# Patient Record
Sex: Female | Born: 1945 | Race: White | Hispanic: No | Marital: Married | Smoking: Current some day smoker
Health system: Southern US, Community
[De-identification: ages and names within clinical notes are randomized; demographics above are authoritative.]

## PROBLEM LIST (undated history)

## (undated) ENCOUNTER — Emergency Department (HOSPITAL_COMMUNITY): Discharge status: Absent without leave

## (undated) DIAGNOSIS — R29898 Other symptoms and signs involving the musculoskeletal system: Secondary | ICD-10-CM

## (undated) DIAGNOSIS — G8929 Other chronic pain: Secondary | ICD-10-CM

## (undated) DIAGNOSIS — I350 Nonrheumatic aortic (valve) stenosis: Secondary | ICD-10-CM

## (undated) DIAGNOSIS — I872 Venous insufficiency (chronic) (peripheral): Secondary | ICD-10-CM

## (undated) DIAGNOSIS — R0989 Other specified symptoms and signs involving the circulatory and respiratory systems: Secondary | ICD-10-CM

## (undated) DIAGNOSIS — M1990 Unspecified osteoarthritis, unspecified site: Secondary | ICD-10-CM

## (undated) DIAGNOSIS — R162 Hepatomegaly with splenomegaly, not elsewhere classified: Secondary | ICD-10-CM

## (undated) DIAGNOSIS — M48061 Spinal stenosis, lumbar region without neurogenic claudication: Secondary | ICD-10-CM

## (undated) DIAGNOSIS — B379 Candidiasis, unspecified: Secondary | ICD-10-CM

## (undated) DIAGNOSIS — J9690 Respiratory failure, unspecified, unspecified whether with hypoxia or hypercapnia: Secondary | ICD-10-CM

## (undated) DIAGNOSIS — D696 Thrombocytopenia, unspecified: Secondary | ICD-10-CM

## (undated) DIAGNOSIS — I739 Peripheral vascular disease, unspecified: Secondary | ICD-10-CM

## (undated) DIAGNOSIS — J449 Chronic obstructive pulmonary disease, unspecified: Secondary | ICD-10-CM

## (undated) DIAGNOSIS — R7881 Bacteremia: Secondary | ICD-10-CM

## (undated) DIAGNOSIS — I509 Heart failure, unspecified: Secondary | ICD-10-CM

## (undated) DIAGNOSIS — I5032 Chronic diastolic (congestive) heart failure: Secondary | ICD-10-CM

## (undated) DIAGNOSIS — F329 Major depressive disorder, single episode, unspecified: Secondary | ICD-10-CM

## (undated) DIAGNOSIS — E119 Type 2 diabetes mellitus without complications: Secondary | ICD-10-CM

## (undated) DIAGNOSIS — E876 Hypokalemia: Secondary | ICD-10-CM

## (undated) DIAGNOSIS — I10 Essential (primary) hypertension: Secondary | ICD-10-CM

## (undated) DIAGNOSIS — I469 Cardiac arrest, cause unspecified: Secondary | ICD-10-CM

## (undated) DIAGNOSIS — E039 Hypothyroidism, unspecified: Secondary | ICD-10-CM

## (undated) DIAGNOSIS — Z515 Encounter for palliative care: Secondary | ICD-10-CM

## (undated) DIAGNOSIS — K7460 Unspecified cirrhosis of liver: Secondary | ICD-10-CM

## (undated) DIAGNOSIS — Z9889 Other specified postprocedural states: Secondary | ICD-10-CM

## (undated) DIAGNOSIS — R609 Edema, unspecified: Secondary | ICD-10-CM

## (undated) DIAGNOSIS — F32A Depression, unspecified: Secondary | ICD-10-CM

## (undated) HISTORY — DX: Thrombocytopenia, unspecified: D69.6

## (undated) HISTORY — DX: Unspecified osteoarthritis, unspecified site: M19.90

## (undated) HISTORY — DX: Other specified symptoms and signs involving the circulatory and respiratory systems: R09.89

## (undated) HISTORY — PX: ROTATOR CUFF REPAIR: SHX139

## (undated) HISTORY — DX: Respiratory failure, unspecified, unspecified whether with hypoxia or hypercapnia: J96.90

## (undated) HISTORY — PX: THYROIDECTOMY: SHX17

## (undated) HISTORY — DX: Chronic obstructive pulmonary disease, unspecified: J44.9

## (undated) HISTORY — DX: Nonrheumatic aortic (valve) stenosis: I35.0

## (undated) HISTORY — DX: Major depressive disorder, single episode, unspecified: F32.9

## (undated) HISTORY — PX: LAMINECTOMY: SHX219

## (undated) HISTORY — DX: Depression, unspecified: F32.A

## (undated) HISTORY — PX: VAGINAL HYSTERECTOMY: SUR661

## (undated) HISTORY — PX: LITHOTRIPSY: SUR834

## (undated) HISTORY — DX: Hypokalemia: E87.6

## (undated) HISTORY — DX: Edema, unspecified: R60.9

## (undated) HISTORY — DX: Venous insufficiency (chronic) (peripheral): I87.2

## (undated) HISTORY — PX: BREAST CYST EXCISION: SHX579

## (undated) HISTORY — PX: LAPAROSCOPIC CHOLECYSTECTOMY: SUR755

## (undated) HISTORY — DX: Hypothyroidism, unspecified: E03.9

---

## 1997-11-12 ENCOUNTER — Ambulatory Visit (HOSPITAL_COMMUNITY): Admission: RE | Admit: 1997-11-12 | Discharge: 1997-11-12 | Payer: Self-pay | Admitting: Orthopedic Surgery

## 1997-11-12 ENCOUNTER — Encounter: Payer: Self-pay | Admitting: Orthopedic Surgery

## 1997-11-29 ENCOUNTER — Ambulatory Visit (HOSPITAL_COMMUNITY): Admission: RE | Admit: 1997-11-29 | Discharge: 1997-11-29 | Payer: Self-pay | Admitting: Orthopedic Surgery

## 1997-11-29 ENCOUNTER — Encounter: Payer: Self-pay | Admitting: Orthopedic Surgery

## 1997-12-20 ENCOUNTER — Ambulatory Visit (HOSPITAL_BASED_OUTPATIENT_CLINIC_OR_DEPARTMENT_OTHER): Admission: RE | Admit: 1997-12-20 | Discharge: 1997-12-20 | Payer: Self-pay | Admitting: Orthopedic Surgery

## 1998-01-02 ENCOUNTER — Encounter: Admission: RE | Admit: 1998-01-02 | Discharge: 1998-03-15 | Payer: Self-pay | Admitting: Orthopedic Surgery

## 1998-04-12 ENCOUNTER — Encounter: Payer: Self-pay | Admitting: Neurosurgery

## 1998-04-16 ENCOUNTER — Encounter: Payer: Self-pay | Admitting: Neurosurgery

## 1998-04-16 ENCOUNTER — Inpatient Hospital Stay (HOSPITAL_COMMUNITY): Admission: RE | Admit: 1998-04-16 | Discharge: 1998-04-17 | Payer: Self-pay | Admitting: Neurosurgery

## 1998-04-30 ENCOUNTER — Inpatient Hospital Stay (HOSPITAL_COMMUNITY): Admission: AD | Admit: 1998-04-30 | Discharge: 1998-05-02 | Payer: Self-pay | Admitting: Neurosurgery

## 1998-05-01 ENCOUNTER — Encounter: Payer: Self-pay | Admitting: Neurosurgery

## 1998-05-02 ENCOUNTER — Encounter: Payer: Self-pay | Admitting: Neurosurgery

## 1998-06-10 ENCOUNTER — Inpatient Hospital Stay (HOSPITAL_COMMUNITY): Admission: EM | Admit: 1998-06-10 | Discharge: 1998-06-21 | Payer: Self-pay | Admitting: Emergency Medicine

## 1998-06-14 ENCOUNTER — Encounter: Payer: Self-pay | Admitting: *Deleted

## 1998-06-16 ENCOUNTER — Encounter: Payer: Self-pay | Admitting: *Deleted

## 1998-08-02 ENCOUNTER — Ambulatory Visit (HOSPITAL_COMMUNITY): Admission: RE | Admit: 1998-08-02 | Discharge: 1998-08-02 | Payer: Self-pay | Admitting: Family Medicine

## 1998-09-19 ENCOUNTER — Encounter: Admission: RE | Admit: 1998-09-19 | Discharge: 1998-12-12 | Payer: Self-pay | Admitting: Anesthesiology

## 1998-11-11 ENCOUNTER — Ambulatory Visit (HOSPITAL_COMMUNITY): Admission: RE | Admit: 1998-11-11 | Discharge: 1998-11-11 | Payer: Self-pay | Admitting: Anesthesiology

## 1998-11-11 ENCOUNTER — Encounter: Payer: Self-pay | Admitting: Anesthesiology

## 1998-12-12 ENCOUNTER — Encounter: Admission: RE | Admit: 1998-12-12 | Discharge: 1999-03-08 | Payer: Self-pay | Admitting: Anesthesiology

## 1998-12-17 ENCOUNTER — Encounter: Admission: RE | Admit: 1998-12-17 | Discharge: 1998-12-17 | Payer: Self-pay | Admitting: Gastroenterology

## 1998-12-17 ENCOUNTER — Encounter: Payer: Self-pay | Admitting: Gastroenterology

## 1999-02-01 ENCOUNTER — Other Ambulatory Visit: Admission: RE | Admit: 1999-02-01 | Discharge: 1999-02-01 | Payer: Self-pay | Admitting: Gastroenterology

## 1999-03-08 ENCOUNTER — Encounter: Admission: RE | Admit: 1999-03-08 | Discharge: 1999-05-10 | Payer: Self-pay | Admitting: Anesthesiology

## 1999-04-12 ENCOUNTER — Encounter: Admission: RE | Admit: 1999-04-12 | Discharge: 1999-04-12 | Payer: Self-pay | Admitting: *Deleted

## 1999-06-10 ENCOUNTER — Encounter: Payer: Self-pay | Admitting: Surgery

## 1999-06-11 ENCOUNTER — Encounter: Admission: RE | Admit: 1999-06-11 | Discharge: 1999-09-09 | Payer: Self-pay | Admitting: Anesthesiology

## 1999-06-12 ENCOUNTER — Encounter (INDEPENDENT_AMBULATORY_CARE_PROVIDER_SITE_OTHER): Payer: Self-pay | Admitting: Specialist

## 1999-06-12 ENCOUNTER — Encounter: Payer: Self-pay | Admitting: Surgery

## 1999-06-13 ENCOUNTER — Encounter: Payer: Self-pay | Admitting: Surgery

## 1999-06-13 ENCOUNTER — Inpatient Hospital Stay (HOSPITAL_COMMUNITY): Admission: EM | Admit: 1999-06-13 | Discharge: 1999-06-15 | Payer: Self-pay | Admitting: Surgery

## 1999-09-09 ENCOUNTER — Encounter: Admission: RE | Admit: 1999-09-09 | Discharge: 1999-12-08 | Payer: Self-pay | Admitting: Anesthesiology

## 1999-09-17 ENCOUNTER — Encounter: Admission: RE | Admit: 1999-09-17 | Discharge: 1999-09-17 | Payer: Self-pay | Admitting: Family Medicine

## 1999-09-17 ENCOUNTER — Encounter: Payer: Self-pay | Admitting: Family Medicine

## 1999-12-03 ENCOUNTER — Encounter: Admission: RE | Admit: 1999-12-03 | Discharge: 2000-03-02 | Payer: Self-pay | Admitting: Anesthesiology

## 2000-03-26 ENCOUNTER — Encounter: Admission: RE | Admit: 2000-03-26 | Discharge: 2000-06-24 | Payer: Self-pay | Admitting: Anesthesiology

## 2000-08-10 ENCOUNTER — Encounter: Admission: RE | Admit: 2000-08-10 | Discharge: 2000-10-24 | Payer: Self-pay | Admitting: Anesthesiology

## 2000-11-04 ENCOUNTER — Encounter: Payer: Self-pay | Admitting: Family Medicine

## 2000-11-04 ENCOUNTER — Encounter: Admission: RE | Admit: 2000-11-04 | Discharge: 2000-11-04 | Payer: Self-pay | Admitting: Orthopedic Surgery

## 2001-07-07 ENCOUNTER — Encounter: Admission: RE | Admit: 2001-07-07 | Discharge: 2001-07-07 | Payer: Self-pay | Admitting: Family Medicine

## 2001-07-07 ENCOUNTER — Encounter: Payer: Self-pay | Admitting: Family Medicine

## 2001-07-21 ENCOUNTER — Encounter: Payer: Self-pay | Admitting: Family Medicine

## 2001-07-21 ENCOUNTER — Encounter: Admission: RE | Admit: 2001-07-21 | Discharge: 2001-07-21 | Payer: Self-pay | Admitting: Family Medicine

## 2001-08-03 ENCOUNTER — Encounter: Admission: RE | Admit: 2001-08-03 | Discharge: 2001-08-03 | Payer: Self-pay | Admitting: Family Medicine

## 2001-08-03 ENCOUNTER — Encounter: Payer: Self-pay | Admitting: Family Medicine

## 2002-01-11 ENCOUNTER — Encounter: Admission: RE | Admit: 2002-01-11 | Discharge: 2002-01-11 | Payer: Self-pay | Admitting: Family Medicine

## 2002-01-11 ENCOUNTER — Encounter: Payer: Self-pay | Admitting: Family Medicine

## 2002-08-11 ENCOUNTER — Ambulatory Visit (HOSPITAL_COMMUNITY): Admission: RE | Admit: 2002-08-11 | Discharge: 2002-08-11 | Payer: Self-pay | Admitting: Family Medicine

## 2003-07-19 ENCOUNTER — Encounter: Admission: RE | Admit: 2003-07-19 | Discharge: 2003-07-19 | Payer: Self-pay | Admitting: Family Medicine

## 2005-06-06 ENCOUNTER — Ambulatory Visit: Payer: Self-pay | Admitting: Internal Medicine

## 2005-06-09 ENCOUNTER — Ambulatory Visit: Payer: Self-pay | Admitting: Cardiovascular Disease

## 2005-06-12 ENCOUNTER — Ambulatory Visit: Admission: RE | Admit: 2005-06-12 | Discharge: 2005-06-12 | Payer: Self-pay | Admitting: Cardiovascular Disease

## 2005-06-12 LAB — PULMONARY FUNCTION TEST

## 2005-11-12 ENCOUNTER — Encounter: Admission: RE | Admit: 2005-11-12 | Discharge: 2005-11-12 | Payer: Self-pay | Admitting: Family Medicine

## 2006-04-22 ENCOUNTER — Ambulatory Visit: Payer: Self-pay | Admitting: Vascular Surgery

## 2006-04-22 ENCOUNTER — Ambulatory Visit: Admission: RE | Admit: 2006-04-22 | Discharge: 2006-04-22 | Payer: Self-pay | Admitting: Family Medicine

## 2007-09-29 ENCOUNTER — Ambulatory Visit: Admission: RE | Admit: 2007-09-29 | Discharge: 2007-09-29 | Payer: Self-pay | Admitting: Gynecology

## 2007-10-31 ENCOUNTER — Encounter: Admission: RE | Admit: 2007-10-31 | Discharge: 2007-10-31 | Payer: Self-pay | Admitting: Orthopedic Surgery

## 2007-11-05 ENCOUNTER — Encounter (INDEPENDENT_AMBULATORY_CARE_PROVIDER_SITE_OTHER): Payer: Self-pay | Admitting: Orthopedic Surgery

## 2007-11-05 ENCOUNTER — Ambulatory Visit (HOSPITAL_COMMUNITY): Admission: RE | Admit: 2007-11-05 | Discharge: 2007-11-05 | Payer: Self-pay | Admitting: Orthopedic Surgery

## 2007-11-05 ENCOUNTER — Ambulatory Visit: Payer: Self-pay | Admitting: Vascular Surgery

## 2007-11-26 ENCOUNTER — Ambulatory Visit: Payer: Self-pay | Admitting: Cardiovascular Disease

## 2007-12-03 ENCOUNTER — Ambulatory Visit: Payer: Self-pay

## 2007-12-03 ENCOUNTER — Encounter: Payer: Self-pay | Admitting: Cardiovascular Disease

## 2007-12-06 ENCOUNTER — Ambulatory Visit: Payer: Self-pay

## 2009-01-14 ENCOUNTER — Ambulatory Visit: Payer: Self-pay | Admitting: Internal Medicine

## 2009-01-14 ENCOUNTER — Encounter (INDEPENDENT_AMBULATORY_CARE_PROVIDER_SITE_OTHER): Payer: Self-pay | Admitting: Internal Medicine

## 2009-01-14 ENCOUNTER — Inpatient Hospital Stay (HOSPITAL_COMMUNITY): Admission: EM | Admit: 2009-01-14 | Discharge: 2009-01-26 | Payer: Self-pay | Admitting: Emergency Medicine

## 2009-02-05 ENCOUNTER — Encounter: Admission: RE | Admit: 2009-02-05 | Discharge: 2009-02-05 | Payer: Self-pay | Admitting: Family Medicine

## 2009-02-06 DIAGNOSIS — E039 Hypothyroidism, unspecified: Secondary | ICD-10-CM | POA: Insufficient documentation

## 2009-02-06 DIAGNOSIS — M129 Arthropathy, unspecified: Secondary | ICD-10-CM | POA: Insufficient documentation

## 2009-02-06 DIAGNOSIS — J449 Chronic obstructive pulmonary disease, unspecified: Secondary | ICD-10-CM

## 2009-02-06 DIAGNOSIS — D696 Thrombocytopenia, unspecified: Secondary | ICD-10-CM | POA: Insufficient documentation

## 2009-02-06 DIAGNOSIS — F329 Major depressive disorder, single episode, unspecified: Secondary | ICD-10-CM

## 2009-02-06 DIAGNOSIS — I872 Venous insufficiency (chronic) (peripheral): Secondary | ICD-10-CM | POA: Insufficient documentation

## 2009-02-06 DIAGNOSIS — R609 Edema, unspecified: Secondary | ICD-10-CM | POA: Insufficient documentation

## 2009-02-06 DIAGNOSIS — I359 Nonrheumatic aortic valve disorder, unspecified: Secondary | ICD-10-CM | POA: Insufficient documentation

## 2009-02-06 DIAGNOSIS — J4489 Other specified chronic obstructive pulmonary disease: Secondary | ICD-10-CM | POA: Insufficient documentation

## 2009-02-06 DIAGNOSIS — E876 Hypokalemia: Secondary | ICD-10-CM

## 2009-02-06 DIAGNOSIS — R0989 Other specified symptoms and signs involving the circulatory and respiratory systems: Secondary | ICD-10-CM | POA: Insufficient documentation

## 2009-02-07 ENCOUNTER — Encounter: Payer: Self-pay | Admitting: Cardiovascular Disease

## 2009-02-07 ENCOUNTER — Ambulatory Visit: Payer: Self-pay | Admitting: Cardiovascular Disease

## 2009-02-07 DIAGNOSIS — I2789 Other specified pulmonary heart diseases: Secondary | ICD-10-CM

## 2009-02-08 LAB — CONVERTED CEMR LAB
BUN: 11 mg/dL (ref 6–23)
Basophils Absolute: 0 10*3/uL (ref 0.0–0.1)
CO2: 34 meq/L — ABNORMAL HIGH (ref 19–32)
Chloride: 98 meq/L (ref 96–112)
Glucose, Bld: 109 mg/dL — ABNORMAL HIGH (ref 70–99)
HCT: 37.5 % (ref 36.0–46.0)
Lymphs Abs: 1.2 10*3/uL (ref 0.7–4.0)
MCHC: 34 g/dL (ref 30.0–36.0)
MCV: 93.8 fL (ref 78.0–100.0)
Monocytes Absolute: 0.3 10*3/uL (ref 0.1–1.0)
Platelets: 92 10*3/uL — ABNORMAL LOW (ref 150.0–400.0)
Potassium: 2.7 meq/L — CL (ref 3.5–5.1)
Prothrombin Time: 11.6 s (ref 9.1–11.7)
RDW: 14.4 % (ref 11.5–14.6)

## 2009-02-12 ENCOUNTER — Inpatient Hospital Stay (HOSPITAL_BASED_OUTPATIENT_CLINIC_OR_DEPARTMENT_OTHER): Admission: RE | Admit: 2009-02-12 | Discharge: 2009-02-12 | Payer: Self-pay | Admitting: Cardiology

## 2009-02-12 ENCOUNTER — Ambulatory Visit: Payer: Self-pay | Admitting: Cardiovascular Disease

## 2009-02-12 ENCOUNTER — Ambulatory Visit: Payer: Self-pay | Admitting: Cardiology

## 2009-03-21 ENCOUNTER — Encounter (INDEPENDENT_AMBULATORY_CARE_PROVIDER_SITE_OTHER): Payer: Self-pay | Admitting: *Deleted

## 2009-05-19 ENCOUNTER — Encounter: Payer: Self-pay | Admitting: Cardiovascular Disease

## 2009-05-19 ENCOUNTER — Encounter: Admission: RE | Admit: 2009-05-19 | Discharge: 2009-05-19 | Payer: Self-pay | Admitting: Family Medicine

## 2009-06-27 ENCOUNTER — Encounter: Payer: Self-pay | Admitting: Cardiovascular Disease

## 2009-06-28 ENCOUNTER — Ambulatory Visit: Payer: Self-pay | Admitting: Cardiovascular Disease

## 2009-06-28 ENCOUNTER — Ambulatory Visit: Payer: Self-pay

## 2009-06-28 DIAGNOSIS — I509 Heart failure, unspecified: Secondary | ICD-10-CM | POA: Insufficient documentation

## 2009-07-03 LAB — CONVERTED CEMR LAB
BUN: 19 mg/dL (ref 6–23)
CO2: 34 meq/L — ABNORMAL HIGH (ref 19–32)
Calcium: 9.2 mg/dL (ref 8.4–10.5)
Creatinine, Ser: 0.9 mg/dL (ref 0.4–1.2)
Glucose, Bld: 94 mg/dL (ref 70–99)

## 2009-07-18 ENCOUNTER — Telehealth: Payer: Self-pay | Admitting: Cardiovascular Disease

## 2009-07-19 ENCOUNTER — Ambulatory Visit: Payer: Self-pay | Admitting: Pulmonary Disease

## 2009-07-19 DIAGNOSIS — R0609 Other forms of dyspnea: Secondary | ICD-10-CM

## 2009-07-19 DIAGNOSIS — R0989 Other specified symptoms and signs involving the circulatory and respiratory systems: Secondary | ICD-10-CM

## 2009-07-20 ENCOUNTER — Ambulatory Visit: Admission: RE | Admit: 2009-07-20 | Discharge: 2009-07-20 | Payer: Self-pay | Admitting: Pulmonary Disease

## 2009-07-20 ENCOUNTER — Ambulatory Visit: Payer: Self-pay | Admitting: Pulmonary Disease

## 2009-07-20 DIAGNOSIS — J438 Other emphysema: Secondary | ICD-10-CM | POA: Insufficient documentation

## 2009-08-22 ENCOUNTER — Encounter: Payer: Self-pay | Admitting: Pulmonary Disease

## 2009-12-13 ENCOUNTER — Emergency Department (HOSPITAL_COMMUNITY): Admission: EM | Admit: 2009-12-13 | Discharge: 2009-12-13 | Payer: Self-pay | Admitting: Emergency Medicine

## 2009-12-25 ENCOUNTER — Ambulatory Visit: Payer: Self-pay | Admitting: Cardiovascular Disease

## 2009-12-25 DIAGNOSIS — G459 Transient cerebral ischemic attack, unspecified: Secondary | ICD-10-CM | POA: Insufficient documentation

## 2010-03-28 ENCOUNTER — Encounter: Payer: Self-pay | Admitting: Cardiovascular Disease

## 2010-03-28 ENCOUNTER — Ambulatory Visit (INDEPENDENT_AMBULATORY_CARE_PROVIDER_SITE_OTHER): Payer: Self-pay | Admitting: Cardiovascular Disease

## 2010-03-28 ENCOUNTER — Ambulatory Visit (HOSPITAL_COMMUNITY): Payer: Self-pay | Attending: Cardiovascular Disease

## 2010-03-28 ENCOUNTER — Encounter (INDEPENDENT_AMBULATORY_CARE_PROVIDER_SITE_OTHER): Payer: Self-pay

## 2010-03-28 ENCOUNTER — Ambulatory Visit: Admit: 2010-03-28 | Payer: Self-pay

## 2010-03-28 ENCOUNTER — Ambulatory Visit: Admit: 2010-03-28 | Payer: Self-pay | Admitting: Cardiovascular Disease

## 2010-03-28 DIAGNOSIS — I359 Nonrheumatic aortic valve disorder, unspecified: Secondary | ICD-10-CM

## 2010-03-28 DIAGNOSIS — I079 Rheumatic tricuspid valve disease, unspecified: Secondary | ICD-10-CM | POA: Insufficient documentation

## 2010-03-28 DIAGNOSIS — I739 Peripheral vascular disease, unspecified: Secondary | ICD-10-CM

## 2010-03-28 DIAGNOSIS — I509 Heart failure, unspecified: Secondary | ICD-10-CM | POA: Insufficient documentation

## 2010-03-28 DIAGNOSIS — F17200 Nicotine dependence, unspecified, uncomplicated: Secondary | ICD-10-CM | POA: Insufficient documentation

## 2010-03-28 DIAGNOSIS — J4489 Other specified chronic obstructive pulmonary disease: Secondary | ICD-10-CM | POA: Insufficient documentation

## 2010-03-28 DIAGNOSIS — I6529 Occlusion and stenosis of unspecified carotid artery: Secondary | ICD-10-CM

## 2010-03-28 DIAGNOSIS — R0989 Other specified symptoms and signs involving the circulatory and respiratory systems: Secondary | ICD-10-CM | POA: Insufficient documentation

## 2010-03-28 DIAGNOSIS — R0609 Other forms of dyspnea: Secondary | ICD-10-CM | POA: Insufficient documentation

## 2010-03-28 DIAGNOSIS — I379 Nonrheumatic pulmonary valve disorder, unspecified: Secondary | ICD-10-CM | POA: Insufficient documentation

## 2010-03-28 DIAGNOSIS — J449 Chronic obstructive pulmonary disease, unspecified: Secondary | ICD-10-CM | POA: Insufficient documentation

## 2010-03-28 DIAGNOSIS — I080 Rheumatic disorders of both mitral and aortic valves: Secondary | ICD-10-CM | POA: Insufficient documentation

## 2010-03-28 DIAGNOSIS — R609 Edema, unspecified: Secondary | ICD-10-CM

## 2010-03-28 NOTE — Miscellaneous (Signed)
Summary: Orders Update  Clinical Lists Changes  Orders: Added new Test order of Carotid Duplex (Carotid Duplex) - Signed 

## 2010-03-28 NOTE — Assessment & Plan Note (Signed)
Summary: EPH/ F/U CAROTID AT 2 P.M. / MEDICARE/ GD  Medications Added METOPROLOL SUCCINATE 50 MG XR24H-TAB (METOPROLOL SUCCINATE) Take one-half  tablet by mouth daily FUROSEMIDE 80 MG TABS (FUROSEMIDE) Take one tablet by mouth daily in the morning and take 1/2 tablet ( 40mg ) in the evening POTASSIUM CHLORIDE CR 10 MEQ CR-CAPS (POTASSIUM CHLORIDE) Take 2 tablets in the morning and 1 tablet in the evening. LEVOTHROID 150 MCG TABS (LEVOTHYROXINE SODIUM) 1 tab once daily PROZAC 20 MG CAPS (FLUOXETINE HCL) 1 tab once daily ASPIRIN 81 MG TBEC (ASPIRIN) Take one tablet by mouth daily DRISDOL [address] UNIT CAPS (ERGOCALCIFEROL) 1 tab q 2 weeks OXYCONTIN 80 MG XR12H-TAB (OXYCODONE HCL) take 1 tabs q 12 hours      Allergies Added: ! PCN  Visit Type:  Follow-up Primary Provider:  Dr Jeannetta Nap  CC:  edema.  History of Present Illness: [name] is seen today at the request of Dr Yetta Barre.  She has spinal stenosis and difficulty ambulating.  He is considering back surgery.  She was just cathed by Dr Juanda Chance.  Although she has no CAD she does have diastolic dysfunction with moderate AS and elevated filling pressures.  She needs her Lasix increased.  She had carotid duplex today which showed no critical carotid stenosis.  She is clear to have back surgery from a cardiac perspective.  Howeve she needs to be cleared by pulmonary first.  She continues to smoke.  I counseled her on quitting for less than 10 minutes.  She has a life threatenign respitory failure episode last winter and was cared for on a ventilator by our CCM team.  Frankly her lungs may be prohibitive for surgery especially if the likelyhood of helping her neuropathy and ability to ambulate with back surgery is limited.  We will try to expedite pulmonary F/U so they can communicate with Dr Yetta Barre about possible surgery.  She will have BMET and BNP today and F/U labs in 5-6 weeks on higher dose diuretic.  Current Problems (verified): 1)  Hypertension,  Pulmonary  (ICD-416.8) 2)  Carotid Bruit  (ICD-785.9) 3)  Aortic Stenosis  (ICD-424.1) 4)  Venous Insufficiency  (ICD-459.81) 5)  Arthritis  (ICD-716.90) 6)  Depression  (ICD-311) 7)  COPD  (ICD-496) 8)  Hypothyroidism  (ICD-244.9) 9)  Thrombocytopenia  (ICD-287.5) 10)  Hypokalemia  (ICD-276.8) 11)  Edema  (ICD-782.3)  Current Medications (verified): 1)  Metoprolol Succinate 50 Mg Xr24h-Tab (Metoprolol Succinate) .... Take One-Half  Tablet By Mouth Daily 2)  Furosemide 80 Mg Tabs (Furosemide) .... Take One Tablet By Mouth Daily in The Morning and Take 1/2 Tablet ( 40mg ) in The Evening 3)  Potassium Chloride Cr 10 Meq Cr-Caps (Potassium Chloride) .... Take 2 Tablets in The Morning and 1 Tablet in The Evening. 4)  Levothroid 150 Mcg Tabs (Levothyroxine Sodium) .Marland Kitchen.. 1 Tab Once Daily 5)  Prozac 20 Mg Caps (Fluoxetine Hcl) .Marland Kitchen.. 1 Tab Once Daily 6)  Aspirin 81 Mg Tbec (Aspirin) .... Take One Tablet By Mouth Daily 7)  Drisdol 60454 Unit Caps (Ergocalciferol) .Marland Kitchen.. 1 Tab Q 2 Weeks 8)  Oxycontin 80 Mg Xr12h-Tab (Oxycodone Hcl) .... Take 1 Tabs Q 12 Hours  Allergies (verified): 1)  ! Pcn  Past History:  Past Medical History: Last updated: 02/06/2009 Current Problems:  Respitory Arrest:  vent elevated CO2 01/2009 CAROTID BRUIT (ICD-785.9) 40-59% bilateral ICA 11/2007 AORTIC STENOSIS (ICD-424.1) moderate echo 01/2009  low risk myovue 11/2007 brest attenuation EF 63% VENOUS INSUFFICIENCY (ICD-459.81) ARTHRITIS (ICD-716.90) DEPRESSION (ICD-311) COPD (  ICD-496) HYPOTHYROIDISM (ICD-244.9) THROMBOCYTOPENIA (ICD-287.5) HYPOKALEMIA (ICD-276.8) EDEMA (ICD-782.3) Ventilator-dependent respiratory failure secondary to hypercarbia  and cardiac arrest  Past Surgical History: Last updated: 02/06/2009   Thyroidectomy,  laparoscopic cholecystectomy,   lithotripsy for nephrolithiasis  vaginal hysterectomy for menorrhagia,   excision of benign left breast cyst  right rotator cuff surgery,    cesarean section through a midline incision (twice),   lumbar  laminectomy.      Family History: Last updated: 02/06/2009  Her father had a coronary artery bypass grafting.  Social History: Last updated: 02/06/2009   The patient lives at home with her husband.  She denies   continuing to actively use tobacco.  She denies any alcohol or drug use.      Review of Systems       Denies fever, malais, weight loss, blurry vision, decreased visual acuity, cough, sputum, SOB, hemoptysis, pleuritic pain, palpitaitons, heartburn, abdominal pain, melena, lower extremity edema, claudication, or rash.   Vital Signs:  Patient profile:   65 year old female Height:      60 inches Weight:      204 pounds Pulse rate:   54 / minute BP sitting:   115 / 54  (left arm) Cuff size:   large  Vitals Entered By: Burnett Kanaris, CNA (Jun 28, 2009 3:09 PM)  Physical Exam  General:  Affect appropriate Healthy:  appears stated age HEENT: normal Neck supple with no adenopathy JVP normal no bruits no thyromegaly Lungs diffuse wheezing and rhonchi  and good diaphragmatic motion Heart:  S1/S2 AS  murmur, no rub, gallop or click PMI normal Abdomen: benighn, BS positve, no tenderness, no AAA no bruit.  No HSM or HJR Distal pulses intact with no bruits Plus one edema Neuro non-focal Skin warm and dry    Impression & Recommendations:  Problem # 1:  HYPERTENSION, PULMONARY (ICD-416.8) Well contorlled  Problem # 2:  CAROTID BRUIT (ICD-785.9) No disease by duplex primarily transmitted murmur  Problem # 3:  PREOPERATIVE EXAMINATION (ICD-V72.84) Clear from cardiac perspective.  Recent cath with no CAD normal EF and only moderate AS  Problem # 5:  CONGESTIVE HEART FAILURE UNSPECIFIED (ICD-428.0) Diastolic.  Increase lasix folllow BNP and lytes.   The following medications were removed from the medication list:    Furosemide 80 Mg Tabs (Furosemide) .Marland Kitchen... Take one tablet by mouth bid Her updated  medication list for this problem includes:    Metoprolol Succinate 50 Mg Xr24h-tab (Metoprolol succinate) .Marland Kitchen... Take one-half  tablet by mouth daily    Furosemide 80 Mg Tabs (Furosemide) .Marland Kitchen... Take one tablet by mouth daily in the morning and take 1/2 tablet ( 40mg ) in the evening    Aspirin 81 Mg Tbec (Aspirin) .Marland Kitchen... Take one tablet by mouth daily  Problem # 6:  COPD (ICD-496) F/U pulmonary for surgical clearance and Rx.  Discussed smoking cessation at length  Other Orders: Pulmonary Referral (Pulmonary) TLB-BMP (Basic Metabolic Panel-BMET) (80048-METABOL) TLB-BNP (B-Natriuretic Peptide) (83880-BNPR)  Patient Instructions: 1)  You have been referred to pulmonary for pre-op clearance. 2)  Your physician recommends that you schedule a follow-up appointment in: Dr. Eden Emms in 5-6 weeks.Marland Kitchen 3)  Your physician recommends that you have lab work today: BMET, BNP 4)  Your physician has recommended you make the following change in your medication: Increase Lasix to 80 mg in the morning and      40 mg in the evening. 5)  Potassium in the morning and 10 meq in the evening.  Prescriptions: POTASSIUM CHLORIDE CR 10 MEQ CR-CAPS (POTASSIUM CHLORIDE) Take 2 tablets in the morning and 1 tablet in the evening.  #90 x 6   Entered by:   Lisabeth Devoid RN   Authorized by:   Colon Branch, MD, [facility]   Signed by:   Lisabeth Devoid RN on 06/28/2009   Method used:   Electronically to        [facility]* (retail)       [address] [place], [place]  [address]       Ph: [phone] or [phone]       Fax: [phone]   RxID:   [id number] FUROSEMIDE 80 MG TABS (FUROSEMIDE) Take one tablet by mouth daily in the morning and take 1/2 tablet ( 40mg ) in the evening  #45 x 6   Entered by:   Lisabeth Devoid RN   Authorized by:   Colon Branch, MD, [facility]   Signed by:   Lisabeth Devoid RN on 06/28/2009   Method used:   Electronically to         [facility]* (retail)       [address] [place], [place]  [address]       Ph: [phone] or [phone]       Fax: [phone]   RxID:   [id number]

## 2010-03-28 NOTE — Progress Notes (Signed)
Summary: pulmonary appt   Phone Note Call from Patient Call back at Home Phone [phone]   Caller: Patient Reason for Call: Talk to Nurse Summary of Call: has appt with Pulmonary dr tomorrow, wants to know if Dr Eden Emms request any test be done Initial call taken by: Migdalia Dk,  Jul 18, 2009 2:40 PM  Follow-up for Phone Call        spoke with pt, questions answered Deliah Goody, RN  Jul 18, 2009 4:34 PM

## 2010-03-28 NOTE — Assessment & Plan Note (Signed)
Summary: consult for dyspnea   Copy to:  Charlton Haws  Primary Provider/Referring Provider:  Dr Jeannetta Nap  CC:  Pulmonary Consult.  History of Present Illness: The pt is a 64y/o female who I have been asked to see for ?emphysema and doe.  She has had breathing issues for at least 5 years, and feels they are getting worse.  She describes a less than one block doe at moderate pace on flat ground, but is able to do some of her housework without getting sob.  She feels her mobility and exertional tolerance is more limited by her chronic back and leg issues with debility than anything else.  She does have a mild chronic cough with white to brown mucus, but also continues to smoke up to 2ppd.  She does have issues with LE edema, but states that her weight is down 30 pounds over the past one year.  The pt was hospitalized for a cardiopulmonary arrest the end of last year requiring mechanical ventilation, and was presumed secondary to "copd".  However, the pt has never had pfts to document airflow obstruction.  She did have an echo at the time which showed moderate AS, a moderately dilated LA, and a normal RV.  She underwent right and left heart cath in DEC last year, and showed normal coronaries and moderate AS.  She had a PA pressure of 53/28,and a PCWP of 22 initially that rose to 31 by the end of the procedure.  Her CO by thermo was 6.5.  By my calculations, this gives her a PVR of 2.2 wood units (the definition of pulmonary ARTERIAL htn is > 3 wood units for all investigational studies, along with PCWP < 15).  She has a h/o thyroidectomy, but her TSH has been ok per pt.    Preventive Screening-Counseling & Management  Alcohol-Tobacco     Smoking Status: current  Current Medications (verified): 1)  Metoprolol Succinate 50 Mg Xr24h-Tab (Metoprolol Succinate) .... Take One-Half  Tablet By Mouth Daily 2)  Furosemide 80 Mg Tabs (Furosemide) .... Take One Tablet By Mouth Daily in The Morning and Take 1/2  Tablet ( 40mg ) in The Evening 3)  Potassium Chloride Cr 10 Meq Cr-Caps (Potassium Chloride) .... Take 1 Tablet By Mouth Two Times A Day 4)  Levothroid 150 Mcg Tabs (Levothyroxine Sodium) .Marland Kitchen.. 1 Tab Once Daily 5)  Prozac 20 Mg Caps (Fluoxetine Hcl) .Marland Kitchen.. 1 Tab Once Daily 6)  Aspirin 81 Mg Tbec (Aspirin) .... Take One Tablet By Mouth Daily 7)  Drisdol [address] Unit Caps (Ergocalciferol) .Marland Kitchen.. 1 Tab Q 2 Weeks 8)  Morphine Er .... Take 2 Tabs Q 12 Hours  Allergies: 1)  ! Pcn 2)  ! * Latex  Past History:  Past Medical History: Reviewed history from 02/06/2009 and no changes required. Current Problems:  Respitory Arrest:  vent elevated CO2 01/2009 CAROTID BRUIT (ICD-785.9) 40-59% bilateral ICA 11/2007 AORTIC STENOSIS (ICD-424.1) moderate echo 01/2009  low risk myovue 11/2007 brest attenuation EF 63% VENOUS INSUFFICIENCY (ICD-459.81) ARTHRITIS (ICD-716.90) DEPRESSION (ICD-311) COPD (ICD-496) HYPOTHYROIDISM (ICD-244.9) THROMBOCYTOPENIA (ICD-287.5) HYPOKALEMIA (ICD-276.8) EDEMA (ICD-782.3) Ventilator-dependent respiratory failure secondary to hypercarbia  and cardiac arrest  Past Surgical History:   Thyroidectomy,  laparoscopic cholecystectomy 1990s  lithotripsy for nephrolithiasis  vaginal hysterectomy for menorrhagia  at age 72  excision of benign left breast cyst  1990s  right rotator cuff surgery,   cesarean section through a midline incision (twice),   lumbar  laminectomy.      Family History: Reviewed  history from 02/06/2009 and no changes required. emphysema: father allergies: sister asthma: sister heart disease: father rheumatism: mother cancer: mother (blood) , maternal aunt ( lung)   Social History: Reviewed history from 02/06/2009 and no changes required. pt is married. pt has children. pt is a housewife.  Patient is a current smoker.  started at age 46.  2 ppd.  Smoking Status:  current  Review of Systems       The patient complains of shortness of breath  with activity, shortness of breath at rest, productive cough, non-productive cough, acid heartburn, indigestion, loss of appetite, weight change, abdominal pain, difficulty swallowing, headaches, nasal congestion/difficulty breathing through nose, anxiety, depression, hand/feet swelling, and joint stiffness or pain.  The patient denies coughing up blood, chest pain, irregular heartbeats, sore throat, tooth/dental problems, sneezing, itching, ear ache, rash, change in color of mucus, and fever.    Vital Signs:  Patient profile:   65 year old female Height:      60 inches Weight:      203 pounds BMI:     39.79 O2 Sat:      96 % on Room air Temp:     98.1 degrees F oral Pulse rate:   84 / minute BP sitting:   172 / 82  (right arm) Cuff size:   regular  Vitals Entered By: Arman Filter LPN (Jul 19, 2009 3:17 PM)  O2 Flow:  Room air CC: Pulmonary Consult Comments Medications reviewed with patient Arman Filter LPN  Jul 19, 2009 3:25 PM    Physical Exam  General:  obese female in nad Eyes:  PERRLA and EOMI.   Nose:  deviated septum to left with narrowing Mouth:  moderate elongation of soft palate and uvula Neck:  no jvd, tmg, LN Lungs:  clear to auscultation, good airflow. Heart:  rrr, 3/6 sem. Abdomen:  soft and nontender, bs+ Extremities:  2+ edema with varicosities, no cyanosis difficult to palpate pulses distally. Neurologic:  alert and oriented, moves all 4.   Impression & Recommendations:  Problem # 1:  DYSPNEA ON EXERTION (ICD-786.09) the pt most likely has multifactorial doe.  She is obese, deconditioned, severe MSK complaints, has h/o volume overload with documented increased pcwp, moderate AS, and pulmonary venous htn with PVR < 3 wood units.  She also is a lifelong smoker and continues to smoke, and therefore may have significant underlying lung disease.  Will need full pfts to evaluate further.  I have counseled her on smoking cessation, weight reduction, and the need  for some type of physical activity.  Will see her back after pfts to evaluate whether she would benefit from maintenance bronchodilators.    Medications Added to Medication List This Visit: 1)  Potassium Chloride Cr 10 Meq Cr-caps (Potassium chloride) .... Take 1 tablet by mouth two times a day 2)  Morphine Er  .... Take 2 tabs q 12 hours  Other Orders: Consultation Level IV ([address]) Pulmonary Referral (Pulmonary) T-2 View CXR (71020TC)  Patient Instructions: 1)  will set up for breathing studies, and see you back either the same day or soon after. 2)  stop smoking.

## 2010-03-28 NOTE — Letter (Signed)
Summary: Appointment - Reminder 2  [facility], Main Office  [address]   [place], [place] [address]   Phone: [phone]  Fax: [phone]     March 21, 2009 MRN: [id number]   [name] [address] [place], [place]  [address]   Dear [name],  Our records indicate that it is time to schedule a follow-up appointment with Dr. Eden Emms. It is very important that we reach you to schedule this appointment. We look forward to participating in your health care needs. Please contact us at the number listed above at your earliest convenience to schedule your appointment.  If you are unable to make an appointment at this time, give [place] a call so we can update our records.   Sincerely,   Migdalia Dk [facility] Scheduling Team

## 2010-03-28 NOTE — Assessment & Plan Note (Signed)
Summary: PER CHECK OUT/SF  Medications Added * MORPHINE as directed POTASSIUM CHLORIDE CRYS CR 20 MEQ CR-TABS (POTASSIUM CHLORIDE CRYS CR) 2 tabs by mouth once daily OXYCONTIN 80 MG XR12H-TAB (OXYCODONE HCL) as directed      Allergies Added:   Referring Provider:  Charlton Haws  Primary Provider:  Dr Jeannetta Nap   History of Present Illness: [name] is seen today for F/U of AS, diastolic dysfunciton and dyspnea   She underwent right and left heart cath in 12/11, and showed normal coronaries and moderate AS.  She had a PA pressure of 53/28,and a PCWP of 22 initially that rose to 31 by the end of the procedure.  Her CO by thermo was 6.5.   PVR was elevated at 3.6 and gradients across the AV were peak of 27 and mean of 21.  She has had some visual problems with her right eye.  This is not likely form her heart.  She had carotids in 5/11 which I reviewed and only had 0-39% bilateral disease.  She could not tolerate an MRI and CT showed no stroke.  She has a F/U with eye doctor and no other focal signs.  She has spinal stenosis and back surgery has been deferred to to her comorbidities.  She denies SSCP, sycope, PND orthopnea and has been compliant with meds.    Current Problems (verified): 1)  Emphysema, Mild  (ICD-492.8) 2)  Dyspnea On Exertion  (ICD-786.09) 3)  Congestive Heart Failure Unspecified  (ICD-428.0) 4)  Preoperative Examination  (ICD-V72.84) 5)  Hypertension, Pulmonary  (ICD-416.8) 6)  Carotid Bruit  (ICD-785.9) 7)  Aortic Stenosis  (ICD-424.1) 8)  Venous Insufficiency  (ICD-459.81) 9)  Arthritis  (ICD-716.90) 10)  Depression  (ICD-311) 11)  COPD  (ICD-496) 12)  Hypothyroidism  (ICD-244.9) 13)  Thrombocytopenia  (ICD-287.5) 14)  Hypokalemia  (ICD-276.8) 15)  Edema  (ICD-782.3)  Current Medications (verified): 1)  Metoprolol Succinate 50 Mg Xr24h-Tab (Metoprolol Succinate) .... Take One-Half  Tablet By Mouth Daily 2)  Furosemide 80 Mg Tabs (Furosemide) .... Take One Tablet By  Mouth Daily in The Morning and Take 1/2 Tablet ( 40mg ) in The Evening 3)  Potassium Chloride Cr 10 Meq Cr-Caps (Potassium Chloride) .... Take 1 Tablet By Mouth Two Times A Day 4)  Levothroid 150 Mcg Tabs (Levothyroxine Sodium) .Marland Kitchen.. 1 Tab Once Daily 5)  Prozac 20 Mg Caps (Fluoxetine Hcl) .Marland Kitchen.. 1 Tab Once Daily 6)  Aspirin 81 Mg Tbec (Aspirin) .... Take One Tablet By Mouth Daily 7)  Drisdol [address] Unit Caps (Ergocalciferol) .Marland Kitchen.. 1 Tab Q 2 Weeks 8)  Morphine .... As Directed 9)  Potassium Chloride Crys Cr 20 Meq Cr-Tabs (Potassium Chloride Crys Cr) .... 2 Tabs By Mouth Once Daily 10)  Oxycontin 80 Mg Xr12h-Tab (Oxycodone Hcl) .... As Directed  Allergies (verified): 1)  ! Pcn 2)  ! * Latex  Past History:  Past Medical History: Last updated: 02/06/2009 Current Problems:  Respitory Arrest:  vent elevated CO2 01/2009 CAROTID BRUIT (ICD-785.9) 40-59% bilateral ICA 11/2007 AORTIC STENOSIS (ICD-424.1) moderate echo 01/2009  low risk myovue 11/2007 brest attenuation EF 63% VENOUS INSUFFICIENCY (ICD-459.81) ARTHRITIS (ICD-716.90) DEPRESSION (ICD-311) COPD (ICD-496) HYPOTHYROIDISM (ICD-244.9) THROMBOCYTOPENIA (ICD-287.5) HYPOKALEMIA (ICD-276.8) EDEMA (ICD-782.3) Ventilator-dependent respiratory failure secondary to hypercarbia  and cardiac arrest  Past Surgical History: Last updated: 07/19/2009   Thyroidectomy,  laparoscopic cholecystectomy 1990s  lithotripsy for nephrolithiasis  vaginal hysterectomy for menorrhagia  at age 73  excision of benign left breast cyst  1990s  right rotator cuff  surgery,   cesarean section through a midline incision (twice),   lumbar  laminectomy.      Family History: Last updated: 07/19/2009 emphysema: father allergies: sister asthma: sister heart disease: father rheumatism: mother cancer: mother (blood) , maternal aunt ( lung)   Social History: Last updated: 07/19/2009 pt is married. pt has children. pt is a housewife.  Patient is a current  smoker.  started at age 4.  2 ppd.   Review of Systems       Denies fever, malais, weight loss, blurry vision, decreased visual acuity, cough, sputum, , hemoptysis, pleuritic pain, palpitaitons, heartburn, abdominal pain, melena, lower extremity edema, claudication, or rash.   Vital Signs:  Patient profile:   65 year old female Height:      60 inches Weight:      203 pounds BMI:     39.79 Pulse rate:   80 / minute Resp:     14 per minute BP sitting:   120 / 68  (left arm)  Vitals Entered By: Kem Parkinson (December 25, 2009 3:19 PM)  Physical Exam  General:  Affect appropriate Healthy:  appears stated age HEENT: normal Neck supple with no adenopathy JVP normal referred AS murmur vs  bruits no thyromegaly Lungs clear with no wheezing and  Cardiac: Moderate AS murmur no rub, gallop or click PMI normal Abdomen: benighn, BS positve, no tenderness, no AAA no bruit.  No HSM or HJR Distal pulses intact with no bruits No edema Neuro non-focal Skin warm and dry    Impression & Recommendations:  Problem # 1:  TIA (ICD-435.9) Not clear what visual symtoms mean.  Not classic for amaurosis.  Carotids not that bad and AV not calcified much on angio.  F/U with neuro opthamologsit.  Consdier sedation with open MRI.  Can also consider empiric Agrenox.  F/U carotid duplex in 2/12  Problem # 2:  EMPHYSEMA, MILD (ICD-492.8) Reviewed Dr Shelle Iron note.  Continue curretn Rx.  Discussed smoking cessaton with her at length as the smoking will also accelerate the aortic stenosis.  She has been smoking since age 59.  Still over 2ppd.  Does not want Chantix.  Explained that Welbutrin, nucotrol inhaler, gum or patches are all alternatives.  Offered her referral to [facility].  Refused  Problem # 3:  CONGESTIVE HEART FAILURE UNSPECIFIED (ICD-428.0) Diastolic with preserved EF.  Continue current Rx Her updated medication list for this problem includes:    Metoprolol Succinate 50 Mg Xr24h-tab  (Metoprolol succinate) .Marland Kitchen... Take one-half  tablet by mouth daily    Furosemide 80 Mg Tabs (Furosemide) .Marland Kitchen... Take one tablet by mouth daily in the morning and take 1/2 tablet ( 40mg ) in the evening    Aspirin 81 Mg Tbec (Aspirin) .Marland Kitchen... Take one tablet by mouth daily  Orders: Echocardiogram (Echo)  Problem # 4:  HYPERTENSION, PULMONARY (ICD-416.8) Mild in setting of smoking and diastolic dysfunction.  No indication for specific vasodilator Rx.  F/U echo 2/12 to estimate PA pressure by TR velocity  Problem # 5:  CAROTID BRUIT (ICD-785.9) Mild diseae F/U duplex in February Orders: Carotid Duplex (Carotid Duplex)  Problem # 6:  AORTIC STENOSIS (ICD-424.1) No change in murmur Hemodynamics last assessed by cath 12/11.  Echo 2/12  Will not likely be an operative candidate in future due to comor bidities.   Her updated medication list for this problem includes:    Metoprolol Succinate 50 Mg Xr24h-tab (Metoprolol succinate) .Marland Kitchen... Take one-half  tablet by mouth daily  Furosemide 80 Mg Tabs (Furosemide) .Marland Kitchen... Take one tablet by mouth daily in the morning and take 1/2 tablet ( 40mg ) in the evening  Problem # 7:  EDEMA (ICD-782.3) Resolved with current dose of diuretic.  Componant of venous insuf.  Continue elevation of legs at end of day and low sodium diet  Patient Instructions: 1)  Your physician recommends that you schedule a follow-up appointment in: FEB 2012 2)  Your physician has requested that you have a carotid duplex. This test is an ultrasound of the carotid arteries in your neck. It looks at blood flow through these arteries that supply the brain with blood. Allow one hour for this exam. There are no restrictions or special instructions.FEB 2012 3)  Your physician has requested that you have an echocardiogram.  Echocardiography is a painless test that uses sound waves to create images of your heart. It provides your doctor with information about the size and shape of your heart and how  well your heart's chambers and valves are working.  This procedure takes approximately one hour. There are no restrictions for this procedure.FEB 2012   EKG Report  Procedure date:  12/25/2009  Findings:      NSR 80 LVH Nonspecfici ST/T wave changes Abnormal ECG

## 2010-03-28 NOTE — Medication Information (Signed)
Summary: [facility]   Imported By: [place] 09/03/2009 09:05:06  _____________________________________________________________________  External Attachment:    Type:   Image     Comment:   External Document

## 2010-03-28 NOTE — Assessment & Plan Note (Signed)
Summary: rov to review pft results.   Primary Provider/Referring Provider:  Dr Jeannetta Nap  CC:  Pt is here for a f/u appt to discuss PFT results.  .  History of Present Illness: the pt comes in today for multifactorial dyspnea.  She has known obesity, deconditioning, MSK issues, volume overload issues, and pulmonary venous htn.  She is a lifelong smoker, and has had recent pfts.  Surprisingly, these show a NORMAL FEV1 and FEV1%,  but she does have moderate airtrapping by lung volumes.  She has no restriction, and her DLCO is only mildly reduced but corrects to normal with Av.  These are most consistent with mild obstructive disease at best.  I have gone  over the study in detail with her and her husband, and answered all of her questions.  Medications Prior to Update: 1)  Metoprolol Succinate 50 Mg Xr24h-Tab (Metoprolol Succinate) .... Take One-Half  Tablet By Mouth Daily 2)  Furosemide 80 Mg Tabs (Furosemide) .... Take One Tablet By Mouth Daily in The Morning and Take 1/2 Tablet ( 40mg ) in The Evening 3)  Potassium Chloride Cr 10 Meq Cr-Caps (Potassium Chloride) .... Take 1 Tablet By Mouth Two Times A Day 4)  Levothroid 150 Mcg Tabs (Levothyroxine Sodium) .Marland Kitchen.. 1 Tab Once Daily 5)  Prozac 20 Mg Caps (Fluoxetine Hcl) .Marland Kitchen.. 1 Tab Once Daily 6)  Aspirin 81 Mg Tbec (Aspirin) .... Take One Tablet By Mouth Daily 7)  Drisdol 11914 Unit Caps (Ergocalciferol) .Marland Kitchen.. 1 Tab Q 2 Weeks 8)  Morphine Er .... Take 2 Tabs Q 12 Hours  Allergies (verified): 1)  ! Pcn 2)  ! * Latex  Review of Systems       The patient complains of shortness of breath with activity.  The patient denies shortness of breath at rest, productive cough, non-productive cough, coughing up blood, chest pain, irregular heartbeats, acid heartburn, indigestion, loss of appetite, weight change, abdominal pain, difficulty swallowing, sore throat, tooth/dental problems, headaches, nasal congestion/difficulty breathing through nose, sneezing,  itching, ear ache, anxiety, depression, hand/feet swelling, joint stiffness or pain, rash, change in color of mucus, and fever.    Vital Signs:  Patient profile:   65 year old female Height:      60 inches Weight:      203 pounds BMI:     39.79 O2 Sat:      91 % on Room air Temp:     98.2 degrees F oral Pulse rate:   78 / minute BP sitting:   134 / 68  (left arm) Cuff size:   large  Vitals Entered By: Arman Filter LPN (Jul 20, 2009 11:22 AM)  O2 Flow:  Room air CC: Pt is here for a f/u appt to discuss PFT results.   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  I have told her she is extremely lucky, and she should take this opportunity to quit smoking.  I have told her mild disease does not give her the "green light " to continue smoking.  I have discussed various approaches with her, and she will consider.  In the meantime, will start the pt on spiriva as a trial to see if it will help with her doe.  Problem # 2:  DYSPNEA ON EXERTION (ICD-786.09) The pt's doe is greatly out of proportion to her level of obstructive disease, and therefore her other issues as discussed in HPI will need to be addressed before things improve significantly.    Medications Added to Medication List This Visit: 1)  Spiriva Handihaler 18 Mcg Caps (Tiotropium bromide monohydrate) .... One puff in handihaler daily 2)  Proair Hfa 108 (90 Base) Mcg/act Aers (Albuterol sulfate) .... 2 puffs every 4-6 hours as needed  Other Orders: Est. Patient Level III ([address]) Prescription Created Electronically [phone]) Tobacco use cessation intermediate 3-10 minutes ([address]11)  Patient Instructions: 1)  stop smoking! 2)  will start on spiriva one each am 3)  can use  albuterol inhaler 2 puffs up to every 6hrs if needed for emergencies. 4)  work on weight loss and conditioning. 5)  followup with me in 3mos.  Prescriptions: PROAIR HFA 108 (90 BASE) MCG/ACT  AERS (ALBUTEROL SULFATE) 2 puffs every 4-6 hours as needed  #1 x 6   Entered and Authorized by:   Barbaraann Share MD   Signed by:   Barbaraann Share MD on 07/20/2009   Method used:   Electronically to        [facility]* (retail)       [address] [place], [place]  [address]       Ph: [phone] or [phone]       Fax: [phone]   RxID:   [id number] SPIRIVA HANDIHALER 18 MCG  CAPS (TIOTROPIUM BROMIDE MONOHYDRATE) one puff in handihaler daily  #30 x 6   Entered and Authorized by:   Barbaraann Share MD   Signed by:   Barbaraann Share MD on 07/20/2009   Method used:   Electronically to        [facility]* (retail)       [address] [place], [place]  [address]       Ph: [phone] or [phone]       Fax: [phone]   RxID:   [id number]    Immunization History:  Influenza Immunization History:    Influenza:  historical (02/25/2008)  Pneumovax Immunization History:    Pneumovax:  historical (02/25/2008)

## 2010-04-03 NOTE — Assessment & Plan Note (Signed)
Summary: F/U ECHO/CAROTID DONE TODAY AT 1PM/SL RS PER BUMPLIST/PT-MJ   Vital Signs:  Patient profile:   65 year old female Height:      60 inches Weight:      203 pounds BMI:     39.79 Pulse rate:   80 / minute Resp:     14 per minute BP sitting:   132 / 70  (left arm)  Vitals Entered By: Kem Parkinson (March 28, 2010 2:05 PM)  Referring Provider:  Charlton Haws  Primary Provider:  Dr Jeannetta Nap   History of Present Illness: [name] is seen today for F/U of AS, diastolic dysfunciton and dyspnea   She underwent right and left heart cath in 12/10 , and showed normal coronaries and moderate AS.  She had a PA pressure of 53/28,and a PCWP of 22 initially that rose to 31 by the end of the procedure.  Her CO by thermo was 6.5.   PVR was elevated at 3.6 and gradients across the AV were peak of 27 and mean of 21.  She has had some visual problems with her right eye.  This is not likely form her heart.  She had carotids in 5/11 which I reviewed and only had 0-39% bilateral disease.  She could not tolerate an MRI and CT showed no stroke.  She has a F/U with eye doctor and no other focal signs.  She has spinal stenosis and back surgery has been deferred to to her comorbidities.  She denies SSCP, sycope, PND orthopnea and has been compliant with meds.    Reviewed echo from today:  Mean gradient 41 Pk 75 AVA by VTI 1.26   Long discussion with her about smoking her comorbidities and the fact that she is barely a candidate for AVR now.  If she doesn't stop smoking she will end up as a palliative care patient or possibly need TAVR.  Also discussed smoking cessation with her husband as he smokes as well   Has continued LE edema right greater than left.  Some erythema as well.  Compliant with diuretic.  No fever skin breakdown or D/C.  Current Problems (verified): 1)  Tia  (ICD-435.9) 2)  Emphysema, Mild  (ICD-492.8) 3)  Dyspnea On Exertion  (ICD-786.09) 4)  Congestive Heart Failure Unspecified   (ICD-428.0) 5)  Preoperative Examination  (ICD-V72.84) 6)  Hypertension, Pulmonary  (ICD-416.8) 7)  Carotid Bruit  (ICD-785.9) 8)  Aortic Stenosis  (ICD-424.1) 9)  Venous Insufficiency  (ICD-459.81) 10)  Arthritis  (ICD-716.90) 11)  Depression  (ICD-311) 12)  COPD  (ICD-496) 13)  Hypothyroidism  (ICD-244.9) 14)  Thrombocytopenia  (ICD-287.5) 15)  Hypokalemia  (ICD-276.8) 16)  Edema  (ICD-782.3)  Current Medications (verified): 1)  Metoprolol Succinate 50 Mg Xr24h-Tab (Metoprolol Succinate) .... Take One-Half  Tablet By Mouth Daily 2)  Furosemide 80 Mg Tabs (Furosemide) .... Take One Tablet By Mouth Daily in The Morning and Take 1/2 Tablet ( 40mg ) in The Evening 3)  Potassium Chloride Cr 10 Meq Cr-Caps (Potassium Chloride) .... Two Tablets By Mouth Two Times A Day 4)  Levothroid 150 Mcg Tabs (Levothyroxine Sodium) .Marland Kitchen.. 1 Tab Once Daily 5)  Prozac 20 Mg Caps (Fluoxetine Hcl) .Marland Kitchen.. 1 Tab Once Daily 6)  Aspirin 81 Mg Tbec (Aspirin) .... Take One Tablet By Mouth Daily 7)  Drisdol 08657 Unit Caps (Ergocalciferol) .Marland Kitchen.. 1 Tab Q 2 Weeks 8)  Morphine .... As Directed 9)  Oxycontin 80 Mg Xr12h-Tab (Oxycodone Hcl) .... As Directed 10)  Zaroxolyn  2.5 Mg Tabs (Metolazone) .... One Tablet By Mouth Every Other Day 11)  Cipro 250 Mg Tabs (Ciprofloxacin Hcl) .... One Tablet By Mouth Two Times A Day X 10 Days  Allergies (verified): 1)  ! Pcn 2)  ! 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      Family History: Last updated: 07/19/2009 emphysema: father allergies: sister asthma: sister heart disease: father rheumatism: mother cancer: mother (blood) , maternal aunt ( lung)   Social History: Last updated: 07/19/2009 pt is married. pt has children. pt is a housewife.  Patient is a current smoker.  started at age 45.  2 ppd.   Review of Systems       Denies fever, malais, weight loss,  decreased visual acuity, cough, sputumhemoptysis, pleuritic pain, palpitaitons, heartburn, abdominal pain, melena,  or rash.   Physical Exam  General:  Affect appropriate Healthy:  appears stated age HEENT: normal Neck supple with no adenopathy JVP normal bilateral  bruits no thyromegaly Lungs clear with no wheezing and good diaphragmatic motion Heart:  S1/S2 preserved severe AS  murmur,rub, gallop or click PMI normal Abdomen: benighn, BS positve, no tenderness, no AAA no bruit.  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  Will call in Chantix if they want it  Problem # 2:  CAROTID BRUIT (ICD-785.9) F/U duplex today.  Bruits unchanged  Previous visual symptoms cleared and not TIA  Problem # 3:  AORTIC STENOSIS (ICD-424.1) Progression by echo.  S2 still preserved on exam.  No CAD by cath 2010  F/U echo 6 months.  Not a good surgical candidate Her updated medication list for this problem  includes:    Metoprolol Succinate 50 Mg Xr24h-tab (Metoprolol succinate) .Marland Kitchen... Take one-half  tablet by mouth daily    Furosemide 80 Mg Tabs (Furosemide) .Marland Kitchen... Take one tablet by mouth daily in the morning and take 1/2 tablet ( 40mg ) in the evening    Zaroxolyn 2.5 Mg Tabs (Metolazone) ..... One tablet by mouth every other day  Problem # 4:  VENOUS INSUFFICIENCY (ICD-459.81) Will increase KCL and add zaroxyln.  Clipro for 10 days for erythema.  JOBS knee high compression stockings.    Other Orders: Arterial Duplex Lower Extremity (Arterial Duplex Low)  Patient Instructions: 1)  Your physician recommends that you schedule a follow-up appointment in: 8 WEEKS 2)  Your physician recommends that you return for lab work in:8 WEEKS-BMP/782.3/V58.69 3)  Your physician has recommended you make the following change in your medication: START ZAROXOLYN 2.5MG  ONE TABLET EVERYOTHER DAY 4)  INCREASE POTASSIUM  TO TWO TABLETS TWICE DAILY 5)  TAKE CIPRO 250MG  ONE TABLET TWICE DAILY X 10 DAYS 6)  Your physician has requested that you have a lower or upper extremity arterial duplex.  This test is an ultrasound of the arteries in the legs or arms.  It looks at arterial blood flow in the legs and arms.  Allow one hour for Lower and Upper Arterial scans. There are no restrictions or special instructions.SCHEDULE IN 8 WEEKS Prescriptions: CIPRO 250 MG TABS (CIPROFLOXACIN HCL) one tablet by mouth two times a day x 10 days  #20 x 0   Entered by:   Deliah Goody, RN   Authorized by:   Colon Branch, MD, [facility]   Signed by:   Deliah Goody, RN on 03/28/2010   Method used:   Electronically to        [facility]* (retail)       [address] [place], [place]  [address]       Ph: [phone] or [phone]       Fax: [phone]   RxID:   [id number] POTASSIUM CHLORIDE CR 10 MEQ CR-CAPS (POTASSIUM CHLORIDE) two tablets by mouth two times a day   #120 x 12   Entered by:   Deliah Goody, RN   Authorized by:   Colon Branch, MD, [facility]   Signed by:   Deliah Goody, RN on 03/28/2010   Method used:   Electronically to        [facility]* (retail)       [address] [place], [place]  [address]       Ph: [phone] or [phone]       Fax: [phone]   RxID:   [id number] ZAROXOLYN 2.5 MG TABS (METOLAZONE) one tablet by mouth every other day  #30 x 12   Entered by:   Deliah Goody, RN   Authorized by:   Colon Branch, MD, [facility]   Signed by:   Deliah Goody, RN on 03/28/2010   Method used:   Electronically to        [facility]* (retail)       [address] [place], [place]  [address]       Ph: [phone] or [phone]       Fax: [phone]   RxID:   [id number]    Orders Added: 1)  Arterial Duplex Lower Extremity [Arterial Duplex Low]

## 2010-05-08 LAB — CBC
HCT: 43.6 % (ref 36.0–46.0)
Hemoglobin: 14.4 g/dL (ref 12.0–15.0)
MCH: 29.9 pg (ref 26.0–34.0)
MCHC: 33 g/dL (ref 30.0–36.0)
MCV: 90.6 fL (ref 78.0–100.0)
RBC: 4.81 MIL/uL (ref 3.87–5.11)

## 2010-05-08 LAB — URINALYSIS, ROUTINE W REFLEX MICROSCOPIC
Bilirubin Urine: NEGATIVE
Glucose, UA: NEGATIVE mg/dL
Hgb urine dipstick: NEGATIVE
Protein, ur: NEGATIVE mg/dL
Specific Gravity, Urine: 1.012 (ref 1.005–1.030)
Urobilinogen, UA: 1 mg/dL (ref 0.0–1.0)

## 2010-05-08 LAB — DIFFERENTIAL
Basophils Relative: 0 % (ref 0–1)
Eosinophils Absolute: 0 10*3/uL (ref 0.0–0.7)
Eosinophils Relative: 1 % (ref 0–5)
Lymphs Abs: 0.9 10*3/uL (ref 0.7–4.0)
Monocytes Absolute: 0.3 10*3/uL (ref 0.1–1.0)
Monocytes Relative: 5 % (ref 3–12)

## 2010-05-08 LAB — BASIC METABOLIC PANEL
CO2: 29 mEq/L (ref 19–32)
Calcium: 9.3 mg/dL (ref 8.4–10.5)
Chloride: 104 mEq/L (ref 96–112)
Glucose, Bld: 109 mg/dL — ABNORMAL HIGH (ref 70–99)
Sodium: 139 mEq/L (ref 135–145)

## 2010-05-10 ENCOUNTER — Encounter: Payer: Self-pay | Admitting: Cardiovascular Disease

## 2010-05-23 ENCOUNTER — Ambulatory Visit: Payer: Self-pay | Admitting: Cardiovascular Disease

## 2010-05-23 ENCOUNTER — Encounter: Payer: Self-pay | Admitting: *Deleted

## 2010-05-23 ENCOUNTER — Other Ambulatory Visit: Payer: Self-pay | Admitting: Cardiovascular Disease

## 2010-05-23 ENCOUNTER — Other Ambulatory Visit: Payer: Self-pay | Admitting: *Deleted

## 2010-05-23 DIAGNOSIS — R609 Edema, unspecified: Secondary | ICD-10-CM

## 2010-05-24 ENCOUNTER — Encounter: Payer: Self-pay | Admitting: Cardiovascular Disease

## 2010-05-27 LAB — POCT I-STAT 3, VENOUS BLOOD GAS (G3P V)
Acid-Base Excess: 5 mmol/L — ABNORMAL HIGH (ref 0.0–2.0)
Bicarbonate: 31.7 mEq/L — ABNORMAL HIGH (ref 20.0–24.0)
O2 Saturation: 58 %
TCO2: 33 mmol/L (ref 0–100)
pCO2, Ven: 54.6 mmHg — ABNORMAL HIGH (ref 45.0–50.0)
pH, Ven: 7.372 — ABNORMAL HIGH (ref 7.250–7.300)
pO2, Ven: 32 mmHg (ref 30.0–45.0)

## 2010-05-27 LAB — POCT I-STAT 3, ART BLOOD GAS (G3+)
Acid-Base Excess: 10 mmol/L — ABNORMAL HIGH (ref 0.0–2.0)
Acid-Base Excess: 10 mmol/L — ABNORMAL HIGH (ref 0.0–2.0)
Bicarbonate: 35.9 mEq/L — ABNORMAL HIGH (ref 20.0–24.0)
Bicarbonate: 36.2 mEq/L — ABNORMAL HIGH (ref 20.0–24.0)
O2 Saturation: 81 %
O2 Saturation: 86 %
TCO2: 38 mmol/L (ref 0–100)
pCO2 arterial: 53.9 mmHg — ABNORMAL HIGH (ref 35.0–45.0)
pCO2 arterial: 55 mmHg — ABNORMAL HIGH (ref 35.0–45.0)
pH, Arterial: 7.431 — ABNORMAL HIGH (ref 7.350–7.400)
pO2, Arterial: 45 mmHg — ABNORMAL LOW (ref 80.0–100.0)
pO2, Arterial: 52 mmHg — ABNORMAL LOW (ref 80.0–100.0)

## 2010-05-28 LAB — BASIC METABOLIC PANEL
BUN: 17 mg/dL (ref 6–23)
BUN: 25 mg/dL — ABNORMAL HIGH (ref 6–23)
CO2: 34 mEq/L — ABNORMAL HIGH (ref 19–32)
Chloride: 101 mEq/L (ref 96–112)
Chloride: 102 mEq/L (ref 96–112)
Creatinine, Ser: 0.85 mg/dL (ref 0.4–1.2)
GFR calc Af Amer: >60 mL/min (ref >60)
GFR calc non Af Amer: >60 mL/min (ref >60)
Glucose, Bld: 100 mg/dL — ABNORMAL HIGH (ref 70–99)
Potassium: 3.6 mEq/L (ref 3.5–5.1)
Potassium: 3.7 mEq/L (ref 3.5–5.1)

## 2010-05-28 LAB — GLUCOSE, CAPILLARY
Glucose-Capillary: 107 mg/dL — ABNORMAL HIGH (ref 70–99)
Glucose-Capillary: 86 mg/dL (ref 70–99)
Glucose-Capillary: 95 mg/dL (ref 70–99)
Glucose-Capillary: 97 mg/dL (ref 70–99)
Glucose-Capillary: 99 mg/dL (ref 70–99)

## 2010-05-28 LAB — CBC
HCT: 35.5 % — ABNORMAL LOW (ref 36.0–46.0)
MCHC: 33.9 g/dL (ref 30.0–36.0)
MCV: 93.2 fL (ref 78.0–100.0)
Platelets: 111 10*3/uL — ABNORMAL LOW (ref 150–400)
Platelets: 141 10*3/uL — ABNORMAL LOW (ref 150–400)
RBC: 3.89 MIL/uL (ref 3.87–5.11)
WBC: 6.1 10*3/uL (ref 4.0–10.5)
WBC: 7.6 10*3/uL (ref 4.0–10.5)

## 2010-05-29 LAB — GLUCOSE, CAPILLARY
Glucose-Capillary: 102 mg/dL — ABNORMAL HIGH (ref 70–99)
Glucose-Capillary: 108 mg/dL — ABNORMAL HIGH (ref 70–99)
Glucose-Capillary: 112 mg/dL — ABNORMAL HIGH (ref 70–99)
Glucose-Capillary: 115 mg/dL — ABNORMAL HIGH (ref 70–99)
Glucose-Capillary: 115 mg/dL — ABNORMAL HIGH (ref 70–99)
Glucose-Capillary: 121 mg/dL — ABNORMAL HIGH (ref 70–99)
Glucose-Capillary: 122 mg/dL — ABNORMAL HIGH (ref 70–99)
Glucose-Capillary: 125 mg/dL — ABNORMAL HIGH (ref 70–99)
Glucose-Capillary: 126 mg/dL — ABNORMAL HIGH (ref 70–99)
Glucose-Capillary: 128 mg/dL — ABNORMAL HIGH (ref 70–99)
Glucose-Capillary: 129 mg/dL — ABNORMAL HIGH (ref 70–99)
Glucose-Capillary: 130 mg/dL — ABNORMAL HIGH (ref 70–99)
Glucose-Capillary: 140 mg/dL — ABNORMAL HIGH (ref 70–99)
Glucose-Capillary: 151 mg/dL — ABNORMAL HIGH (ref 70–99)
Glucose-Capillary: 155 mg/dL — ABNORMAL HIGH (ref 70–99)
Glucose-Capillary: 157 mg/dL — ABNORMAL HIGH (ref 70–99)
Glucose-Capillary: 174 mg/dL — ABNORMAL HIGH (ref 70–99)
Glucose-Capillary: 176 mg/dL — ABNORMAL HIGH (ref 70–99)
Glucose-Capillary: 207 mg/dL — ABNORMAL HIGH (ref 70–99)
Glucose-Capillary: 94 mg/dL (ref 70–99)
Glucose-Capillary: 98 mg/dL (ref 70–99)

## 2010-05-29 LAB — CBC
HCT: 31.9 % — ABNORMAL LOW (ref 36.0–46.0)
HCT: 32.8 % — ABNORMAL LOW (ref 36.0–46.0)
HCT: 34.6 % — ABNORMAL LOW (ref 36.0–46.0)
HCT: 34.8 % — ABNORMAL LOW (ref 36.0–46.0)
HCT: 35.2 % — ABNORMAL LOW (ref 36.0–46.0)
HCT: 35.9 % — ABNORMAL LOW (ref 36.0–46.0)
Hemoglobin: 10.9 g/dL — ABNORMAL LOW (ref 12.0–15.0)
Hemoglobin: 11.2 g/dL — ABNORMAL LOW (ref 12.0–15.0)
Hemoglobin: 11.8 g/dL — ABNORMAL LOW (ref 12.0–15.0)
Hemoglobin: 11.9 g/dL — ABNORMAL LOW (ref 12.0–15.0)
Hemoglobin: 12.2 g/dL (ref 12.0–15.0)
Hemoglobin: 12.5 g/dL (ref 12.0–15.0)
MCHC: 34.1 g/dL (ref 30.0–36.0)
MCHC: 34.3 g/dL (ref 30.0–36.0)
MCHC: 34.6 g/dL (ref 30.0–36.0)
MCHC: 34.7 g/dL (ref 30.0–36.0)
MCV: 92.4 fL (ref 78.0–100.0)
MCV: 93.2 fL (ref 78.0–100.0)
MCV: 93.4 fL (ref 78.0–100.0)
MCV: 93.5 fL (ref 78.0–100.0)
MCV: 93.9 fL (ref 78.0–100.0)
MCV: 94.1 fL (ref 78.0–100.0)
MCV: 95.6 fL (ref 78.0–100.0)
Platelets: 100 10*3/uL — ABNORMAL LOW (ref 150–400)
Platelets: 109 10*3/uL — ABNORMAL LOW (ref 150–400)
Platelets: 134 10*3/uL — ABNORMAL LOW (ref 150–400)
Platelets: 59 10*3/uL — ABNORMAL LOW (ref 150–400)
Platelets: 64 10*3/uL — ABNORMAL LOW (ref 150–400)
Platelets: 76 10*3/uL — ABNORMAL LOW (ref 150–400)
RBC: 3.4 MIL/uL — ABNORMAL LOW (ref 3.87–5.11)
RBC: 3.7 MIL/uL — ABNORMAL LOW (ref 3.87–5.11)
RBC: 3.71 MIL/uL — ABNORMAL LOW (ref 3.87–5.11)
RBC: 3.87 MIL/uL (ref 3.87–5.11)
RDW: 14.2 % (ref 11.5–15.5)
RDW: 14.4 % (ref 11.5–15.5)
RDW: 14.5 % (ref 11.5–15.5)
RDW: 14.7 % (ref 11.5–15.5)
RDW: 14.8 % (ref 11.5–15.5)
RDW: 15.1 % (ref 11.5–15.5)
WBC: 22 10*3/uL — ABNORMAL HIGH (ref 4.0–10.5)
WBC: 3.7 10*3/uL — ABNORMAL LOW (ref 4.0–10.5)
WBC: 4.2 10*3/uL (ref 4.0–10.5)
WBC: 5.8 10*3/uL (ref 4.0–10.5)
WBC: 6.2 10*3/uL (ref 4.0–10.5)
WBC: 9.4 10*3/uL (ref 4.0–10.5)

## 2010-05-29 LAB — BLOOD GAS, ARTERIAL
Acid-Base Excess: 11 mmol/L — ABNORMAL HIGH (ref 0.0–2.0)
Acid-Base Excess: 3.3 mmol/L — ABNORMAL HIGH (ref 0.0–2.0)
Acid-Base Excess: 3.5 mmol/L — ABNORMAL HIGH (ref 0.0–2.0)
Acid-Base Excess: 3.6 mmol/L — ABNORMAL HIGH (ref 0.0–2.0)
Acid-Base Excess: 3.8 mmol/L — ABNORMAL HIGH (ref 0.0–2.0)
Acid-Base Excess: 3.8 mmol/L — ABNORMAL HIGH (ref 0.0–2.0)
Acid-Base Excess: 4.7 mmol/L — ABNORMAL HIGH (ref 0.0–2.0)
Acid-Base Excess: 5.7 mmol/L — ABNORMAL HIGH (ref 0.0–2.0)
Acid-Base Excess: 5.9 mmol/L — ABNORMAL HIGH (ref 0.0–2.0)
Acid-Base Excess: 7.1 mmol/L — ABNORMAL HIGH (ref 0.0–2.0)
Acid-Base Excess: 7.9 mmol/L — ABNORMAL HIGH (ref 0.0–2.0)
Bicarbonate: 27.7 mEq/L — ABNORMAL HIGH (ref 20.0–24.0)
Bicarbonate: 29.1 mEq/L — ABNORMAL HIGH (ref 20.0–24.0)
Bicarbonate: 29.3 mEq/L — ABNORMAL HIGH (ref 20.0–24.0)
Bicarbonate: 29.3 mEq/L — ABNORMAL HIGH (ref 20.0–24.0)
Bicarbonate: 29.6 mEq/L — ABNORMAL HIGH (ref 20.0–24.0)
Bicarbonate: 30.7 mEq/L — ABNORMAL HIGH (ref 20.0–24.0)
Bicarbonate: 32.5 mEq/L — ABNORMAL HIGH (ref 20.0–24.0)
Bicarbonate: 35.8 mEq/L — ABNORMAL HIGH (ref 20.0–24.0)
Drawn by: 275531
Drawn by: 28701
Drawn by: 312881
FIO2: 0.4 %
FIO2: 0.4 %
FIO2: 0.4 %
FIO2: 0.4 %
FIO2: 0.4 %
FIO2: 0.4 %
FIO2: 0.5 %
FIO2: 0.6 %
FIO2: 0.6 %
FIO2: 0.7 %
FIO2: 1 %
FIO2: 40 %
MECHVT: 300 mL
MECHVT: 300 mL
MECHVT: 300 mL
MECHVT: 300 mL
MECHVT: 300 mL
MECHVT: 300 mL
MECHVT: 350 mL
MECHVT: 400 mL
MECHVT: 500 mL
O2 Saturation: 91.6 %
O2 Saturation: 91.8 %
O2 Saturation: 92.5 %
O2 Saturation: 93.4 %
O2 Saturation: 93.5 %
O2 Saturation: 95.2 %
O2 Saturation: 96.3 %
O2 Saturation: 97 %
O2 Saturation: 98.2 %
O2 Saturation: 99.3 %
PEEP: 0.5 cmH2O
PEEP: 10 cmH2O
PEEP: 10 cmH2O
PEEP: 12 cmH2O
PEEP: 5 cmH2O
PEEP: 5 cmH2O
Patient temperature: 100.1
Patient temperature: 98.3
Patient temperature: 98.6
Patient temperature: 98.6
Patient temperature: 98.6
Patient temperature: 99.4
Patient temperature: 99.5
Pressure support: 5 cmH2O
RATE: 20 resp/min
RATE: 20 resp/min
RATE: 20 resp/min
RATE: 28 resp/min
RATE: 32 resp/min
RATE: 32 resp/min
RATE: 32 resp/min
TCO2: 29.9 mmol/L (ref 0–100)
TCO2: 30.1 mmol/L (ref 0–100)
TCO2: 30.8 mmol/L (ref 0–100)
TCO2: 30.9 mmol/L (ref 0–100)
TCO2: 31 mmol/L (ref 0–100)
TCO2: 32.1 mmol/L (ref 0–100)
TCO2: 32.2 mmol/L (ref 0–100)
TCO2: 32.4 mmol/L (ref 0–100)
TCO2: 34.3 mmol/L (ref 0–100)
TCO2: 37.2 mmol/L (ref 0–100)
pCO2 arterial: 48.7 mmHg — ABNORMAL HIGH (ref 35.0–45.0)
pCO2 arterial: 50.4 mmHg — ABNORMAL HIGH (ref 35.0–45.0)
pCO2 arterial: 50.6 mmHg — ABNORMAL HIGH (ref 35.0–45.0)
pCO2 arterial: 52 mmHg — ABNORMAL HIGH (ref 35.0–45.0)
pCO2 arterial: 52.1 mmHg — ABNORMAL HIGH (ref 35.0–45.0)
pCO2 arterial: 54.1 mmHg — ABNORMAL HIGH (ref 35.0–45.0)
pCO2 arterial: 54.7 mmHg — ABNORMAL HIGH (ref 35.0–45.0)
pCO2 arterial: 57.4 mmHg (ref 35.0–45.0)
pCO2 arterial: 57.5 mmHg (ref 35.0–45.0)
pCO2 arterial: 59.1 mmHg (ref 35.0–45.0)
pH, Arterial: 7.328 — ABNORMAL LOW (ref 7.350–7.400)
pH, Arterial: 7.341 — ABNORMAL LOW (ref 7.350–7.400)
pH, Arterial: 7.36 (ref 7.350–7.400)
pH, Arterial: 7.369 (ref 7.350–7.400)
pH, Arterial: 7.396 (ref 7.350–7.400)
pH, Arterial: 7.433 — ABNORMAL HIGH (ref 7.350–7.400)
pO2, Arterial: 137 mmHg — ABNORMAL HIGH (ref 80.0–100.0)
pO2, Arterial: 56.9 mmHg — ABNORMAL LOW (ref 80.0–100.0)
pO2, Arterial: 61.8 mmHg — ABNORMAL LOW (ref 80.0–100.0)
pO2, Arterial: 63.9 mmHg — ABNORMAL LOW (ref 80.0–100.0)
pO2, Arterial: 73.4 mmHg — ABNORMAL LOW (ref 80.0–100.0)
pO2, Arterial: 92.7 mmHg (ref 80.0–100.0)

## 2010-05-29 LAB — URINALYSIS, ROUTINE W REFLEX MICROSCOPIC
Bilirubin Urine: NEGATIVE
Glucose, UA: NEGATIVE mg/dL
Ketones, ur: NEGATIVE mg/dL
Nitrite: NEGATIVE
Specific Gravity, Urine: 1.017 (ref 1.005–1.030)
pH: 6.5 (ref 5.0–8.0)

## 2010-05-29 LAB — DIFFERENTIAL
Basophils Relative: 1 % (ref 0–1)
Eosinophils Absolute: 0.1 10*3/uL (ref 0.0–0.7)
Eosinophils Relative: 1 % (ref 0–5)
Eosinophils Relative: 1 % (ref 0–5)
Lymphocytes Relative: 43 % (ref 12–46)
Lymphs Abs: 9.5 10*3/uL — ABNORMAL HIGH (ref 0.7–4.0)
Monocytes Absolute: 0.9 10*3/uL (ref 0.1–1.0)
Monocytes Relative: 10 % (ref 3–12)
Monocytes Relative: 4 % (ref 3–12)
Neutro Abs: 11.3 10*3/uL — ABNORMAL HIGH (ref 1.7–7.7)
Neutrophils Relative %: 61 % (ref 43–77)

## 2010-05-29 LAB — CULTURE, BLOOD (ROUTINE X 2)
Culture: NO GROWTH
Culture: NO GROWTH

## 2010-05-29 LAB — POCT I-STAT 3, ART BLOOD GAS (G3+)
Acid-base deficit: 3 mmol/L — ABNORMAL HIGH (ref 0.0–2.0)
O2 Saturation: 91 %
pO2, Arterial: 82 mmHg (ref 80.0–100.0)

## 2010-05-29 LAB — BASIC METABOLIC PANEL
BUN: 17 mg/dL (ref 6–23)
BUN: 23 mg/dL (ref 6–23)
BUN: 27 mg/dL — ABNORMAL HIGH (ref 6–23)
BUN: 29 mg/dL — ABNORMAL HIGH (ref 6–23)
BUN: 30 mg/dL — ABNORMAL HIGH (ref 6–23)
CO2: 27 mEq/L (ref 19–32)
CO2: 28 mEq/L (ref 19–32)
CO2: 29 mEq/L (ref 19–32)
CO2: 34 mEq/L — ABNORMAL HIGH (ref 19–32)
CO2: 34 mEq/L — ABNORMAL HIGH (ref 19–32)
CO2: 35 mEq/L — ABNORMAL HIGH (ref 19–32)
CO2: 36 mEq/L — ABNORMAL HIGH (ref 19–32)
Calcium: 7.9 mg/dL — ABNORMAL LOW (ref 8.4–10.5)
Calcium: 8.2 mg/dL — ABNORMAL LOW (ref 8.4–10.5)
Calcium: 8.2 mg/dL — ABNORMAL LOW (ref 8.4–10.5)
Chloride: 103 mEq/L (ref 96–112)
Chloride: 104 mEq/L (ref 96–112)
Chloride: 105 mEq/L (ref 96–112)
Chloride: 105 mEq/L (ref 96–112)
Chloride: 106 mEq/L (ref 96–112)
Chloride: 107 mEq/L (ref 96–112)
Creatinine, Ser: 0.64 mg/dL (ref 0.4–1.2)
Creatinine, Ser: 0.64 mg/dL (ref 0.4–1.2)
Creatinine, Ser: 0.68 mg/dL (ref 0.4–1.2)
Creatinine, Ser: 0.7 mg/dL (ref 0.4–1.2)
Creatinine, Ser: 0.71 mg/dL (ref 0.4–1.2)
Creatinine, Ser: 0.75 mg/dL (ref 0.4–1.2)
GFR calc Af Amer: >60 mL/min (ref >60)
GFR calc Af Amer: >60 mL/min (ref >60)
GFR calc Af Amer: >60 mL/min (ref >60)
GFR calc Af Amer: >60 mL/min (ref >60)
GFR calc Af Amer: >60 mL/min (ref >60)
GFR calc Af Amer: >60 mL/min (ref >60)
GFR calc non Af Amer: >60 mL/min (ref >60)
GFR calc non Af Amer: >60 mL/min (ref >60)
GFR calc non Af Amer: >60 mL/min (ref >60)
Glucose, Bld: 111 mg/dL — ABNORMAL HIGH (ref 70–99)
Glucose, Bld: 115 mg/dL — ABNORMAL HIGH (ref 70–99)
Glucose, Bld: 127 mg/dL — ABNORMAL HIGH (ref 70–99)
Glucose, Bld: 137 mg/dL — ABNORMAL HIGH (ref 70–99)
Glucose, Bld: 189 mg/dL — ABNORMAL HIGH (ref 70–99)
Glucose, Bld: 219 mg/dL — ABNORMAL HIGH (ref 70–99)
Potassium: 3.4 mEq/L — ABNORMAL LOW (ref 3.5–5.1)
Potassium: 3.5 mEq/L (ref 3.5–5.1)
Potassium: 3.8 mEq/L (ref 3.5–5.1)
Potassium: 4.1 mEq/L (ref 3.5–5.1)
Sodium: 139 mEq/L (ref 135–145)
Sodium: 141 mEq/L (ref 135–145)
Sodium: 142 mEq/L (ref 135–145)
Sodium: 142 mEq/L (ref 135–145)
Sodium: 143 mEq/L (ref 135–145)
Sodium: 143 mEq/L (ref 135–145)

## 2010-05-29 LAB — COMPREHENSIVE METABOLIC PANEL
AST: 52 U/L — ABNORMAL HIGH (ref 0–37)
Albumin: 3.7 g/dL (ref 3.5–5.2)
Alkaline Phosphatase: 59 U/L (ref 39–117)
BUN: 27 mg/dL — ABNORMAL HIGH (ref 6–23)
Calcium: 8.4 mg/dL (ref 8.4–10.5)
Chloride: 100 mEq/L (ref 96–112)
Chloride: 112 mEq/L (ref 96–112)
Creatinine, Ser: 0.58 mg/dL (ref 0.4–1.2)
Creatinine, Ser: 1.04 mg/dL (ref 0.4–1.2)
GFR calc Af Amer: >60 mL/min (ref >60)
Glucose, Bld: 163 mg/dL — ABNORMAL HIGH (ref 70–99)
Potassium: 4.5 mEq/L (ref 3.5–5.1)
Total Bilirubin: 0.4 mg/dL (ref 0.3–1.2)
Total Bilirubin: 0.9 mg/dL (ref 0.3–1.2)
Total Protein: 8.1 g/dL (ref 6.0–8.3)

## 2010-05-29 LAB — POCT I-STAT, CHEM 8
Calcium, Ion: 0.87 mmol/L — ABNORMAL LOW (ref 1.12–1.32)
Creatinine, Ser: 0.6 mg/dL (ref 0.4–1.2)
Hemoglobin: 18.4 g/dL — ABNORMAL HIGH (ref 12.0–15.0)
Sodium: 136 mEq/L (ref 135–145)
TCO2: 19 mmol/L (ref 0–100)

## 2010-05-29 LAB — CARDIAC PANEL(CRET KIN+CKTOT+MB+TROPI)
CK, MB: 4.5 ng/mL — ABNORMAL HIGH (ref 0.3–4.0)
Relative Index: 1.2 (ref 0.0–2.5)
Total CK: 153 U/L (ref 7–177)
Total CK: 176 U/L (ref 7–177)
Total CK: 186 U/L — ABNORMAL HIGH (ref 7–177)
Troponin I: 0.23 ng/mL — ABNORMAL HIGH (ref 0.00–0.06)
Troponin I: 0.54 ng/mL (ref 0.00–0.06)

## 2010-05-29 LAB — DRUGS OF ABUSE SCREEN W/O ALC, ROUTINE URINE
Amphetamine Screen, Ur: NEGATIVE
Barbiturate Quant, Ur: NEGATIVE
Marijuana Metabolite: NEGATIVE
Phencyclidine (PCP): NEGATIVE

## 2010-05-29 LAB — HEPATIC FUNCTION PANEL
ALT: 30 U/L (ref 0–35)
AST: 46 U/L — ABNORMAL HIGH (ref 0–37)
Bilirubin, Direct: 0.2 mg/dL (ref 0.0–0.3)
Total Protein: 6 g/dL (ref 6.0–8.3)

## 2010-05-29 LAB — URINE CULTURE

## 2010-05-29 LAB — URINE MICROSCOPIC-ADD ON

## 2010-05-29 LAB — BRAIN NATRIURETIC PEPTIDE: Pro B Natriuretic peptide (BNP): 560 pg/mL — ABNORMAL HIGH (ref 0.0–100.0)

## 2010-05-29 LAB — PHOSPHORUS
Phosphorus: 1.9 mg/dL — ABNORMAL LOW (ref 2.3–4.6)
Phosphorus: 3.2 mg/dL (ref 2.3–4.6)
Phosphorus: 3.7 mg/dL (ref 2.3–4.6)

## 2010-05-29 LAB — STREP PNEUMONIAE URINARY ANTIGEN: Strep Pneumo Urinary Antigen: NEGATIVE

## 2010-05-29 LAB — BENZODIAZEPINE, QUANTITATIVE, URINE: Oxazepam GC/MS Conf: NEGATIVE

## 2010-05-29 LAB — TYPE AND SCREEN
ABO/RH(D): O POS
Antibody Screen: NEGATIVE

## 2010-05-29 LAB — LACTIC ACID, PLASMA: Lactic Acid, Venous: 7.3 mmol/L — ABNORMAL HIGH (ref 0.5–2.2)

## 2010-05-29 LAB — CULTURE, BAL-QUANTITATIVE W GRAM STAIN: Colony Count: 35000

## 2010-05-29 LAB — APTT: aPTT: 36 seconds (ref 24–37)

## 2010-05-29 LAB — INFLUENZA A H1N1
Influenza A RNA: NOT DETECTED
Swine Influenza H1 Gene: NOT DETECTED

## 2010-05-29 LAB — AMMONIA: Ammonia: 211 umol/L — ABNORMAL HIGH (ref 11–35)

## 2010-05-29 LAB — ABO/RH: ABO/RH(D): O POS

## 2010-05-29 LAB — POCT CARDIAC MARKERS

## 2010-05-29 LAB — LEGIONELLA ANTIGEN, URINE: Legionella Antigen, Urine: NEGATIVE

## 2010-06-04 ENCOUNTER — Inpatient Hospital Stay (HOSPITAL_COMMUNITY)
Admission: EM | Admit: 2010-06-04 | Discharge: 2010-06-05 | DRG: 291 | Disposition: A | Discharge status: Home / Self Care | Payer: Medicaid Other | Attending: Internal Medicine | Admitting: Internal Medicine

## 2010-06-04 ENCOUNTER — Emergency Department (HOSPITAL_COMMUNITY): Payer: Medicaid Other

## 2010-06-04 DIAGNOSIS — Z8673 Personal history of transient ischemic attack (TIA), and cerebral infarction without residual deficits: Secondary | ICD-10-CM

## 2010-06-04 DIAGNOSIS — I509 Heart failure, unspecified: Secondary | ICD-10-CM | POA: Diagnosis present

## 2010-06-04 DIAGNOSIS — I080 Rheumatic disorders of both mitral and aortic valves: Secondary | ICD-10-CM | POA: Diagnosis present

## 2010-06-04 DIAGNOSIS — J189 Pneumonia, unspecified organism: Secondary | ICD-10-CM | POA: Diagnosis present

## 2010-06-04 DIAGNOSIS — I2789 Other specified pulmonary heart diseases: Secondary | ICD-10-CM | POA: Diagnosis present

## 2010-06-04 DIAGNOSIS — G8929 Other chronic pain: Secondary | ICD-10-CM | POA: Diagnosis present

## 2010-06-04 DIAGNOSIS — J449 Chronic obstructive pulmonary disease, unspecified: Secondary | ICD-10-CM | POA: Diagnosis present

## 2010-06-04 DIAGNOSIS — Z7982 Long term (current) use of aspirin: Secondary | ICD-10-CM

## 2010-06-04 DIAGNOSIS — I5033 Acute on chronic diastolic (congestive) heart failure: Principal | ICD-10-CM | POA: Diagnosis present

## 2010-06-04 DIAGNOSIS — I872 Venous insufficiency (chronic) (peripheral): Secondary | ICD-10-CM | POA: Diagnosis present

## 2010-06-04 DIAGNOSIS — E039 Hypothyroidism, unspecified: Secondary | ICD-10-CM | POA: Diagnosis present

## 2010-06-04 DIAGNOSIS — F17200 Nicotine dependence, unspecified, uncomplicated: Secondary | ICD-10-CM | POA: Diagnosis present

## 2010-06-04 DIAGNOSIS — E559 Vitamin D deficiency, unspecified: Secondary | ICD-10-CM | POA: Diagnosis present

## 2010-06-04 DIAGNOSIS — E876 Hypokalemia: Secondary | ICD-10-CM | POA: Diagnosis present

## 2010-06-04 DIAGNOSIS — J4489 Other specified chronic obstructive pulmonary disease: Secondary | ICD-10-CM | POA: Diagnosis present

## 2010-06-04 DIAGNOSIS — Z79899 Other long term (current) drug therapy: Secondary | ICD-10-CM

## 2010-06-04 DIAGNOSIS — D696 Thrombocytopenia, unspecified: Secondary | ICD-10-CM | POA: Diagnosis present

## 2010-06-04 LAB — DIFFERENTIAL
Basophils Absolute: 0 10*3/uL (ref 0.0–0.1)
Basophils Absolute: 0 10*3/uL (ref 0.0–0.1)
Basophils Relative: 0 % (ref 0–1)
Basophils Relative: 0 % (ref 0–1)
Eosinophils Absolute: 0 10*3/uL (ref 0.0–0.7)
Eosinophils Relative: 1 % (ref 0–5)
Lymphocytes Relative: 21 % (ref 12–46)
Monocytes Relative: 4 % (ref 3–12)
Neutro Abs: 3.8 10*3/uL (ref 1.7–7.7)
Neutrophils Relative %: 73 % (ref 43–77)

## 2010-06-04 LAB — POCT CARDIAC MARKERS
CKMB, poc: 2.6 ng/mL (ref 1.0–8.0)
CKMB, poc: 2.7 ng/mL (ref 1.0–8.0)
Myoglobin, poc: 103 ng/mL (ref 12–200)
Myoglobin, poc: 145 ng/mL (ref 12–200)
Troponin i, poc: 0.05 ng/mL (ref 0.00–0.09)

## 2010-06-04 LAB — POCT I-STAT 3, VENOUS BLOOD GAS (G3P V)
Bicarbonate: 28.2 mEq/L — ABNORMAL HIGH (ref 20.0–24.0)
O2 Saturation: 96 %
pCO2, Ven: 47.3 mmHg (ref 45.0–50.0)
pO2, Ven: 82 mmHg — ABNORMAL HIGH (ref 30.0–45.0)

## 2010-06-04 LAB — CBC
Hemoglobin: 13.7 g/dL (ref 12.0–15.0)
MCV: 88.4 fL (ref 78.0–100.0)
Platelets: 67 10*3/uL — ABNORMAL LOW (ref 150–400)
RBC: 4.8 MIL/uL (ref 3.87–5.11)
RDW: 15.7 % — ABNORMAL HIGH (ref 11.5–15.5)
WBC: 3.5 10*3/uL — ABNORMAL LOW (ref 4.0–10.5)

## 2010-06-04 LAB — COMPREHENSIVE METABOLIC PANEL
Albumin: 2.8 g/dL — ABNORMAL LOW (ref 3.5–5.2)
Alkaline Phosphatase: 83 U/L (ref 39–117)
BUN: 15 mg/dL (ref 6–23)
Potassium: 3.5 mEq/L (ref 3.5–5.1)
Sodium: 142 mEq/L (ref 135–145)
Total Protein: 5.7 g/dL — ABNORMAL LOW (ref 6.0–8.3)

## 2010-06-04 LAB — POCT I-STAT, CHEM 8
Chloride: 106 mEq/L (ref 96–112)
Creatinine, Ser: 1 mg/dL (ref 0.4–1.2)
Hemoglobin: 15 g/dL (ref 12.0–15.0)
Potassium: 3.4 mEq/L — ABNORMAL LOW (ref 3.5–5.1)
Sodium: 145 mEq/L (ref 135–145)

## 2010-06-04 LAB — BRAIN NATRIURETIC PEPTIDE: Pro B Natriuretic peptide (BNP): 281 pg/mL — ABNORMAL HIGH (ref 0.0–100.0)

## 2010-06-06 NOTE — H&P (Signed)
NAMEJONEL, SICK NO.:  [id number]  MEDICAL RECORD NO.:  [id number]           PATIENT TYPE:  I  LOCATION:  4733                         FACILITY:  [facility]  PHYSICIAN:  Massie Maroon, MD        DATE OF BIRTH:  [birth year]  DATE OF ADMISSION:  06/04/2010 DATE OF DISCHARGE:                             HISTORY & PHYSICAL   CHIEF COMPLAINT:  Shortness of breath.  HISTORY OF PRESENT ILLNESS:  A 65 year old female woke up with shortness of breath and started using her husband's O2 and then asked her husband to take her to the hospital for further evaluation.  Initially, the concern was for congestive heart failure as she had had recent admissions for this.  However chest x-ray showed mild bibasilar pneumonia or patchy atelectasis.  BNP was fairly good at 281.  Prior BNPs have been in the 1200s.  The patient was initially treated with BiPAP while we are waiting chest x-ray and now appears to be doing well on O2 nasal cannula.  She denies any chest pain, palpitations, nausea, vomiting, diaphoresis, lower extremity edema.  Cardiac markers were negative for any acute process.  The patient will be admitted for workup of possible pneumonia.  PAST MEDICAL HISTORY: 1. TIA. 2. COPD. 3. CHF (EF 60-65%), diastolic dysfunction, severe aortic stenosis,     moderate regurgitation, mitral valve moderate regurgitation. 4. Carotid bruit. 5. Pulmonary hypertension. 6. Hypothyroidism. 7. Edema. 8. Thrombocytopenia. 9. Hypokalemia. 10.Venous insufficiency, stable.  PAST SURGICAL HISTORY:  Thyroidectomy, laparoscopic cholecystectomy, lithotripsy for nephrolithiasis, vaginal hysterectomy for menorrhagia at age 14, excision of benign left breast cyst in 1990, right rotator cuff surgery, C-section.  Midline incision x2, lumbar laminectomy.  FAMILY HISTORY:  Father had emphysema.  Sister has allergies.  Sister has asthma.  Father has heart disease.  Mother has rheumatism.   Mother has some type of blood cancer and maternal heart had lung cancer.  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  The patient is a current smoker and started at age 80, two packs per day.  ALLERGIES: 1. PENICILLIN. 2. LATEX.  MEDICATIONS: 1. Metoprolol succinate 50 mg one half p.o. daily. 2. Furosemide 80 mg p.o. daily and 40 mg p.o. q.p.m. 3. Potassium chloride 10 mEq two p.o. b.i.d. 4. Levothyroxine 150 mg p.o. daily. 5. Prozac 20 mg p.o. daily. 6. Enteric-coated aspirin 81 mg p.o. daily. 7. Vitamin D 50,000 international units one p.o. q.2 weeks. 8. Morphine p.r.n. 9. OxyContin 80 mg p.o. b.i.d. p.r.n. 10.Zaroxolyn 3.5 mg p.o. every other day.  PHYSICAL EXAMINATION:  VITAL SIGNS:  Temperature 98.5, pulse 83, blood pressure 141/68, pulse ox is 100%. HEENT:  Anicteric. NECK:  No JVD. HEART:  Regular rate and rhythm.  S1-S2, but 2/6 systolic ejection murmur radiating to the neck. LUNGS:  Clear to auscultation bilaterally. ABDOMEN:  Soft, nontender, and nondistended.  Positive bowel sounds.EXTREMITIES:  No cyanosis, clubbing or edema. SKIN:  No rashes. LYMPH NODES:  No adenopathy. NEUROLOGIC:  Nonfocal.  LABORATORY DATA:  Sodium 145, potassium 3.4, BUN 18, creatinine 1.0.  PH  7.384, pCO2 47.3, pO2 82.0.  Troponin-I less than 0.05.  WBC 5.2, hemoglobin 13.7, platelet count is 76.  BNP 281.  ASSESSMENT AND PLAN: 1. Dyspnea likely secondary to pneumonia:  Treat for community-     acquired pneumonia with Avelox 400 mg IV daily.  Check a sputum     Gram-stain culture.  Check urine Legionella antigen. 2. Congestive heart failure.  Continue Lasix.  Continue metoprolol.     Continue Zaroxolyn.  Continue potassium.  Continue aspirin. 3. Hypothyroidism:  Continue levothyroxine. 4. Vitamin D deficiency.  Continue vitamin D. 5. Chronic pain:  Continue OxyContin. 6. Deep venous thrombosis prophylaxis.  SCDs. 7. ? Possible chronic obstructive  pulmonary disease:  Treat with     Spiriva and Xopenex.     Massie Maroon, MD     JYK/MEDQ  D:  06/04/2010  T:  06/04/2010  Job:  [id number]  cc:   Windle Guard, M.D. Noralyn Pick. Eden Emms, MD, [facility]  Electronically Signed by Pearson Grippe MD on 06/06/2010 02:07:18 AM

## 2010-06-07 NOTE — Discharge Summary (Signed)
[name], [name]                ACCOUNT NO.:  [id number]  MEDICAL RECORD NO.:  [id number]           PATIENT TYPE:  I  LOCATION:  4733                         FACILITY:  [facility]  PHYSICIAN:  Hartley Barefoot, MD    DATE OF BIRTH:  1945-04-29  DATE OF ADMISSION:  06/04/2010 DATE OF DISCHARGE:  06/05/2010                              DISCHARGE SUMMARY   DISCHARGE DIAGNOSES: 1. Community-acquired pneumonia. 2. Mild congestive heart failure exacerbation. 3. Chronic thrombocytopenia.  OTHER PAST MEDICAL HISTORY: 1. TIA. 2. COPD. 3. CHF. 4. Carotid bruits. 5. Pulmonary hypertension. 6. Hypothyroidism. 7. Thrombocytopenia. 8. Hypokalemia. 9. Venous insufficiency.  DISCHARGE MEDICATIONS: 1. Albuterol 90 mcg inhaled q.6 hours. 2. Avelox 400 mg 1 tablet by mouth daily for 3 more days. 3. Furosemide 40 mg by mouth twice daily. 4. Metoprolol 25 mg p.o. daily. 5. Potassium 20 mEq p.o. b.i.d. 6. Oxycodone 80 mg 1/2 tablet by mouth every 12 hours. 7. Estradiol 0.2 mg 1 tablet by mouth daily. 8. Morphine sulfate 15-mg tablet by mouth every 5 hours as needed. 9. Prozac 20 mg by mouth daily. 10.Levothyroxine 150 mcg 1 tablet by mouth daily. 11.Vitamin D 50,000 units 1 tablet by mouth every 2 weeks. 12.Xanax 1 mg 1 tablet by mouth 4 times a day as needed. 13.Medications stopped during this hospitalization aspirin.  DISPOSITION AND FOLLOWUP:  [name] will need to follow up with her primary care physician.  The patient will need to have a CBC to follow platelet levels.  She may need to be referred to a hematologist. Aspirin was on hold due to worsening thrombocytopenia.  She might need pulmonary function test.  BRIEF HISTORY OF PRESENT ILLNESS:  This is a 65 year old, at present complaining of shortness of breath that had started the night prior to admission and cough.  Chest x-ray showed pulmonary edema versus infiltrate.  Next study performed chest x-ray, mild bibasilar  pneumonia or patchy atelectasis.  HOSPITAL COURSE: 1. Dyspnea.  This was likely multifactorial secondary to CHF     exacerbation plus a component of pneumonia.  Her BNP was mildly     elevated at 281.  She takes Lasix every other day.  I changed her     Lasix to 40 mg p.o. b.i.d. daily.  I advised her to take extra     tablet in the morning if she increased more than 3 pounds.  In the     hospital, she was receiving 80 mg in the morning and 40 at night     and she was receiving metolazone.  Her blood pressure has been in     the 100s.  Her Lasix was adjusted to 40 mg p.o. b.i.d.  She was     started also on nebulizer treatment.  She will be discharged on     albuterol and Avelox for 3 more days.  She received 2 days of IV     ceftriaxone and azithromycin.  Her shortness of breath has resolved     and she is willing to go home. 2. Thrombocytopenia, chronic.  Her platelets in 2011 was 88.  On  admission, it was 76, decreased slightly to 60-67.  I will hold     aspirin at this time.  She will need to follow up with her primary     care physician.  She related her mother had a prior history of     platelet disorder.  She will need refer to hematologist. 3. Transaminases.  She had mild elevated liver function test at 40.     Her albumin is low at 2.8.  She might need further evaluation of     the liver function tests and ultrasound as an outpatient. 4. Hypertension.  Continue with metoprolol.  Her pain medication was     adjusted to avoid future hypotension. 5. Heart failure.  Please refer to problem #1. 6. Hypothyroidism.  Continue with levothyroxine.  On the day of discharge, the patient was in improved condition.  Vitals; blood pressure 120/60, sat 94 on room air, respiration 18, pulse 64, temp 98.8.  Sodium 142, potassium 3.5, chloride 108, CO2 30, glucose 126, BUN 15, creatinine 0.84.  Alkaline phosphatase 83, AST 40, albumin 2.8, calcium 8.3.  White blood cells 3.5, hemoglobin  11.8, platelets 67.     Hartley Barefoot, MD     BR/MEDQ  D:  06/05/2010  T:  06/06/2010  Job:  [id number]  cc:   Dr. Janalyn Shy Dr. Gunnar Fusi  Electronically Signed by Hartley Barefoot MD on 06/07/2010 09:59:49 PM

## 2010-06-12 ENCOUNTER — Encounter: Payer: Self-pay | Admitting: Cardiology

## 2010-06-12 ENCOUNTER — Encounter: Payer: Self-pay | Admitting: Cardiovascular Disease

## 2010-06-24 ENCOUNTER — Other Ambulatory Visit: Payer: Self-pay | Admitting: Oncology

## 2010-06-24 ENCOUNTER — Encounter (HOSPITAL_BASED_OUTPATIENT_CLINIC_OR_DEPARTMENT_OTHER): Payer: Self-pay | Admitting: Oncology

## 2010-06-24 DIAGNOSIS — D696 Thrombocytopenia, unspecified: Secondary | ICD-10-CM

## 2010-06-24 DIAGNOSIS — R7402 Elevation of levels of lactic acid dehydrogenase (LDH): Secondary | ICD-10-CM

## 2010-06-24 LAB — CBC WITH DIFFERENTIAL/PLATELET
Basophils Absolute: 0 10*3/uL (ref 0.0–0.1)
Eosinophils Absolute: 0.1 10*3/uL (ref 0.0–0.5)
LYMPH%: 21.7 % (ref 14.0–49.7)
MCV: 88.8 fL (ref 79.5–101.0)
MONO%: 6.1 % (ref 0.0–14.0)
NEUT#: 3.1 10*3/uL (ref 1.5–6.5)
NEUT%: 70.6 % (ref 38.4–76.8)
Platelets: 81 10*3/uL — ABNORMAL LOW (ref 145–400)
RBC: 4.4 10*6/uL (ref 3.70–5.45)

## 2010-06-24 LAB — MORPHOLOGY

## 2010-06-24 LAB — CHCC SMEAR

## 2010-06-25 ENCOUNTER — Other Ambulatory Visit: Payer: Self-pay | Admitting: Oncology

## 2010-06-25 DIAGNOSIS — D696 Thrombocytopenia, unspecified: Secondary | ICD-10-CM

## 2010-06-26 LAB — PROTEIN ELECTROPHORESIS, SERUM
Albumin ELP: 53 % — ABNORMAL LOW (ref 55.8–66.1)
Alpha-1-Globulin: 3.9 % (ref 2.9–4.9)
Alpha-2-Globulin: 10.6 % (ref 7.1–11.8)

## 2010-06-26 LAB — LACTATE DEHYDROGENASE: LDH: 167 U/L (ref 94–250)

## 2010-06-26 LAB — COMPREHENSIVE METABOLIC PANEL
ALT: 22 U/L (ref 0–35)
Alkaline Phosphatase: 110 U/L (ref 39–117)
CO2: 29 mEq/L (ref 19–32)
Sodium: 142 mEq/L (ref 135–145)
Total Bilirubin: 0.7 mg/dL (ref 0.3–1.2)
Total Protein: 6.8 g/dL (ref 6.0–8.3)

## 2010-06-26 LAB — ANTI-NUCLEAR AB-TITER (ANA TITER)

## 2010-06-26 LAB — HIV ANTIBODY (ROUTINE TESTING W REFLEX): HIV: NONREACTIVE

## 2010-06-26 LAB — HEPATITIS B SURFACE ANTIGEN: Hepatitis B Surface Ag: NEGATIVE

## 2010-06-26 LAB — HEPATITIS C ANTIBODY: HCV Ab: NEGATIVE

## 2010-07-03 ENCOUNTER — Ambulatory Visit (HOSPITAL_COMMUNITY)
Admission: RE | Admit: 2010-07-03 | Discharge: 2010-07-03 | Disposition: A | Discharge status: Home / Self Care | Payer: Medicaid Other | Source: Ambulatory Visit | Attending: Oncology | Admitting: Oncology

## 2010-07-03 DIAGNOSIS — R933 Abnormal findings on diagnostic imaging of other parts of digestive tract: Secondary | ICD-10-CM | POA: Insufficient documentation

## 2010-07-03 DIAGNOSIS — K838 Other specified diseases of biliary tract: Secondary | ICD-10-CM | POA: Insufficient documentation

## 2010-07-03 DIAGNOSIS — D696 Thrombocytopenia, unspecified: Secondary | ICD-10-CM

## 2010-07-05 ENCOUNTER — Other Ambulatory Visit: Payer: Self-pay | Admitting: Oncology

## 2010-07-05 DIAGNOSIS — D696 Thrombocytopenia, unspecified: Secondary | ICD-10-CM

## 2010-07-09 NOTE — Consult Note (Signed)
**Note [name]-Identified via Obfuscation** [name], [name] NO.:  [id number]   MEDICAL RECORD NO.:  [id number]          PATIENT TYPE:  OUT   LOCATION:  GYN                          FACILITY:  [facility]   PHYSICIAN:  [name], M.D.DATE OF BIRTH:  24-Aug-1945   DATE OF CONSULTATION:  DATE OF DISCHARGE:                                 CONSULTATION   CHIEF COMPLAINT:  Ovarian cyst, pelvic pain.   HISTORY OF PRESENT ILLNESS:  A 65 year old white female seen in  consultation at the request of Dr. Earney [name] regarding management of  newly diagnosed ovarian cyst.  The patient initially presented because  of new onset of pelvic pain and discomfort.  An ultrasound of the pelvis  was obtained showing cystic lesions in both ovaries, the right ovary  measuring 6.4 x 3.9 x 4.4 cm and the left ovary measuring 5.2 x 2.4 x  4.4 cm.  It is noted that both ovaries have irregular borders and  contain multiple simple cysts.  There is some increased blood flow with  color flow Doppler imaging.  Her CA-125 value is 11.4 U/mL.  Today the  patient reports that she has minimal mild pelvic pain.  She also has  diffuse abdominal pain,  which she has had for a number of years  secondary to a large ventral hernia.  In addition, she has moderate-to-  severe back pain.   PAST MEDICAL HISTORY:  The patient has a significant past medical  history of chronic back pain secondary to disk disease and arthritis.  She is on chronic pain medications for this.  She has a large ventral  hernia which is symptomatic with abdominal pain.  She also has mitral  valve prolapse,  hypothyroidism and depression.   PAST SURGICAL HISTORY:  Thyroidectomy, laparoscopic cholecystectomy,  lithotripsy for nephrolithiasis, vaginal hysterectomy for menorrhagia,  excision of benign left breast cyst, right rotator cuff surgery,  cesarean section through a midline incision (twice), and lumbar  laminectomy.   CURRENT MEDICATIONS:  Estradiol,  hydrochlorothiazide with triamterene,  Prozac, levothyroxine, Cozaar, fentanyl citrate, oxycodone, vitamin D.   DRUG ALLERGIES:  PENICILLIN (HIVES).   FAMILY HISTORY:  Mother with breast cancer.  Maternal grandfather with  colon cancer.  There is no ovarian cancer.   SOCIAL HISTORY:  The patient is married.  She comes accompanied by her  husband and daughter today.  She is a Futures trader.  She smokes 1-1/2 packs  per day for the last 45 years.   OBSTETRICAL HISTORY:  Gravida 2, cesarean section x2.   REVIEW OF SYSTEMS:  A 10-point comprehensive review of systems negative  except as noted above.   PHYSICAL EXAMINATION:  VITAL SIGNS:  Height 5 feet, 1/2 inch.  Weight  239 pounds.  Blood pressure 136/70, pulse 100, respiratory rate 20.  GENERAL:  The patient is an obese white female who smells of tobacco  smoke.  HEENT:  Negative.  NECK:  Supple without thyromegaly.  There is no supraclavicular or  inguinal adenopathy.  ABDOMEN:  Obese.  She has a very large easily reducible ventral hernia.  She has some discomfort on  palpation of the hernia.  Palpation of the  lower abdomen does not elicit any pain.  There is no evidence of  ascites, masses, or organomegaly, although this is somewhat obscured by  the patient's obesity.  PELVIC:  EG, BUS, vagina, bladder, and urethra are normal.  Cervix and  uterus are surgically absent.  On bimanual examination, I [name] not detect  any masses nor [name] I elicit any pain.  EXTREMITIES:  Lower extremities have 2+ ankle edema bilaterally.  It should be noted that the patient says she can only walk approximately  3 to 4 steps without a walker.   IMPRESSION:  Bilateral ovarian cysts, which have some complexity on  ultrasound.  She does have a normal CA-125.  I believe the patient is a  very high risk for surgical intervention.  I doubt that she would  tolerate laparoscopic surgery given her obesity, underlying pulmonary  problems and arthritis, and further  she would be a high risk for  surgical complications in her postoperative recovery period.  There is  certainly a possibility that she could have an ovarian malignancy,  although this may well be a benign ovarian tumor or a borderline ovarian  tumor.  Given the risks versus benefits of going ahead with surgery, I  have discussed this with the patient, her husband and daughter, and we  have chosen to follow this mass to see whether it  changes in character.  We will have her return to Dr. Earney [name] at  the end of September to  undergo repeat ultrasound and make further decisions based on those  findings.      [name], M.D.  Electronically Signed     DC/MEDQ  D:  09/29/2007  T:  09/29/2007  Job:  [id number]   cc:   [name] Boyden, [name]  [facility]  [place], [place]   [name], R.N.  [id number] [address]  [place], [place] [address]

## 2010-07-09 NOTE — Assessment & Plan Note (Signed)
**Note [name]-Identified via Obfuscation** [facility]                            CARDIOLOGY OFFICE NOTE   [name], [name]                       MRN:          [id number]  DATE:11/26/2007                            DOB:          1945-04-10    [name] is a patient I have seen back in 2007.  I do not have any  records from this.  She is referred by [name] for preop clearance.   The patient has coronary risk factors which include hypertension and  smoking.  She has no documented coronary artery disease as far as I can  tell.  She has not had recent stress test.   She needs a right arthroscopic knee surgery for a torn posterior aspect  of the medial meniscus.  She also may need further surgery for ovarian  cyst or tumor.  She has had a long-standing history of a large ventral  hernia.  She has seen [name] and apparently has an  elevated CEA and may need further abdominal surgery.  I explained to the  patient that the arthroscopic knee surgery is not extremely high-risk  abdominal surgery in this morbidly obese smoker would be fraught with  many complications including possible wound healing issues.   From a cardiac perspective, she has chronic exertional dyspnea and  chronic lower extremity edema likely due to venous insufficiency.  She  has no documented vascular disease, but has carotid bruits.   She has not had any significant chest pain.  She has some PND and  orthopnea.   She is a long-time smoker.   Her review of systems is remarkable for no significant cough and no  active wheezing.   She has not had palpitations or syncope.   Her past medical history is remarkable for hypothyroidism, history of  ovarian cysts or tumors which need further workup, a large central  hernia, history of constipation, history of venous insufficiency, lower  extremity edema, history of reflux, question history of mitral  insufficiency and a leaky heart valve and no echo on  record,  hypertension, chronic back pain, and right knee injury.   Family history is noncontributory.   She is allergic to PENICILLIN.   Her current meds include Levoxyl 150 mcg a day, Estrace, triamterene,  hydrochlorothiazide, Prozac 20 a day, Pepcid 20 a day, OxyContin,  vitamin D, lisinopril 10 a day, fentanyl citrate q.i.d., Toprol 50 mg.   The patient is retired.  She is happily married.  Her husband was with  their.  She is sedentary.  She has poor activity.  She smokes at least a  pack a day.  She does not drink.  She needs to have her knee surgery  done to increase her ambulation.   PHYSICAL EXAMINATION:  GENERAL:  Today, her exam is remarkable for a  morbidly obese, bronchitic female.  VITAL SIGNS:  Weight is 230, blood pressure is 130/60, pulse 89 regular,  respiratory rate 14, afebrile.  HEENT:  Remarkable for bilateral carotid bruits.  No lymphadenopathy,  thyromegaly, or JVP elevation.  LUNGS:  Clear.  Good diaphragmatic motion.  No wheezing.  HEART:  S1 and S2 with a soft systolic murmur.  PMI not palpable.  ABDOMEN:  Protuberant with ventral hernia and striae.  No bowel  impingement.  Bowel sounds positive.  No AAA.  No tenderness.  No  hepatosplenomegaly or hepatojugular reflux.  EXTREMITIES:  Distal pulses are intact with chronic venous insufficiency  and discoloration.  PTs are +2 bilaterally.   EKG shows sinus rhythm with a QT interval of 390, LVH, no acute changes.   IMPRESSION:  1. Preop clearance in smoker with carotid bruits.  Followup adenosine      Myoview.  2. History of leaky heart valve and a systolic murmur.  Check 2-D      echocardiogram.  No need for endocarditis prophylaxis.  3. Bilateral carotid bruits in a smoker.  Check carotid duplex to rule      out high-grade internal carotid stenosis and risk for stroke.      Continue aspirin.  4. Hypertension, currently well controlled.  Continue lisinopril 10 mg      a day.  5. Ovarian cyst and/or  tumors.  Follow carcinoembryonic antigen.      Follow up with [name], poor candidate for open      abdominal procedure.  6. Chronic venous insufficiency.  Per [name] note, he would      like her to have a venous duplex to rule out deep vein thrombosis.      She will be a high risk for deep vein thrombosis after her      arthroscopic knee surgery and will have to be on Coumadin for a      short term I suspect.  She will continue her low-dose diuretic for      venous insufficiency and we will check a venous duplex to rule out      clot and get a baseline prior to any knee surgery.  7. Smoking cessation.  The patient is not motivated to quit.  She      understands the long-term risks of cancer and vascular disease.      She does not want to try Chantix at this time.  Counseled her for      less than 10 minutes regarding this.  8. Hypothyroidism.  Continue Synthroid 150 mcg a day.  TSH and T4      should be checked preoperatively if not been done within the last 6      months.  Further recommendation to be based on the results of her      tests.  To decrease her current risk of any complications, she will      be started on Toprol 50 mg a day.  I do not think this will make      her bronchospastic.     [name]. [name] Emms, MD, [facility]  Electronically Signed    PCN/MedQ  DD: 11/26/2007  DT: 11/27/2007  Job #: [id number]

## 2010-07-09 NOTE — Consult Note (Signed)
[name], [name]                ACCOUNT NO.:  [id number]   MEDICAL RECORD NO.:  [id number]          PATIENT TYPE:  OUT   LOCATION:  GYN                          FACILITY:  [facility]   PHYSICIAN:  Paola A. Duard Brady, MD    DATE OF BIRTH:  1945-09-04   DATE OF CONSULTATION:  11/30/2007  DATE OF DISCHARGE:                                 CONSULTATION   Patient is a 65 year old who was seen by Dr. Argentina Ponder on September 29, 2007, for evaluation of ovarian cyst and pelvic pain.  At that time, she  had an ultrasound performed on August 13, 2007, that revealed the right  ovary to measure 6.4 x 3.9 x 4.4 cm and the left ovary to measure 5.2 x  2.4 x 4.4 cm.  Both ovaries were noted to have some irregular borders  and contained multiple simple cysts.  Her CA-125 was normal at 11.4.  The patient had a repeat ultrasound performed.  We have had difficulty  being able to read the fax copies of the ultrasound report due to the  paper it was printed on; however, I called Dr. Earney Hamburg' office this  morning and spoke to her nurse, Lupita Leash, for full details.  Based on the  measurements, the right ovary length, height, and width measured 5.04 x  3.24 x 3.15 for a volume of 26.89.  this is compared to a right ovarian  volume of 55.96 on August 13, 2007.  With regards to the left ovary, the  left ovary similar measurements measured 3.83, 1.13, and 2.44.  It  appears by the report to Lupita Leash that it appears that both ovaries are  smaller.  Evaluating the measurements that we have been provided, it  does appear that the ovarian size overall is smaller.  The comment on  the November 15, 2007, ultrasound reveals 2 anechoic masses less than  1.5 cm seen on the superior and inferior aspects of the left ovary.  There is no blood flow seen associated with these cysts or the ovary  itself.  There is an anechoic mass seen superior and lateral to the  ovary with thin walls and posterior sound enhancement.  It did not  appear that this mass originated from the ovary.  Previously, this mass  in the left measured 2.2 cm.  There is no measurement on the most recent  ultrasound.  I have left it with Lupita Leash that to please let Dr. Aundria Rud-  Yetta Barre know that at this point we believe conservative followup pending  the patient's symptomatologies would be in order; however, if she would  like [place] to see the patient again she is welcome to give [place] a call.      Paola A. Duard Brady, MD  Electronically Signed     PAG/MEDQ  D:  11/30/2007  T:  11/30/2007  Job:  [id number]   cc:   Eustaquio Boyden, MD   Telford Nab, R.N.  [address]  [place], [place] [address]

## 2010-07-09 NOTE — Letter (Signed)
December 06, 2007    Dyke Brackett, MD  [address] [place], [place] [address]   RE:  [name], [name]  MRN:  [id number]  /  DOB:  1945/07/18   Dear Dr. Madelon Lips:   [name] is a 65 year old patient you referred for preop clearance.  She is cleared to have right arthroscopic knee surgery.  She does have  mild-to-moderate aortic stenosis.  We have switched her medicines around  and stopped her ACE inhibitor and started her on Toprol.   Her stress test was negative for ischemia.  She would be considered low  risk for cardiac complication during arthroscopic knee surgery.   If you have any further questions, do not hesitate to contact me.    Sincerely,      Noralyn Pick. Eden Emms, MD, [facility]  Electronically Signed    PCN/MedQ  DD: 12/06/2007  DT: 12/07/2007  Job #: [id number]

## 2010-07-12 ENCOUNTER — Ambulatory Visit
Admission: RE | Admit: 2010-07-12 | Discharge: 2010-07-12 | Disposition: A | Discharge status: Home / Self Care | Payer: No Typology Code available for payment source | Source: Ambulatory Visit | Attending: Oncology | Admitting: Oncology

## 2010-07-12 DIAGNOSIS — D696 Thrombocytopenia, unspecified: Secondary | ICD-10-CM

## 2010-07-12 MED ORDER — GADOBENATE DIMEGLUMINE 529 MG/ML IV SOLN
20.0000 mL | Freq: Once | INTRAVENOUS | Status: AC | PRN
  Administered 2010-07-12: 20 mL via INTRAVENOUS

## 2010-07-12 NOTE — Consult Note (Signed)
[facility]  Patient:    [name], [name]                       MRN: [id number] Proc. Date: 08/21/99 Adm. Date:  [id number] Attending:  Thyra Breed                          Consultation Report  FOLLOW-UP EVALUATION  [place] stopped by today to let me know that she does not want to continue with the OxyContin.  She wishes to go back to the methadone.  She states that the OxyContin is creating some sort of a craving in her and she feels as though she is going through withdrawal.  She does not take it more frequently than she is supposed to.  We discussed this in detail and we will stop the OxyContin and go on methadone 10 mg 2 p.o. q.8h., #84, with no refill.  She is to continue with the Percocet for breakthrough pain. DD:  08/21/99 TD:  08/21/99 Job: [id number]

## 2010-07-12 NOTE — Consult Note (Signed)
[facility]  Patient:    [name], [name]                       MRN: [id number] Proc. Date: 07/01/99 Adm. Date:  [id number] Attending:  Thyra Breed CC:         [name]             [name]             [name]                          Consultation Report  FOLLOWUP EVALUATION:  [name] comes in for followup evaluation of her chronic low back pain on the basis of degenerative disc disease, facet joint arthritis and lumbar spinal stenosis.  She has noted that her back is increasing in severity and feels like she is ready for epidural steroid injections.  She is taking four tablets of 40 mg of OxyContin per day.  She had gotten up to five tablets but we got her cut back.  She does continue to have some discomfort. She has undergone her thyroidectomy but unfortunately, she had some complications from this and plans to see [name]. [name] back later this week.  She ended up with some bleeding around the surgical site and had to go back to surgery and did remain on a ventilator the first night as a safety measure; she is quite frightened as a result.  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  Her medications are otherwise unchanged from previously.  EXAMINATION:  VITAL SIGNS:  Blood pressure 185/64, heart rate is 100, respiratory rate is 20, O2 saturation is 98% and pain level is 8/10.  NEUROLOGIC:  Exam is grossly unchanged.  NECK:  Her neck shows some contusions where she has had her previous surgery and she does have some thickened hardness, especially along the left side of her neck.  IMPRESSION: 1. Low back pain on the basis of lumbar spinal stenosis with some    pseudoclaudication and underlying degenerative disc disease and lumbar    spinal stenosis. 2. Status post thyroidectomy for hyperthyroidism. 3. Other medical problems per [name].  DISPOSITION: 1. Continue on  OxyContin 40 mg, one in the morning, one at midday and two in    the evening, #60 with no refill. 2. Percocet 5/325 mg one p.o. q.6-8h. p.r.n. breakthrough pain, #50 with no    refill. 3. The patient is scheduled to see me back in four weeks.  Advised her that we    probably need to go ahead and switch her over to morphine if she did well    in the hospital; she states she did with this but she is reluctant at this    time. I plan to see her back in followup in four weeks.  She is to swing by    in two weeks for more prescriptions.  I have given her only two weeks at a    time because I am concerned about the amount she is taking.  She needs to    go ahead and get epidural steroid injections performed once her neck heals    a little bit more. DD:  07/01/99 TD:  07/03/99 Job: [id number]

## 2010-07-12 NOTE — Consult Note (Signed)
[facility]  Patient:    [name], [name]                       MRN: [id number] Proc. Date: 07/16/99 Adm. Date:  [id number] Attending:  Thyra Breed                          Consultation Report  [name] stopped by to pick up a prescription for OxyContin.  She is a week early.  She tells me that she has been taking more because she is having more of her neck discomfort.  I put my foot down with her and told her that she could get a weeks worth today, that it would have last a week, and if it did not, then we would have to discuss other options other than continuing treatment with me.  She tells me that her husband was diagnosed as having lung cancer with metastases to his lungs today. DD:  07/16/99 TD:  07/21/99 Job: [id number]

## 2010-07-12 NOTE — Procedures (Signed)
[facility]  Patient:    [name], [name]                       MRN: [id number] Proc. Date: 09/18/99 Adm. Date:  [id number] Attending:  Thyra Breed CC:         Dr. Derenda Fennel L. Franky Macho, M.D.                           Procedure Report  PROCEDURE PERFORMED:  Lumbar epidural steroid injection.  ANESTHESIOLOGIST:  Thyra Breed, M.D.  DIAGNOSIS:  Lumbar spinal stenosis with underlying spondylosis.  INTERVAL HISTORY:  The patient has noted a moderate improvement in her pain. She continues on the methadone 20 mg three times a day with Percocet for breakthrough pain in addition to her other medications.  PHYSICAL EXAMINATION:  Blood pressure 172/61, heart rate 81, respiratory rate 18, oxygen saturations 96%, temperature 98.1.  Her back shows good healing from her previous injection site.  DESCRIPTION OF PROCEDURE:  After informed consent was obtained, the patient was placed in a sitting position and monitored.  Her back was prepped with Betadine x 3.  A skin wheal was raised at the L4-5 interspace with 1% lidocaine.  A 20 gauge Tuohy needle was introduced into the lumbar epidural space with loss of resistance to preservative free normal saline.  There was no CSF or blood.  80 mg of Medrol and 8 ml of preservative free normal saline were gently injected.  The needle was flushed with preservative free normal saline and removed intact.  POSTPROCEDURE CONDITION:  Stable.  DISCHARGE INSTRUCTIONS: 1. Resume previous diet. 2. Limitation in activities per instruction sheet. 3. Continue on current medications. 4. Follow-up in one to two weeks for a third injection. DD:  09/18/99 TD:  09/20/99 Job: [id number] ON/[id number]

## 2010-07-12 NOTE — H&P (Signed)
[facility]  Patient:    [name], [name]                       MRN: [id number] Adm. Date:  [id number] Attending:  Thyra Breed CC:         Georgia Lopes, M.D.   History and Physical  HISTORY OF PRESENT ILLNESS:  [name] comes in for follow-up today.  She tells me that she has used up all of her OxyContin.  I advised her that this was a breach of her contract.  She is fully aware of this.  I advised her that I was going to switch her over to methadone and not give her any more Percocet for the time being.  If she has any more breaches whatsoever, I will be forced to terminate her from my practice.  She is aware of this.  She states that she has had more worrying about a daughter who apparently has some sort of blood cancer and who has been quite sick with bone pain.  This, I think, has been part of her problems with increased pain is the stress of having to do more for her.  She rates her pain at 5/10 today.  She notes that her pain is improved by heat, sitting down, or laying down and made worse by movement such as walking or standing or bending.  CURRENT MEDICATIONS: 1. Levoxyl 150 mcg per day. 2. Cozaar 50 mg per day. 3. Hydrochlorothiazide 25 mg per day. 4. Estradiol. 5. Prozac 20 mg per day. 6. Pepcid 20 mg per day. 7. OxyContin 20 mg.  She is now taking 5 instead of 3 as prescribed. 8. Percocet.  PHYSICAL EXAMINATION:  VITAL SIGNS:  Blood pressure 168/79, heart rate 75, respiratory rate 18.  O2 saturation is 94%.  Pain level is 5/10.  NEUROLOGIC:  She has negative straight leg raise signs with symmetric deep tendon reflexes.  IMPRESSION: 1. Lumbar spondylosis with underlying spinal stenosis. 2. Other medical problems per primary care physician.  DISPOSITION: 1. The patient is aware that she has been noncompliant and is in jeopardy of    being discharged.  We will go ahead and switch her over to methadone 10 mg    2 p.o. q.8h.  #180 with no refill and she is out of the OxyContin. 2. She is to continue on her Percocet for breakthrough pain. 3. Follow up with me in 4-8 weeks. DD:  08/11/00 TD:  08/11/00 Job: [id number] [id number]

## 2010-07-12 NOTE — H&P (Signed)
[facility]  Patient:    [name], [name]                       MRN: [id number] Adm. Date:  [id number] Attending:  Thyra Breed CC:         _____________   History and Physical  FOLLOWUP EVALUATION:  The patient comes in for followup evaluation of her chronic low back pain on the basis of lumbar spondylosis with underlying spinal stenosis.  Since her last evaluation, she has done fairly well on her current medical regimen.  She does note that sitting, lying down and medications as well as the epidural steroids have helped her.  She notes that walking, standing, cleaning house such as mopping or vacuuming will increase her discomfort.  CURRENT MEDICATIONS:  Cozaar, hydrochlorothiazide, estradiol, Levoxyl, Prozac and methadone two and a half tablets every eight hours as well as Percocet 5/325 mg one p.o. b.i.d.  EXAMINATION  VITAL SIGNS:  Blood pressure 140/57, heart rate is 80, respiratory rate is 14, O2 saturation is 92%, pain level is 4/10 and temperature is 98.2.  HEENT:  Head was normocephalic, atraumatic.  NEUROLOGIC:  Deep tendon reflexes in lower extremities were symmetric with negative straight leg raise signs today.  She continues on her pictogram to color in the majority of her body.  IMPRESSION 1. Lumbar spondylosis with underlying spinal stenosis. 2. Other medical problems per ______ .  DISPOSITION 1. Reduce methadone to 10 mg 2 p.o. q.8h., #180 with no refill. 2. Percocet 5/325 mg 1 p.o. q.8h. p.r.n., #90 with no refill. 3. Follow up with me in eight weeks.  She is to swing by in four weeks for    another prescription.  Her medicines should last 30 days and she was    advised of this.  She wishes to try to cut back on the methadone. DD:  01/03/00 TD:  01/04/00 Job: [id number] NF/[id number]

## 2010-07-12 NOTE — Procedures (Signed)
[facility]  Patient:    [name], [name]                       MRN: [id number] Proc. Date: 09/26/99 Adm. Date:  [id number] Attending:  Thyra Breed CC:         Dr. Beverely Risen   Procedure Report  PROCEDURE:  Lumbar epidural steroid injection.  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  INTERVAL HISTORY:  The patient feels markedly improved. She rates her pain at 6/10. She feels as though the methadone and the Percocet are working much better for her overall. She is in good spirits today.  PHYSICAL EXAMINATION:  VITAL SIGNS:  Blood pressure 142/62, heart rate 88, respiratory rate 18, O2 saturations 93%, pain level is 6/10.  BACK:  Shows good healing from a previous injection site.  DESCRIPTION OF PROCEDURE:  After informed consent was obtained, the patient was placed in the sitting position and monitored. The patients back was prepped with Betadine x 3. A skin wheal was raised at the L4-5 interspace with 1 percent lidocaine. A 20 gauge Tuohy needle was introduced to the lumbar epidural space to loss of resistance to preservative free normal saline. There was no cerebrospinal fluid nor blood. 80 mg of Medrol and 10 ml of preservative free normal saline was gently injected. The needle was flushed with preservative free normal saline and removed intact.  CONDITION POST PROCEDURE:  Stable.  DISCHARGE INSTRUCTIONS:  Resume previous diet. Limitations in activities per instruction sheet as outlined by my assistant. Prescription written for Percocet 5/325 1 p.o. q. 6h p.r.n. breakthrough pain #50 with no refill. She is to continue on her methadone at the current dose. Follow-up with me in 4 weeks. She is to let us known when she needs prescriptions for her opiates. DD:  09/26/99 TD:  09/28/99 Job: [id number] NF/[id number]

## 2010-07-12 NOTE — H&P (Signed)
[facility]  Patient:    [name], [name]                       MRN: [id number] Adm. Date:  [id number] Attending:  Thyra Breed CC:         Dr. Izora Ribas   History and Physical  FOLLOW-UP EVALUATION:  Hitomi comes in for follow-up evaluation of her lumbar spondylosis with underlying spinal stenosis.  Since her previous evaluation, the patient has done relatively well, although she has had an upper respiratory tract infection and was coughing quite a bit.  She has recovered from this nicely.  She has also continued to have some problems with constipation.  She states that the 2-1/2 tablets of methadone at 10 mg three times a day helps but makes her sleepy.  She continues to feel like she needs a little bit of Percocet.  Her other medications include Cozaar, hydrochlorothiazide, estradiol, Levoxyl, Prozac.  PHYSICAL EXAMINATION:  VITAL SIGNS:  Blood pressure 128/53, heart rate 91, respiratory rate 12, O saturation is 93%, pain level is 6 out of 10, and temperature is 98 degrees.  MUSCULOSKELETAL/NEUROLOGIC:  The patient on her pictogram actually colors in a great majority of her body.  She states that her pain is made worse by walking, standing, sitting, and doing housework, and improved by lying down and sometimes putting her feet up on a hot pad.  Straight leg raise signs are negative.  Deep tendon reflexes symmetric.  IMPRESSION: 1. Lumbar spondylosis with underlying spinal stenosis. 2. Other medical problems per primary care physician.  DISPOSITION: 1. We will continue on methadone at the current dose. 2. I advised her that I would try her on a little bit of Percocet, but she is    not to take more than two tablets per day, 5/325 one p.o. q.6h. p.r.n., #60    with no refill.  This is to last 30 days.  She is aware of it and    acknowledged this. 3. Continue on other medications. 4. Follow up with me in four weeks. DD:  12/04/99 TD:   12/05/99 Job: [id number]

## 2010-07-12 NOTE — Procedures (Signed)
[facility]  Patient:    [name], [name]                       MRN: [id number] Proc. Date: 04/24/00 Adm. Date:  [id number] Attending:  Thyra Breed CC:         Dr. Beverely Risen   Procedure Report  PROCEDURE:  Lumbar epidural steroid injection.  DIAGNOSIS:  Lumbar spinal stenosis.  INTERVAL HISTORY:  The patient was doing fairly well up until recently when she was developing increasing problems with claudication especially into the left lower extremity. She notes this is brought on by walking and improved by rest to a degree. She has pain the predominantly radiates to the knee region when she walks and described it as an achy discomfort. She denied any new numbness or tingling, bowel or bladder incontinence, and any problems with any new weakness.  She was asking about getting another series of epidural steroid injections.  CURRENT MEDICATIONS:  Hydrochlorothiazide 25 mg per day. Cozaar 50 mg per day. Pepcid 20 mg q. 12 hours. Estradiol 2 mg per day. Levoxyl 150 mcg per day. Prozac 20 mg per day. OxyContin 20 mg b.i.d. Percocet 5/500 q. 6-8h p.r.n.  PHYSICAL EXAMINATION:  The patients afebrile with vital signs stable. She exhibited intact reflux of the lower extremity with negative straight leg raise signs.  DESCRIPTION OF PROCEDURE:  After informed consent was obtained, the patient was placed in the sitting position and monitored. The patients back was prepped with Betadine x 3. A skin wheal was raised at the L4-5 interspace with 1 percent lidocaine. A 20 gauge Tuohy needle was introduced to the lumbar epidural space. There was no fluid backflow on initial presentation but there were a few drops when I went to inject the corticosteroids. I administered 80 mg of Medrol and 8 ml of preservative free normal saline. The needle was flushed with preservative free normal saline and removed intact.  CONDITION POST PROCEDURE:  Stable.  DISCHARGE  INSTRUCTIONS:  Resume previous diet. The patient was encouraged to continue on a copious amount of fluids over the next three days to make sure she does not develop any spinal headaches. Limitations in activities per instruction sheet. Prescription written for OxyContin 20 mg one p.o. q. 8h #90 with no refill. Percocet 5/325 one p.o. q. 6-8h p.r.n. #50 with no refill. Follow-up with me in one week for repeat injection. DD:  04/24/00 TD:  04/27/00 Job: [id number]

## 2010-07-12 NOTE — H&P (Signed)
[facility]  Patient:    [name], [name]                       MRN: [id number] Adm. Date:  [id number] Attending:  Thyra Breed CC:         Dr. Berline Chough   History and Physical  FOLLOWUP EVALUATION  HISTORY OF PRESENT ILLNESS:  [name] comes in for follow-up evaluation of her chronic low back pain on the basis of lumbar spondylosis and underlying spinal stenosis.  Since her last evaluation, over the past six weeks she has developed increasing left lateral ankle discomfort which is made worse by activity and improved by rest.  She does not feel as though the methadone is as helpful as it had been and is asking about going back on the OxyContin.  She has developed intermittent numbness and tingling of her right hand which is made worse with carrying objects.  It is more pronounced in the evenings also.  CURRENT MEDICATIONS: 1. Levoxyl. 2. Cozaar. 3. Hydrochlorothiazide. 4. Estradiol. 5. Prozac. 6. Methadone 10 mg 2 tablets 3 x q.d. 7. Percocet p.r.n.  PHYSICAL EXAMINATION:  VITAL SIGNS:  Blood pressure 146/60, heart rate 97, respiratory rate 18, and O2 saturation 92%.  Pain level 5/10.  NEUROLOGIC:  She demonstrated a positive Tinels and Phalens sign of the right wrist.  Otherwise deep tendon reflexes were symmetric in the upper extremities.  Lower extremities demonstrated knees 2+ and symmetric and ankles 1+ and symmetric.  Straight leg raise signs negative.  IMPRESSION: 1. Lumbar spondylosis with underlying spinal stenosis with some increasing    symptoms with left ankle discomfort. 2. 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 3. Other medical problems per Dr. Berline Chough.  DISPOSITION: 1. Cock-up wrist splint for right wrist to be worn in the evenings. 2. Stop methadone. 3. OxyContin 20 mg 1 p.o. q.8h., #90 with no refill. 4. Continue with Percocet for breakthrough pain. 5. Followup in four weeks for consideration for repeat series  of lumbar    epidural steroid injections. DD:  10/06/00 TD:  10/06/00 Job: [id number] NF/[id number]

## 2010-07-12 NOTE — Procedures (Signed)
[facility]  Patient:    [name], [name]                       MRN: [id number] Proc. Date: 05/11/00 Adm. Date:  [id number] Attending:  Thyra Breed CC:         Georgia Lopes, M.D.   Procedure Report  PROCEDURE:  Lumbar epidural steroid injection.  DIAGNOSIS:  Lumbar spinal stenosis.  INTERVAL HISTORY:  The patients noted marked improvement after her previous two epidurals. She is here for her third today.  Her medications are unchanged.  PHYSICAL EXAMINATION:  Blood pressure 148/56, heart rate 85, respiratory rate 20, O2 saturations 95%, pain level is 5/10 with exercise. Her back shows good healing from a previous injection site.  DESCRIPTION OF PROCEDURE:  After informed consent was obtained, the patient was placed in the sitting position and monitored. The patients back was prepped with Betadine x 3. A skin wheal was raised at the L4-5 interspace with 1 percent lidocaine. A 20 gauge Tuohy needle was introduced to the lumbar epidural space to loss of resistance to preservative free normal saline. There was no cerebrospinal fluid nor blood. 80 mg of Medrol and 8 ml of preservative free normal saline was gently injected. The needle was flushed with preservative free normal saline and removed intact.  CONDITION POST PROCEDURE:  Stable.  DISCHARGE INSTRUCTIONS:  Resume previous diet. Limitations in activities per instruction sheet as outlined by my assistant today. Continue on current medications. She was given a prescription for Percocet 5/325 one p.o. q. 8h p.r.n. breakthrough pain #30. Followup with me in five weeks. She will need to swing by in about 10 days for another prescription for her OxyContin. DD:  05/11/00 TD:  05/12/00 Job: [id number] IO/[id number]

## 2010-07-12 NOTE — Discharge Summary (Signed)
[facility]  Patient:    [name], [name]                       MRN: [id number] Adm. Date:  [id number] Disc. Date: [id number] Attending:  Bonnetta Barry CC:         Fritzi Mandes, M.D.             Dr. 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  BRIEF HISTORY:  Patient is a 65 year old white female seen at the request of Dr. Mertie Clause for evaluation of multinodular goiter with thyrotoxicosis and compressive symptoms.  Patient had been treated for these symptoms since 1994.  She had been on propylthiouracil.  Dr. Criss Alvine had recommended either radioactive iodine ablation or surgical excision for the patients symptoms of dysphagia and choking sensation with liquids.  Patient also describes a tight painful feeling in the neck.  TSH level was recently measured at 0.8.  Patient is admitted at this time for total thyroidectomy.  HOSPITAL COURSE:  Patient was admitted on the morning of June 12, 1999.  She was taken to the operating room where she underwent total thyroidectomy. Postoperative course was complicated that evening by pain and swelling in the neck.  Patient had no respiratory distress.  She was seen by Dr. Cicero Duck and a CT scan of the neck was obtained.  This demonstrated a large hematoma. Patient was then taken back to the operating room where she underwent evacuation of the hematoma.  No frank bleeding source could be identified.  A drain was left in place and the patient was admitted to the intensive care unit for observation.  Postoperative day #1 the patient did well.  There was no evidence of bleeding.  Hemoglobin was stable at 14.8.  Calcium was normal at 9.0.  She was monitored further in the ICU.  She was  then transferred to the floor on the second postoperative day.  She was advanced from clear liquids to a regular diet.  She was prepared for discharge on the third postoperative day, June 15, 1999.  DISCHARGE PLAN:  Patient was discharged home June 15, 1999 in good condition, tolerating a regular diet, and ambulating independently.  She will be seen back in my office at [facility] in 10 days.  She will be followed up by Dr. Mertie Clause in 4-6 weeks.  DISCHARGE MEDICATIONS: 1. Synthroid 150 mcg daily. 2. Calcium carbonate 1250 mg b.i.d. 3. Other medications are as per usual.  FINAL DIAGNOSIS:  Multinodular goiter with signs and symptoms of thyrotoxicosis and compressive symptoms.  CONDITION AT DISCHARGE:  Good. DD:  07/28/99 TD:  07/28/99 Job: [id number]

## 2010-07-12 NOTE — H&P (Signed)
[facility]  Patient:    [name], [name]                       MRN: [id number] Adm. Date:  [id number] Attending:  Thyra Breed CC:         _______________   History and Physical  FOLLOWUP EVALUATION:  The patient comes in for followup evaluation of her chronic low back pain on the basis lumbar spondylosis and spinal stenosis. Since her last evaluation, the patient has noted that she just could not tolerate the methadone any longer so she basically tapered herself off of that; she has taken none in two weeks and she is down to three tablets a day of Percocet.  She states that it is not controlling her pain; her pain level is at 8/10.  She is interested in retrying the OxyContin in low dose again. We discussed this in detail and a need to stick to the dose.  I advised her that I would give her a degree of freedom with the Percocet, but she should limit it to one to three tablets per day if we start back in on the OxyContin. Most of her pain is in the lower back and radiating out into the posterior aspects of her thighs but not past the knees.  It is made worse by activities such as bending or stooping.  She describes no new neurologic symptoms.  PHYSICAL EXAMINATION  VITAL SIGNS:  Blood pressure 178/64, heart rate is 90, respiratory rate is 20, O2 saturation is 95% and temperature is 97.4.  Pain level is 8/10.  BACK:  Her back shows increased pain on hyperextension of the back.  Straight leg raise signs are negative.  NEUROLOGIC:  Deep tendon reflexes are symmetric in the lower extremities.  IMPRESSION 1. Low back pain on the basis of lumbar spondylosis with spinal stenosis. 2. Other medical problems per ______ .  DISPOSITION 1. Discontinue methadone. 2. Percocet 5/325 mg 1 p.o. q.6-8h. p.r.n., #75 with no refill. 3. OxyContin 20 mg 1 p.o. q.12h., #60 with no refill. 4. Follow up with me in four weeks. DD:  03/27/00 TD:  03/28/00 Job:  [id number]

## 2010-07-12 NOTE — H&P (Signed)
[facility]  Patient:    [name], [name]                       MRN: [id number] Adm. Date:  [id number] Attending:  Thyra Breed CC:         Dr. Berline Chough   History and Physical  HISTORY OF PRESENT ILLNESS:  [name] comes in for a further evaluation of her chronic low back pain on the basis of lumbar spondylosis with a spinal stenosis.  Since her last evaluation, she has been relatively stable on her current doses of methadone with Percocet for breakthrough pain.  She does feel as though eventually she will have to go back on the OxyContin.  She really has minimal new complaints today.  She rates her back pain at a level of 5/10. She notes that walking, bending, or reaching will increase her discomfort. Medications, sitting, or lying down will help reduce it.  Additional history: She does have some intermittent numbness and tingling in the fourth and fifth fingers of the right hand especially in the morning when she awakens, but she sleeps with her arm flexed, and I suspect that she is getting some ulnar entrapment when she sleeps at night, and I advised her of this.  CURRENT MEDICATIONS: 1. Levoxyl 150 mcg q.d. 2. Cozaar 50 mg q.d. 3. Hydrochlorothiazide 25 mg q.d. 4. Estradiol 2 mg q.d. 5. Pepcid 20 mg two q.d. 6. Prozac 20 mg q.d. 7. Percocet 5/325 one p.o. q.6-8h. p.r.n. pain.  She got a prescription of    this two weeks ago. 8. Methadone 10 mg two q.8h.  PHYSICAL EXAMINATION:  VITAL SIGNS:  Blood pressure 155/58, heart rate is 92, respiratory rate 20, O2 saturation is 92%.  Pain level is 5/10.  NEUROLOGIC:  She demonstrates negative straight-leg raise signs with symmetric deep tendon reflexes 2+ at the knees and 0 to 1+ at the ankles.  IMPRESSION: 1. Lumbar spondylosis with underlying spinal stenosis. 2. 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 3. Other medical problems per Dr.  Berline Chough.  DISPOSITION: 1. Continue on methadone 10 mg two p.o. q.8h., #180 with no refill. 2. Continue with the Percocet.  She does not need a prescription for this. 3. Follow up with me in four weeks at which time will consider going back on    the OxyContin. DD:  09/08/00 TD:  09/08/00 Job: [id number]

## 2010-07-12 NOTE — Procedures (Signed)
[facility]  Patient:    [name], [name]                       MRN: [id number] Proc. Date: 09/09/99 Adm. Date:  [id number] Attending:  Thyra Breed CC:         Dr. Beverely Risen                           Procedure Report  PROCEDURE:  Lumbar epidural steroid injection.  DIAGNOSIS:  Lumbar spinal stenosis with underlying lumbar spondylosis.  INTERVAL HISTORY:  The patient notes that her pain level is up to 8/10.  She has noted in the past that she had a good response to the epidurals and wishes to resume these.  It has been well over a year since she had these.  PHYSICAL EXAMINATION:  Blood pressure 141/42.  Heart rate is 86.  Respiratory rate 16.  O2 saturations 96%.  Pain level is 8/10, and temperature is 98.2. Her back shows no tenderness to palpation.  Neurologic exam is unchanged from last visit.  DESCRIPTION OF PROCEDURE:  After informed consent was obtained, the patient was placed in the sitting position and monitored.  Her back was prepped with Betadine x 3.  A skin well was placed at the L4-L5 interspace with 1% lidocaine.  A 20 gauge Tuohy needle was introduced into the lumbar epidural space to a loss of resistance to preservative-free normal saline.  There was no CSF nor blood.  Medrol 80 mg in 8 mL of preservative-free normal saline was gently injected.  The needle was flushed with preservative-free normal saline and removed intact.  Postprocedure condition - stable.  DISCHARGE INSTRUCTIONS: 1. Resume previous diet. 2. Limitations on activities per instruction sheet. 3. Continue on current medications. 4. Follow up with me next week for a repeat injection. DD:  09/09/99 TD:  09/09/99 Job: [id number]

## 2010-07-12 NOTE — Consult Note (Signed)
[facility]  Patient:    [name], [name]                       MRN: [id number] Proc. Date: 11/01/99 Adm. Date:  [id number] Attending:  Thyra Breed CC:         Dr. Berline Chough   Consultation Report  FOLLOWUP EVALUATION:  Mikaiya comes in for followup evaluation of her lumbar spondylosis with underlying spinal stenosis.  Since her previous evaluation, she has been relatively stable, although her pain level continues to be about 6/10.  She would like for it to be about 4/10.  She has not been taking any Percocet since the 21st of the month, as she got ahead with that.  We discussed going up on the methadone today.  She seems to be tolerating that well with minimal side-effects.  She notes that her pain is made worse by being up on her legs for quite a while.  It is improved by lying down and the medications.  CURRENT MEDICATIONS:  Cozaar, hydrochlorothiazide, estradiol, levoxyl, Prozac 20 mg per day and methadone 10 mg, two q.8h.  EXAMINATION  VITAL SIGNS:  Blood pressure 166/72.  Heart rate is 91.  Respiratory rate is 16.  O2 saturation is 94%.  Pain level is 6/10.  GENERAL:  She is in good spirits today.  NEUROLOGIC:  Her deep tendon reflexes were symmetric in the lower extremities with negative straight leg raise signs.  BACK:  She has tenderness over the lower lumbar spine to palpation.  IMPRESSION 1. Lumbar spondylosis with underlying lumbar spinal stenosis. 2. Other medical problems per primary care physician.  DISPOSITION 1. We will try her on methadone two and a half tablets q.8h. to see whether    this will help with her discomfort, #120, and get away from the Percocet. 2. She was advised we would hold off on any more Percocet. 3. Continue on current medications. 4. Follow up with me in four weeks. DD:  11/01/99 TD:  11/01/99 Job: [id number]

## 2010-07-12 NOTE — H&P (Signed)
[facility]  Patient:    [name], [name]                       MRN: [id number] Adm. Date:  [id number] Attending:  Thyra Breed CC:         Dr. Hubbard Hartshorn, M.D.                         History and Physical  FOLLOW-UP EVALUATION:  [name] comes in for follow up evaluation of her chronic low back pain on the bases of spinal stenosis and degenerative disc disease. Since her last evaluation, her pain seems to be unchanged on the Methadone. She does not particularly like the Methadone as it has a tendency to make her feel more sedated than the OxyContin and she has more nausea with this.  She complains of some episodes of shortness of breath but no wheezing or cough. These are not persistent to the course of the day.  She continues to be very concerned about her husband who goes into [facility] tomorrow to have a thoracotomy for his lung cancer.  She is very concerned about her follow-up her, as well as her husbands health.  EXAMINATION:  VITAL SIGNS:  Blood pressure was 181/64, heart rate 97, respiratory rate 16, O2 saturations 92%.  Pain level was 8/10.  Temperature is 98.6.  LUNGS:  She demonstrated clear lungs to auscultation.  NEUROLOGICAL EXAM:  Deep tendon reflexes in the lower extremities were symmetric with negative straight leg raise signs.  Motor was 5/5.  IMPRESSION: 1. Lumbar spinal stenosis with underlying degenerative disc disease with    significant side effects to Methadone. 2. Other medical problems per Dr. Berline Chough.  DISPOSITION: 1. Discontinue Methadone. 2. OxyContin 80 mg one p.o. b.i.d. #14 with no refill. 3. Percocet 5/325 one p.o. q.6h. p.r.n. #50 with no refill. 4. Follow-up with me in four weeks or sooner if she is able to arrange to    come in for epidural steroid injections.  The patient is aware that I am giving her 80 mg tablets instead of 40 mg tablets of OxyContin and she is to be very  compliant with regard to her current medications. DD:  08/05/99 TD:  08/05/99 Job: [id number]

## 2010-07-12 NOTE — H&P (Signed)
[facility]  Patient:    [name], [name]                       MRN: [id number] Adm. Date:  [id number] Attending:  Thyra Breed CC:         ______________   History and Physical  FOLLOWUP EVALUATION:  The patient comes in for followup evaluation of her chronic low back pain on the basis of lumbar spondylosis with stenosis.  Since her last evaluation and series of injections, she is doing much better.  She states she is doing pretty good today.  She rates her pain at 5/10.  It is mostly in the lower back and out into the legs and is improved by sitting.  She takes three Percocet per day.  She takes OxyContin 20 mg, two in the morning and one in the evening, Cozaar, hydrochlorothiazide, thyroid, Prozac and estrogen.  PHYSICAL EXAMINATION:  VITAL SIGNS:  Blood pressure 146/75.  Heart rate is 91.  Respiratory rate is 18.  O2 saturation is 93%.  Pain level is 5/10.  NEUROLOGIC:  Straight leg raise signs are negative and deep tendon reflexes were symmetric.  IMPRESSION: 1. Lumbar spondylosis with underlying lumbar spinal stenosis. 2. Other medical problems per primary care physician.  DISPOSITION: 1. Continue on current medical regimen.  The patient needs a prescription for    Percocet today, 5/325 mg, 1 p.o. q.8h. p.r.n.; #90 with no refill was    written. 2. She does not need a prescription for OxyContin until next week and she will    come by to get this. 3. Follow up with me in eight weeks. DD:  06/15/00 TD:  06/16/00 Job: [id number]

## 2010-07-12 NOTE — Procedures (Signed)
[facility]  Patient:    [name], [name]                       MRN: [id number] Proc. Date: 05/01/00 Adm. Date:  [id number] Attending:  Thyra Breed CC:         Dr. Beverely Risen   Procedure Report  PROCEDURE:  Lumbar epidural steroid injection.  DIAGNOSIS:  Lumbar spinal stenosis.  INTERVAL HISTORY:  The patients noted marked improvement after her first injection. She rates her pain at 5/10. Her medications are unchanged. She had no untoward effects.  PHYSICAL EXAMINATION:  Blood pressure 161/60, heart rate 84, respiratory rate 20, O2 saturations 95%, pain level is 5/10. Her back shows good healing from a previous injection. Neuro exam is grossly unchanged.  DESCRIPTION OF PROCEDURE:  After informed consent was obtained, the patient was placed in the sitting position and monitored. The patients back was prepped with Betadine x 3. A skin wheal was raised at the L4-5 interspace with 1 percent lidocaine. A 20 gauge Tuohy needle was introduced to the lumbar epidural space to loss of resistance to preservative free normal saline. There was no cerebrospinal fluid nor blood. 80 mg of Medrol and 8 ml of preservative free normal saline was gently injected. The needle was flushed with preservative free normal saline and removed intact.  CONDITION POST PROCEDURE:  Stable.  DISCHARGE INSTRUCTIONS:  Resume previous diet. Limitations in activities per instruction sheet. Continue on current medications. Follow-up with me next week for repeat injection. DD:  05/01/00 TD:  05/03/00 Job: [id number]

## 2010-07-12 NOTE — H&P (Signed)
[facility]  Patient:    [name], [name]                       MRN: [id number] Adm. Date:  [id number] Attending:  Thyra Breed                         History and Physical  FOLLOW-UP EVALUATION:  HISTORY:  The patient comes in for further evaluation.  She notes that her back  pain radiating out to lower extremities continues to worsen.  The left is greater than the right.  We discussed the possibility of going ahead and restarting the  lumbar epidural steroid injection series again soon and she is very amenable to  this.  MEDICATIONS:  She continues on Cozaar, hydrochlorothiazide, Synthroid, Prozac, Pepcid, Kristalose, Percocet, and methadone as much as previously.  PHYSICAL EXAMINATION:  VITAL SIGNS:  Blood pressure is 154/58, heart rate 68, respiratory rate 14, O2 saturation is 93%.  Pain level is 7 out of 10.  Temperature is 97.7.  EXTREMITIES:  Deep tendon reflexes are 2+ and symmetric at the knees, 1-2+ at the left ankle, absent at the right ankle.  Straight leg raise signs were positive n the right lower extremity.  IMPRESSION: 1. Low back pain on the basis of lumbar spinal stenosis secondary to degenerative    disk disease, which seems to be increasing in severity. 2. Other medical problems per Dr. _____ .  DISPOSITION: 1. Continue on current dose of methadone with prescription written for 10 mg two    p.o. q.8h., #84. 2. Continue on Percocet 5/325 one p.o. q.6h. p.r.n. breakthrough pain, #50 with  no refill. 3. The patient was encouraged to go ahead and schedule back with [place] for a series    of epidural steroid injections.  She is fully aware of the potential risks and    side effects.  She knows that she needs a driver. DD:  09/02/99 TD:  09/02/99 Job: [id number]

## 2010-07-12 NOTE — Consult Note (Signed)
[facility]  Patient:    SIGOURNEY, PORTILLO                       MRN: [id number] Proc. Date: 07/19/99 Adm. Date:  [id number] Attending:  Thyra Breed                          Consultation Report  FOLLOW-UP EVALUATION  HISTORY:  Micky comes in today for follow-up evaluation.  She states that she is not taking her OxyContin appropriately despite being told earlier this week that she had to take it as prescribed.  I advised her that we would need to switch her over to methadone and to try and get her under better control with this.  If she cannot take her medicine as prescribed, then she needs to find another doctor to write her prescription.  She is under a lot of stress with her husband recently being diagnosed as having lung cancer with metastases.  I advised her, since she apparently is taking somewhere between 200-240 mg of OxyContin a day, that I would switch her over to methadone at 80 mg a day for two weeks and then 60 mg a day in the form of 10 mg tablets to be taken initially 2 tablets every 6 hours x 7 days, then p.o. q.8h.  I plan to see her back in follow up in two weeks.  She was advised that she had to adhere to the prescriptions as written.  She stated that if she felt like she was having problems with it she would go to Dr. ___________ and get detoxed.  I advised her that she has to take things as prescribed, otherwise I cannot treat her. She is scheduled to see me back in two weeks. DD:  07/19/99 TD:  07/19/99 Job: [id number]

## 2010-07-18 ENCOUNTER — Other Ambulatory Visit: Payer: Self-pay | Admitting: Cardiovascular Disease

## 2010-07-18 DIAGNOSIS — I739 Peripheral vascular disease, unspecified: Secondary | ICD-10-CM

## 2010-07-18 DIAGNOSIS — R609 Edema, unspecified: Secondary | ICD-10-CM

## 2010-07-19 ENCOUNTER — Other Ambulatory Visit (INDEPENDENT_AMBULATORY_CARE_PROVIDER_SITE_OTHER): Payer: Medicaid Other | Admitting: *Deleted

## 2010-07-19 ENCOUNTER — Encounter: Payer: Self-pay | Admitting: Cardiovascular Disease

## 2010-07-19 ENCOUNTER — Other Ambulatory Visit: Payer: Self-pay | Admitting: *Deleted

## 2010-07-19 ENCOUNTER — Ambulatory Visit (INDEPENDENT_AMBULATORY_CARE_PROVIDER_SITE_OTHER): Payer: Medicaid Other | Admitting: Cardiovascular Disease

## 2010-07-19 ENCOUNTER — Encounter (INDEPENDENT_AMBULATORY_CARE_PROVIDER_SITE_OTHER): Payer: Medicaid Other | Admitting: *Deleted

## 2010-07-19 DIAGNOSIS — I739 Peripheral vascular disease, unspecified: Secondary | ICD-10-CM

## 2010-07-19 DIAGNOSIS — D696 Thrombocytopenia, unspecified: Secondary | ICD-10-CM

## 2010-07-19 DIAGNOSIS — L0390 Cellulitis, unspecified: Secondary | ICD-10-CM | POA: Insufficient documentation

## 2010-07-19 DIAGNOSIS — I70219 Atherosclerosis of native arteries of extremities with intermittent claudication, unspecified extremity: Secondary | ICD-10-CM

## 2010-07-19 DIAGNOSIS — I359 Nonrheumatic aortic valve disorder, unspecified: Secondary | ICD-10-CM

## 2010-07-19 DIAGNOSIS — J189 Pneumonia, unspecified organism: Secondary | ICD-10-CM

## 2010-07-19 DIAGNOSIS — Z79899 Other long term (current) drug therapy: Secondary | ICD-10-CM

## 2010-07-19 DIAGNOSIS — J438 Other emphysema: Secondary | ICD-10-CM

## 2010-07-19 DIAGNOSIS — R609 Edema, unspecified: Secondary | ICD-10-CM

## 2010-07-19 LAB — BASIC METABOLIC PANEL
BUN: 19 mg/dL (ref 6–23)
Calcium: 8.8 mg/dL (ref 8.4–10.5)
GFR: 80.26 mL/min (ref >60.00)
Glucose, Bld: 102 mg/dL — ABNORMAL HIGH (ref 70–99)
Sodium: 135 mEq/L (ref 135–145)

## 2010-07-19 MED ORDER — CEPHALEXIN 500 MG PO CAPS: 500.0000 mg | ORAL_CAPSULE | Freq: Two times a day (BID) | ORAL | Status: AC

## 2010-07-19 NOTE — Assessment & Plan Note (Signed)
Not clearly pancreatic CA.  F/U Dr [name] for EGD and BM biopsy next week.  No bleeding at this point and PLT chronically low

## 2010-07-19 NOTE — Assessment & Plan Note (Signed)
Start Keflex for 2 weeks and F/U Dr Jeannetta Nap. ER if she becomes febrile

## 2010-07-19 NOTE — Assessment & Plan Note (Signed)
Counseled for less than 10 minutes on smoking cessation.  No motivation to quit and husband still smokes with her

## 2010-07-19 NOTE — Assessment & Plan Note (Signed)
Moderate bilateral ABI reduction with possible inflow disease.  F/U Excell Seltzer once other issues resolved

## 2010-07-19 NOTE — Progress Notes (Signed)
[name] is seen today for multiple issues.  I reviewed her echo from 2/12 and she has severe AS.  She is not an operative candidate.  Cath 12/11 showed more moderate AS and no CAD.  She continues to smoke and I explained to her she was killing herself.  No motivation to quit and husband also smokes  Reviewed  ABI's today and PVD has progressed.  Right .46 and Left .57.  She also appears to have severe cellulitis of RLE.  She sees Dr [name] in [facility] but was not placed on antibiotics.  Recent hospitalization in 4/12 for pneumonia.  Has had chronic thrombocytopenia.  Worse lately 67.  Being worked up by Dr [name] and [name].  Reviewed MRI from  5/18 and no pancreatic mass.  Dilated ducts and splenomegaly.  Endocsocpy to be done to R/O ampulary mass.  No fever, SSCP, chronic dyspnea, no palpitations, sycncope.  Legs have chronic LE edema stable but marked warmth and erythema over last 2 weeks.    ROS: Denies fever, malais, weight loss, blurry vision, decreased visual acuity, cough, sputum, SOB, hemoptysis, pleuritic pain, palpitaitons, heartburn, abdominal pain, melena, lower extremity edema, claudication, or rash.   General: Affect appropriate Chronically ill appearing HEENT: normal Neck supple with no adenopathy JVP normal no bruits no thyromegaly Lungs clear with no wheezing and good diaphragmatic motion Heart:  S1/S2 AS  murmur,rub, gallop or click PMI normal Abdomen: benighn, BS positve, no tenderness, no AAA Bilateral femoral  bruit.  No HSM or HJR Distal pulses decreased left greater than right Plus 2 RLE edema and Plus one left Neuro non-focal Skin erythema and warmth with RLE >LLE cellulitis No muscular weakness   Current Outpatient Prescriptions  Medication Sig Dispense Refill  . calcium carbonate 200 MG capsule Take 250 mg by mouth daily.        . ergocalciferol (VITAMIN D2) 50000 UNITS capsule Take 50,000 Units by mouth. Every 2 weeks       . FLUoxetine (PROZAC) 20 MG capsule  Take 20 mg by mouth daily.        . furosemide (LASIX) 40 MG tablet Take 40 mg by mouth 2 (two) times daily.        Marland Kitchen levothyroxine (SYNTHROID, LEVOTHROID) 150 MCG tablet Take 150 mcg by mouth daily.        . metoprolol (TOPROL-XL) 50 MG 24 hr tablet Take One-Half  Tablet By Mouth Daily       . morphine (MSIR) 15 MG tablet Take 15 mg by mouth every 4 (four) hours as needed.        Marland Kitchen oxyCODONE (OXYCONTIN) 80 MG 12 hr tablet Take 80 mg by mouth every 12 (twelve) hours.        . potassium chloride (KLOR-CON) 10 MEQ CR tablet 2 tabs po qam and 1 po qhs      . aspirin 81 MG tablet Take 81 mg by mouth daily.        . cephALEXin (KEFLEX) 500 MG capsule Take 1 capsule (500 mg total) by mouth 2 (two) times daily.  40 capsule  0  . metolazone (ZAROXOLYN) 2.5 MG tablet Take 2.5 mg by mouth every other day.        Marland Kitchen DISCONTD: furosemide (LASIX) 80 MG tablet Take One Tablet By Mouth Daily in The Morning and Take 1/2 Tablet ( 40mg ) in The Evening         Allergies  Latex and Penicillins  Electrocardiogram:  Assessment and Plan

## 2010-07-19 NOTE — Assessment & Plan Note (Signed)
Not an operative candidate due to comorbidities.  NO CAD on cath 12/11.  Not TAVR candidate due to PVD

## 2010-07-19 NOTE — Patient Instructions (Signed)
Your physician recommends that you schedule a follow-up appointment in: 6 months with Dr. Eden Emms  Your physician recommends that you schedule a follow-up appointment in:1 month with Dr. Excell Seltzer for your peripheral vascular problems.

## 2010-07-24 ENCOUNTER — Encounter: Payer: Self-pay | Admitting: Cardiovascular Disease

## 2010-07-31 ENCOUNTER — Other Ambulatory Visit (HOSPITAL_COMMUNITY): Payer: Self-pay

## 2010-07-31 ENCOUNTER — Ambulatory Visit (HOSPITAL_COMMUNITY)
Admission: RE | Admit: 2010-07-31 | Discharge: 2010-07-31 | Disposition: A | Discharge status: Home / Self Care | Payer: Medicaid Other | Source: Ambulatory Visit | Attending: Oncology | Admitting: Oncology

## 2010-07-31 ENCOUNTER — Ambulatory Visit (HOSPITAL_COMMUNITY): Payer: Medicaid Other

## 2010-07-31 ENCOUNTER — Other Ambulatory Visit: Payer: Self-pay | Admitting: Interventional Radiology

## 2010-07-31 DIAGNOSIS — D696 Thrombocytopenia, unspecified: Secondary | ICD-10-CM

## 2010-07-31 LAB — PROTIME-INR
INR: 1.09 (ref 0.00–1.49)
Prothrombin Time: 14.3 seconds (ref 11.6–15.2)

## 2010-07-31 LAB — CBC
Hemoglobin: 13.1 g/dL (ref 12.0–15.0)
MCH: 28.9 pg (ref 26.0–34.0)
RBC: 4.54 MIL/uL (ref 3.87–5.11)
WBC: 5.5 10*3/uL (ref 4.0–10.5)

## 2010-07-31 LAB — APTT: aPTT: 37 seconds (ref 24–37)

## 2010-08-05 ENCOUNTER — Ambulatory Visit (HOSPITAL_COMMUNITY)
Admission: RE | Admit: 2010-08-05 | Discharge: 2010-08-05 | Disposition: A | Discharge status: Home / Self Care | Payer: Self-pay | Source: Ambulatory Visit | Attending: Gastroenterology | Admitting: Gastroenterology

## 2010-08-07 ENCOUNTER — Ambulatory Visit (HOSPITAL_COMMUNITY)
Admission: RE | Admit: 2010-08-07 | Discharge: 2010-08-07 | Disposition: A | Discharge status: Home / Self Care | Payer: Medicaid Other | Source: Ambulatory Visit | Attending: Gastroenterology | Admitting: Gastroenterology

## 2010-08-07 DIAGNOSIS — Z8673 Personal history of transient ischemic attack (TIA), and cerebral infarction without residual deficits: Secondary | ICD-10-CM | POA: Insufficient documentation

## 2010-08-07 DIAGNOSIS — J4489 Other specified chronic obstructive pulmonary disease: Secondary | ICD-10-CM | POA: Insufficient documentation

## 2010-08-07 DIAGNOSIS — R161 Splenomegaly, not elsewhere classified: Secondary | ICD-10-CM | POA: Insufficient documentation

## 2010-08-07 DIAGNOSIS — J449 Chronic obstructive pulmonary disease, unspecified: Secondary | ICD-10-CM | POA: Insufficient documentation

## 2010-08-07 DIAGNOSIS — R1013 Epigastric pain: Secondary | ICD-10-CM | POA: Insufficient documentation

## 2010-08-07 DIAGNOSIS — K838 Other specified diseases of biliary tract: Secondary | ICD-10-CM | POA: Insufficient documentation

## 2010-08-07 DIAGNOSIS — I2789 Other specified pulmonary heart diseases: Secondary | ICD-10-CM | POA: Insufficient documentation

## 2010-08-07 DIAGNOSIS — I509 Heart failure, unspecified: Secondary | ICD-10-CM | POA: Insufficient documentation

## 2010-08-07 DIAGNOSIS — I10 Essential (primary) hypertension: Secondary | ICD-10-CM | POA: Insufficient documentation

## 2010-08-19 ENCOUNTER — Institutional Professional Consult (permissible substitution): Payer: Self-pay | Admitting: Cardiovascular Disease

## 2010-08-22 ENCOUNTER — Other Ambulatory Visit: Payer: Self-pay | Admitting: Oncology

## 2010-08-22 ENCOUNTER — Encounter (HOSPITAL_BASED_OUTPATIENT_CLINIC_OR_DEPARTMENT_OTHER): Payer: Self-pay | Admitting: Oncology

## 2010-08-22 DIAGNOSIS — D696 Thrombocytopenia, unspecified: Secondary | ICD-10-CM

## 2010-08-22 LAB — CBC WITH DIFFERENTIAL/PLATELET
BASO%: 1.2 % (ref 0.0–2.0)
Eosinophils Absolute: 0 10*3/uL (ref 0.0–0.5)
MONO#: 0.3 10*3/uL (ref 0.1–0.9)
NEUT#: 2.9 10*3/uL (ref 1.5–6.5)
Platelets: 99 10*3/uL — ABNORMAL LOW (ref 145–400)
RBC: 4.51 10*6/uL (ref 3.70–5.45)
RDW: 15.9 % — ABNORMAL HIGH (ref 11.2–14.5)
WBC: 4.3 10*3/uL (ref 3.9–10.3)
lymph#: 1 10*3/uL (ref 0.9–3.3)

## 2010-08-22 LAB — COMPREHENSIVE METABOLIC PANEL
ALT: 26 U/L (ref 0–35)
Albumin: 3.6 g/dL (ref 3.5–5.2)
CO2: 32 mEq/L (ref 19–32)
Chloride: 102 mEq/L (ref 96–112)
Glucose, Bld: 127 mg/dL — ABNORMAL HIGH (ref 70–99)
Potassium: 3.9 mEq/L (ref 3.5–5.3)
Sodium: 141 mEq/L (ref 135–145)
Total Protein: 6.4 g/dL (ref 6.0–8.3)

## 2010-08-22 LAB — CANCER ANTIGEN 19-9: CA 19-9: 8.4 U/mL (ref <35.0)

## 2010-10-21 ENCOUNTER — Encounter (HOSPITAL_BASED_OUTPATIENT_CLINIC_OR_DEPARTMENT_OTHER): Payer: Self-pay | Admitting: Oncology

## 2010-10-21 ENCOUNTER — Other Ambulatory Visit: Payer: Self-pay | Admitting: Oncology

## 2010-10-21 DIAGNOSIS — D696 Thrombocytopenia, unspecified: Secondary | ICD-10-CM

## 2010-10-21 LAB — CBC WITH DIFFERENTIAL/PLATELET
BASO%: 0.6 % (ref 0.0–2.0)
EOS%: 1.7 % (ref 0.0–7.0)
LYMPH%: 29.5 % (ref 14.0–49.7)
MCH: 29.6 pg (ref 25.1–34.0)
MCHC: 33.8 g/dL (ref 31.5–36.0)
MONO#: 0.4 10*3/uL (ref 0.1–0.9)
NEUT%: 58.1 % (ref 38.4–76.8)
RBC: 4.46 10*6/uL (ref 3.70–5.45)
WBC: 3.5 10*3/uL — ABNORMAL LOW (ref 3.9–10.3)
lymph#: 1 10*3/uL (ref 0.9–3.3)

## 2010-10-22 ENCOUNTER — Encounter: Payer: Self-pay | Admitting: Cardiovascular Disease

## 2010-10-22 ENCOUNTER — Ambulatory Visit (INDEPENDENT_AMBULATORY_CARE_PROVIDER_SITE_OTHER): Payer: Self-pay | Admitting: Cardiovascular Disease

## 2010-10-22 VITALS — BP 168/80 | HR 75 | Ht 60.0 in | Wt 206.0 lb

## 2010-10-22 DIAGNOSIS — I739 Peripheral vascular disease, unspecified: Secondary | ICD-10-CM

## 2010-10-22 NOTE — Patient Instructions (Signed)
Wear compression stockings as instructed by Dr. Excell Seltzer.  You have prescription for this

## 2010-11-13 ENCOUNTER — Encounter: Payer: Self-pay | Admitting: Cardiovascular Disease

## 2010-11-13 NOTE — Assessment & Plan Note (Signed)
Noninvasive studies suggest significant aortoiliac disease and this patient with multiple medical problems. Her ABIs are in the mild range and she does not have any evidence of critical limb ischemia. The most important intervention for this patient is smoking cessation and I discussed this extensively with her today. I don't think we should pursue angiography as I am not convinced even successful revascularization would improve her quality of life. She also has significant risk with thrombocytopenia and pulmonary disease. This was all discussed with the patient and her husband in detail and they understand. I recommended compression stockings for her chronic leg edema. I would be happy to see her back in followup on an as needed basis.

## 2010-11-13 NOTE — Progress Notes (Signed)
HPI:  65 year old woman referred for evaluation of lower extremity peripheral arterial disease. The patient is chronically ill with COPD, aortic stenosis, recurrent pneumonia, and ongoing tobacco use.  She has had bilateral leg pain and underwent a lower extremity duplex ultrasound in May 2012.  Her ABIs were 70% on the right and 87% on the left. Waveforms were suggestive of significant inflow disease bilaterally.  The patient's main complaint is swelling and redness of the right leg over the past 2-3 years. She and her husband are convinced this is related to pain medication that she takes. The patient's ability to walk is limited by both back and leg pain. She is able to walk further with a walker. She has no history of resting leg pain or ulceration. She has never been diagnosed with DVT. She complains of bilateral leg pain starting in the upper leg and radiating down to the feet. The right side is worse than the left.  Outpatient Encounter Prescriptions as of 10/22/2010  Medication Sig Dispense Refill  . calcium carbonate 200 MG capsule Take 250 mg by mouth daily.        . ergocalciferol (VITAMIN D2) 50000 UNITS capsule Take 50,000 Units by mouth. Every 2 weeks       . FLUoxetine (PROZAC) 20 MG capsule Take 20 mg by mouth daily.        . furosemide (LASIX) 40 MG tablet Take 40 mg by mouth 2 (two) times daily.        Marland Kitchen levothyroxine (SYNTHROID, LEVOTHROID) 150 MCG tablet Take 150 mcg by mouth daily.        . metoprolol (TOPROL-XL) 50 MG 24 hr tablet Take One-Half  Tablet By Mouth Daily       . morphine (MSIR) 15 MG tablet Take 15 mg by mouth every 4 (four) hours as needed.        Marland Kitchen oxyCODONE (OXYCONTIN) 80 MG 12 hr tablet Take 80 mg by mouth every 12 (twelve) hours.        . potassium chloride (KLOR-CON) 10 MEQ CR tablet 2 tabs po qam and 1 po qhs      . DISCONTD: aspirin 81 MG tablet Take 81 mg by mouth daily.        Marland Kitchen DISCONTD: metolazone (ZAROXOLYN) 2.5 MG tablet Take 2.5 mg by mouth every other  day.          Latex and Penicillins  Past Medical History  Diagnosis Date  . Carotid bruit     40-59% bilateral ICA 11/2007  . Aortic stenosis     moderate echo 01/2009  low risk myovue 11/2007 brest attenuation EF  63%  . Venous insufficiency   . Arthritis   . Depression   . COPD (chronic obstructive pulmonary disease)   . Hypothyroidism   . Thrombocytopenia   . Hypokalemia   . Edema   . 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 Thyroidectomy   . Laparoscopic cholecystectomy   . Lithotripsy      for nephrolithiasis  . Vaginal hysterectomy      for menorrhagia  at age 61  . Breast cyst excision   . Rotator cuff repair   . Cesarean section   . Laminectomy     History   Social History  . Marital Status: Married    Spouse Name: N/A    Number of Children: N/A  .  Years of Education: N/A   Occupational History  . Not on file.   Social History Main Topics  . Smoking status: Current Everyday Smoker -- 1.5 packs/day for 45 years  . Smokeless tobacco: Not on file  . Alcohol Use: Not on file  . Drug Use: Not on file  . Sexually Active: Not on file   Other Topics Concern  . Not on file   Social History Narrative   pt is married.pt has children.pt is a housewife.     Family History  Problem Relation Age of Onset  . Emphysema Father   . Allergies Sister   . Asthma Sister   . Heart disease Father   . Cancer Mother     ROS: General: no fevers/chills/night sweats Eyes: no blurry vision, diplopia, or amaurosis ENT: no sore throat or hearing loss Resp: no cough, wheezing, or hemoptysis CV: no edema or palpitations GI: no abdominal pain, nausea, vomiting, diarrhea, or constipation GU: no dysuria, frequency, or hematuria Skin: no rash Neuro: no headache, numbness, tingling, or weakness of extremities Musculoskeletal: no joint pain or  swelling Heme: no bleeding, DVT, or easy bruising Endo: no polydipsia or polyuria  BP 168/80  Pulse 75  Ht 5' (1.524 m)  Wt 206 lb (93.441 kg)  BMI 40.23 kg/m2  PHYSICAL EXAM: Pt is alert and oriented, Chronically ill-appearing woman in no acute distress. HEENT: normal Neck: JVP normal. Carotid upstrokes normal with bilateral bruits. No thyromegaly. Lungs: equal expansion, clear bilaterally CV: Apex is discrete and nondisplaced, RRR with grade 3/6 harsh systolic murmur at the right upper sternal border Abd: soft, NT, +BS, no bruit, no hepatosplenomegaly Back: no CVA tenderness Ext: 1+ edema of the right leg, trace edema on the left. Femoral pulses are 2+. DP pulses are 1+, posterior tibial pulses are nonpalpable Skin: There is chronic erythema of the right leg, no ulceration. Neuro: CNII-XII intact             Strength intact = bilaterally  EKG:  Normal sinus rhythm 75 beats per minute, within normal limits.  ASSESSMENT AND PLAN:

## 2010-12-19 ENCOUNTER — Telehealth: Payer: Self-pay | Admitting: Oncology

## 2010-12-19 ENCOUNTER — Other Ambulatory Visit: Payer: Self-pay | Admitting: Oncology

## 2010-12-19 ENCOUNTER — Ambulatory Visit (HOSPITAL_BASED_OUTPATIENT_CLINIC_OR_DEPARTMENT_OTHER): Payer: Medicaid Other | Admitting: Oncology

## 2010-12-19 DIAGNOSIS — Z23 Encounter for immunization: Secondary | ICD-10-CM

## 2010-12-19 DIAGNOSIS — I5030 Unspecified diastolic (congestive) heart failure: Secondary | ICD-10-CM

## 2010-12-19 DIAGNOSIS — K761 Chronic passive congestion of liver: Secondary | ICD-10-CM

## 2010-12-19 DIAGNOSIS — D696 Thrombocytopenia, unspecified: Secondary | ICD-10-CM

## 2010-12-19 LAB — COMPREHENSIVE METABOLIC PANEL
ALT: 21 U/L (ref 0–35)
AST: 39 U/L — ABNORMAL HIGH (ref 0–37)
Albumin: 3.5 g/dL (ref 3.5–5.2)
Alkaline Phosphatase: 130 U/L — ABNORMAL HIGH (ref 39–117)
Potassium: 4 mEq/L (ref 3.5–5.3)
Sodium: 140 mEq/L (ref 135–145)
Total Bilirubin: 0.5 mg/dL (ref 0.3–1.2)
Total Protein: 6.1 g/dL (ref 6.0–8.3)

## 2010-12-19 LAB — CBC WITH DIFFERENTIAL/PLATELET
BASO%: 0.5 % (ref 0.0–2.0)
EOS%: 2.7 % (ref 0.0–7.0)
HCT: 38 % (ref 34.8–46.6)
HGB: 12.3 g/dL (ref 11.6–15.9)
MCH: 28.7 pg (ref 25.1–34.0)
MCHC: 32.4 g/dL (ref 31.5–36.0)
MONO#: 0.4 10*3/uL (ref 0.1–0.9)
NEUT%: 62.9 % (ref 38.4–76.8)
RDW: 15.5 % — ABNORMAL HIGH (ref 11.2–14.5)
WBC: 4.4 10*3/uL (ref 3.9–10.3)
lymph#: 1.1 10*3/uL (ref 0.9–3.3)
nRBC: 0 % (ref 0–0)

## 2010-12-19 LAB — CHCC SMEAR

## 2010-12-19 NOTE — Telephone Encounter (Signed)
gv pt appt schedule for jan/april. Per HH schedule appts per 10/25 pof + cx all other appts.

## 2010-12-27 ENCOUNTER — Other Ambulatory Visit: Payer: Self-pay | Admitting: Oncology

## 2011-02-10 ENCOUNTER — Other Ambulatory Visit: Payer: Medicaid Other | Admitting: Lab

## 2011-03-19 ENCOUNTER — Other Ambulatory Visit: Payer: Medicaid Other | Admitting: Lab

## 2011-05-09 ENCOUNTER — Other Ambulatory Visit: Payer: Self-pay

## 2011-05-09 ENCOUNTER — Other Ambulatory Visit: Payer: Self-pay | Admitting: Cardiovascular Disease

## 2011-05-09 MED ORDER — METOPROLOL SUCCINATE ER 50 MG PO TB24: 50.0000 mg | ORAL_TABLET | Freq: Every day | ORAL | Status: DC

## 2011-05-09 MED ORDER — METOPROLOL SUCCINATE ER 50 MG PO TB24: ORAL_TABLET | ORAL | Status: DC

## 2011-05-09 NOTE — Telephone Encounter (Signed)
New msg: Pharmacy calling wanting to verify directions for pt metoprolol RX. Please return call to discuss further.

## 2011-05-09 NOTE — Telephone Encounter (Signed)
CALLED PT STRENGTH AND DOSE VERIFIED.

## 2011-06-17 ENCOUNTER — Telehealth: Payer: Self-pay | Admitting: Oncology

## 2011-06-17 NOTE — Telephone Encounter (Signed)
per notes from [facility], cancelled appts for 06/18/2011.  pt is aware

## 2011-06-17 NOTE — Progress Notes (Signed)
Received Referral and office notes from Dr. Reather Littler @ [facility]; forwarded to Dr. Gaylyn Rong.

## 2011-06-18 ENCOUNTER — Ambulatory Visit: Payer: Medicaid Other | Admitting: Oncology

## 2011-06-18 ENCOUNTER — Other Ambulatory Visit: Payer: Medicaid Other | Admitting: Lab

## 2011-07-07 ENCOUNTER — Other Ambulatory Visit: Payer: Self-pay | Admitting: Cardiovascular Disease

## 2011-07-07 MED ORDER — METOPROLOL SUCCINATE ER 50 MG PO TB24: ORAL_TABLET | ORAL | Status: DC

## 2011-10-24 ENCOUNTER — Encounter (HOSPITAL_COMMUNITY): Payer: Self-pay | Admitting: Emergency Medicine

## 2011-10-24 ENCOUNTER — Emergency Department (HOSPITAL_COMMUNITY): Payer: Medicare Other

## 2011-10-24 ENCOUNTER — Inpatient Hospital Stay (HOSPITAL_COMMUNITY)
Admission: EM | Admit: 2011-10-24 | Discharge: 2011-10-30 | DRG: 552 | Disposition: A | Discharge status: Rehabilitation Facility | Payer: Medicare Other | Attending: Internal Medicine | Admitting: Internal Medicine

## 2011-10-24 DIAGNOSIS — F17200 Nicotine dependence, unspecified, uncomplicated: Secondary | ICD-10-CM | POA: Diagnosis present

## 2011-10-24 DIAGNOSIS — D696 Thrombocytopenia, unspecified: Secondary | ICD-10-CM | POA: Diagnosis present

## 2011-10-24 DIAGNOSIS — L0390 Cellulitis, unspecified: Secondary | ICD-10-CM

## 2011-10-24 DIAGNOSIS — J4489 Other specified chronic obstructive pulmonary disease: Secondary | ICD-10-CM | POA: Diagnosis present

## 2011-10-24 DIAGNOSIS — R262 Difficulty in walking, not elsewhere classified: Secondary | ICD-10-CM | POA: Diagnosis present

## 2011-10-24 DIAGNOSIS — J449 Chronic obstructive pulmonary disease, unspecified: Secondary | ICD-10-CM | POA: Diagnosis present

## 2011-10-24 DIAGNOSIS — I359 Nonrheumatic aortic valve disorder, unspecified: Secondary | ICD-10-CM | POA: Diagnosis present

## 2011-10-24 DIAGNOSIS — I5032 Chronic diastolic (congestive) heart failure: Secondary | ICD-10-CM

## 2011-10-24 DIAGNOSIS — M129 Arthropathy, unspecified: Secondary | ICD-10-CM

## 2011-10-24 DIAGNOSIS — I739 Peripheral vascular disease, unspecified: Secondary | ICD-10-CM | POA: Diagnosis present

## 2011-10-24 DIAGNOSIS — M4800 Spinal stenosis, site unspecified: Secondary | ICD-10-CM

## 2011-10-24 DIAGNOSIS — Z6841 Body Mass Index (BMI) 40.0 and over, adult: Secondary | ICD-10-CM

## 2011-10-24 DIAGNOSIS — Z8674 Personal history of sudden cardiac arrest: Secondary | ICD-10-CM

## 2011-10-24 DIAGNOSIS — F329 Major depressive disorder, single episode, unspecified: Secondary | ICD-10-CM | POA: Diagnosis present

## 2011-10-24 DIAGNOSIS — I509 Heart failure, unspecified: Secondary | ICD-10-CM | POA: Diagnosis present

## 2011-10-24 DIAGNOSIS — M48061 Spinal stenosis, lumbar region without neurogenic claudication: Secondary | ICD-10-CM | POA: Diagnosis present

## 2011-10-24 DIAGNOSIS — F3289 Other specified depressive episodes: Secondary | ICD-10-CM | POA: Diagnosis present

## 2011-10-24 DIAGNOSIS — E876 Hypokalemia: Secondary | ICD-10-CM | POA: Diagnosis present

## 2011-10-24 DIAGNOSIS — R29898 Other symptoms and signs involving the musculoskeletal system: Secondary | ICD-10-CM

## 2011-10-24 DIAGNOSIS — E039 Hypothyroidism, unspecified: Secondary | ICD-10-CM | POA: Diagnosis present

## 2011-10-24 HISTORY — DX: Other specified postprocedural states: Z98.890

## 2011-10-24 HISTORY — DX: Peripheral vascular disease, unspecified: I73.9

## 2011-10-24 HISTORY — DX: Other chronic pain: G89.29

## 2011-10-24 HISTORY — DX: Cardiac arrest, cause unspecified: I46.9

## 2011-10-24 LAB — COMPREHENSIVE METABOLIC PANEL WITH GFR
ALT: 19 U/L (ref 0–35)
AST: 31 U/L (ref 0–37)
Albumin: 3.1 g/dL — ABNORMAL LOW (ref 3.5–5.2)
Alkaline Phosphatase: 128 U/L — ABNORMAL HIGH (ref 39–117)
BUN: 20 mg/dL (ref 6–23)
CO2: 32 meq/L (ref 19–32)
Calcium: 8.8 mg/dL (ref 8.4–10.5)
Chloride: 103 meq/L (ref 96–112)
Creatinine, Ser: 0.69 mg/dL (ref 0.50–1.10)
GFR calc Af Amer: >90 mL/min
GFR calc non Af Amer: 89 mL/min — ABNORMAL LOW
Glucose, Bld: 103 mg/dL — ABNORMAL HIGH (ref 70–99)
Potassium: 4 meq/L (ref 3.5–5.1)
Sodium: 140 meq/L (ref 135–145)
Total Bilirubin: 0.6 mg/dL (ref 0.3–1.2)
Total Protein: 6.3 g/dL (ref 6.0–8.3)

## 2011-10-24 LAB — CBC WITH DIFFERENTIAL/PLATELET
Basophils Absolute: 0 K/uL (ref 0.0–0.1)
Basophils Relative: 0 % (ref 0–1)
Eosinophils Absolute: 0.1 K/uL (ref 0.0–0.7)
Eosinophils Relative: 2 % (ref 0–5)
HCT: 37 % (ref 36.0–46.0)
Hemoglobin: 12 g/dL (ref 12.0–15.0)
Lymphocytes Relative: 19 % (ref 12–46)
Lymphs Abs: 1.2 K/uL (ref 0.7–4.0)
MCH: 29 pg (ref 26.0–34.0)
MCHC: 32.4 g/dL (ref 30.0–36.0)
MCV: 89.4 fL (ref 78.0–100.0)
Monocytes Absolute: 0.6 K/uL (ref 0.1–1.0)
Monocytes Relative: 9 % (ref 3–12)
Neutro Abs: 4.3 K/uL (ref 1.7–7.7)
Neutrophils Relative %: 70 % (ref 43–77)
Platelets: 93 K/uL — ABNORMAL LOW (ref 150–400)
RBC: 4.14 MIL/uL (ref 3.87–5.11)
RDW: 14.5 % (ref 11.5–15.5)
WBC: 6.2 K/uL (ref 4.0–10.5)

## 2011-10-24 MED ORDER — SODIUM CHLORIDE 0.9 % IV SOLN
INTRAVENOUS | Status: DC
  Administered 2011-10-24: 17:00:00 via INTRAVENOUS

## 2011-10-24 MED ORDER — VANCOMYCIN HCL IN DEXTROSE 1-5 GM/200ML-% IV SOLN
1000.0000 mg | Freq: Once | INTRAVENOUS | Status: AC
  Administered 2011-10-25: 1000 mg via INTRAVENOUS
  Filled 2011-10-24: qty 200

## 2011-10-24 MED ORDER — LORAZEPAM 2 MG/ML IJ SOLN
2.0000 mg | Freq: Once | INTRAMUSCULAR | Status: AC
  Administered 2011-10-24: 2 mg via INTRAVENOUS
  Filled 2011-10-24: qty 1

## 2011-10-24 NOTE — ED Provider Notes (Signed)
History     CSN: [id number]  Arrival date & time 10/24/11  1434   First MD Initiated Contact with Patient 10/24/11 1550      Chief Complaint  Patient presents with  . Back Pain    (Consider location/radiation/quality/duration/timing/severity/associated sxs/prior treatment) Patient is a 66 y.o. female presenting with back pain. The history is provided by the patient and a relative.  Back Pain    patient here with worsening lower extremity weakness and back pain x1 week. History of spinal stenosis and no prior surgery. Now states that she is unable to ambulate without severe pain and that she is dragging her right foot. No loss of bowel or bladder function. Denies any numbness in her perineum. Called her neurosurgeon today to schedule a followup visit but because of her worsening weakness she was sent here for evaluation. Denies any falls. Pain is sharp and worse with movement and better with nothing. She does take morphine at home for her chronic back pain  She also notes increased redness to her right lower extremity which is been a chronic issue. There is also left knee pain worse with walking she has a history of rheumatoid arthritis  Past Medical History  Diagnosis Date  . Carotid bruit     40-59% bilateral ICA 11/2007  . Aortic stenosis     moderate echo 01/2009  low risk myovue 11/2007 brest attenuation EF  63%  . Venous insufficiency   . Arthritis   . Depression   . COPD (chronic obstructive pulmonary disease)   . Hypothyroidism   . Thrombocytopenia   . Hypokalemia   . Edema   . 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 Thyroidectomy   . Laparoscopic cholecystectomy   . Lithotripsy      for nephrolithiasis  . Vaginal hysterectomy      for menorrhagia  at age 41  . Breast cyst excision   . Rotator cuff repair   . Cesarean section   . Laminectomy      Family History  Problem Relation Age of Onset  . Emphysema Father   . Allergies Sister   . Asthma Sister   . Heart disease Father   . Cancer Mother     History  Substance Use Topics  . Smoking status: Current Everyday Smoker -- 1.5 packs/day for 45 years  . Smokeless tobacco: Not on file  . Alcohol Use: Not on file    OB History    Grav Para Term Preterm Abortions TAB SAB Ect Mult Living                  Review of Systems  Musculoskeletal: Positive for back pain.  All other systems reviewed and are negative.    Allergies  Latex and Penicillins  Home Medications   Current Outpatient Rx  Name Route Sig Dispense Refill  . CALCIUM CARBONATE 200 MG PO CAPS Oral Take 250 mg by mouth daily.      . ERGOCALCIFEROL 50000 UNITS PO CAPS Oral Take 50,000 Units by mouth. Every 2 weeks     . FLUOXETINE HCL 20 MG PO CAPS Oral Take 20 mg by mouth daily.      . FUROSEMIDE 40 MG PO TABS Oral Take 40 mg by mouth 2 (two) times daily.      Marland Kitchen LEVOTHYROXINE SODIUM 150 MCG PO TABS Oral Take 150 mcg  by mouth daily.      Marland Kitchen METOPROLOL SUCCINATE ER 50 MG PO TB24  Take half a tablet daily (25 mg) 100 tablet 3    DO NOT FILL TILL PT CALLS FOR IT.  Marland Kitchen MORPHINE SULFATE 15 MG PO TABS Oral Take 15 mg by mouth every 4 (four) hours as needed.      . OXYCODONE HCL ER 80 MG PO TB12 Oral Take 80 mg by mouth every 12 (twelve) hours.      Marland Kitchen POTASSIUM CHLORIDE 10 MEQ PO TBCR  2 tabs po qam and 1 po qhs      BP 113/52  Pulse 71  Temp 98.3 F (36.8 C) (Oral)  Resp 24  SpO2 99%  Physical Exam  Nursing note and vitals reviewed. Constitutional: She is oriented to person, place, and time. She appears well-developed and well-nourished.  Non-toxic appearance. No distress.  HENT:  Head: Normocephalic and atraumatic.  Eyes: Conjunctivae, EOM and lids are normal. Pupils are equal, round, and reactive to light.  Neck: Normal range of motion. Neck supple. No tracheal deviation present. No mass present.   Cardiovascular: Normal rate, regular rhythm and normal heart sounds.  Exam reveals no gallop.   No murmur heard. Pulmonary/Chest: Effort normal and breath sounds normal. No stridor. No respiratory distress. She has no decreased breath sounds. She has no wheezes. She has no rhonchi. She has no rales.  Abdominal: Soft. Normal appearance and bowel sounds are normal. She exhibits no distension. There is no tenderness. There is no rebound and no CVA tenderness.  Musculoskeletal: Normal range of motion. She exhibits no edema and no tenderness.       Left knee with limited range of motion. Skin intact without erythema or bruising.  Right lower extremity with erythema from anterior tibia down to the top of her foot. No crepitus. One Touch. Both lower extremities with 2-3+ pitting edema  Neurological: She is alert and oriented to person, place, and time. No cranial nerve deficit or sensory deficit. GCS eye subscore is 4. GCS verbal subscore is 5. GCS motor subscore is 6.       Right lower extremity  with 2/5 strength. Left lower extremity with 3\ 5 strength  Skin: Skin is warm and dry. No abrasion and no rash noted.  Psychiatric: She has a normal mood and affect. Her speech is normal and behavior is normal.    ED Course  Procedures (including critical care time)   Labs Reviewed  CBC WITH DIFFERENTIAL  COMPREHENSIVE METABOLIC PANEL   No results found.   No diagnosis found.    MDM  Patient given Ativan for muscle spasm and also for anxiety so she can have MRI. Patient's MRI shows severe spinal stenosis. Spoke with the neurosurgeon on call, Dr. Newell Coral, he requested the patient be transferred to [facility] he was seen the patient in consultation. Patient also has possible early cellulitis of the right lower extremity and has been started on vancomycin. I spoke with [facility] and they will arrange admission        Toy Baker, MD 10/24/11 2318

## 2011-10-24 NOTE — ED Notes (Signed)
Dr Allen at bedside  

## 2011-10-24 NOTE — ED Notes (Signed)
Patient states her chronic lower back pain (w/h/o degenerative disc dx) got worse two weeks ago.  Patient is ambulatory with wheelchair assistance at home.

## 2011-10-24 NOTE — ED Notes (Signed)
Pt transported to MRI 

## 2011-10-24 NOTE — ED Notes (Signed)
Patient transported to X-ray 

## 2011-10-24 NOTE — ED Notes (Signed)
Pt returned from xray. NAD noted.

## 2011-10-25 DIAGNOSIS — M4800 Spinal stenosis, site unspecified: Secondary | ICD-10-CM

## 2011-10-25 DIAGNOSIS — I359 Nonrheumatic aortic valve disorder, unspecified: Secondary | ICD-10-CM

## 2011-10-25 DIAGNOSIS — M48061 Spinal stenosis, lumbar region without neurogenic claudication: Secondary | ICD-10-CM | POA: Diagnosis present

## 2011-10-25 HISTORY — DX: Spinal stenosis, lumbar region without neurogenic claudication: M48.061

## 2011-10-25 LAB — BASIC METABOLIC PANEL
BUN: 17 mg/dL (ref 6–23)
Chloride: 104 mEq/L (ref 96–112)
GFR calc Af Amer: >90 mL/min (ref >90)
Potassium: 4.5 mEq/L (ref 3.5–5.1)

## 2011-10-25 LAB — CBC
HCT: 37.4 % (ref 36.0–46.0)
Platelets: 90 10*3/uL — ABNORMAL LOW (ref 150–400)
RDW: 14.8 % (ref 11.5–15.5)
WBC: 6 10*3/uL (ref 4.0–10.5)

## 2011-10-25 LAB — PROTIME-INR
INR: 1.08 (ref 0.00–1.49)
Prothrombin Time: 14.2 seconds (ref 11.6–15.2)

## 2011-10-25 MED ORDER — MORPHINE SULFATE 15 MG PO TABS
15.0000 mg | ORAL_TABLET | ORAL | Status: DC | PRN
  Administered 2011-10-25 – 2011-10-30 (×8): 15 mg via ORAL
  Filled 2011-10-25 (×5): qty 1
  Filled 2011-10-25: qty 8
  Filled 2011-10-25 (×3): qty 1

## 2011-10-25 MED ORDER — POTASSIUM CHLORIDE CRYS ER 20 MEQ PO TBCR
20.0000 meq | EXTENDED_RELEASE_TABLET | Freq: Every day | ORAL | Status: DC
  Administered 2011-10-25 – 2011-10-27 (×3): 20 meq via ORAL
  Filled 2011-10-25 (×4): qty 1

## 2011-10-25 MED ORDER — ONDANSETRON HCL 4 MG PO TABS: 4.0000 mg | ORAL_TABLET | Freq: Four times a day (QID) | ORAL | Status: DC | PRN

## 2011-10-25 MED ORDER — ENOXAPARIN SODIUM 60 MG/0.6ML ~~LOC~~ SOLN
60.0000 mg | SUBCUTANEOUS | Status: DC
  Administered 2011-10-25 – 2011-10-30 (×6): 60 mg via SUBCUTANEOUS
  Filled 2011-10-25 (×6): qty 0.6

## 2011-10-25 MED ORDER — FUROSEMIDE 80 MG PO TABS
80.0000 mg | ORAL_TABLET | Freq: Every day | ORAL | Status: DC
  Administered 2011-10-25 – 2011-10-27 (×3): 80 mg via ORAL
  Filled 2011-10-25 (×4): qty 1

## 2011-10-25 MED ORDER — DOCUSATE SODIUM 100 MG PO CAPS
100.0000 mg | ORAL_CAPSULE | Freq: Two times a day (BID) | ORAL | Status: DC
  Administered 2011-10-25 – 2011-10-30 (×10): 100 mg via ORAL
  Filled 2011-10-25 (×11): qty 1

## 2011-10-25 MED ORDER — SENNOSIDES-DOCUSATE SODIUM 8.6-50 MG PO TABS
1.0000 | ORAL_TABLET | Freq: Every evening | ORAL | Status: DC | PRN
  Administered 2011-10-25 – 2011-10-26 (×2): 1 via ORAL
  Filled 2011-10-25 (×2): qty 1

## 2011-10-25 MED ORDER — LEVOTHYROXINE SODIUM 137 MCG PO TABS
137.0000 ug | ORAL_TABLET | Freq: Every day | ORAL | Status: DC
  Administered 2011-10-25 – 2011-10-30 (×6): 137 ug via ORAL
  Filled 2011-10-25 (×7): qty 1

## 2011-10-25 MED ORDER — FLUOXETINE HCL 20 MG PO CAPS
20.0000 mg | ORAL_CAPSULE | Freq: Every day | ORAL | Status: DC
  Administered 2011-10-25 – 2011-10-30 (×6): 20 mg via ORAL
  Filled 2011-10-25 (×6): qty 1

## 2011-10-25 MED ORDER — ALUM & MAG HYDROXIDE-SIMETH 200-200-20 MG/5ML PO SUSP: 30.0000 mL | Freq: Four times a day (QID) | ORAL | Status: DC | PRN

## 2011-10-25 MED ORDER — MORPHINE SULFATE ER 15 MG PO TBCR
120.0000 mg | EXTENDED_RELEASE_TABLET | Freq: Two times a day (BID) | ORAL | Status: DC
  Administered 2011-10-25: 120 mg via ORAL
  Filled 2011-10-25: qty 8

## 2011-10-25 MED ORDER — METOPROLOL SUCCINATE ER 25 MG PO TB24
25.0000 mg | ORAL_TABLET | Freq: Every day | ORAL | Status: DC
  Administered 2011-10-25 – 2011-10-30 (×6): 25 mg via ORAL
  Filled 2011-10-25 (×6): qty 1

## 2011-10-25 MED ORDER — MORPHINE SULFATE ER 60 MG PO TBCR
120.0000 mg | EXTENDED_RELEASE_TABLET | Freq: Two times a day (BID) | ORAL | Status: DC
Start: 2011-10-25 — End: 2011-10-30
  Administered 2011-10-25 – 2011-10-30 (×11): 120 mg via ORAL
  Filled 2011-10-25 (×10): qty 2

## 2011-10-25 MED ORDER — ONDANSETRON HCL 4 MG/2ML IJ SOLN: 4.0000 mg | Freq: Four times a day (QID) | INTRAMUSCULAR | Status: DC | PRN

## 2011-10-25 MED ORDER — ACETAMINOPHEN 650 MG RE SUPP: 650.0000 mg | Freq: Four times a day (QID) | RECTAL | Status: DC | PRN

## 2011-10-25 MED ORDER — MORPHINE SULFATE 2 MG/ML IJ SOLN: 2.0000 mg | INTRAMUSCULAR | Status: DC | PRN

## 2011-10-25 MED ORDER — ACETAMINOPHEN 325 MG PO TABS: 650.0000 mg | ORAL_TABLET | Freq: Four times a day (QID) | ORAL | Status: DC | PRN

## 2011-10-25 NOTE — Progress Notes (Signed)
  Echocardiogram 2D Echocardiogram has been performed.  [name] 10/25/2011, 3:44 PM

## 2011-10-25 NOTE — Progress Notes (Signed)
Physical Therapy Evaluation Patient Details Name: [name] MRN: [id number] DOB: 1945/09/01 Today's Date: 10/25/2011 Time: 7829-5621 PT Time Calculation (min): 24 min  PT Assessment / Plan / Recommendation Clinical Impression  Pt is 66 yo female with LBP and LE weakness that has been present for years but has worsened over the last 2 wks to the point that she cannot ambulate. At this point, pt is very limited by pain and weakness, recommend CIR consult.  PT will follow acutely to help pt mobilize and increase independence.    PT Assessment  Patient needs continued PT services    Follow Up Recommendations  Inpatient Rehab;Supervision/Assistance - 24 hour    Barriers to Discharge Inaccessible home environment      Equipment Recommendations  Tub/shower bench    Recommendations for Other Services Rehab consult   Frequency Min 3X/week    Precautions / Restrictions Precautions Precautions: Fall Restrictions Weight Bearing Restrictions: No   Pertinent Vitals/Pain 8/10 back pain, nsg aware      Mobility  Bed Mobility Bed Mobility: Sit to Supine Sit to Supine: 1: +2 Total assist;HOB flat Sit to Supine: Patient Percentage: 30% Details for Bed Mobility Assistance: pt unable to get legs into bed and has difficulty repositioning self in bed.  Her husband reports that this was a big issue at home because he could not get her comfortably into the bed so she would sit EOB and often doze, and never really get good sleep Transfers Transfers: Sit to Stand;Stand to Sit;Stand Pivot Transfers Sit to Stand: From bed;From chair/3-in-1;With upper extremity assist;1: +2 Total assist Sit to Stand: Patient Percentage: 70% Stand to Sit: 1: +2 Total assist;To chair/3-in-1;To bed;With upper extremity assist Stand to Sit: Patient Percentage: 80% Stand Pivot Transfers: 1: +2 Total assist (pt 70%) Stand Pivot Transfers: Patient Percentage: 70% Details for Transfer Assistance: RW used for transfers,  pt has difficulty advancing feet, especially the right foot, she also lacks knee control R>L, extreme fwd flexion of torso which improved minimally with verbal and tactile cues.  Pt also lethargic and slightly confused when cued which way to turn RW and she could not figure it out on her own Ambulation/Gait Ambulation/Gait Assistance: Not tested (comment) (unable) Stairs: No Wheelchair Mobility Wheelchair Mobility: No    Exercises General Exercises - Lower Extremity Ankle Circles/Pumps: AROM;Both;20 reps;Supine Quad Sets: AROM;Both;10 reps;Supine;Limitations Quad Sets Limitations: Note: pt able to perform all of the following exercises through only partial ROM but instructed to do so anyway to increase mobility in the bed Heel Slides: AROM;10 reps;Both;Supine Straight Leg Raises: AROM;Both;10 reps;Supine   PT Diagnosis: Difficulty walking;Abnormality of gait;Generalized weakness;Acute pain  PT Problem List: Decreased strength;Decreased range of motion;Decreased activity tolerance;Decreased balance;Decreased mobility;Decreased coordination;Decreased cognition;Decreased knowledge of use of DME;Decreased safety awareness;Decreased knowledge of precautions;Pain;Obesity PT Treatment Interventions: DME instruction;Gait training;Functional mobility training;Therapeutic activities;Therapeutic exercise;Balance training;Patient/family education   PT Goals Acute Rehab PT Goals PT Goal Formulation: With patient/family Time For Goal Achievement: 11/08/11 Potential to Achieve Goals: Fair Pt will go Supine/Side to Sit: with supervision PT Goal: Supine/Side to Sit - Progress: Goal set today Pt will go Sit to Supine/Side: with supervision PT Goal: Sit to Supine/Side - Progress: Goal set today Pt will go Sit to Stand: with supervision PT Goal: Sit to Stand - Progress: Goal set today Pt will go Stand to Sit: with supervision PT Goal: Stand to Sit - Progress: Goal set today Pt will Transfer Bed to  Chair/Chair to Bed: with supervision PT Transfer Goal: Bed  to Chair/Chair to Bed - Progress: Goal set today Pt will Ambulate: 16 - 50 feet;with min assist;with rolling walker PT Goal: Ambulate - Progress: Goal set today Pt will Perform Home Exercise Program: Independently PT Goal: Perform Home Exercise Program - Progress: Goal set today  Visit Information  Last PT Received On: 10/25/11 Assistance Needed: +2 (safety)    Subjective Data  Subjective: "for the past 2 wks I haven't been able to walk at all" Patient Stated Goal: return home and be able to walk   Prior Functioning  Home Living Lives With: Spouse Available Help at Discharge: Available 24 hours/day Type of Home: House Home Access: Stairs to enter [employer] of Steps: 3 Home Layout: One level Bathroom Shower/Tub: Engineer, manufacturing systems: Standard Home Adaptive Equipment: Bedside commode/3-in-1;Walker - four wheeled;Wheelchair - manual Additional Comments: pt's husband has been using a makeshift ramp to get pt ina nd out of house in w/c but is working on having a real ramp built.  He reports that she has ambulated poorly with increased flexion and back pain for 13 yrs but in the past 2 wks she has not been able to ambulate at all due to pain and leg weakness  Prior Function Level of Independence: Needs assistance Needs Assistance: Bathing;Dressing;Grooming;Toileting;Meal Prep;Light Housekeeping;Gait;Transfers Meal Prep: Total Light Housekeeping: Total Gait Assistance: assist with RW and most recently, push in w/c Able to Take Stairs?: No Driving: No Vocation: Retired Musician: No difficulties    Cognition  Overall Cognitive Status: Impaired Area of Impairment: Attention;Following commands Arousal/Alertness: Lethargic Orientation Level: Appears intact for tasks assessed Behavior During Session: Lethargic Current Attention Level: Sustained Attention - Other Comments: decreased  attention seems due to lethargy Following Commands: Follows one step commands with increased time    Extremity/Trunk Assessment Right Lower Extremity Assessment RLE ROM/Strength/Tone: Deficits RLE ROM/Strength/Tone Deficits: hip flex 2-/5, knee ext2-/5, knee flex 2-/5 RLE Sensation: Deficits RLE Sensation Deficits: cellulitis RLE>LLE RLE Coordination: Deficits RLE Coordination Deficits: decreased coordination of mvmt with stepping R>L Left Lower Extremity Assessment LLE ROM/Strength/Tone: Deficits LLE ROM/Strength/Tone Deficits: hip flex 2/5, knee ext 1/5, knee flex 2-/5 (pt reports bone spurs in this knee prevent mvmt) LLE Sensation: Deficits LLE Sensation Deficits: decreased sensation due to cellulitis LLE Coordination: Deficits LLE Coordination Deficits: improved coordination compared to right but not WFL, vc's needed for each mvmt of LE Trunk Assessment Trunk Assessment: Kyphotic   Balance Balance Balance Assessed: Yes Dynamic Standing Balance Dynamic Standing - Balance Support: Bilateral upper extremity supported;During functional activity Dynamic Standing - Level of Assistance: 4: Min assist  End of Session PT - End of Session Equipment Utilized During Treatment: Gait belt Activity Tolerance: Treatment limited secondary to medication;Patient limited by pain;Other (comment) (limited by weakness) Patient left: in bed;with call bell/phone within reach;with family/visitor present Nurse Communication: Mobility status  GP   Lyanne Co, PT  Acute Rehab Services  [phone]   Lyanne Co 10/25/2011, 12:04 PM

## 2011-10-25 NOTE — Progress Notes (Signed)
Patient seen and examined. Admitted earlier today for progressive leg weakness and pain leading to inability to walk. Apparently she has been having difficulties walking for about 7 years. Follows with Dr. Yetta Barre (neurosurgery). For the past 2 weeks has been pretty much wheelchair bound. She called Dr. Yetta Barre yesterday to schedule an appointment and he told her to come to the hospital because nothing could be done for her from the office. MRI here shows progression of severe spinal stenosis at multiple levels. Dr. Newell Coral has been consulted and we are waiting on him to review the MRI and examine patient for further recommendations. Will see if PT can assess today. Will continue to follow.  Peggye Pitt, MD [facility] Pager: [phone]

## 2011-10-25 NOTE — H&P (Addendum)
PCP:   [name], MD   Chief Complaint:  Back pain and inability to walk  HPI: This is a morbidly obese 66 year old female who states approximately week and a half ago she developed numbness and pain in the bilateral lower extremities and in her back. She states that her left leg hurts worse in the right. She's after dragging the right leg. She states she's unable to lift her legs. She states the pain starts in her back and radiates down the back of her legs to her bilateral knees. She states she's unable to stand up. She finally came to ER today. She does report some nausea but no vomiting she does report some subjective fevers but no chills. She reports no incontinence of bladder or bowel. She does report chronic constipation. History provided to the patient her husband at the bedside.  Review of Systems:  The patient denies anorexia, fever, weight loss,, vision loss, decreased hearing, hoarseness, chest pain, syncope, dyspnea on exertion, peripheral edema, balance deficits, hemoptysis, abdominal pain, melena, hematochezia, severe indigestion/heartburn, hematuria, incontinence, genital sores, muscle weakness, suspicious skin lesions, transient blindness, depression, unusual weight change, abnormal bleeding, enlarged lymph nodes, angioedema, and breast masses.  Past Medical History: Past Medical History  Diagnosis Date  . Carotid bruit     40-59% bilateral ICA 11/2007  . Aortic stenosis     moderate echo 01/2009  low risk myovue 11/2007 brest attenuation EF  63%  . Venous insufficiency   . Arthritis   . Depression   . COPD (chronic obstructive pulmonary disease)   . Hypothyroidism   . Thrombocytopenia   . Hypokalemia   . Edema   . 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 Thyroidectomy   . Laparoscopic cholecystectomy   . Lithotripsy      for nephrolithiasis    . Vaginal hysterectomy      for menorrhagia  at age 76  . Breast cyst excision   . Rotator cuff repair   . Cesarean section   . Laminectomy     Medications: Prior to Admission medications   Medication Sig Start Date End Date Taking? Authorizing Provider  FLUoxetine (PROZAC) 20 MG capsule Take 20 mg by mouth daily.     Yes Historical Provider, MD  furosemide (LASIX) 80 MG tablet Take 80 mg by mouth daily.   Yes Historical Provider, MD  levothyroxine (SYNTHROID, LEVOTHROID) 137 MCG tablet Take 137 mcg by mouth daily.   Yes Historical Provider, MD  metoprolol succinate (TOPROL-XL) 25 MG 24 hr tablet Take 25 mg by mouth daily.   Yes Historical Provider, MD  morphine (MS CONTIN) 60 MG 12 hr tablet Take 120 mg by mouth 2 (two) times daily.   Yes Historical Provider, MD  morphine (MSIR) 15 MG tablet Take 15 mg by mouth every 4 (four) hours as needed. pain   Yes Historical Provider, MD  potassium chloride (KLOR-CON) 10 MEQ CR tablet Take 20 mEq by mouth daily. 2 tabs po qam and 1 po qhs   Yes Historical Provider, MD    Allergies:   Allergies  Allergen Reactions  . Latex     REACTION: itch  . Penicillins     REACTION: hives    Social History:  reports that she has been smoking.  She does not have any smokeless tobacco history on file. Her alcohol and drug histories not on file. married,  has a cane walker and wheelchair. Most use a wheelchair recently.  Family History: Family History  Problem Relation Age of Onset  . Emphysema Father   . Allergies Sister   . Asthma Sister   . Heart disease Father   . Cancer Mother     Physical Exam: Filed Vitals:   10/24/11 1929 10/24/11 2000 10/24/11 2015 10/24/11 2030  BP: 115/91 98/58  117/44  Pulse: 72 76 78   Temp:      TempSrc:      Resp: 16     SpO2: 95% 95% 98%     General:  Alert and oriented times three, well developed and nourished, no acute distress Eyes: PERRLA, pink conjunctiva, no scleral icterus ENT: Moist oral mucosa,  neck supple, no thyromegaly Lungs: clear to ascultation, no wheeze, no crackles, no use of accessory muscles Cardiovascular: regular rate and rhythm, no regurgitation, no gallops, 3/6 murmurs. carotid bruits, no JVD Abdomen: soft, positive BS, non-tender, very obese abdomen no organomegaly, not an acute abdomen GU: not examined Neuro: CN II - XII grossly intact, decreased sensation Musculoskeletal: strength ~3/5 bilateral lower extremities, no clubbing, cyanosis or chronic bilateral lower extremity edema, lymphedema Skin: no rash, no subcutaneous crepitation, no decubitus Psych: appropriate patient   Labs on Admission:   Basename 10/24/11 1645  NA 140  K 4.0  CL 103  CO2 32  GLUCOSE 103*  BUN 20  CREATININE 0.69  CALCIUM 8.8  MG --  PHOS --    Basename 10/24/11 1645  AST 31  ALT 19  ALKPHOS 128*  BILITOT 0.6  PROT 6.3  ALBUMIN 3.1*   No results found for this basename: LIPASE:2,AMYLASE:2 in the last 72 hours  Basename 10/24/11 1645  WBC 6.2  NEUTROABS 4.3  HGB 12.0  HCT 37.0  MCV 89.4  PLT 93*   No results found for this basename: CKTOTAL:3,CKMB:3,CKMBINDEX:3,TROPONINI:3 in the last 72 hours No components found with this basename: POCBNP:3 No results found for this basename: DDIMER:2 in the last 72 hours No results found for this basename: HGBA1C:2 in the last 72 hours No results found for this basename: CHOL:2,HDL:2,LDLCALC:2,TRIG:2,CHOLHDL:2,LDLDIRECT:2 in the last 72 hours No results found for this basename: TSH,T4TOTAL,FREET3,T3FREE,THYROIDAB in the last 72 hours No results found for this basename: VITAMINB12:2,FOLATE:2,FERRITIN:2,TIBC:2,IRON:2,RETICCTPCT:2 in the last 72 hours  Micro Results: No results found for this or any previous visit (from the past 240 hour(s)).   Radiological Exams on Admission: Mr Lumbar Spine Wo Contrast  10/24/2011  *RADIOLOGY REPORT*  Clinical Data: Lower extremity weakness.  Severe low back pain.  MRI LUMBAR SPINE WITHOUT  CONTRAST  Technique:  Multiplanar and multiecho pulse sequences of the lumbar spine were obtained without intravenous contrast.  Comparison: MRI of the lumbar spine 05/19/2009.  Findings: Normal signal is present in the conus medullaris which terminates at L1-2, within normal limits.  Rightward curvature of the lumbar spine is centered at L3 without significant interval change.  Chronic end plate marrow changes are present at T11-12, L1- 2, L4-5, and L5-S1.  Vertebral body heights and AP alignment are maintained.  Limited imaging of the abdomen is unremarkable.  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  L1-2:  Mild leftward disc bulging is present.  Moderate facet hypertrophy is present bilaterally.  No significant stenosis is present.  L2-3:  A leftward disc herniation is present.  Moderate facet hypertrophy is evident.  The left lateral recess narrowing has progressed.  There is mild encroachment into the foramina bilaterally  without significant stenosis.  L3-4:  A broad-based disc herniation is present.  Moderate facet hypertrophy is present.  There is slight progression of mild central canal stenosis with left greater than right lateral recess narrowing.  The disc extends into the inferior recess of both neural foramina without significant stenosis.  L4-5:  A broad-based disc herniation is present.  Advanced facet hypertrophy is present.  There is progression of severe central canal stenosis.  Mild foraminal narrowing is present bilaterally.  L5-S1:  A broad-based disc herniation is present.  Moderate facet hypertrophy is evident.  This results in moderate right and mild left foraminal narrowing, slightly worse than on the prior exam.  IMPRESSION:  1.  Progression of severe central canal stenosis at L4-5. 2.  Progression of moderate right and mild left foraminal stenosis at L5-S1. 3.  Mild foraminal narrowing bilaterally at L4-5 is stable. 4.  Progression of mild central canal  narrowing at L3-4 with left greater than right lateral recess narrowing. 5.  Progression of left lateral recess narrowing at L2-3.  6.  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   Original Report Authenticated By: Jamesetta Orleans. MATTERN, M.D.    Dg Knee Complete 4 Views Left  10/24/2011  *RADIOLOGY REPORT*  Clinical Data: History of pain and swelling involving the anterior and posterior aspects of the left knee with no known injury.  LEFT KNEE - COMPLETE 4+ VIEW  Comparison: None.  Findings: There is obesity.  There is osteopenic appearance of bones.  There is narrowing of the medial joint space.  There is minimal patellar spurring.  There is slight fullness in the suprapatellar region on lateral examination which may reflect a small amount of joint effusion.  No chondrocalcinosis or opaque loose body is evident.  No fracture or dislocation is evident.  IMPRESSION: Obesity.  Osteopenic appearance of bones.  Narrowing of medial joint space.  Patellar spurring.  Question small joint effusion.   Original Report Authenticated By: Crawford Givens, M.D.     Assessment/Plan Present on Admission:  .Spinal stenosis of lumbar region Inability to Ambulate Admit to the MedSurg at [facility] Dr. Newell Coral aware  Pain medication and physical therapy consulted  .HYPOTHYROIDISM .PVD (peripheral vascular disease) .HYPOKALEMIA .DEPRESSION .COPD Stable resume home medications  .AORTIC STENOSIS Will order 2-D echo for the a.m.   Full code DVT prophylaxis  Annsley Akkerman 10/25/2011, 12:24 AM

## 2011-10-26 DIAGNOSIS — E876 Hypokalemia: Secondary | ICD-10-CM

## 2011-10-26 DIAGNOSIS — R29898 Other symptoms and signs involving the musculoskeletal system: Secondary | ICD-10-CM

## 2011-10-26 HISTORY — DX: Other symptoms and signs involving the musculoskeletal system: R29.898

## 2011-10-26 NOTE — Consult Note (Signed)
Reason for Consult: Lumbar stenosis Referring Physician: Dr. Ted Mcalpine  [name] is an 66 y.o. female.  HPI: Patient is a 66 year old white female who seen in neurosurgical consultation at the request of Dr. Ardyth Harps regarding lumbar stenosis. The patient explains that she has had a long history of problems with her back, for at least 13 years. She did see Dr. Marikay Alar about 2-1/2 years ago after having undergone MRI in March of 2011.  However the patient's been having increased difficulties recently. She is become increasingly immobilized, and with that increasingly deconditioned. She feels that she can't walk because her lower extremities are weak. She describes severe pain in the lower Charlie's, presents whether she is laying, sitting, standing, or walking. But she does say that the severe pain that runs down to the lower Charlie's is worse with walking and standing. She also describes numbness and tingling in her feet.  Patient reports that she was told by Dr. Yetta Barre on the day of admission to go to the emergency room for evaluation. She went to the [facility] emergency room. An MRI of the lumbar spine was done which revealed progression of degenerative changes at, particularly at the L4-5 level, with the interval development of marked multifactorial lumbar stenosis at the L4-5 level (MRI in March 2011 showed mild stenosis at the L4-5 level at that time).. The patient was admitted to the triad hospitalist service at [facility]. Neurosurgical consultation was requested.  Patient reports a history of valvular heart disease, hypertension, CHF, and a long history of smoking, having quit smoking 3 months ago, but using an E. cigarette.  Past Medical History:  Past Medical History  Diagnosis Date  . Carotid bruit     40-59% bilateral ICA 11/2007  . Aortic stenosis     moderate echo 01/2009  low risk myovue 11/2007 brest attenuation EF  63%  . Venous insufficiency   .  Arthritis   . Depression   . COPD (chronic obstructive pulmonary disease)   . Hypothyroidism   . Thrombocytopenia   . Hypokalemia   . Edema   . 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 Thyroidectomy   . Laparoscopic cholecystectomy   . Lithotripsy      for nephrolithiasis  . Vaginal hysterectomy      for menorrhagia  at age 79  . Breast cyst excision   . Rotator cuff repair   . Cesarean section   . Laminectomy     Family History:  Family History  Problem Relation Age of Onset  . Emphysema Father   . Allergies Sister   . Asthma Sister   . Heart disease Father   . Cancer Mother     Social History:  reports that she has been smoking.  She does not have any smokeless tobacco history on file. Her alcohol and drug histories not on file.  Allergies:  Allergies  Allergen Reactions  . Latex     REACTION: itch  . Penicillins     REACTION: hives    Medications: I have reviewed the patient's current medications.  Review of systems: Notable for those difficulties described in her history of present illness and past medical history, but otherwise unremarkable.  Results for orders placed during the hospital encounter of 10/24/11 (from the past 48 hour(s))  CBC WITH DIFFERENTIAL  Status: Abnormal   Collection Time   10/24/11  4:45 PM      Component Value Range Comment   WBC 6.2  4.0 - 10.5 K/uL    RBC 4.14  3.87 - 5.11 MIL/uL    Hemoglobin 12.0  12.0 - 15.0 g/dL    HCT 16.1  09.6 - 04.5 %    MCV 89.4  78.0 - 100.0 fL    MCH 29.0  26.0 - 34.0 pg    MCHC 32.4  30.0 - 36.0 g/dL    RDW 40.9  81.1 - 91.4 %    Platelets 93 (*) 150 - 400 K/uL    Neutrophils Relative 70  43 - 77 %    Lymphocytes Relative 19  12 - 46 %    Monocytes Relative 9  3 - 12 %    Eosinophils Relative 2  0 - 5 %    Basophils Relative 0  0 - 1 %    Neutro Abs  4.3  1.7 - 7.7 K/uL    Lymphs Abs 1.2  0.7 - 4.0 K/uL    Monocytes Absolute 0.6  0.1 - 1.0 K/uL    Eosinophils Absolute 0.1  0.0 - 0.7 K/uL    Basophils Absolute 0.0  0.0 - 0.1 K/uL    Smear Review PLATELET COUNT CONFIRMED BY SMEAR     COMPREHENSIVE METABOLIC PANEL     Status: Abnormal   Collection Time   10/24/11  4:45 PM      Component Value Range Comment   Sodium 140  135 - 145 mEq/L    Potassium 4.0  3.5 - 5.1 mEq/L    Chloride 103  96 - 112 mEq/L    CO2 32  19 - 32 mEq/L    Glucose, Bld 103 (*) 70 - 99 mg/dL    BUN 20  6 - 23 mg/dL    Creatinine, Ser 7.82  0.50 - 1.10 mg/dL    Calcium 8.8  8.4 - 95.6 mg/dL    Total Protein 6.3  6.0 - 8.3 g/dL    Albumin 3.1 (*) 3.5 - 5.2 g/dL    AST 31  0 - 37 U/L    ALT 19  0 - 35 U/L    Alkaline Phosphatase 128 (*) 39 - 117 U/L    Total Bilirubin 0.6  0.3 - 1.2 mg/dL    GFR calc non Af Amer 89 (*) >90 mL/min    GFR calc Af Amer >90  >90 mL/min   CBC     Status: Abnormal   Collection Time   10/25/11  5:00 AM      Component Value Range Comment   WBC 6.0  4.0 - 10.5 K/uL    RBC 4.14  3.87 - 5.11 MIL/uL    Hemoglobin 12.0  12.0 - 15.0 g/dL    HCT 21.3  08.6 - 57.8 %    MCV 90.3  78.0 - 100.0 fL    MCH 29.0  26.0 - 34.0 pg    MCHC 32.1  30.0 - 36.0 g/dL    RDW 46.9  62.9 - 52.8 %    Platelets 90 (*) 150 - 400 K/uL   BASIC METABOLIC PANEL     Status: Abnormal   Collection Time   10/25/11  5:00 AM      Component Value Range Comment   Sodium 140  135 - 145 mEq/L    Potassium 4.5  3.5 - 5.1 mEq/L    Chloride  104  96 - 112 mEq/L    CO2 29  19 - 32 mEq/L    Glucose, Bld 117 (*) 70 - 99 mg/dL    BUN 17  6 - 23 mg/dL    Creatinine, Ser 1.61  0.50 - 1.10 mg/dL    Calcium 8.8  8.4 - 09.6 mg/dL    GFR calc non Af Amer 89 (*) >90 mL/min    GFR calc Af Amer >90  >90 mL/min   PROTIME-INR     Status: Normal   Collection Time   10/25/11  5:00 AM      Component Value Range Comment   Prothrombin Time 14.2  11.6 - 15.2 seconds    INR 1.08  0.00 -  1.49     Mr Lumbar Spine Wo Contrast  10/24/2011  *RADIOLOGY REPORT*  Clinical Data: Lower extremity weakness.  Severe low back pain.  MRI LUMBAR SPINE WITHOUT CONTRAST  Technique:  Multiplanar and multiecho pulse sequences of the lumbar spine were obtained without intravenous contrast.  Comparison: MRI of the lumbar spine 05/19/2009.  Findings: Normal signal is present in the conus medullaris which terminates at L1-2, within normal limits.  Rightward curvature of the lumbar spine is centered at L3 without significant interval change.  Chronic end plate marrow changes are present at T11-12, L1- 2, L4-5, and L5-S1.  Vertebral body heights and AP alignment are maintained.  Limited imaging of the abdomen is unremarkable.  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  L1-2:  Mild leftward disc bulging is present.  Moderate facet hypertrophy is present bilaterally.  No significant stenosis is present.  L2-3:  A leftward disc herniation is present.  Moderate facet hypertrophy is evident.  The left lateral recess narrowing has progressed.  There is mild encroachment into the foramina bilaterally without significant stenosis.  L3-4:  A broad-based disc herniation is present.  Moderate facet hypertrophy is present.  There is slight progression of mild central canal stenosis with left greater than right lateral recess narrowing.  The disc extends into the inferior recess of both neural foramina without significant stenosis.  L4-5:  A broad-based disc herniation is present.  Advanced facet hypertrophy is present.  There is progression of severe central canal stenosis.  Mild foraminal narrowing is present bilaterally.  L5-S1:  A broad-based disc herniation is present.  Moderate facet hypertrophy is evident.  This results in moderate right and mild left foraminal narrowing, slightly worse than on the prior exam.  IMPRESSION:  1.  Progression of severe central canal stenosis at L4-5. 2.  Progression of  moderate right and mild left foraminal stenosis at L5-S1. 3.  Mild foraminal narrowing bilaterally at L4-5 is stable. 4.  Progression of mild central canal narrowing at L3-4 with left greater than right lateral recess narrowing. 5.  Progression of left lateral recess narrowing at L2-3.  6.  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   Original Report Authenticated By: Jamesetta Orleans. MATTERN, M.D.    Dg Knee Complete 4 Views Left  10/24/2011  *RADIOLOGY REPORT*  Clinical Data: History of pain and swelling involving the anterior and posterior aspects of the left knee with no known injury.  LEFT KNEE - COMPLETE 4+ VIEW  Comparison: None.  Findings: There is obesity.  There is osteopenic appearance of bones.  There is narrowing of the medial joint space.  There is minimal patellar spurring.  There is slight fullness in the suprapatellar region on lateral examination which  may reflect a small amount of joint effusion.  No chondrocalcinosis or opaque loose body is evident.  No fracture or dislocation is evident.  IMPRESSION: Obesity.  Osteopenic appearance of bones.  Narrowing of medial joint space.  Patellar spurring.  Question small joint effusion.   Original Report Authenticated By: Crawford Givens, M.D.     Physical examination:  Patient is a morbidly obese white female, sitting up in a chair, in no acute distress. Extremity examination shows changes consistent with severe chronic edema in the distal lower extremities. Blood pressure 136/55, pulse 69, temperature 98.1 F (36.7 C), temperature source Oral, resp. rate 20, height 5' (1.524 m), weight 120.974 kg (266 lb 11.2 oz), SpO2 96.00%. Mental status: Patient is awake, alert, oriented. Following commands, speech fluent. Cranial nerves: EOMs are intact, facial movement symmetrical, hearing present. Motor examination: Patient is 5 over 5 strength in the lower extremities including the iliopsoas, quadriceps, dorsi flexor, extensor  hallicus longus, and plantar flexor bilaterally. Sensory examination: intact to pinprick in the thighs, legs, and feet bilaterally. Reflex examination: Quadriceps are 1-2 bilaterally, gastrocnemius are absent bilaterally, toes are downgoing bilaterally.  Assessment/Plan:  Patient presents with complaints of discomfort and pain in the lower extremities, which has led to immobility, deconditioning, and weight gain. The causes of her discomfort and pain in the lower extremities are multifactorial. A portion of the discomfort and pain may well be coming from neurogenic claudication secondary to marked multifactorial lumbar stenosis at the L4-5 level, which has progressed when comparing her March 2011 lumbar MRI to her lumbar MRI 2 days ago. 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  Examination shows intact strength and sensation (to pinprick) in the lower extremities; however despite intact strength she has significant limitations in mobility due to 1) deconditioning, 2) her morbid obesity, 3) DJD and 4) limitations that the severe chronic edema in the lower extremities causes.  I spoke with the patient and her husband at length about my assessment and the options that I see regarding her lumbar stenosis. However I frankly explained to them that surgical intervention for lumbar stenosis entails 1) significantly greater risk for her because of her multiple medical comorbidities including her morbid obesity and 2) potential benefit limited to relief of her neurogenic claudication, but no potential benefit regarding her deconditioning, DJD, and chronic lower extremity edema.  As regards her lumbar stenosis options for  further treatment and care range from living with the symptoms as they are, to epidural steroid injections(which the patient says that she's had before and have not been effective for her), to surgical decompression via a L4 and L5 decompressive lumbar laminectomy. I discussed the nature of her condition, and the nature the surgical procedure, I've drawn for the patient and her husband and illustration of her condition and what the laminectomy would do for decompression. We discussed risks of surgery including risks of infection, bleeding, nerve root dysfunction with pain, weakness, numbness, or paresthesias, the risk of dural tear and CSF leakage and possibly for further surgery, and anesthetic risks of myocardial function, stroke, pneumonia, and death. I've explained the patient and her husband that all these risks are significantly increased because of her multiple significant medical comorbidities.  She does want to consider  proceeding with surgical decompression of her lumbar stenosis, because she feels that she needs to begin to address the multiple problems that are contributing to her pain and immobility, but she also understands and accepts the limited potential for the surgery to help with her overall constellation of her difficulties.  I did also speak with Dr. Ted Mcalpine from [facility] and explained my assessment and recommendations. I also explained that the patient, her husband, and I discussed the need for preoperative clearance for surgery. The patient and her husband mentioning that she is followed by Dr. Marcelyn Bruins [facility] pulmonary) his and also by Dr. Charlton Haws [facility] cardiology), and that they would want clearance from them for her to undergo general anesthesia and surgery. Dr. Ardyth Harps will make the arrangements for those consultations.  I agree with Dr. Ardyth Harps has initiated PT and OT. If in the end the patient is to undergo surgery, I would anticipate her benefiting from CIR  postoperatively.    Hewitt Shorts, MD 10/26/2011, 11:59 AM

## 2011-10-26 NOTE — Progress Notes (Signed)
[facility]             Progress Note   Subjective: No true complaints other than continued bilateral LE weakness and difficulty ambulating.  Objective: Vital signs in last 24 hours: Temp:  [97.9 F (36.6 C)-98.8 F (37.1 C)] 98.1 F (36.7 C) (09/01 0500) Pulse Rate:  [69-88] 69  (09/01 0500) Resp:  [16-20] 20  (09/01 0500) BP: (130-166)/(39-70) 136/55 mmHg (09/01 0500) SpO2:  [96 %-97 %] 96 % (09/01 0500) Weight change:  Last BM Date: 10/25/11  Intake/Output from previous day: 08/31 0701 - 09/01 0700 In: 840 [P.O.:840] Out: -  Total I/O In: 240 [P.O.:240] Out: -    Physical Exam: General: Alert, awake, oriented x3, in no acute distress, obese. HEENT: No bruits, no goiter. Heart: Regular rate and rhythm, +SEM. Lungs: Clear to auscultation bilaterally. Abdomen: Soft, nontender, nondistended, positive bowel sounds. Extremities: 1+ edema bilaterally Neuro: Grossly intact, nonfocal.    Lab Results: Basic Metabolic Panel:  Basename 10/25/11 0500 10/24/11 1645  NA 140 140  K 4.5 4.0  CL 104 103  CO2 29 32  GLUCOSE 117* 103*  BUN 17 20  CREATININE 0.70 0.69  CALCIUM 8.8 8.8  MG -- --  PHOS -- --   Liver Function Tests:  [facility] 10/24/11 1645  AST 31  ALT 19  ALKPHOS 128*  BILITOT 0.6  PROT 6.3  ALBUMIN 3.1*   CBC:  Basename 10/25/11 0500 10/24/11 1645  WBC 6.0 6.2  NEUTROABS -- 4.3  HGB 12.0 12.0  HCT 37.4 37.0  MCV 90.3 89.4  PLT 90* 93*   Coagulation:  Basename 10/25/11 0500  LABPROT 14.2  INR 1.08   Urine Drug Screen: Drugs of Abuse     Component Value Date/Time   LABOPIA NEGATIVE 01/15/2009 0117   COCAINSCRNUR NEGATIVE 01/15/2009 0117   LABBENZ  Value: POSITIVE (NOTE) Sent for confirmatory testing Result repeated and verified.* 01/15/2009 0117   AMPHETMU NEGATIVE 01/15/2009 0117      Studies/Results: Mr Lumbar Spine Wo Contrast  10/24/2011  *RADIOLOGY REPORT*  Clinical Data: Lower extremity weakness.  Severe low  back pain.  MRI LUMBAR SPINE WITHOUT CONTRAST  Technique:  Multiplanar and multiecho pulse sequences of the lumbar spine were obtained without intravenous contrast.  Comparison: MRI of the lumbar spine 05/19/2009.  Findings: Normal signal is present in the conus medullaris which terminates at L1-2, within normal limits.  Rightward curvature of the lumbar spine is centered at L3 without significant interval change.  Chronic end plate marrow changes are present at T11-12, L1- 2, L4-5, and L5-S1.  Vertebral body heights and AP alignment are maintained.  Limited imaging of the abdomen is unremarkable.  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  L1-2:  Mild leftward disc bulging is present.  Moderate facet hypertrophy is present bilaterally.  No significant stenosis is present.  L2-3:  A leftward disc herniation is present.  Moderate facet hypertrophy is evident.  The left lateral recess narrowing has progressed.  There is mild encroachment into the foramina bilaterally without significant stenosis.  L3-4:  A broad-based disc herniation is present.  Moderate facet hypertrophy is present.  There is slight progression of mild central canal stenosis with left greater than right lateral recess narrowing.  The disc extends into the inferior recess of both neural foramina without significant stenosis.  L4-5:  A broad-based disc herniation is present.  Advanced facet hypertrophy is present.  There is progression of severe central canal stenosis.  Mild foraminal narrowing is present bilaterally.  L5-S1:  A broad-based disc herniation is present.  Moderate facet hypertrophy is evident.  This results in moderate right and mild left foraminal narrowing, slightly worse than on the prior exam.  IMPRESSION:  1.  Progression of severe central canal stenosis at L4-5. 2.  Progression of moderate right and mild left foraminal stenosis at L5-S1. 3.  Mild foraminal narrowing bilaterally at L4-5 is stable. 4.   Progression of mild central canal narrowing at L3-4 with left greater than right lateral recess narrowing. 5.  Progression of left lateral recess narrowing at L2-3.  6.  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   Original Report Authenticated By: Jamesetta Orleans. MATTERN, M.D.    Dg Knee Complete 4 Views Left  10/24/2011  *RADIOLOGY REPORT*  Clinical Data: History of pain and swelling involving the anterior and posterior aspects of the left knee with no known injury.  LEFT KNEE - COMPLETE 4+ VIEW  Comparison: None.  Findings: There is obesity.  There is osteopenic appearance of bones.  There is narrowing of the medial joint space.  There is minimal patellar spurring.  There is slight fullness in the suprapatellar region on lateral examination which may reflect a small amount of joint effusion.  No chondrocalcinosis or opaque loose body is evident.  No fracture or dislocation is evident.  IMPRESSION: Obesity.  Osteopenic appearance of bones.  Narrowing of medial joint space.  Patellar spurring.  Question small joint effusion.   Original Report Authenticated By: Crawford Givens, M.D.     Medications: Scheduled Meds:    . docusate sodium  100 mg Oral BID  . enoxaparin (LOVENOX) injection  60 mg Subcutaneous Q24H  . FLUoxetine  20 mg Oral Daily  . furosemide  80 mg Oral Daily  . levothyroxine  137 mcg Oral Q0600  . metoprolol succinate  25 mg Oral Daily  . morphine  120 mg Oral Q12H  . potassium chloride  20 mEq Oral Daily  . DISCONTD: morphine  120 mg Oral BID   Continuous Infusions:  PRN Meds:.acetaminophen, acetaminophen, alum & mag hydroxide-simeth, morphine, morphine injection, ondansetron (ZOFRAN) IV, ondansetron, senna-docusate  Assessment/Plan:  Principal Problem:  *Lower extremity weakness Active Problems:  HYPOTHYROIDISM  HYPOKALEMIA  DEPRESSION  AORTIC STENOSIS  COPD  PVD (peripheral vascular disease)  Spinal stenosis of lumbar region   #1 Bilateral  LE weakness: likely multifactorial, but the main concern is whether MRI findings are playing a role. Still awaiting neurosurgical input. Have placed a call to Dr. Venetia Maxon today. PT is recommended a CIR consult. Will place.  #2 Aortic Stenosis: If she were to need surgery will need cardiology clearance given her severe aortic stenosis.  Rest of chronic issues are currently stable.   LOS: 2 days   [facility] Pager: [phone] 10/26/2011, 10:31 AM

## 2011-10-27 ENCOUNTER — Encounter (HOSPITAL_COMMUNITY): Payer: Self-pay | Admitting: Physician Assistant

## 2011-10-27 DIAGNOSIS — I5032 Chronic diastolic (congestive) heart failure: Secondary | ICD-10-CM

## 2011-10-27 DIAGNOSIS — M129 Arthropathy, unspecified: Secondary | ICD-10-CM

## 2011-10-27 DIAGNOSIS — I509 Heart failure, unspecified: Secondary | ICD-10-CM

## 2011-10-27 DIAGNOSIS — I739 Peripheral vascular disease, unspecified: Secondary | ICD-10-CM

## 2011-10-27 DIAGNOSIS — D696 Thrombocytopenia, unspecified: Secondary | ICD-10-CM

## 2011-10-27 DIAGNOSIS — M48061 Spinal stenosis, lumbar region without neurogenic claudication: Principal | ICD-10-CM

## 2011-10-27 HISTORY — DX: Morbid (severe) obesity due to excess calories: E66.01

## 2011-10-27 HISTORY — DX: Chronic diastolic (congestive) heart failure: I50.32

## 2011-10-27 LAB — BASIC METABOLIC PANEL
CO2: 35 mEq/L — ABNORMAL HIGH (ref 19–32)
Calcium: 9.1 mg/dL (ref 8.4–10.5)
Chloride: 98 mEq/L (ref 96–112)
Creatinine, Ser: 0.69 mg/dL (ref 0.50–1.10)
GFR calc Af Amer: >90 mL/min (ref >90)
Sodium: 139 mEq/L (ref 135–145)

## 2011-10-27 LAB — CBC
HCT: 40 % (ref 36.0–46.0)
MCV: 89.7 fL (ref 78.0–100.0)
Platelets: 107 10*3/uL — ABNORMAL LOW (ref 150–400)
RBC: 4.46 MIL/uL (ref 3.87–5.11)
RDW: 14.4 % (ref 11.5–15.5)
WBC: 6 10*3/uL (ref 4.0–10.5)

## 2011-10-27 MED ORDER — SENNOSIDES-DOCUSATE SODIUM 8.6-50 MG PO TABS
3.0000 | ORAL_TABLET | Freq: Every day | ORAL | Status: DC
  Administered 2011-10-27 – 2011-10-29 (×3): 3 via ORAL
  Filled 2011-10-27 (×4): qty 3

## 2011-10-27 MED ORDER — NICOTINE 14 MG/24HR TD PT24
14.0000 mg | MEDICATED_PATCH | Freq: Every day | TRANSDERMAL | Status: DC
  Administered 2011-10-27 – 2011-10-30 (×4): 14 mg via TRANSDERMAL
  Filled 2011-10-27 (×4): qty 1

## 2011-10-27 NOTE — Progress Notes (Signed)
Occupational Therapy Evaluation Patient Details Name: [name] MRN: [id number] DOB: May 19, 1945 Today's Date: 10/27/2011 Time: 4098-1191 OT Time Calculation (min): 20 min  OT Assessment / Plan / Recommendation Clinical Impression  66 yo female with back pain and LE weakness that has been progressing for the last 2 weeks.     OT Assessment  Patient needs continued OT Services    Follow Up Recommendations  No OT follow up    Barriers to Discharge      Equipment Recommendations  Tub/shower bench;Hospital bed    Recommendations for Other Services    Frequency  Min 2X/week    Precautions / Restrictions Precautions Precautions: Fall Restrictions Weight Bearing Restrictions: No   Pertinent Vitals/Pain     ADL  Eating/Feeding: Simulated;Set up Where Assessed - Eating/Feeding: Other (comment) (Sink level ) Grooming: Performed;Wash/dry hands;Teeth care;Denture care;Supervision/safety Where Assessed - Grooming: Supported standing Lower Body Dressing: Performed;Supervision/safety Where Assessed - Lower Body Dressing: Supported sitting Toilet Transfer: Performed;Min guard Statistician Method: Sit to Barista: Regular height toilet;Grab bars Toileting - Clothing Manipulation and Hygiene: Performed;Minimal assistance Where Assessed - Engineer, mining and Hygiene: Sit on 3-in-1 or toilet;Standing Equipment Used: Gait belt;Rolling walker Transfers/Ambulation Related to ADLs: decreased gait velocity, increases risk for fall  ADL Comments: Required min assistance to complete doffing her paints to toilet. Pt. states at times she is not quick enough to transfer to the toilet when using the restroom.     OT Diagnosis: Generalized weakness  OT Problem List: Decreased activity tolerance;Decreased safety awareness;Obesity;Pain;Decreased strength OT Treatment Interventions: Self-care/ADL training;DME and/or AE instruction;Patient/family education    OT Goals Acute Rehab OT Goals OT Goal Formulation: With patient/family Time For Goal Achievement: 11/10/11 Potential to Achieve Goals: Good ADL Goals Pt Will Perform Grooming: with modified independence;Standing at sink;Supported ADL Goal: Grooming - Progress: Goal set today Pt Will Perform Upper Body Bathing: Standing at sink;with set-up ADL Goal: Upper Body Bathing - Progress: Goal set today Pt Will Perform Lower Body Bathing: with min assist;with adaptive equipment;Sit to stand in shower ADL Goal: Lower Body Bathing - Progress: Goal set today Pt Will Perform Upper Body Dressing: with set-up;Sitting, chair ADL Goal: Upper Body Dressing - Progress: Goal set today Pt Will Perform Lower Body Dressing: Sit to stand from chair;Supported;with modified independence ADL Goal: Lower Body Dressing - Progress: Goal set today Pt Will Transfer to Toilet: with modified independence;Regular height toilet;Grab bars ADL Goal: Toilet Transfer - Progress: Goal set today Pt Will Perform Toileting - Clothing Manipulation: with modified independence;Standing;Sitting on 3-in-1 or toilet ADL Goal: Toileting - Clothing Manipulation - Progress: Goal set today Pt Will Perform Toileting - Hygiene: Independently ADL Goal: Toileting - Hygiene - Progress: Goal set today  Visit Information  Last OT Received On: 10/27/11 Assistance Needed: +1 PT/OT Co-Evaluation/Treatment: Yes    Subjective Data  Subjective: I am doing okay, my back hurts a little Patient Stated Goal: To go home    Prior Functioning  Vision/Perception  Home Living Lives With: Spouse Available Help at Discharge: Available 24 hours/day Type of Home: House Home Access: Stairs to enter [employer] of Steps: 3 Home Layout: One level Bathroom Shower/Tub: Engineer, manufacturing systems: Standard Home Adaptive Equipment: Bedside commode/3-in-1;Walker - four wheeled;Wheelchair - manual Additional Comments: pt's husband has been  using a makeshift ramp to get pt ina nd out of house in w/c but is working on having a real ramp built.   Prior Function Level of Independence: Needs assistance  Needs Assistance: Bathing;Dressing;Grooming;Toileting;Meal Prep;Light Housekeeping;Gait;Transfers Meal Prep: Total Light Housekeeping: Total Gait Assistance: assist with RW and most recently, push in w/c Able to Take Stairs?: No Driving: No Vocation: Retired Dominant Hand: Right   Perception Perception: Within Systems developer  Cognition  Overall Cognitive Status: Appears within functional limits for tasks assessed/performed Arousal/Alertness: Awake/alert Orientation Level: Appears intact for tasks assessed Behavior During Session: [facility] for tasks performed Current Attention Level: Other (comment) (Normal)    Extremity/Trunk Assessment Right Upper Extremity Assessment RUE ROM/Strength/Tone: Within functional levels Left Upper Extremity Assessment LUE ROM/Strength/Tone: Within functional levels Trunk Assessment Trunk Assessment: Kyphotic   Mobility  Shoulder Instructions  Bed Mobility Bed Mobility: Supine to Sit;Sitting - Scoot to Edge of Bed;Sit to Sidelying Left Supine to Sit: 4: Min assist Sitting - Scoot to Edge of Bed: 5: Supervision Sit to Supine: 3: Mod assist Sit to Sidelying Left: 1: +2 Total assist Sit to Sidelying Left: Patient Percentage: 30% Details for Bed Mobility Assistance: Max cueing for log roll technique back to bed.  pt unable to lift LEs at all to return to bed.   Transfers Transfers: Sit to Stand;Stand to Sit Sit to Stand: 4: Min assist;With upper extremity assist;From bed;3: Mod assist;From chair/3-in-1 Stand to Sit: 4: Min assist;With upper extremity assist;To chair/3-in-1;To bed Details for Transfer Assistance: pt requiring increased A for transfers from toilet.  cues for UE use, safe technique, sequencing.              Balance Balance Balance Assessed: No Dynamic Standing  Balance Dynamic Standing - Balance Support: Bilateral upper extremity supported;During functional activity Dynamic Standing - Level of Assistance: 5: Stand by assistance   End of Session OT - End of Session Equipment Utilized During Treatment: Gait belt Activity Tolerance: Patient tolerated treatment well Patient left: in bed;with call bell/phone within reach;with family/visitor present  GO     Cleora Fleet 10/27/2011, 3:32 PM I agree with the following treatment note after reviewing documentation.   Harrel Carina [place]   OTR/L Pager: [phone] Office: [phone] .

## 2011-10-27 NOTE — Progress Notes (Signed)
[facility]             Progress Note   Subjective: No true complaints other than continued bilateral LE weakness and difficulty ambulating.  Objective: Vital signs in last 24 hours: Temp:  [97.3 F (36.3 C)-98.4 F (36.9 C)] 97.3 F (36.3 C) (09/02 0600) Pulse Rate:  [72-79] 79  (09/02 0932) Resp:  [18-20] 20  (09/02 0600) BP: (111-153)/(50-63) 111/63 mmHg (09/02 0932) SpO2:  [92 %-97 %] 97 % (09/02 0600) Weight change:  Last BM Date: 10/25/11  Intake/Output from previous day: 09/01 0701 - 09/02 0700 In: 960 [P.O.:960] Out: -      Physical Exam: General: Alert, awake, oriented x3, in no acute distress, obese. HEENT: No bruits, no goiter. Heart: Regular rate and rhythm, +SEM. Lungs: Clear to auscultation bilaterally. Abdomen: Soft, nontender, nondistended, positive bowel sounds. Extremities: 1+ edema bilaterally Neuro: Grossly intact, nonfocal.    Lab Results: Basic Metabolic Panel:  Basename 10/27/11 0558 10/25/11 0500  NA 139 140  K 3.8 4.5  CL 98 104  CO2 35* 29  GLUCOSE 126* 117*  BUN 15 17  CREATININE 0.69 0.70  CALCIUM 9.1 8.8  MG -- --  PHOS -- --   Liver Function Tests:  [facility] 10/24/11 1645  AST 31  ALT 19  ALKPHOS 128*  BILITOT 0.6  PROT 6.3  ALBUMIN 3.1*   CBC:  Basename 10/27/11 0558 10/25/11 0500 10/24/11 1645  WBC 6.0 6.0 --  NEUTROABS -- -- 4.3  HGB 12.6 12.0 --  HCT 40.0 37.4 --  MCV 89.7 90.3 --  PLT 107* 90* --   Coagulation:  Basename 10/25/11 0500  LABPROT 14.2  INR 1.08   Urine Drug Screen: Drugs of Abuse     Component Value Date/Time   LABOPIA NEGATIVE 01/15/2009 0117   COCAINSCRNUR NEGATIVE 01/15/2009 0117   LABBENZ  Value: POSITIVE (NOTE) Sent for confirmatory testing Result repeated and verified.* 01/15/2009 0117   AMPHETMU NEGATIVE 01/15/2009 0117      Studies/Results: No results found.  Medications: Scheduled Meds:    . docusate sodium  100 mg Oral BID  . enoxaparin (LOVENOX)  injection  60 mg Subcutaneous Q24H  . FLUoxetine  20 mg Oral Daily  . furosemide  80 mg Oral Daily  . levothyroxine  137 mcg Oral Q0600  . metoprolol succinate  25 mg Oral Daily  . morphine  120 mg Oral Q12H  . potassium chloride  20 mEq Oral Daily  . senna-docusate  3 tablet Oral QHS   Continuous Infusions:  PRN Meds:.acetaminophen, acetaminophen, alum & mag hydroxide-simeth, morphine, morphine injection, ondansetron (ZOFRAN) IV, ondansetron, DISCONTD: senna-docusate  Assessment/Plan:  Principal Problem:  *Lower extremity weakness Active Problems:  HYPOTHYROIDISM  HYPOKALEMIA  THROMBOCYTOPENIA  DEPRESSION  AORTIC STENOSIS  COPD  PVD (peripheral vascular disease)  Spinal stenosis of lumbar region  Chronic diastolic CHF (congestive heart failure)   #1 Bilateral LE weakness: likely multifactorial: spinal stenosis, obesity, mild PVD, arthritis of knees, chronic edema. Patient is willing to proceed with lumbar decompressive surgery with Dr. Newell Coral to see if this offers any improvement. Plan is to proceed with surgery once medically cleared.  #2 Aortic Stenosis/chronic diastolic CHF: Have asked cardiology to eval her for clearance. On ECHO done this admission she has grade 2 diastolic dysfunction as well as severe aortic stenosis with a valve area of 0.94.  #3 COPD: Dr. Newell Coral also requesting pulmonary clearance from Dr. Shelle Iron. Will consult in the am.  #4 Thrombocytopenia: This is  chronic. As far back as 2009 from records that I can see. She used to be followed by Dr. Gaylyn Rong at the cancer center but was discharged from his practice as her platelet count had been stable for a few years. It has ranged from about 90-110,000, which is her baseline. No further intervention at this point.   Rest of chronic issues are currently stable.   LOS: 3 days   HERNANDEZ ACOSTA,ESTELA [facility] Pager: [phone] 10/27/2011, 10:51 AM

## 2011-10-27 NOTE — Progress Notes (Signed)
Physical Therapy Treatment Patient Details Name: [name] MRN: [id number] DOB: May 09, 1945 Today's Date: 10/27/2011 Time: 3244-0102 PT Time Calculation (min): 28 min  PT Assessment / Plan / Recommendation Comments on Treatment Session  pt presents with Bil LE weakenss, back pain and per pt/family not a candidate for surgery.  pt notes very fatigued this pm secondary to multiple trips to bathroom secondary to lasix.  pt and husband very interested in CIR at D/C.      Follow Up Recommendations  Inpatient Rehab    Barriers to Discharge        Equipment Recommendations  Tub/shower bench;Hospital bed    Recommendations for Other Services Rehab consult  Frequency Min 3X/week   Plan Discharge plan remains appropriate;Frequency remains appropriate    Precautions / Restrictions Precautions Precautions: Fall Restrictions Weight Bearing Restrictions: No   Pertinent Vitals/Pain Indicates chronic back and LE pain.      Mobility  Bed Mobility Bed Mobility: Supine to Sit;Sitting - Scoot to Edge of Bed;Sit to Sidelying Left Supine to Sit: 4: Min assist Sitting - Scoot to Edge of Bed: 5: Supervision Sit to Supine: 3: Mod assist Sit to Sidelying Left: 1: +2 Total assist Sit to Sidelying Left: Patient Percentage: 30% Details for Bed Mobility Assistance: Max cueing for log roll technique back to bed.  pt unable to lift LEs at all to return to bed.   Transfers Transfers: Sit to Stand;Stand to Sit Sit to Stand: 4: Min assist;With upper extremity assist;From bed;3: Mod assist;From chair/3-in-1 Stand to Sit: 4: Min assist;With upper extremity assist;To chair/3-in-1;To bed Details for Transfer Assistance: pt requiring increased A for transfers from toilet.  cues for UE use, safe technique, sequencing.   Ambulation/Gait Ambulation/Gait Assistance: 4: Min guard Ambulation Distance (Feet): 25 Feet Assistive device: Rolling walker Ambulation/Gait Assistance Details: cues for upright posture,  positioning in RW, safety with turns.   Gait Pattern: Step-through pattern;Decreased stride length;Trunk flexed Stairs: No Wheelchair Mobility Wheelchair Mobility: No    Exercises     PT Diagnosis:    PT Problem List:   PT Treatment Interventions:     PT Goals Acute Rehab PT Goals Time For Goal Achievement: 11/08/11 Potential to Achieve Goals: Fair PT Goal: Supine/Side to Sit - Progress: Progressing toward goal PT Goal: Sit to Supine/Side - Progress: Progressing toward goal PT Goal: Sit to Stand - Progress: Progressing toward goal PT Goal: Stand to Sit - Progress: Progressing toward goal PT Goal: Ambulate - Progress: Progressing toward goal  Visit Information  Last PT Received On: 10/27/11 Assistance Needed: +1 PT/OT Co-Evaluation/Treatment: Yes    Subjective Data  Subjective: They decided I can't get the surgery, my heart won't take it.     Cognition  Overall Cognitive Status: Appears within functional limits for tasks assessed/performed Arousal/Alertness: Awake/alert Orientation Level: Appears intact for tasks assessed Behavior During Session: [facility] for tasks performed Current Attention Level: Focused    Balance  Balance Balance Assessed: No Dynamic Standing Balance Dynamic Standing - Balance Support: Bilateral upper extremity supported;During functional activity Dynamic Standing - Level of Assistance: 5: Stand by assistance  End of Session PT - End of Session Equipment Utilized During Treatment: Gait belt Activity Tolerance: Patient limited by pain Patient left: in bed;with call bell/phone within reach;with family/visitor present Nurse Communication: Mobility status   GP     Sunny Schlein, [place] [phone] 10/27/2011, 3:10 PM

## 2011-10-27 NOTE — Clinical Social Work Note (Signed)
Clinical Social Worker attempted to speak with patient regarding advance directive request. Patient was currently in the bathroom. CSW will return at a later time.  Verlon Au Darion Milewski MSW, LCSWA (Covering [place])

## 2011-10-27 NOTE — Progress Notes (Signed)
See OT note.    Mariha Sleeper, PT [phone]  

## 2011-10-27 NOTE — Progress Notes (Signed)
OT PT Cancellation Note  Treatment cancelled today due to stat EKG needed and then MD with patient. OT /PT to check back at time allows.Lucile Shutters Pager: [phone]  10/27/2011, 11:45 AM

## 2011-10-27 NOTE — Clinical Social Work Psychosocial (Signed)
     Clinical Social Work Department BRIEF PSYCHOSOCIAL ASSESSMENT 10/27/2011  Patient:  [name], [name]     Account Number:  [id number]     Admit date:  10/24/2011  Clinical Social Worker:  Hulan Fray  Date/Time:  10/27/2011 11:57 AM  Referred by:  RN  Date Referred:  10/25/2011 Referred for  Advanced Directives   Other Referral:   Interview type:  Patient Other interview type:   husband- Harvey    PSYCHOSOCIAL DATA Living Status:  HUSBAND Admitted from facility:   Level of care:   Primary support name:  Lorella Nimrod Primary support relationship to patient:  SPOUSE Degree of support available:   supportive    CURRENT CONCERNS Current Concerns  Other - See comment   Other Concerns:   Advance Directive    SOCIAL WORK ASSESSMENT / PLAN Clinical Social Worker received referral for advance directive request. CSW introduced self and explained reason for visit. Patient's husband was at bedside. CSW provided advance directive packet with MOST form and explained each forms to both patient and her husband. CSW answered patient's questions. Patient stated that she will wait until her daughter is available to discuss the forms. CSW informed patient that documents can be notarized during hospital admission and places in the community that can notarize documents as well. CSW will sign off. Please re-consult if patient requests documents to be notarized during hospital admission.   Assessment/plan status:  No Further Intervention Required Other assessment/ plan:   Information/referral to community resources:   Engineer, production and MOST form    PATIENTS/FAMILYS RESPONSE TO PLAN OF CARE: Patient was agreeable to receiving advance directive packet. Patient and husband were appreciative of CSW's visit.

## 2011-10-27 NOTE — Progress Notes (Signed)
Filed Vitals:   10/26/11 0500 10/26/11 1400 10/26/11 2100 10/27/11 0600  BP: 136/55 152/50 128/60 153/55  Pulse: 69 78 72 78  Temp: 98.1 F (36.7 C) 98.4 F (36.9 C) 98.3 F (36.8 C) 97.3 F (36.3 C)  TempSrc: Oral Oral Oral   Resp: 20 20 18 20   Height:      Weight:      SpO2: 96% 92% 96% 97%    CBC  Basename 10/27/11 0558 10/25/11 0500  WBC 6.0 6.0  HGB 12.6 12.0  HCT 40.0 37.4  PLT 107* 90*   BMET  Basename 10/27/11 0558 10/25/11 0500  NA 139 140  K 3.8 4.5  CL 98 104  CO2 35* 29  GLUCOSE 126* 117*  BUN 15 17  CREATININE 0.69 0.70  CALCIUM 9.1 8.8    Patient sitting up on side of bed. Has been getting out of bed with assistance to the bedside commode. To continue PT and OT.  Plan: Awaiting cardiology and pulmonary clearance for general anesthesia. Have asked nursing staff to ambulate patient, at least every shift, with assistance.  Thrombocytopenia is a concern, its etiology is uncertain, will defer to Dr. Ardyth Harps to evaluate.   Hewitt Shorts, MD 10/27/2011, 8:52 AM

## 2011-10-27 NOTE — Progress Notes (Signed)
Thank you for consult on Ms. Goodridge.  Patient has had progressive lumbar stenosis with inability to walk for past to week. This has been exacerbated by peripheral edema, morbid obesity and neurogenic claudication. Dr. Jule Ser recommends surgery pending cardiology and pulmonary clearance.   Recommend re-consult past surgery and therapy evaluation.

## 2011-10-27 NOTE — Consult Note (Signed)
CARDIOLOGY CONSULT NOTE  Patient ID: [name], MRN: [id number], DOB/AGE: 1946-01-29 66 y.o. Admit date: 10/24/2011   Date of Consult: 10/27/2011 Primary Physician: [name], MD Primary Cardiologist: Dr. Eden Emms  Chief Complaint: unable to walk Reason for Consult: preop clearance with history of AS  HPI: [name] is a 66 y/o F with hx of COPD with previous VDRF in 2010, chronic thrombocytopenia, PVD, and aortic stenosis who is followed by Dr. Eden Emms. She has not previously been felt to be an operative candidate due to comorbidities for her AS, previously noted as severe by notes referring to echo 03/2010. She presented to [facility] with complaints of numbness and pain in her bilateral lower extremities and in her back. She has been dragging her right leg and unable to stand up. She denied bowel/bladder incontinence. She has been seen by Dr Newell Coral with consideration for surgical laminectomy for progression of spinal stenosis that is tentatively planned for Wednesday. We are asked to see her in preop clearance. 2D echo 10/25/11 demonstrated EF 60-65%, grade 2 diastolic dysfunction, severe AS with mean gradient , mild MR. She denies any chest pain. She does have SOB infrequently on occasion that she describes as a panic attack. She does not exert herself at all so unsure if she has DOE. She chronically sleeps somewhat elevated which is unchanged. She has chronic LEE. No presyncope or syncope.  Past Medical History  Diagnosis Date  . Carotid bruit     0-39% bilateral ICA by dopplers 03/2010  . Aortic stenosis     moderate echo 01/2009, then severe 03/2010. low risk myovue 11/2007 brest attenuation EF  63%  . Venous insufficiency   . Arthritis   . Depression   . COPD (chronic obstructive pulmonary disease)   . Hypothyroidism   . Thrombocytopenia   . Hypokalemia   . Edema   . Respiratory failure     Ventilator-dependent respiratory failure secondary to hypercarbia,  cardiac arrest, presumed penumonia 12/2008  . Cardiac arrest     12/2010 in setting of respiratory failure with less than 15 minutes of CPR; initial rhythm was asystole  . PVD (peripheral vascular disease)     Eval by Dr. Excell Seltzer 2012: noninvasive immaging suggested significant aortoiliac disease, ABIs in mild range  - med rx given significant comorbidities  . H/O endoscopy     For ?ampullary mass - had EGD 07/2010 with no evidence of ampullary mass, some CBD dilitation       Most Recent Cardiac Studies: Per Dr. Fabio Bering office note 06/2010 (unable to locate paper copy of echo) "I reviewed her echo from 2/12 and she has severe AS. Not an operative candidate due to comorbidities. NO CAD on cath 12/11. Not TAVR candidate due to PVD" Per OV 03/2010:" Reviewed echo from today: Mean gradient 41 Pk 75 AVA by VTI 1.26 "  2D echo 10/25/11 Study Conclusions - Left ventricle: The cavity size was normal. Systolic function was normal. The estimated ejection fraction was in the range of 60% to 65%. Neill Jurewicz motion was normal; there were no regional Maziah Smola motion abnormalities. Features are consistent with a pseudonormal left ventricular filling pattern, with concomitant abnormal relaxation and increased filling pressure (grade 2 diastolic dysfunction). Doppler parameters are consistent with elevated ventricular end-diastolic filling pressure. - Aortic valve: There was severe stenosis. Mean gradient: 50mm Hg (S). Peak gradient: 91mm Hg (S). Valve area: 0.94cm^2(VTI). Valve area: 0.77cm^2 (Vmax). - Mitral valve: Leaflet separation was reduced. Mobility was restricted. Mild  regurgitation. Mean gradient: 6mm Hg (D). Valve area by pressure half-time: 2.12cm^2. Valve area by continuity equation (using LVOT flow): 1.45cm^2.  - Left atrium: The atrium was moderately dilated. Cardiac Cath 01/2009 CONCLUSION: 1. Normal coronary angiography. 2. Moderate aortic stenosis with a mean aortic valve gradient of 21     mmHg and a  calculated aortic valve area of 0.9 to 1.2 cm2. 3. Evidence of diastolic dysfunction and diastolic heart failure with     an LVEDP of 33 and a pulmonary wedge of 22 to 31. 4. Pulmonary vascular disease with elevated pulmonary vascular     resistance of 3.6 associated with known COPD.      Surgical History:  Past Surgical History  Procedure Date  . Thyroidectomy   . Laparoscopic cholecystectomy   . Lithotripsy      for nephrolithiasis  . Vaginal hysterectomy      for menorrhagia  at age 61  . Breast cyst excision   . Rotator cuff repair   . Cesarean section   . Laminectomy      Home Meds: Prior to Admission medications   Medication Sig Start Date End Date Taking? Authorizing Provider  FLUoxetine (PROZAC) 20 MG capsule Take 20 mg by mouth daily.     Yes Historical Provider, MD  furosemide (LASIX) 80 MG tablet Take 80 mg by mouth daily.   Yes Historical Provider, MD  levothyroxine (SYNTHROID, LEVOTHROID) 137 MCG tablet Take 137 mcg by mouth daily.   Yes Historical Provider, MD  metoprolol succinate (TOPROL-XL) 25 MG 24 hr tablet Take 25 mg by mouth daily.   Yes Historical Provider, MD  morphine (MS CONTIN) 60 MG 12 hr tablet Take 120 mg by mouth 2 (two) times daily.   Yes Historical Provider, MD  morphine (MSIR) 15 MG tablet Take 15 mg by mouth every 4 (four) hours as needed. pain   Yes Historical Provider, MD  potassium chloride (KLOR-CON) 10 MEQ CR tablet Take 20 mEq by mouth daily. 2 tabs po qam and 1 po qhs   Yes Historical Provider, MD    Inpatient Medications:     . docusate sodium  100 mg Oral BID  . enoxaparin (LOVENOX) injection  60 mg Subcutaneous Q24H  . FLUoxetine  20 mg Oral Daily  . furosemide  80 mg Oral Daily  . levothyroxine  137 mcg Oral Q0600  . metoprolol succinate  25 mg Oral Daily  . morphine  120 mg Oral Q12H  . potassium chloride  20 mEq Oral Daily  . senna-docusate  3 tablet Oral QHS    Allergies:  Allergies  Allergen Reactions  . Latex      REACTION: itch  . Penicillins     REACTION: hives    History   Social History  . Marital Status: Married    Spouse Name: N/A    Number of Children: N/A  . Years of Education: N/A   Occupational History  . Not on file.   Social History Main Topics  . Smoking status: Current Everyday Smoker -- 1.5 packs/day for 45 years  . Smokeless tobacco: Not on file  . Alcohol Use: Not on file  . Drug Use: Not on file  . Sexually Active: Not on file   Other Topics Concern  . 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      Family History  Problem Relation Age of Onset  . Emphysema Father   .  Allergies Sister   . Asthma Sister   . Heart disease Father   . Cancer Mother      Review of Systems: General: negative for chills, fever, night sweats or weight changes.  Cardiovascular: see above Dermatological: negative for rash Respiratory: negative for cough. occ wheezing Urologic: negative for hematuria Abdominal: negative for nausea, vomiting, diarrhea, bright red blood per rectum, melena, or hematemesis Neurologic: negative for visual changes, syncope, or dizziness All other systems reviewed and are otherwise negative except as noted above.  Labs:  Lab Results  Component Value Date   WBC 6.0 10/27/2011   HGB 12.6 10/27/2011   HCT 40.0 10/27/2011   MCV 89.7 10/27/2011   PLT 107* 10/27/2011     Lab 10/27/11 0558 10/24/11 1645  NA 139 --  K 3.8 --  CL 98 --  CO2 35* --  BUN 15 --  CREATININE 0.69 --  CALCIUM 9.1 --  PROT -- 6.3  BILITOT -- 0.6  ALKPHOS -- 128*  ALT -- 19  AST -- 31  GLUCOSE 126* --   Radiology/Studies:  Mr Lumbar Spine Wo Contrast 10/24/2011  *RADIOLOGY REPORT*  Clinical Data: Lower extremity weakness.  Severe low back pain.  MRI LUMBAR SPINE WITHOUT CONTRAST  Technique:  Multiplanar and multiecho pulse sequences of the lumbar spine were obtained without intravenous contrast.  Comparison: MRI of the lumbar spine  05/19/2009.  Findings: Normal signal is present in the conus medullaris which terminates at L1-2, within normal limits.  Rightward curvature of the lumbar spine is centered at L3 without significant interval change.  Chronic end plate marrow changes are present at T11-12, L1- 2, L4-5, and L5-S1.  Vertebral body heights and AP alignment are maintained.  Limited imaging of the abdomen is unremarkable.  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  L1-2:  Mild leftward disc bulging is present.  Moderate facet hypertrophy is present bilaterally.  No significant stenosis is present.  L2-3:  A leftward disc herniation is present.  Moderate facet hypertrophy is evident.  The left lateral recess narrowing has progressed.  There is mild encroachment into the foramina bilaterally without significant stenosis.  L3-4:  A broad-based disc herniation is present.  Moderate facet hypertrophy is present.  There is slight progression of mild central canal stenosis with left greater than right lateral recess narrowing.  The disc extends into the inferior recess of both neural foramina without significant stenosis.  L4-5:  A broad-based disc herniation is present.  Advanced facet hypertrophy is present.  There is progression of severe central canal stenosis.  Mild foraminal narrowing is present bilaterally.  L5-S1:  A broad-based disc herniation is present.  Moderate facet hypertrophy is evident.  This results in moderate right and mild left foraminal narrowing, slightly worse than on the prior exam.  IMPRESSION:  1.  Progression of severe central canal stenosis at L4-5. 2.  Progression of moderate right and mild left foraminal stenosis at L5-S1. 3.  Mild foraminal narrowing bilaterally at L4-5 is stable. 4.  Progression of mild central canal narrowing at L3-4 with left greater than right lateral recess narrowing. 5.  Progression of left lateral recess narrowing at L2-3.  6.  Congenitally short pedicles  contribute to multilevel spinal stenosis.   Original Report Authenticated By: Jamesetta Orleans. MATTERN, M.D.    Dg Knee Complete 4 Views Left 10/24/2011  *RADIOLOGY REPORT*  Clinical Data: History of pain and swelling involving the anterior and posterior aspects of the left knee with  no known injury.  LEFT KNEE - COMPLETE 4+ VIEW  Comparison: None.  Findings: There is obesity.  There is osteopenic appearance of bones.  There is narrowing of the medial joint space.  There is minimal patellar spurring.  There is slight fullness in the suprapatellar region on lateral examination which may reflect a small amount of joint effusion.  No chondrocalcinosis or opaque loose body is evident.  No fracture or dislocation is evident.  IMPRESSION: Obesity.  Osteopenic appearance of bones.  Narrowing of medial joint space.  Patellar spurring.  Question small joint effusion.   Original Report Authenticated By: Crawford Givens, M.D.    EKG: NSR 76bpm, LVH, left axis deviation, no acute changes  Physical Exam: Blood pressure 111/63, pulse 79, temperature 97.3 F (36.3 C), temperature source Oral, resp. rate 20, height 5' (1.524 m), weight 266 lb 11.2 oz (120.974 kg), SpO2 97.00%. General: Well developed obese WF in no acute distress. Head: Normocephalic, atraumatic, sclera non-icteric, no xanthomas, nares are without discharge. Tendency to mouth breathe. Neck: Bilateral carotid bruits. JVD not elevated. Lungs:  Very soft expiratory wheezing when she mouth breathes. Clear bilaterally to auscultation without wheezes, rales, or rhonchi. Breathing is unlabored. Heart: RRR. 3/6 SEM RUSB. Unable to fully appreciate S2. No rubs or gallops appreciated. Abdomen: Soft, non-tender, non-distended with normoactive bowel sounds. No hepatomegaly. No rebound/guarding. No obvious abdominal masses. Msk:  Strength and tone appear normal for age. Extremities: No clubbing or cyanosis. No edema.  Distal pedal pulses are 2+ and equal  bilaterally. Neuro: Alert and oriented X 3. Moves all extremities spontaneously. Psych:  Responds to questions appropriately with a normal affect.   Assessment and Plan:   1. Bilateral LE weakness felt multifactorial but in part due to spinal stenosis, with tentative plan for surgical lumbar decompression 2. Severe aortic stenosis 3. Chronic diastolic CHF 4. Chronic thrombocytopenia 5. Morbid obesity (BMI 52.2) 6. COPD with history of hypercarbic VDRF 2010 7. PVD  Discussed patient with Dr. Daleen Squibb. We feel that she is at extremely high risk for surgery and is a poor candidate for such given her history of severe AS, chronic diastolic CHF, morbid obesity, COPD with prior history of hypercarbic respiratory failure requiring intubation in 2010. General anesthesia would be associated with an unacceptable risk. Would continue current regimen for now.I have taken a history, reviewed medications, allergies, PMH, SH, FH, and reviewed ROS and examined the patient.  I agree with the assessment and plan. Discussed with patient and husband at length. All questions answered. They understand surgery is not an option.  Johnattan Strassman C. Daleen Squibb, MD, [facility] Pager:  [phone]    Signed, Ronie Spies PA-C 10/27/2011, 11:52 AM

## 2011-10-28 ENCOUNTER — Inpatient Hospital Stay (HOSPITAL_COMMUNITY): Payer: Medicare Other

## 2011-10-28 DIAGNOSIS — I359 Nonrheumatic aortic valve disorder, unspecified: Secondary | ICD-10-CM

## 2011-10-28 DIAGNOSIS — M4716 Other spondylosis with myelopathy, lumbar region: Secondary | ICD-10-CM

## 2011-10-28 DIAGNOSIS — L0291 Cutaneous abscess, unspecified: Secondary | ICD-10-CM

## 2011-10-28 MED ORDER — FUROSEMIDE 10 MG/ML IJ SOLN
40.0000 mg | Freq: Two times a day (BID) | INTRAMUSCULAR | Status: DC
  Administered 2011-10-28 – 2011-10-30 (×5): 40 mg via INTRAVENOUS
  Filled 2011-10-28 (×6): qty 4

## 2011-10-28 MED ORDER — POTASSIUM CHLORIDE CRYS ER 20 MEQ PO TBCR
40.0000 meq | EXTENDED_RELEASE_TABLET | Freq: Every day | ORAL | Status: DC
  Administered 2011-10-28 – 2011-10-30 (×3): 40 meq via ORAL
  Filled 2011-10-28 (×3): qty 2

## 2011-10-28 NOTE — Progress Notes (Signed)
Physical Therapy Treatment Patient Details Name: [name] MRN: [id number] DOB: Jun 07, 1945 Today's Date: 10/28/2011 Time: 4098-1191 PT Time Calculation (min): 26 min  PT Assessment / Plan / Recommendation Comments on Treatment Session  Pt presents with back pain and LE weakness.  Pt is not a candiate for surgery and had been OOB and in a chair for most of the day.  Pt ambulated 40 feet and became fatigued.  HR was within normative value and pulse ox was 95 with ambulation and 91 when seated.  After ambulation pt transfered to the bed and required assistance to lift LE onto bed.    Follow Up Recommendations  Inpatient Rehab    Barriers to Discharge        Equipment Recommendations  Tub/shower bench;Hospital bed    Recommendations for Other Services Rehab consult  Frequency Min 3X/week   Plan Discharge plan remains appropriate;Frequency remains appropriate    Precautions / Restrictions Precautions Precautions: Fall Restrictions Weight Bearing Restrictions: No   Pertinent Vitals/Pain 7-8/10 back pain    Mobility  Bed Mobility Bed Mobility: Sit to Supine;Sit to Sidelying Left Sit to Sidelying Left: 2: Max assist Sit to Sidelying Left: Patient Percentage: 40% Details for Bed Mobility Assistance: Pt unable to lift legs onto bed from sit to supine.  Husband reports lifting legs as pt lays back into bed.   Transfers Transfers: Stand to Sit;Sit to Stand Sit to Stand: 4: Min assist;With armrests Stand to Sit: 4: Min assist Details for Transfer Assistance: RW used for transfers and required cueing for UE and safety. Ambulation/Gait Ambulation/Gait Assistance: 4: Min guard;4: Min assist (Follow with chair to increase safety due to low endurance) Ambulation Distance (Feet): 40 Feet Assistive device: Rolling walker Ambulation/Gait Assistance Details: cues for posture, positioning in RW, and safety Gait Pattern: Step-through pattern;Decreased stride length;Trunk flexed Stairs:  No Wheelchair Mobility Wheelchair Mobility: No    Exercises     PT Diagnosis:    PT Problem List:   PT Treatment Interventions:     PT Goals Acute Rehab PT Goals Time For Goal Achievement: 11/08/11 Potential to Achieve Goals: Fair Pt will go Sit to Supine/Side: with supervision PT Goal: Sit to Supine/Side - Progress: Progressing toward goal Pt will go Sit to Stand: with supervision PT Goal: Sit to Stand - Progress: Progressing toward goal Pt will go Stand to Sit: with supervision PT Goal: Stand to Sit - Progress: Progressing toward goal Pt will Ambulate: 16 - 50 feet;with min assist;with rolling walker PT Goal: Ambulate - Progress: Progressing toward goal  Visit Information  Last PT Received On: 10/28/11 Assistance Needed: +1    Subjective Data  Subjective: Pt reports sitting in chair all day and wanted to transfer back to the bed.   Cognition  Overall Cognitive Status: Appears within functional limits for tasks assessed/performed Arousal/Alertness: Awake/alert Orientation Level: Appears intact for tasks assessed Behavior During Session: [facility] for tasks performed    Balance  Balance Balance Assessed: No  End of Session PT - End of Session Activity Tolerance: Patient limited by fatigue Patient left: in bed;with call bell/phone within reach;with family/visitor present Nurse Communication: Mobility status   GP     DITOMMASO, AMY 10/28/2011, 3:38 PM Amy Algis Liming, PT DPT [phone]

## 2011-10-28 NOTE — Progress Notes (Signed)
CSW met with pt at bedside to discuss pt current discharge plans and current living environment. Pt shared that she lives at home with pt husband who is able to provide 24 hour care as he is retired. Pt stated that she would like regain strength and ability to help with her care before returning home. During assessment pt was putting on lipgloss, and stated, "Its amazing how some lipstick can make you feel a little better".   Pt and csw discussed options of skilled nursing. Pt is open to [facility] as well as [facility]. Please see placement note for progress.  Pt first choice remains [facility].   Catha Gosselin, Theresia Majors  [phone] .10/28/2011 1727pm

## 2011-10-28 NOTE — Consult Note (Signed)
Physical Medicine and Rehabilitation Consult Reason for Consult: lumbar stenosis with back/LE pain,  BLE weakness Referring Physician:  Dr. Loreta Ave   HPI: [name] is a 66 y.o. female with hx of COPD (previous VDRF for hypercarbic respiratory failure  in 2010), chronic thrombocytopenia, morbid obesity, and aortic stenosis who is followed by Dr. Eden Emms. She has not previously been felt to be an operative candidate due to comorbidities for her AS, previously noted as severe by notes referring to echo 03/2010. She presented to [facility]  On 10/24/11 with complaints of numbness and pain in her bilateral lower extremities and in her back-constant and no relief with rest or positional change.  She has been dragging her right leg and unable to stand up. She denied bowel/bladder incontinence. Evaluated by Dr. Jule Ser and MRI spine done revealing progression of severe central canal stenosis at L4-5 and moderate canal stenosis at L5-S1.  Surgical decompression recommended pending cardiology work up. She was evaluated by Dr. Daleen Squibb yesterday who felt that patient was at extremely high risk due to multiple medical issues. PT evaluation was done on 10/25/11 and patient was noted to be lethargic, confused and had difficulty advancing BLE.  MD, PT recommending CIR to help maximize function.    Review of Systems  HENT: Negative for hearing loss.   Eyes: Negative for blurred vision and double vision.  Respiratory: Positive for shortness of breath (uses husbands's oxygen occasionally).   Cardiovascular: Negative for chest pain and palpitations.  Musculoskeletal: Positive for myalgias, back pain and joint pain (endstage changes bilateral knees).  Neurological: Positive for weakness. Negative for headaches.   Past Medical History  Diagnosis Date  . Carotid bruit     0-39% bilateral ICA by dopplers 03/2010  . Aortic stenosis     moderate echo 01/2009, then severe 03/2010. low risk myovue 11/2007 brest  attenuation EF  63%  . Venous insufficiency   . Arthritis   . Depression   . COPD (chronic obstructive pulmonary disease)   . Hypothyroidism   . Thrombocytopenia   . Hypokalemia   . Edema   . Respiratory failure     Ventilator-dependent respiratory failure secondary to hypercarbia, cardiac arrest, presumed penumonia 12/2008  . Cardiac arrest     12/2010 in setting of respiratory failure with less than 15 minutes of CPR; initial rhythm was asystole  . PVD (peripheral vascular disease)     Eval by Dr. Excell Seltzer 2012: noninvasive immaging suggested significant aortoiliac disease, ABIs in mild range  - med rx given significant comorbidities  . H/O endoscopy     For ?ampullary mass - had EGD 07/2010 with no evidence of ampullary mass, some CBD dilitation    Past Surgical History  Procedure Date  . Thyroidectomy   . Laparoscopic cholecystectomy   . Lithotripsy      for nephrolithiasis  . Vaginal hysterectomy      for menorrhagia  at age 11  . Breast cyst excision   . Rotator cuff repair   . Cesarean section   . Laminectomy    Family History  Problem Relation Age of Onset  . Emphysema Father   . Allergies Sister   . Asthma Sister   . Heart disease Father   . Cancer Mother    Social History:  Married.  Independent till 2 weeks PTA.  Husband has been providing total assist. reports that she has been smoking.  She does not have any smokeless tobacco history on file. Her alcohol and  drug histories not on file.   Allergies  Allergen Reactions  . Latex     REACTION: itch  . Penicillins     REACTION: hives   Medications Prior to Admission  Medication Sig Dispense Refill  . FLUoxetine (PROZAC) 20 MG capsule Take 20 mg by mouth daily.        . furosemide (LASIX) 80 MG tablet Take 80 mg by mouth daily.      Marland Kitchen levothyroxine (SYNTHROID, LEVOTHROID) 137 MCG tablet Take 137 mcg by mouth daily.      . metoprolol succinate (TOPROL-XL) 25 MG 24 hr tablet Take 25 mg by mouth daily.      Marland Kitchen  morphine (MS CONTIN) 60 MG 12 hr tablet Take 120 mg by mouth 2 (two) times daily.      Marland Kitchen morphine (MSIR) 15 MG tablet Take 15 mg by mouth every 4 (four) hours as needed. pain      . potassium chloride (KLOR-CON) 10 MEQ CR tablet Take 20 mEq by mouth daily. 2 tabs po qam and 1 po qhs        Home: Home Living Lives With: Spouse Available Help at Discharge: Available 24 hours/day Type of Home: House Home Access: Stairs to enter [employer] of Steps: 3 Home Layout: One level Bathroom Shower/Tub: Engineer, manufacturing systems: Standard Home Adaptive Equipment: Bedside commode/3-in-1;Walker - four wheeled;Wheelchair - manual Additional Comments: pt's husband has been using a makeshift ramp to get pt ina nd out of house in w/c but is working on having a real ramp built.    Functional History: Prior Function Meal Prep: Total Light Housekeeping: Total Able to Take Stairs?: No Driving: No Vocation: Retired Functional Status:  Mobility: Bed Mobility Bed Mobility: Supine to Sit;Sitting - Scoot to [place] of Bed;Sit to Sidelying Left Supine to Sit: 4: Min assist Sitting - Scoot to [place] of Bed: 5: Supervision Sit to Supine: 3: Mod assist Sit to Supine: Patient Percentage: 30% Sit to Sidelying Left: 1: +2 Total assist Sit to Sidelying Left: Patient Percentage: 30% Transfers Transfers: Sit to Stand;Stand to Sit Sit to Stand: 4: Min assist;With upper extremity assist;From bed;3: Mod assist;From chair/3-in-1 Sit to Stand: Patient Percentage: 70% Stand to Sit: 4: Min assist;With upper extremity assist;To chair/3-in-1;To bed Stand to Sit: Patient Percentage: 80% Stand Pivot Transfers: 1: +2 Total assist (pt 70%) Stand Pivot Transfers: Patient Percentage: 70% Ambulation/Gait Ambulation/Gait Assistance: 4: Min guard Ambulation Distance (Feet): 25 Feet Assistive device: Rolling walker Ambulation/Gait Assistance Details: cues for upright posture, positioning in RW, safety with turns.    Gait Pattern: Step-through pattern;Decreased stride length;Trunk flexed Stairs: No Wheelchair Mobility Wheelchair Mobility: No  ADL: ADL Eating/Feeding: Simulated;Set up Where Assessed - Eating/Feeding: Other (comment) (Sink level ) Grooming: Performed;Wash/dry hands;Teeth care;Denture care;Supervision/safety Where Assessed - Grooming: Supported standing Lower Body Dressing: Performed;Supervision/safety Where Assessed - Lower Body Dressing: Supported sitting Toilet Transfer: Performed;Min guard Statistician Method: Sit to Barista: Regular height toilet;Grab bars Equipment Used: Gait belt;Rolling walker Transfers/Ambulation Related to ADLs: decreased gait velocity, increases risk for fall  ADL Comments: Required min assistance to complete doffing her paints to toilet. Pt. states at times she is not quick enough to transfer to the toilet when using the restroom.   Cognition: Cognition Arousal/Alertness: Awake/alert Orientation Level: Oriented X4 Cognition Overall Cognitive Status: Appears within functional limits for tasks assessed/performed Area of Impairment: Attention;Following commands Arousal/Alertness: Awake/alert Orientation Level: Appears intact for tasks assessed Behavior During Session: [facility] for tasks performed Current  Attention Level: Other (comment) (Normal) Attention - Other Comments: decreased attention seems due to lethargy Following Commands: Follows one step commands with increased time  Blood pressure 126/54, pulse 87, temperature 98.3 F (36.8 C), temperature source Oral, resp. rate 20, height 5' (1.524 m), weight 120.657 kg (266 lb), SpO2 93.00%. Physical Exam  Nursing note and vitals reviewed. Constitutional: She appears well-developed and well-nourished.       Morbidly obese female, tends to close eyes and dose off.  Complaining of 8-9 range pain.  HENT:  Head: Normocephalic and atraumatic.  Eyes: Pupils are equal, round, and  reactive to light.  Neck: Normal range of motion.  Cardiovascular: Normal rate and regular rhythm.   Murmur heard. Pulmonary/Chest: Effort normal.  Abdominal: Soft. Bowel sounds are normal.  Musculoskeletal: She exhibits edema (2+ woody edema R>L with tenderness. Stasis changes noted. ).       Decreased ROM B-knees. Left knee with ext rotation and significant crepitus and pain with PROM.   Neurological:       Needs cues to stay alert.  Follows basic commands without difficulty. Stocking glove sensory loss RLE>LLE. UE grossly 4/5. Lower ext 1-2/5, left slightly stronger than right. dtr's trace.   Skin:       Chronic vasclar changes notes on either leg. Both legs with 2+ edema. Skin indurated distally on legs.     No results found for this or any previous visit (from the past 24 hour(s)). No results found.  Assessment/Plan: Diagnosis: lumbar stenosis with myelopathy, OA of the knees, peripheral neuropathy, multi-factorial gait disorder. 1. Does the need for close, 24 hr/day medical supervision in concert with the patient's rehab needs make it unreasonable for this patient to be served in a less intensive setting? Yes 2. Co-Morbidities requiring supervision/potential complications: chf, severe AS, morbid obesity 3. Due to bladder management, bowel management, safety, skin/wound care, disease management, medication administration, pain management and patient education, does the patient require 24 hr/day rehab nursing? Yes 4. Does the patient require coordinated care of a physician, rehab nurse, PT (1-2 hrs/day, 5 days/week) and OT (1-2 hrs/day, 5 days/week) to address physical and functional deficits in the context of the above medical diagnosis(es)? Yes Addressing deficits in the following areas: balance, endurance, locomotion, strength, transferring, bowel/bladder control, bathing, dressing, feeding, grooming, toileting and psychosocial support 5. Can the patient actively participate in an  intensive therapy program of at least 3 hrs of therapy per day at least 5 days per week? Yes 6. The potential for patient to make measurable gains while on inpatient rehab is excellent 7. Anticipated functional outcomes upon discharge from inpatient rehab are supevision to mod I with PT, supervision to mod I with OT, n/a with SLP. 8. Estimated rehab length of stay to reach the above functional goals is: 2 weeks 9. Does the patient have adequate social supports to accommodate these discharge functional goals? Yes and Potentially 10. Anticipated D/C setting: Home 11. Anticipated post D/C treatments: HH therapy 12. Overall Rehab/Functional Prognosis: excellent  RECOMMENDATIONS: This patient's condition is appropriate for continued rehabilitative care in the following setting: CIR Patient has agreed to participate in recommended program. Yes Note that insurance prior authorization may be required for reimbursement for recommended care.  Comment: Given the patient's severe stenosis and multiple co-morbidities, goals may ultimately be somewhat limited. However, she is willing to work and would benefit from the intensive setting (both physical and medical) of CIR. Rehab RN to follow up.   Ivory Broad, MD  10/28/2011   

## 2011-10-28 NOTE — Progress Notes (Signed)
Filed Vitals:   10/27/11 0932 10/27/11 1400 10/27/11 2100 10/28/11 0500  BP: 111/63 112/57 115/70 126/54  Pulse: 79 73 77 87  Temp:  98.4 F (36.9 C) 99.1 F (37.3 C) 98.3 F (36.8 C)  TempSrc:  Oral Oral Oral  Resp:  20    Height:      Weight:    120.657 kg (266 lb)  SpO2:  93% 93% 93%    CBC  Basename 10/27/11 0558  WBC 6.0  HGB 12.6  HCT 40.0  PLT 107*   BMET  Basename 10/27/11 0558  NA 139  K 3.8  CL 98  CO2 35*  GLUCOSE 126*  BUN 15  CREATININE 0.69  CALCIUM 9.1     Patient sitting up in chair eating breakfast. Patient and nurse reports that she is more mobile and ambulatory, with assistance. Cardiology consultation reviewed. Dr. Juanito Doom feels that "General anesthesia would be associated with an unacceptable risk." and that "surgery is not an option". Discussed situation with patient, she understands accepts Dr. Anola Gurney assessment and recommendations.  Plan: Patient is not a candidate for surgery. She has not responded to previous epidurals steroid injections. At this point I feel that inpatient rehabilitation, with transition to home we'll offer the best hope for maximizing function. I discussed with the patient,, as far as outpatient activities that may be better tolerated would include [facility]. I don't feel that we have anything further to offer from a neurosurgical perspective, further neurosurgical followup is not indicated at this time, she'll be able to followup with her primary physician in the long run at this point.   Hewitt Shorts, MD 10/28/2011, 8:29 AM

## 2011-10-28 NOTE — Progress Notes (Signed)
Patient Name: [name] Date of Encounter: 10/28/2011  Principal Problem:  *Lower extremity weakness Active Problems:  AORTIC STENOSIS  COPD  Spinal stenosis of lumbar region  Chronic diastolic CHF (congestive heart failure)  HYPOTHYROIDISM  HYPOKALEMIA  THROMBOCYTOPENIA  DEPRESSION  PVD (peripheral vascular disease)  Morbid obesity   SUBJECTIVE  No chest pain.  She does experience DOE with limited activity.  Biggest complaint remains leg pain/wkns and lower back pain.  CURRENT MEDS    . docusate sodium  100 mg Oral BID  . enoxaparin (LOVENOX) injection  60 mg Subcutaneous Q24H  . FLUoxetine  20 mg Oral Daily  . furosemide  80 mg Oral Daily  . levothyroxine  137 mcg Oral Q0600  . metoprolol succinate  25 mg Oral Daily  . morphine  120 mg Oral Q12H  . nicotine  14 mg Transdermal Daily  . potassium chloride  20 mEq Oral Daily  . senna-docusate  3 tablet Oral QHS   OBJECTIVE  Filed Vitals:   10/27/11 0932 10/27/11 1400 10/27/11 2100 10/28/11 0500  BP: 111/63 112/57 115/70 126/54  Pulse: 79 73 77 87  Temp:  98.4 F (36.9 C) 99.1 F (37.3 C) 98.3 F (36.8 C)  TempSrc:  Oral Oral Oral  Resp:  20    Height:      Weight:    266 lb (120.657 kg)  SpO2:  93% 93% 93%   No intake or output data in the 24 hours ending 10/28/11 0702 Filed Weights   10/25/11 0240 10/28/11 0500  Weight: 266 lb 11.2 oz (120.974 kg) 266 lb (120.657 kg)   PHYSICAL EXAM  General: Pleasant, NAD. Neuro: Alert and oriented X 3. Moves all extremities spontaneously. Psych: Normal affect. HEENT:  Normal  Neck: Supple, obese, difficult to assess JVD.  Bilat bruits vs radiated murmur. Lungs:  Resp regular and unlabored, CTA. Heart: RRR no s3, s4.  3/6 SEM loudest @ bilat usb, radiating to neck bilat. Abdomen: Soft, non-tender, non-distended, BS + x 4.  Extremities: No clubbing, cyanosis. 2+ chronic edema to knees bilaterally.  DP/PT/Radials 2+ and equal bilaterally.  Accessory Clinical  Findings  CBC  Basename 10/27/11 0558  WBC 6.0  NEUTROABS --  HGB 12.6  HCT 40.0  MCV 89.7  PLT 107*   Basic Metabolic Panel  Basename 10/27/11 0558  NA 139  K 3.8  CL 98  CO2 35*  GLUCOSE 126*  BUN 15  CREATININE 0.69  CALCIUM 9.1  MG --  PHOS --   TELE  RSR/ST  ASSESSMENT AND PLAN  1.  Lower Extremity Weakness & Spinal Stenosis:  Unfortunately, secondary to multiple co-morbidities, pt is a poor surgical candidate.  She is currently being evaluated by PT/OT and being considered for short-term acute rehab.  2.  Severe Ao Stenosis:  Not felt to be an operable candidate.  ? TAVR as an option.  Will review with MD.  No chest pain, pre-syncope/syncope, or evidence of CHF @ this time.  She does have chronic DOE, which has not worsened acutely.  3.  Chronic Diast CHF:  Obesity limits assessment of volume status but appears to be stable.  Weight is unchanged since admission.  HR/BP well-controlled.  4.  COPD/Tob Abuse:  No active wheezing.  Nicotine patch in place.  Smoking cessation advised.  Signed, Nicolasa Ducking NP  Patient seen with NP, agree with the above note.  Poor surgical candidate for spine surgery due to severe AS.  Poor candidate for  traditional AVR.  I reviewed Dr. Fabio Bering last note and she is not felt to be a good TAVR candidate due to PAD.  Plan for now is for inpatient rehab.  She does report some dyspnea and has been coughing up green phlegm.  I will get a CXR.  She does appear volume overloaded.  I will try careful IV diuresis for a day or two, using Lasix 40 mg IV bid.  Marca Ancona 10/28/2011 8:55 AM

## 2011-10-28 NOTE — Care Management Note (Unsigned)
    Page 1 of 1   10/28/2011     4:48:34 PM   CARE MANAGEMENT NOTE 10/28/2011  Patient:  [name], [name]   Account Number:  [id number]  Date Initiated:  10/28/2011  Documentation initiated by:  GRAVES-BIGELOW,Edrian Melucci  Subjective/Objective Assessment:   Pt admitted with inability to ambulate- due to severe lumbar stenosis l 4-5. Pt is form home with family. Per MD notes pt is not a good candidate for surgery.     Action/Plan:   CIR consultd. CM will f/u in am for disposiiton needs.   Anticipated DC Date:  10/29/2011   Anticipated DC Plan:  HOME W HOME HEALTH SERVICES      DC Planning Services  CM consult      Choice offered to / List presented to:             Status of service:  In process, will continue to follow Medicare Important Message given?   (If response is "NO", the following Medicare IM given date fields will be blank) Date Medicare IM given:   Date Additional Medicare IM given:    Discharge Disposition:    Per UR Regulation:  Reviewed for med. necessity/level of care/duration of stay  If discussed at Long Length of Stay Meetings, dates discussed:    Comments:

## 2011-10-28 NOTE — Progress Notes (Signed)
UR Completed Antwion Carpenter Graves-Bigelow, RN,BSN [phone]  

## 2011-10-28 NOTE — Progress Notes (Signed)
[facility]             Progress Note   Subjective: Continued bilateral LE weakness and difficulty ambulating per patient. Complains of green sputum.  Objective: Vital signs in last 24 hours: Temp:  [98.3 F (36.8 C)-99.1 F (37.3 C)] 98.3 F (36.8 C) (09/03 0500) Pulse Rate:  [73-87] 87  (09/03 0500) Resp:  [20] 20  (09/02 1400) BP: (111-126)/(54-70) 126/54 mmHg (09/03 0500) SpO2:  [93 %] 93 % (09/03 0500) Weight:  [120.657 kg (266 lb)] 120.657 kg (266 lb) (09/03 0500) Weight change:  Last BM Date: 10/27/11  Intake/Output from previous day:   Total I/O In: 480 [P.O.:480] Out: -    Physical Exam: General: Alert, awake, oriented x3, in no acute distress, obese. HEENT: No bruits, no goiter. Heart: Regular rate and rhythm, +SEM. Lungs: Clear to auscultation bilaterally. Abdomen: Soft, nontender, nondistended, positive bowel sounds. Extremities: 1+ edema bilaterally Neuro: Grossly intact, nonfocal.    Lab Results: Basic Metabolic Panel:  Basename 10/27/11 0558  NA 139  K 3.8  CL 98  CO2 35*  GLUCOSE 126*  BUN 15  CREATININE 0.69  CALCIUM 9.1  MG --  PHOS --   CBC:  Basename 10/27/11 0558  WBC 6.0  NEUTROABS --  HGB 12.6  HCT 40.0  MCV 89.7  PLT 107*   Urine Drug Screen: Drugs of Abuse     Component Value Date/Time   LABOPIA NEGATIVE 01/15/2009 0117   COCAINSCRNUR NEGATIVE 01/15/2009 0117   LABBENZ  Value: POSITIVE (NOTE) Sent for confirmatory testing Result repeated and verified.* 01/15/2009 0117   AMPHETMU NEGATIVE 01/15/2009 0117      Studies/Results: No results found.  Medications: Scheduled Meds:    . docusate sodium  100 mg Oral BID  . enoxaparin (LOVENOX) injection  60 mg Subcutaneous Q24H  . FLUoxetine  20 mg Oral Daily  . furosemide  40 mg Intravenous BID  . levothyroxine  137 mcg Oral Q0600  . metoprolol succinate  25 mg Oral Daily  . morphine  120 mg Oral Q12H  . nicotine  14 mg Transdermal Daily  .  potassium chloride  40 mEq Oral Daily  . senna-docusate  3 tablet Oral QHS  . DISCONTD: furosemide  80 mg Oral Daily  . DISCONTD: potassium chloride  20 mEq Oral Daily   Continuous Infusions:  PRN Meds:.acetaminophen, acetaminophen, alum & mag hydroxide-simeth, morphine, morphine injection, ondansetron (ZOFRAN) IV, ondansetron, DISCONTD: senna-docusate  Assessment/Plan:  Principal Problem:  *Lower extremity weakness Active Problems:  HYPOTHYROIDISM  HYPOKALEMIA  THROMBOCYTOPENIA  DEPRESSION  AORTIC STENOSIS  COPD  PVD (peripheral vascular disease)  Spinal stenosis of lumbar region  Chronic diastolic CHF (congestive heart failure)  Morbid obesity   #1 Bilateral LE weakness: likely multifactorial: spinal stenosis, obesity, mild PVD, arthritis of knees, chronic edema.  Agree with cardiology's assessment of patient being a very poor surgical candidate given her severe AS, obesity and COPD. Plan for CIR eval.  #2 Aortic Stenosis/chronic diastolic CHF:  On ECHO done this admission she has grade 2 diastolic dysfunction as well as severe aortic stenosis with a valve area of 0.94. Both are stable. She does have chronic DOE.  #3 COPD: Stable.  #4 Thrombocytopenia: This is chronic. As far back as 2009 from records that I can see. She used to be followed by Dr[facility] Gaylyn Rong at the cancer center but was discharged from his practice as her platelet count had been stable for a few years. It has ranged from  about 90-110,000, which is her baseline. No further intervention at this point.   #5 Sputum production: lungs are CTA. Will get CXR to see if she may be developing a PNA.  #6 Dispo: CIR consult; if not SNF vs HH.  Rest of chronic issues are currently stable.   LOS: 4 days   HERNANDEZ [name],[name] [facility] Pager: [phone] 10/28/2011, 9:11 AM

## 2011-10-29 LAB — CBC
HCT: 40.3 % (ref 36.0–46.0)
Hemoglobin: 12.8 g/dL (ref 12.0–15.0)
MCV: 90.2 fL (ref 78.0–100.0)
RDW: 14.4 % (ref 11.5–15.5)
WBC: 6.5 10*3/uL (ref 4.0–10.5)

## 2011-10-29 LAB — BASIC METABOLIC PANEL
CO2: 36 mEq/L — ABNORMAL HIGH (ref 19–32)
Chloride: 100 mEq/L (ref 96–112)
Creatinine, Ser: 0.75 mg/dL (ref 0.50–1.10)
GFR calc Af Amer: >90 mL/min (ref >90)
Potassium: 3.8 mEq/L (ref 3.5–5.1)

## 2011-10-29 MED ORDER — MOXIFLOXACIN HCL 400 MG PO TABS
400.0000 mg | ORAL_TABLET | Freq: Every day | ORAL | Status: DC
  Administered 2011-10-29: 400 mg via ORAL
  Filled 2011-10-29 (×2): qty 1

## 2011-10-29 NOTE — Progress Notes (Signed)
Pt pending CIR English as a second language teacher. CSW initiated snf search and pasarr yesterday. Pt pasarr number pending manual review. Pt dc anticipated for tomorrow due to insurance and pasarr delays. Pt MD aware. .Clinical social worker continuing to follow pt to assist with pt dc plans and further csw needs.   Catha Gosselin, Theresia Majors  [phone] .10/29/2011 1235pm

## 2011-10-29 NOTE — Progress Notes (Signed)
CIR: Have not received word from pt's insurance co regarding authorization for CIR. Will f/u in AM. [phone].

## 2011-10-29 NOTE — Progress Notes (Signed)
CIR Admissions: Met with pt and spouse to discuss CIR. Have begun preauthorization process with pt's insurance and faxed clinicals. Informed SW. Await authorization. Call for questions [phone]

## 2011-10-29 NOTE — Discharge Summary (Signed)
Physician Discharge Summary  [name] WUJ:[id number] DOB: 1946/01/24 DOA: 10/24/2011  PCP: [name], MD  Admit date: 10/24/2011 Discharge date: 10/30/2011  Recommendations for Outpatient Follow-up:  1. Patient is being discharged to inpatient rehabilitation versus short-term skilled nursing  Discharge Diagnoses:  Principal Problem:  *Lower extremity weakness Active Problems:  HYPOTHYROIDISM  HYPOKALEMIA  THROMBOCYTOPENIA  DEPRESSION  AORTIC STENOSIS  COPD  PVD (peripheral vascular disease)  Spinal stenosis of lumbar region  Chronic diastolic CHF (congestive heart failure)  Morbid obesity   Discharge Condition: Stable, mild improvement, discharge to either inpatient rehabilitation versus skilled nursing facility  Diet recommendation: Heart healthy  Filed Weights   10/25/11 0240 10/28/11 0500 10/29/11 0500  Weight: 120.974 kg (266 lb 11.2 oz) 120.657 kg (266 lb) 116.03 kg (255 lb 12.8 oz)    History of present illness:  Patient is a 66 year old white female past medical history COPD and severe aortic stenosis who presented with complaints of bilateral numbness and pain in her lower extremities making it difficult to stand or walk and found to have moderate to severe spinal stenosis. Surgical decompression was recommended however she was felt to be an extremely high risk candidate due to her AS.   Hospital Course:  Bilateral lower extremity weakness: Felt to be multifactorial including spinal stenosis, obesity, mild PVD, arthritis and chronic edema. Neurosurgery recommended surgery for spinal stenosis but cardiology feels the patient to be a very poor surgical candidate given her aortic stenosis and COPD. Inpatient rehabilitation has been consult at and is tentatively except for the patient but is pending insurance approval. If this does not work, patient will go to a short-term skilled nursing facility.  Aortic stenosis/chronic diastolic CHF: Echo noted grade 2  diastolic dysfunction plus severe AS with a valve area of 0.94. Currently stable. Patient has chronic dyspnea on exertion.  COPD: Stable  Thrombocytopenia: Chronic followed by hematology but the cat has remained stable for the past 2 years.  Pneumonia: Chest x-ray done because of increased sputum production.  This noted mild infiltrate. Patient started on by mouth Avelox.  Procedures:  Echocardiogram noted grade 2 diastolic dysfunction plus severe aortic stenosis without area 0.94  Consultations:  Nishan-Cardiology  Swartz-PMR  Nudelman-Neurosurgery  Discharge Exam: Filed Vitals:   10/29/11 1426  BP: 139/64  Pulse: 73  Temp: 98.1 F (36.7 C)  Resp: 19   Filed Vitals:   10/29/11 0500 10/29/11 1030 10/29/11 1040 10/29/11 1426  BP: 146/76   139/64  Pulse: 80   73  Temp: 97.6 F (36.4 C)   98.1 F (36.7 C)  TempSrc: Oral   Oral  Resp: 20   19  Height:      Weight: 116.03 kg (255 lb 12.8 oz)     SpO2: 98% 88% 93% 96%    General: Alert and oriented x3, in no acute distress, fatigue, looks older than stated age HEENT: Normocephalic and atraumatic, mucous members are slightly dry Cardiovascular: Regular rate and rhythm, S1-S2, 3/6 holosystolic murmur Respiratory: Decreased breath sounds at bases Abdomen: Soft, obese, nontender, positive bowel sounds Extremities: 1-2+ pitting edema. Right posterior may show signs of chronic venous stasis. No signs of acute cellulitis.  Discharge Instructions   Medication List  As of 10/29/2011  4:28 PM   ASK your doctor about these medications         FLUoxetine 20 MG capsule   Commonly known as: PROZAC   Take 20 mg by mouth daily.      furosemide 80  MG tablet   Commonly known as: LASIX   Take 80 mg by mouth daily.      levothyroxine 137 MCG tablet   Commonly known as: SYNTHROID, LEVOTHROID   Take 137 mcg by mouth daily.      metoprolol succinate 25 MG 24 hr tablet   Commonly known as: TOPROL-XL   Take 25 mg by mouth daily.        morphine 15 MG tablet   Commonly known as: MSIR   Take 15 mg by mouth every 4 (four) hours as needed. pain      morphine 60 MG 12 hr tablet   Commonly known as: MS CONTIN   Take 120 mg by mouth 2 (two) times daily.      potassium chloride 10 MEQ CR tablet   Commonly known as: KLOR-CON   Take 20 mEq by mouth daily. 2 tabs po qam and 1 po qhs              The results of significant diagnostics from this hospitalization (including imaging, microbiology, ancillary and laboratory) are listed below for reference.    Significant Diagnostic Studies: Dg Chest 2 View 10/28/2011  IMPRESSION: 1.  Small patchy infiltrate in the right middle lobe. 2.  Bronchitic changes.   Original Report Authenticated By: Gwynn Burly, M.D.    Mr Lumbar Spine Wo Contrast 10/24/2011  IMPRESSION:  1.  Progression of severe central canal stenosis at L4-5. 2.  Progression of moderate right and mild left foraminal stenosis at L5-S1. 3.  Mild foraminal narrowing bilaterally at L4-5 is stable. 4.  Progression of mild central canal narrowing at L3-4 with left greater than right lateral recess narrowing. 5.  Progression of left lateral recess narrowing at L2-3.  6.  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   Original Report Authenticated By: Jamesetta Orleans. MATTERN, M.D.    Dg Knee Complete 4 Views Left 10/24/2011   IMPRESSION: Obesity.  Osteopenic appearance of bones.  Narrowing of medial joint space.  Patellar spurring.  Question small joint effusion.   Original Report Authenticated By: Crawford Givens, M.D.      Labs: Basic Metabolic Panel:  Lab 10/29/11 1610 10/27/11 0558 10/25/11 0500 10/24/11 1645  NA 141 139 140 140  K 3.8 3.8 4.5 4.0  CL 100 98 104 103  CO2 36* 35* 29 32  GLUCOSE 171* 126* 117* 103*  BUN 16 15 17 20   CREATININE 0.75 0.69 0.70 0.69  CALCIUM 9.2 9.1 8.8 8.8  MG -- -- -- --  PHOS -- -- -- --   Liver Function Tests:  Lab 10/24/11 1645  AST 31  ALT 19   ALKPHOS 128*  BILITOT 0.6  PROT 6.3  ALBUMIN 3.1*   CBC:  Lab 10/29/11 0550 10/27/11 0558 10/25/11 0500 10/24/11 1645  WBC 6.5 6.0 6.0 6.2  NEUTROABS -- -- -- 4.3  HGB 12.8 12.6 12.0 12.0  HCT 40.3 40.0 37.4 37.0  MCV 90.2 89.7 90.3 89.4  PLT 112* 107* 90* 93*    Time coordinating discharge: 40 minutes  Signed:  Hollice Espy  [facility] 10/29/2011, 4:28 PM

## 2011-10-29 NOTE — Progress Notes (Signed)
Patient ID: Elder Love, female   DOB: [birth year], 66 y.o.   MRN: [id number]   Patient Name: [name] Date of Encounter: 10/29/2011  Principal Problem:  *Lower extremity weakness Active Problems:  HYPOTHYROIDISM  HYPOKALEMIA  THROMBOCYTOPENIA  DEPRESSION  AORTIC STENOSIS  COPD  PVD (peripheral vascular disease)  Spinal stenosis of lumbar region  Chronic diastolic CHF (congestive heart failure)  Morbid obesity   SUBJECTIVE  No chest pain.  She does experience DOE with limited activity.  Biggest complaint remains leg pain/wkns and lower back pain.  CURRENT MEDS    . docusate sodium  100 mg Oral BID  . enoxaparin (LOVENOX) injection  60 mg Subcutaneous Q24H  . FLUoxetine  20 mg Oral Daily  . furosemide  40 mg Intravenous BID  . levothyroxine  137 mcg Oral Q0600  . metoprolol succinate  25 mg Oral Daily  . morphine  120 mg Oral Q12H  . nicotine  14 mg Transdermal Daily  . potassium chloride  40 mEq Oral Daily  . senna-docusate  3 tablet Oral QHS  . DISCONTD: furosemide  80 mg Oral Daily  . DISCONTD: potassium chloride  20 mEq Oral Daily   OBJECTIVE  Filed Vitals:   10/28/11 1052 10/28/11 1454 10/28/11 2100 10/29/11 0500  BP: 102/56 120/69 116/66 146/76  Pulse: 75 73 70 80  Temp:  97.9 F (36.6 C) 98.4 F (36.9 C) 97.6 F (36.4 C)  TempSrc:  Oral Oral Oral  Resp:   18 20  Height:      Weight:    255 lb 12.8 oz (116.03 kg)  SpO2:  93% 96% 98%    Intake/Output Summary (Last 24 hours) at 10/29/11 0825 Last data filed at 10/29/11 0500  Gross per 24 hour  Intake    840 ml  Output      0 ml  Net    840 ml   Filed Weights   10/25/11 0240 10/28/11 0500 10/29/11 0500  Weight: 266 lb 11.2 oz (120.974 kg) 266 lb (120.657 kg) 255 lb 12.8 oz (116.03 kg)   PHYSICAL EXAM  General: Pleasant, NAD. Neuro: Alert and oriented X 3. Moves all extremities spontaneously. Psych: Normal affect. HEENT:  Normal  Neck: Supple, obese, difficult to assess JVD.  Bilat bruits  vs radiated murmur. Lungs:  Resp regular and unlabored, CTA. Heart: RRR no s3, s4.  3/6 SEM loudest @ bilat usb, radiating to neck bilat. Abdomen: Soft, non-tender, non-distended, BS + x 4.  Extremities: No clubbing, cyanosis. 2+ chronic edema to knees bilaterally.  DP/PT/Radials 2+ and equal bilaterally. Erythema RLE   Accessory Clinical Findings  CBC  Basename 10/29/11 0550 10/27/11 0558  WBC 6.5 6.0  NEUTROABS -- --  HGB 12.8 12.6  HCT 40.3 40.0  MCV 90.2 89.7  PLT 112* 107*   Basic Metabolic Panel  Basename 10/29/11 0550 10/27/11 0558  NA 141 139  K 3.8 3.8  CL 100 98  CO2 36* 35*  GLUCOSE 171* 126*  BUN 16 15  CREATININE 0.75 0.69  CALCIUM 9.2 9.1  MG -- --  PHOS -- --   TELE  RSR/ST  ASSESSMENT AND PLAN  1.  Lower Extremity Weakness & Spinal Stenosis:  Unfortunately, secondary to multiple co-morbidities, pt is a poor surgical candidate.  She is currently being evaluated by PT/OT and being considered for short-term acute rehab.  2.  Severe Ao Stenosis:  Not an operative or TAVR candidate    3.  Chronic Diast CHF:  Continue iv bid lasix  Skin care consult  Consider antibiotics for insipient RLE cellulitis  4.  COPD/Tob Abuse:  No active wheezing.  Nicotine patch in place.  Smoking cessation advised.  Signed, Charlton Haws NP

## 2011-10-29 NOTE — Progress Notes (Signed)
Physical Therapy Treatment Patient Details Name: [name] MRN: [id number] DOB: [birth year] Today's Date: 10/29/2011 Time: 4098-1191 PT Time Calculation (min): 14 min  PT Assessment / Plan / Recommendation Comments on Treatment Session  Pt admitted with back pain and bilateral LE weakness.  Pt motivated to work with PT, but limited by fatigue.  Able to tolerate slight ambulation distance as well as bilateral LE therapeutic exercises.    Follow Up Recommendations  Inpatient Rehab    Barriers to Discharge        Equipment Recommendations  Defer to next venue    Recommendations for Other Services    Frequency Min 3X/week   Plan Discharge plan remains appropriate;Frequency remains appropriate    Precautions / Restrictions Precautions Precautions: Fall Restrictions Weight Bearing Restrictions: No   Pertinent Vitals/Pain 4/10 chronic back pain.  Pt repositioned.    Mobility  Bed Mobility Bed Mobility: Not assessed Transfers Transfers: Sit to Stand;Stand to Sit Sit to Stand: 4: Min guard;With upper extremity assist;From toilet Stand to Sit: 4: Min guard;With upper extremity assist;To chair/3-in-1 Details for Transfer Assistance: Guarding for balance with cues for safest hand placement and sequence. Ambulation/Gait Ambulation/Gait Assistance: 4: Min assist Ambulation Distance (Feet): 15 Feet Assistive device: Rolling walker Ambulation/Gait Assistance Details: Assist for balance today due to fatigue with distance greatly limited.  Cues for tall posture and safety inside RW with tendency to travel outside RW keeping it far from self. Gait Pattern: Step-through pattern;Decreased stride length;Trunk flexed Stairs: No Wheelchair Mobility Wheelchair Mobility: No    Exercises General Exercises - Lower Extremity Long Arc Quad: AROM;Both;10 reps;Seated Hip Flexion/Marching: AROM;Both;10 reps;Seated Toe Raises: AROM;Both;10 reps;Seated Heel Raises: AROM;Both;10 reps;Seated   PT  Diagnosis:    PT Problem List:   PT Treatment Interventions:     PT Goals Acute Rehab PT Goals PT Goal Formulation: With patient/family Time For Goal Achievement: 11/08/11 Potential to Achieve Goals: Fair PT Goal: Sit to Stand - Progress: Progressing toward goal PT Goal: Stand to Sit - Progress: Progressing toward goal PT Goal: Ambulate - Progress: Progressing toward goal PT Goal: Perform Home Exercise Program - Progress: Progressing toward goal  Visit Information  Last PT Received On: 10/29/11 Assistance Needed: +1    Subjective Data  Subjective: "I'm just a little fatigued this morning." Patient Stated Goal: return home and be able to walk   Cognition  Overall Cognitive Status: Appears within functional limits for tasks assessed/performed Arousal/Alertness: Awake/alert Orientation Level: Appears intact for tasks assessed Behavior During Session: [facility] for tasks performed    Balance  Balance Balance Assessed: No  End of Session PT - End of Session Activity Tolerance: Patient limited by fatigue Patient left: in chair;with call bell/phone within reach;with family/visitor present Nurse Communication: Mobility status   GP     Cephus Shelling 10/29/2011, 11:50 AM  10/29/2011 Cephus Shelling, PT, DPT [phone]

## 2011-10-30 ENCOUNTER — Inpatient Hospital Stay (HOSPITAL_COMMUNITY)
Admission: RE | Admit: 2011-10-30 | Discharge: 2011-11-07 | DRG: 945 | Disposition: A | Discharge status: Home / Self Care | Payer: Medicare Other | Source: Ambulatory Visit | Attending: Physical Medicine & Rehabilitation | Admitting: Physical Medicine & Rehabilitation

## 2011-10-30 ENCOUNTER — Encounter (HOSPITAL_COMMUNITY): Payer: Self-pay | Admitting: Physical Medicine and Rehabilitation

## 2011-10-30 ENCOUNTER — Encounter (HOSPITAL_COMMUNITY): Payer: Self-pay | Admitting: *Deleted

## 2011-10-30 DIAGNOSIS — J4489 Other specified chronic obstructive pulmonary disease: Secondary | ICD-10-CM | POA: Diagnosis present

## 2011-10-30 DIAGNOSIS — M48061 Spinal stenosis, lumbar region without neurogenic claudication: Secondary | ICD-10-CM | POA: Diagnosis present

## 2011-10-30 DIAGNOSIS — Z5189 Encounter for other specified aftercare: Secondary | ICD-10-CM

## 2011-10-30 DIAGNOSIS — I359 Nonrheumatic aortic valve disorder, unspecified: Secondary | ICD-10-CM | POA: Diagnosis present

## 2011-10-30 DIAGNOSIS — I5033 Acute on chronic diastolic (congestive) heart failure: Secondary | ICD-10-CM

## 2011-10-30 DIAGNOSIS — D696 Thrombocytopenia, unspecified: Secondary | ICD-10-CM | POA: Diagnosis present

## 2011-10-30 DIAGNOSIS — I872 Venous insufficiency (chronic) (peripheral): Secondary | ICD-10-CM | POA: Diagnosis present

## 2011-10-30 DIAGNOSIS — I509 Heart failure, unspecified: Secondary | ICD-10-CM | POA: Diagnosis present

## 2011-10-30 DIAGNOSIS — R609 Edema, unspecified: Secondary | ICD-10-CM | POA: Diagnosis present

## 2011-10-30 DIAGNOSIS — F17200 Nicotine dependence, unspecified, uncomplicated: Secondary | ICD-10-CM | POA: Diagnosis present

## 2011-10-30 DIAGNOSIS — J449 Chronic obstructive pulmonary disease, unspecified: Secondary | ICD-10-CM | POA: Diagnosis present

## 2011-10-30 HISTORY — DX: Spinal stenosis, lumbar region without neurogenic claudication: M48.061

## 2011-10-30 MED ORDER — NICOTINE 14 MG/24HR TD PT24
14.0000 mg | MEDICATED_PATCH | Freq: Every day | TRANSDERMAL | Status: DC
  Administered 2011-10-31 – 2011-11-07 (×8): 14 mg via TRANSDERMAL
  Filled 2011-10-30 (×10): qty 1

## 2011-10-30 MED ORDER — MORPHINE SULFATE 15 MG PO TABS: 15.0000 mg | ORAL_TABLET | ORAL | Status: DC | PRN

## 2011-10-30 MED ORDER — ONDANSETRON HCL 4 MG PO TABS
4.0000 mg | ORAL_TABLET | Freq: Four times a day (QID) | ORAL | Status: DC | PRN
  Administered 2011-11-01 – 2011-11-03 (×2): 4 mg via ORAL
  Filled 2011-10-30 (×2): qty 1

## 2011-10-30 MED ORDER — FUROSEMIDE 40 MG PO TABS
40.0000 mg | ORAL_TABLET | Freq: Two times a day (BID) | ORAL | Status: DC
  Administered 2011-10-30 – 2011-11-02 (×7): 40 mg via ORAL
  Filled 2011-10-30 (×11): qty 1

## 2011-10-30 MED ORDER — PROCHLORPERAZINE MALEATE 5 MG PO TABS
5.0000 mg | ORAL_TABLET | Freq: Four times a day (QID) | ORAL | Status: DC | PRN
  Filled 2011-10-30: qty 2

## 2011-10-30 MED ORDER — ONDANSETRON HCL 4 MG/2ML IJ SOLN: 4.0000 mg | Freq: Four times a day (QID) | INTRAMUSCULAR | Status: DC | PRN

## 2011-10-30 MED ORDER — TRAZODONE HCL 50 MG PO TABS: 25.0000 mg | ORAL_TABLET | Freq: Every evening | ORAL | Status: DC | PRN

## 2011-10-30 MED ORDER — FLUOXETINE HCL 20 MG PO CAPS: 20.0000 mg | ORAL_CAPSULE | Freq: Every day | ORAL | Status: DC

## 2011-10-30 MED ORDER — METOPROLOL SUCCINATE ER 25 MG PO TB24
25.0000 mg | ORAL_TABLET | Freq: Every day | ORAL | Status: DC
  Filled 2011-10-30: qty 1

## 2011-10-30 MED ORDER — PROCHLORPERAZINE EDISYLATE 5 MG/ML IJ SOLN
5.0000 mg | Freq: Four times a day (QID) | INTRAMUSCULAR | Status: DC | PRN
  Filled 2011-10-30: qty 2

## 2011-10-30 MED ORDER — MORPHINE SULFATE ER 60 MG PO TBCR
120.0000 mg | EXTENDED_RELEASE_TABLET | Freq: Two times a day (BID) | ORAL | Status: DC
  Administered 2011-10-31 – 2011-11-01 (×3): 120 mg via ORAL
  Filled 2011-10-30 (×3): qty 2

## 2011-10-30 MED ORDER — MORPHINE SULFATE 15 MG PO TABS
15.0000 mg | ORAL_TABLET | ORAL | Status: DC | PRN
  Administered 2011-10-31 – 2011-11-05 (×3): 15 mg via ORAL
  Filled 2011-10-30 (×3): qty 1
  Filled 2011-10-30: qty 6

## 2011-10-30 MED ORDER — MORPHINE SULFATE ER 15 MG PO TBCR: 120.0000 mg | EXTENDED_RELEASE_TABLET | Freq: Two times a day (BID) | ORAL | Status: DC

## 2011-10-30 MED ORDER — METOPROLOL SUCCINATE ER 25 MG PO TB24
25.0000 mg | ORAL_TABLET | Freq: Every day | ORAL | Status: DC
  Administered 2011-10-31 – 2011-11-07 (×8): 25 mg via ORAL
  Filled 2011-10-30 (×10): qty 1

## 2011-10-30 MED ORDER — LEVOTHYROXINE SODIUM 137 MCG PO TABS
137.0000 ug | ORAL_TABLET | Freq: Every day | ORAL | Status: DC
  Filled 2011-10-30: qty 1

## 2011-10-30 MED ORDER — GUAIFENESIN-DM 100-10 MG/5ML PO SYRP: 5.0000 mL | ORAL_SOLUTION | Freq: Four times a day (QID) | ORAL | Status: DC | PRN

## 2011-10-30 MED ORDER — ALUM & MAG HYDROXIDE-SIMETH 200-200-20 MG/5ML PO SUSP: 30.0000 mL | ORAL | Status: DC | PRN

## 2011-10-30 MED ORDER — PROCHLORPERAZINE 25 MG RE SUPP
12.5000 mg | Freq: Four times a day (QID) | RECTAL | Status: DC | PRN
  Filled 2011-10-30: qty 1

## 2011-10-30 MED ORDER — DM-GUAIFENESIN ER 30-600 MG PO TB12
1.0000 | ORAL_TABLET | Freq: Two times a day (BID) | ORAL | Status: DC
Start: 2011-10-30 — End: 2011-11-07
  Administered 2011-10-30 – 2011-11-01 (×4): 1 via ORAL
  Administered 2011-11-01: 10:00:00 via ORAL
  Administered 2011-11-02 – 2011-11-07 (×11): 1 via ORAL
  Filled 2011-10-30 (×20): qty 1

## 2011-10-30 MED ORDER — IPRATROPIUM BROMIDE 0.02 % IN SOLN: 0.5000 mg | Freq: Four times a day (QID) | RESPIRATORY_TRACT | Status: DC | PRN

## 2011-10-30 MED ORDER — MOXIFLOXACIN HCL 400 MG PO TABS
400.0000 mg | ORAL_TABLET | Freq: Every day | ORAL | Status: AC
  Administered 2011-10-30 – 2011-11-04 (×6): 400 mg via ORAL
  Filled 2011-10-30 (×6): qty 1

## 2011-10-30 MED ORDER — FLUOXETINE HCL 20 MG PO CAPS
20.0000 mg | ORAL_CAPSULE | Freq: Every day | ORAL | Status: DC
  Administered 2011-10-31 – 2011-11-07 (×8): 20 mg via ORAL
  Filled 2011-10-30 (×9): qty 1

## 2011-10-30 MED ORDER — POTASSIUM CHLORIDE CRYS ER 20 MEQ PO TBCR
20.0000 meq | EXTENDED_RELEASE_TABLET | Freq: Every day | ORAL | Status: DC
  Administered 2011-10-31 – 2011-11-04 (×5): 20 meq via ORAL
  Filled 2011-10-30 (×5): qty 1

## 2011-10-30 MED ORDER — POTASSIUM CHLORIDE CRYS ER 20 MEQ PO TBCR: 20.0000 meq | EXTENDED_RELEASE_TABLET | Freq: Every day | ORAL | Status: DC

## 2011-10-30 MED ORDER — NICOTINE 14 MG/24HR TD PT24: 14.0000 mg | MEDICATED_PATCH | Freq: Every day | TRANSDERMAL | Status: DC

## 2011-10-30 MED ORDER — ENOXAPARIN SODIUM 40 MG/0.4ML ~~LOC~~ SOLN
40.0000 mg | SUBCUTANEOUS | Status: DC
  Administered 2011-10-31 – 2011-11-06 (×7): 40 mg via SUBCUTANEOUS
  Filled 2011-10-30 (×9): qty 0.4

## 2011-10-30 MED ORDER — FLEET ENEMA 7-19 GM/118ML RE ENEM
1.0000 | ENEMA | Freq: Once | RECTAL | Status: AC | PRN
  Filled 2011-10-30: qty 1

## 2011-10-30 MED ORDER — ALBUTEROL SULFATE (5 MG/ML) 0.5% IN NEBU: 2.5000 mg | INHALATION_SOLUTION | Freq: Four times a day (QID) | RESPIRATORY_TRACT | Status: DC | PRN

## 2011-10-30 MED ORDER — SENNOSIDES-DOCUSATE SODIUM 8.6-50 MG PO TABS
2.0000 | ORAL_TABLET | Freq: Two times a day (BID) | ORAL | Status: DC
  Administered 2011-10-30 – 2011-11-02 (×7): 2 via ORAL
  Filled 2011-10-30 (×9): qty 2

## 2011-10-30 MED ORDER — LEVOTHYROXINE SODIUM 137 MCG PO TABS
137.0000 ug | ORAL_TABLET | Freq: Every day | ORAL | Status: DC
  Administered 2011-10-31 – 2011-11-07 (×8): 137 ug via ORAL
  Filled 2011-10-30 (×9): qty 1

## 2011-10-30 MED ORDER — BISACODYL 10 MG RE SUPP
10.0000 mg | Freq: Every day | RECTAL | Status: DC | PRN
  Administered 2011-11-02: 10 mg via RECTAL
  Filled 2011-10-30: qty 1

## 2011-10-30 MED ORDER — MOXIFLOXACIN HCL 400 MG PO TABS
400.0000 mg | ORAL_TABLET | Freq: Every day | ORAL | Status: DC
  Filled 2011-10-30: qty 1

## 2011-10-30 MED ORDER — ACETAMINOPHEN 325 MG PO TABS: 325.0000 mg | ORAL_TABLET | ORAL | Status: DC | PRN

## 2011-10-30 MED ORDER — MORPHINE SULFATE 2 MG/ML IJ SOLN: 2.0000 mg | INTRAMUSCULAR | Status: DC | PRN

## 2011-10-30 MED ORDER — ONDANSETRON HCL 4 MG PO TABS: 4.0000 mg | ORAL_TABLET | Freq: Four times a day (QID) | ORAL | Status: DC | PRN

## 2011-10-30 MED ORDER — FUROSEMIDE 40 MG PO TABS
40.0000 mg | ORAL_TABLET | Freq: Two times a day (BID) | ORAL | Status: DC
  Filled 2011-10-30 (×2): qty 1

## 2011-10-30 NOTE — H&P (Signed)
Physical Medicine and Rehabilitation Admission H&P    Chief complaint:Weakness and leg pain HPI: [name] is a 66 y.o. female with hx of COPD (previous VDRF for hypercarbic respiratory failure in 2010), chronic thrombocytopenia, morbid obesity, and aortic stenosis who is followed by Dr. Eden Emms. She has not previously been felt to be an operative candidate due to comorbidities for her AS, previously noted as severe by notes referring to echo 03/2010. She presented to [facility] On 10/24/11 with complaints of numbness and pain in her bilateral lower extremities and in her back-constant and no relief with rest or positional change. She has been dragging her right leg and unable to stand up. She denied bowel/bladder incontinence. Evaluated by Dr. Jule Ser and MRI spine done revealing progression of severe central canal stenosis at L4-5 and moderate canal stenosis at L5-S1. Surgical decompression recommended pending cardiology work up. She was evaluated by Dr. Daleen Squibb yesterday who felt that patient was at extremely high risk due to multiple medical issues. Treated with IV diuretics for LE edema.  Had had complaints of productive cough. CXR with patchy RML infiltrate and was started on avelox 09/04 for possible PNA.  Evaluated by therapy team and CIR recommended for progression.   The patient states she has lost about 14 pounds in the last several days and feels it is likely to be fluid.  Review of Systems  Eyes: Negative for blurred vision and double vision.  Respiratory: Positive for cough, sputum production and shortness of breath. Negative for wheezing.   Cardiovascular: Positive for leg swelling (weeping edema LLE.). Negative for chest pain and palpitations.  Gastrointestinal: Positive for constipation. Negative for heartburn and nausea.  Genitourinary: Positive for urgency and frequency.  Musculoskeletal: Positive for myalgias, back pain and joint pain.  Neurological: Negative for  headaches.  Psychiatric/Behavioral: The patient has insomnia (due to constant pain--"9").    Past Medical History  Diagnosis Date  . Carotid bruit     0-39% bilateral ICA by dopplers 03/2010  . Aortic stenosis     moderate echo 01/2009, then severe 03/2010. low risk myovue 11/2007 brest attenuation EF  63%  . Venous insufficiency   . Arthritis   . Depression   . COPD (chronic obstructive pulmonary disease)   . Hypothyroidism   . Thrombocytopenia   . Hypokalemia   . Edema   . Respiratory failure     Ventilator-dependent respiratory failure secondary to hypercarbia, cardiac arrest, presumed penumonia 12/2008  . Cardiac arrest     12/2010 in setting of respiratory failure with less than 15 minutes of CPR; initial rhythm was asystole  . PVD (peripheral vascular disease)     Eval by Dr. Excell Seltzer 2012: noninvasive immaging suggested significant aortoiliac disease, ABIs in mild range  - med rx given significant comorbidities  . H/O endoscopy     For ?ampullary mass - had EGD 07/2010 with no evidence of ampullary mass, some CBD dilitation   . Chronic pain    Past Surgical History  Procedure Date  . Thyroidectomy   . Laparoscopic cholecystectomy   . Lithotripsy      for nephrolithiasis  . Vaginal hysterectomy      for menorrhagia  at age 70  . Breast cyst excision   . Rotator cuff repair   . Cesarean section   . Laminectomy    Family History  Problem Relation Age of Onset  . Emphysema Father   . Allergies Sister   . Asthma Sister   .  Heart disease Father   . Cancer Mother    Social History: Married. Independent till 2 weeks PTA. Husband has been providing total assist. Husband reports that she has been smoking E-ciggarettes. Her alcohol and drug histories not on file. Has been very sendentary with limited ambulation at home--often uses WC.  Allergies  Allergen Reactions  . Latex     REACTION: itch  . Penicillins     REACTION: hives   Medications Prior to Admission    Medication Sig Dispense Refill  . FLUoxetine (PROZAC) 20 MG capsule Take 20 mg by mouth daily.        Marland Kitchen levothyroxine (SYNTHROID, LEVOTHROID) 137 MCG tablet Take 137 mcg by mouth daily.      . metoprolol succinate (TOPROL-XL) 25 MG 24 hr tablet Take 25 mg by mouth daily.      Marland Kitchen morphine (MS CONTIN) 60 MG 12 hr tablet Take 120 mg by mouth 2 (two) times daily.      Marland Kitchen morphine (MSIR) 15 MG tablet Take 15 mg by mouth every 4 (four) hours as needed. pain      . potassium chloride (KLOR-CON) 10 MEQ CR tablet Take 20 mEq by mouth daily. 2 tabs po qam and 1 po qhs        Home:     Functional History:    Functional Status:  Mobility:          ADL:    Cognition:       Blood pressure 138/62, pulse 80, temperature 98.3 F (36.8 C), temperature source Oral, resp. rate 18, weight 114.4 kg (252 lb 3.3 oz), SpO2 100.00%. Physical Exam  Nursing note and vitals reviewed. Constitutional: She is oriented to person, place, and time. She appears well-developed and well-nourished.       Morbidly obese. Sedated appearing-eyes at half mast frequently   HENT:  Head: Normocephalic and atraumatic.  Eyes: Pupils are equal, round, and reactive to light.  Neck: Normal range of motion. Neck supple.  Cardiovascular: Normal rate and regular rhythm.  Exam reveals no S3, no S4 and no friction rub.   Murmur heard.  Systolic murmur is present with a grade of 4/6  Pulmonary/Chest: Effort normal. She has wheezes.  Abdominal: Bowel sounds are normal. She exhibits no distension. There is no tenderness.       obese  Musculoskeletal: She exhibits edema.       Woody edema BLE.  kerlix on LLE damp.  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   Neurological: She is oriented to person, place, and time.       Follows commands without difficulty.   Skin: Skin is warm and dry.       Stasis changes BLE. She has more erythema in the right lower extremity than on left side  Sensation is reduced in bilateral feet to light  touch. Motor strength is 5/5 in bilateral deltoid, biceps, triceps, grip 4/5 in bilateral knee extensors 3 minus in hip flexors 2 minus in ankle dorsiflexors and plantar flexors  Results for orders placed during the hospital encounter of 10/24/11 (from the past 48 hour(s))  BASIC METABOLIC PANEL     Status: Abnormal   Collection Time   10/29/11  5:50 AM      Component Value Range Comment   Sodium 141  135 - 145 mEq/L    Potassium 3.8  3.5 - 5.1 mEq/L    Chloride 100  96 - 112 mEq/L    CO2 36 (*) 19 -  32 mEq/L    Glucose, Bld 171 (*) 70 - 99 mg/dL    BUN 16  6 - 23 mg/dL    Creatinine, Ser 6.29  0.50 - 1.10 mg/dL    Calcium 9.2  8.4 - 52.8 mg/dL    GFR calc non Af Amer 87 (*) >90 mL/min    GFR calc Af Amer >90  >90 mL/min   CBC     Status: Abnormal   Collection Time   10/29/11  5:50 AM      Component Value Range Comment   WBC 6.5  4.0 - 10.5 K/uL    RBC 4.47  3.87 - 5.11 MIL/uL    Hemoglobin 12.8  12.0 - 15.0 g/dL    HCT 41.3  24.4 - 01.0 %    MCV 90.2  78.0 - 100.0 fL    MCH 28.6  26.0 - 34.0 pg    MCHC 31.8  30.0 - 36.0 g/dL    RDW 27.2  53.6 - 64.4 %    Platelets 112 (*) 150 - 400 K/uL CONSISTENT WITH PREVIOUS RESULT   No results found.  Post Admission Physician Evaluation: 1. Functional deficits secondary  to Lumbar spinal stenosis non-operative do to severe aortic stenosis. 2. Patient is admitted to receive collaborative, interdisciplinary care between the physiatrist, rehab nursing staff, and therapy team. 3. Patient's level of medical complexity and substantial therapy needs in context of that medical necessity cannot be provided at a lesser intensity of care such as a SNF. 4. Patient has experienced substantial functional loss from his/her baseline which was documented above under the "Functional History" and "Functional Status" headings.  Judging by the patient's diagnosis, physical exam, and functional history, the patient has potential for functional progress which will  result in measurable gains while on inpatient rehab.  These gains will be of substantial and practical use upon discharge  in facilitating mobility and self-care at the household level. 5. Physiatrist will provide 24 hour management of medical needs as well as oversight of the therapy plan/treatment and provide guidance as appropriate regarding the interaction of the two. 6. 24 hour rehab nursing will assist with bladder management, bowel management, safety, skin/wound care, disease management, medication administration and patient education  and help integrate therapy concepts, techniques,education, etc. 7. PT will assess and treat for:  Pre-gait training, gait training, safety, equipment, endurance, activity tolerance.  Goals are: Supervision for mobility. 8. OT will assess and treat for: ADLs, safety, equipment, endurance, activity tolerance.   Goals are: Supervision to modified independent level with ADL. 9. SLP will assess and treat for: Not applicable.  Goals are: Not applicable. 10. Case Management and Social Worker will assess and treat for psychological issues and discharge planning. 11. Team conference will be held weekly to assess progress toward goals and to determine barriers to discharge. 12. Patient will receive at least 3 hours of therapy per day at least 5 days per week. 13. ELOS: 7-10 days      Prognosis:  good   Medical Problem List and Plan: 1. DVT Prophylaxis/Anticoagulation: Pharmaceutical: Lovenox 2. Pain Management: continue home dose MS contin 120 mg bid with MSIR 15 mg q 4 hrs prn. 3. Mood: continue prozac for chronic depression.  4. Neuropsych: This patient is capable of making decisions on his/her own behalf. 5. Morbid obesity:  Pressure relief measures.  Elevate BLE when in bed/chair due to severe peripheral edema. 6. Chronic CHF: Monitor daily weights. Low salt diet.  Will have dietitian  educate patient on low salt diet (two bags of lite chips in front of patient).   Continue Lasix 40 mg bid (changed to po) and metoprolol 25 mg daily.  7.  COPD: ongoing tobacco use.   8. PNA?:  avelox D#2. Will add nebs to help with symtoms.  Follow up CXR in am.  10/30/2011, 5:06 PM

## 2011-10-30 NOTE — Discharge Summary (Deleted)
Addendum: Pt accepted by insurance for inpatient rehab.  Will transfer today.  Vitals stable.  Doing well.  No changes since summary dictated yesterday,

## 2011-10-30 NOTE — Progress Notes (Signed)
Pt arrived to unit around 1645. Family at bedside. Educated family on rehab process and reviewed packet/booklet and safety plan. Pt signed plan. No further questions at this time.

## 2011-10-30 NOTE — Progress Notes (Signed)
Received approval from [facility] for admission to CIR. Met with pt and called her husband to inform them. They are in agreement with plan to transfer to inpatient rehab today. Call with questions: [phone].

## 2011-10-30 NOTE — PMR Pre-admission (Signed)
PMR Admission Coordinator Pre-Admission Assessment  Patient: [name] is an 66 y.o., female MRN: [id number] DOB: 05-Feb-1946 Height: 5' (152.4 cm) Weight: 116.03 kg (255 lb 12.8 oz)  Insurance Information HMO: yes     PRIMARY: Eston Mould      Policy#: [id number]      Subscriber: self CM Name: Carollee Herter      Pre-Cert#: [id number]        Employer: Retired Benefits:  Phone #: [phone]     Eff. Date: 08/25/11   Deduct: 0   Out of Pocket Max: $3900  Life Max: none CIR: $220 Day 1-7 / $0 Day 8-90  SNF: $50 Day 1-20 / $150 Day 21-40 /  $0 Day 41-100  (100 day max) Outpatient: No limits     Co-Pay: $40  Home Health: 80%      Co-Pay:20% DME: 80%     Co-Pay: 20% Providers:Patient's choice  Emergency Contact Information Contact Information    Name Relation Home Work Mobile   Evangelist,Harvey Spouse [phone]  [phone]     Current Medical History  Patient Admitting Diagnosis: Lumbar stenosis with myelopathy, OA of the knees, peripheral neuropathy, multi-factorial gait disorder.  History of Present Illness:66 y.o. female with hx of COPD (previous VDRF for hypercarbic respiratory failure in 2010), chronic thrombocytopenia, morbid obesity, and aortic stenosis who is followed by Dr. Eden Emms. She has not previously been felt to be an operative candidate due to comorbidities for her AS, previously noted as severe by notes referring to echo 03/2010. She presented to [facility] On 10/24/11 with complaints of numbness and pain in her bilateral lower extremities and in her back-constant and no relief with rest or positional change. She has been dragging her right leg and unable to stand up. She denied bowel/bladder incontinence. Evaluated by Dr. Jule Ser and MRI spine done revealing progression of severe central canal stenosis at L4-5 and moderate canal stenosis at L5-S1. Surgical decompression recommended pending cardiology work up. She was evaluated by Dr. Daleen Squibb yesterday who felt that  patient was at extremely high risk due to multiple medical issues. PT evaluation was done on 10/25/11 and patient was noted to be lethargic, confused and had difficulty advancing BLE.    Past Medical History  Past Medical History  Diagnosis Date  . Carotid bruit     0-39% bilateral ICA by dopplers 03/2010  . Aortic stenosis     moderate echo 01/2009, then severe 03/2010. low risk myovue 11/2007 brest attenuation EF  63%  . Venous insufficiency   . Arthritis   . Depression   . COPD (chronic obstructive pulmonary disease)   . Hypothyroidism   . Thrombocytopenia   . Hypokalemia   . Edema   . Respiratory failure     Ventilator-dependent respiratory failure secondary to hypercarbia, cardiac arrest, presumed penumonia 12/2008  . Cardiac arrest     12/2010 in setting of respiratory failure with less than 15 minutes of CPR; initial rhythm was asystole  . PVD (peripheral vascular disease)     Eval by Dr. Excell Seltzer 2012: noninvasive immaging suggested significant aortoiliac disease, ABIs in mild range  - med rx given significant comorbidities  . H/O endoscopy     For ?ampullary mass - had EGD 07/2010 with no evidence of ampullary mass, some CBD dilitation     Family History  Family history includes Allergies in her sister; Asthma in her sister; Cancer in her mother; Emphysema in her father; and Heart disease in her father.  Prior  Rehab/Hospitalizations: none   Current Medications  Current facility-administered medications:acetaminophen (TYLENOL) suppository 650 mg, 650 mg, Rectal, Q6H PRN, Gery Pray, MD;  acetaminophen (TYLENOL) tablet 650 mg, 650 mg, Oral, Q6H PRN, Gery Pray, MD;  alum & mag hydroxide-simeth (MAALOX/MYLANTA) 200-200-20 MG/5ML suspension 30 mL, 30 mL, Oral, Q6H PRN, Debby Crosley, MD;  docusate sodium (COLACE) capsule 100 mg, 100 mg, Oral, BID, Henderson Cloud, MD, 100 mg at 10/30/11 0948 enoxaparin (LOVENOX) injection 60 mg, 60 mg, Subcutaneous, Q24H, Debby  Crosley, MD, 60 mg at 10/29/11 1452;  FLUoxetine (PROZAC) capsule 20 mg, 20 mg, Oral, Daily, Debby Crosley, MD, 20 mg at 10/30/11 0948;  furosemide (LASIX) injection 40 mg, 40 mg, Intravenous, BID, Laurey Morale, MD, 40 mg at 10/30/11 0945;  levothyroxine (SYNTHROID, LEVOTHROID) tablet 137 mcg, 137 mcg, Oral, Q0600, Gery Pray, MD, 137 mcg at 10/30/11 0545 metoprolol succinate (TOPROL-XL) 24 hr tablet 25 mg, 25 mg, Oral, Daily, Debby Crosley, MD, 25 mg at 10/30/11 0948;  morphine (MS CONTIN) 12 hr tablet 120 mg, 120 mg, Oral, Q12H, Henderson Cloud, MD, 120 mg at 10/30/11 0545;  morphine (MSIR) tablet 15 mg, 15 mg, Oral, Q4H PRN, Gery Pray, MD, 15 mg at 10/29/11 0806;  morphine 2 MG/ML injection 2 mg, 2 mg, Intravenous, Q4H PRN, Debby Crosley, MD moxifloxacin (AVELOX) tablet 400 mg, 400 mg, Oral, Q2000, Hollice Espy, MD, 400 mg at 10/29/11 2050;  nicotine (NICODERM CQ - dosed in mg/24 hours) patch 14 mg, 14 mg, Transdermal, Daily, Caroline More, NP, 14 mg at 10/30/11 1000;  ondansetron (ZOFRAN) injection 4 mg, 4 mg, Intravenous, Q6H PRN, Debby Crosley, MD;  ondansetron (ZOFRAN) tablet 4 mg, 4 mg, Oral, Q6H PRN, Debby Crosley, MD potassium chloride SA (K-DUR,KLOR-CON) CR tablet 40 mEq, 40 mEq, Oral, Daily, Laurey Morale, MD, 40 mEq at 10/30/11 0948;  senna-docusate (Senokot-S) tablet 3 tablet, 3 tablet, Oral, QHS, Henderson Cloud, MD, 3 tablet at 10/29/11 2049  Patients Current Diet: Cardiac  Precautions / Restrictions Precautions Precautions: Fall Restrictions Weight Bearing Restrictions: No  Prior Activity Level Limited Community (1-2x/wk): 1-2x/month Home Assistive Devices / Equipment Home Assistive Devices/Equipment: Cane (specify quad or straight);Oxygen;Walker (specify type);Wheelchair Home Adaptive Equipment: Bedside commode/3-in-1;Walker - four wheeled;Wheelchair - manual Prior Functional Level Prior Function Level of Independence: Needs assistance Needs  Assistance: Bathing;Dressing;Grooming;Toileting;Meal Prep;Light Housekeeping;Gait;Transfers Meal Prep: Total Light Housekeeping: Total Gait Assistance: assist with RW and most recently, push in w/c Able to Take Stairs?: No Driving: No Vocation: Retired Current Functional Level Cognition  Arousal/Alertness: Awake/alert Overall Cognitive Status: Appears within functional limits for tasks assessed/performed Current Attention Level: Selective Attention - Other Comments: attn appeared better today. Orientation Level: Oriented X4 Following Commands: Follows one step commands consistently    Extremity Assessment (includes Sensation/Coordination)  RUE ROM/Strength/Tone: Within functional levels  RLE ROM/Strength/Tone: Deficits RLE ROM/Strength/Tone Deficits: hip flex 2-/5, knee ext2-/5, knee flex 2-/5 RLE Sensation: Deficits RLE Sensation Deficits: cellulitis RLE>LLE RLE Coordination: Deficits RLE Coordination Deficits: decreased coordination of mvmt with stepping R>L    ADLs  Eating/Feeding: Performed;Independent Where Assessed - Eating/Feeding: Edge of bed Grooming: Performed;Wash/dry hands;Denture care;Brushing hair;Supervision/safety Where Assessed - Grooming: Supported standing Upper Body Bathing: Simulated;Set up Where Assessed - Upper Body Bathing: Unsupported sitting Lower Body Bathing: Simulated;Moderate assistance Where Assessed - Lower Body Bathing: Unsupported sit to stand Upper Body Dressing: Simulated;Set up Where Assessed - Upper Body Dressing: Unsupported sitting Lower Body Dressing: Performed;Minimal assistance Where Assessed - Lower Body Dressing: Unsupported sitting Toilet  Transfer: Research scientist (life sciences) Method: Sit to Barista: Regular height toilet;Grab bars Toileting - Architect and Hygiene: Performed;Supervision/safety Where Assessed - Engineer, mining and Hygiene: Sit on 3-in-1 or  toilet;Standing Equipment Used: Rolling walker Transfers/Ambulation Related to ADLs: Pt stood/ambulated to bathroom and sink for total of 18 minutes today all with S. This was increase in activity tolerance. ADL Comments: Pt required assist with LE dressing during toileting.    Mobility  Bed Mobility: Sit to Supine Supine to Sit: 4: Min assist Sitting - Scoot to Edge of Bed: 5: Supervision Sit to Supine: 4: Min assist;HOB flat (assist to get LE into bed.) Sit to Supine: Patient Percentage: 40% Sit to Sidelying Left: 2: Max assist Sit to Sidelying Left: Patient Percentage: 40%    Transfers  Transfers: Sit to Stand;Stand to Sit Sit to Stand: 5: Supervision;With armrests;From chair/3-in-1 Sit to Stand: Patient Percentage: 70% Stand to Sit: 5: Supervision;To bed Stand to Sit: Patient Percentage: 80% Stand Pivot Transfers: 1: +2 Total assist (pt 70%) Stand Pivot Transfers: Patient Percentage: 70%    Ambulation / Gait / Stairs / Wheelchair Mobility  Ambulation/Gait Ambulation/Gait Assistance: 4: Min Environmental consultant (Feet): 15 Feet Assistive device: Rolling walker Ambulation/Gait Assistance Details: Assist for balance today due to fatigue with distance greatly limited.  Cues for tall posture and safety inside RW with tendency to travel outside RW keeping it far from self. Gait Pattern: Step-through pattern;Decreased stride length;Trunk flexed Stairs: No Wheelchair Mobility Wheelchair Mobility: No    Posture / Balance Dynamic Standing Balance Dynamic Standing - Balance Support: Bilateral upper extremity supported;During functional activity Dynamic Standing - Level of Assistance: 5: Stand by assistance     Previous Home Environment Living Arrangements: Spouse/significant other Lives With: Spouse Available Help at Discharge: Available 24 hours/day Type of Home: House Home Layout: One level Home Access: Stairs to enter Secretary/administrator of Steps: 3 Bathroom  Shower/Tub: Engineer, manufacturing systems: Standard Home Care Services: No Additional Comments: pt's husband has been using a makeshift ramp to get pt ina nd out of house in w/c but is working on having a real ramp built.    Discharge Living Setting Plans for Discharge Living Setting: Patient's home Type of Home at Discharge: House Discharge Home Layout: One level Discharge Home Access: Stairs to enter Entrance Stairs-Rails:one rail Entrance Stairs-Number of Steps: 3-5 / building a ramp Do you have any problems obtaining your medications?: No  Social/Family/Support Systems Patient Roles: Spouse;Parent Contact Information: Jasleen Riepe [phone] Anticipated Caregiver: Husband Anticipated Caregiver's Contact Information: [phone] Ability/Limitations of Caregiver: none Caregiver Availability: 24/7 Discharge Plan Discussed with Primary Caregiver: Yes Is Caregiver In Agreement with Plan?: Yes Does Caregiver/Family have Issues with Lodging/Transportation while Pt is in Rehab?: No  Goals/Additional Needs Patient/Family Goal for Rehab: PT&OT mod I Expected length of stay: 2 weeks Cultural Considerations: none Dietary Needs: Heart healthy Equipment Needs: TBD Pt/Family Agrees to Admission and willing to participate: Yes Program Orientation Provided & Reviewed with Pt/Caregiver Including Roles  & Responsibilities: Yes  Patient Condition: Please see physician update to information in consult dated 10/28/11.  Preadmission Screen Completed By:  Meryl Dare, 10/30/2011 12:46 PM ______________________________________________________________________   Discussed status with Dr. Wynn Banker on 10/30/11 at 1:10 PM and received telephone approval for admission today.  Admission Coordinator:  Meryl Dare, time1:10PM/Date9/5/13

## 2011-10-30 NOTE — Progress Notes (Signed)
Patient ID: [name], female   DOB: 11-Jun-1945, 66 y.o.   MRN: [id number]   Patient Name: [name] Date of Encounter: 10/30/2011  Principal Problem:  *Lower extremity weakness Active Problems:  HYPOTHYROIDISM  HYPOKALEMIA  THROMBOCYTOPENIA  DEPRESSION  AORTIC STENOSIS  COPD  PVD (peripheral vascular disease)  Spinal stenosis of lumbar region  Chronic diastolic CHF (congestive heart failure)  Morbid obesity   SUBJECTIVE  No chest pain.  .  Biggest complaint remains leg pain/wkns and lower back pain.  CURRENT MEDS    . docusate sodium  100 mg Oral BID  . enoxaparin (LOVENOX) injection  60 mg Subcutaneous Q24H  . FLUoxetine  20 mg Oral Daily  . furosemide  40 mg Intravenous BID  . levothyroxine  137 mcg Oral Q0600  . metoprolol succinate  25 mg Oral Daily  . morphine  120 mg Oral Q12H  . moxifloxacin  400 mg Oral Q2000  . nicotine  14 mg Transdermal Daily  . potassium chloride  40 mEq Oral Daily  . senna-docusate  3 tablet Oral QHS   OBJECTIVE  Filed Vitals:   10/29/11 1040 10/29/11 1426 10/29/11 2052 10/30/11 0439  BP:  139/64 100/43 127/55  Pulse:  73 76 80  Temp:  98.1 F (36.7 C) 98.6 F (37 C) 98.2 F (36.8 C)  TempSrc:  Oral  Oral  Resp:  19 18 20   Height:      Weight:      SpO2: 93% 96% 100% 100%    Intake/Output Summary (Last 24 hours) at 10/30/11 0806 Last data filed at 10/29/11 1300  Gross per 24 hour  Intake    600 ml  Output      0 ml  Net    600 ml   Filed Weights   10/25/11 0240 10/28/11 0500 10/29/11 0500  Weight: 266 lb 11.2 oz (120.974 kg) 266 lb (120.657 kg) 255 lb 12.8 oz (116.03 kg)   PHYSICAL EXAM  General: Pleasant, NAD. Neuro: Alert and oriented X 3. Moves all extremities spontaneously. Psych: Normal affect. HEENT:  Normal  Neck: Supple, obese, difficult to assess JVD.  Bilat bruits vs radiated murmur. Lungs:  Resp regular and unlabored, CTA. Heart: RRR no s3, s4.  3/6 SEM loudest @ bilat usb, radiating to neck  bilat. Abdomen: Soft, non-tender, non-distended, BS + x 4.  Extremities: No clubbing, cyanosis. 2+ chronic edema to knees bilaterally.  DP/PT/Radials 2+ and equal bilaterally. Erythema RLE   Accessory Clinical Findings  CBC  Basename 10/29/11 0550  WBC 6.5  NEUTROABS --  HGB 12.8  HCT 40.3  MCV 90.2  PLT 112*   Basic Metabolic Panel  Basename 10/29/11 0550  NA 141  K 3.8  CL 100  CO2 36*  GLUCOSE 171*  BUN 16  CREATININE 0.75  CALCIUM 9.2  MG --  PHOS --   TELE  RSR/ST  ASSESSMENT AND PLAN  1.  Lower Extremity Weakness & Spinal Stenosis:  Unfortunately, secondary to multiple co-morbidities, pt is a poor surgical candidate.  She is currently being evaluated by PT/OT and being considered for short-term acute rehab.  2.  Severe Ao Stenosis:  Not an operative or TAVR candidate    3.  Chronic Diast CHF:  Would send to rehab on lasix 40 bid not 80 daily   4.  COPD/Tob Abuse:  No active wheezing.  Nicotine patch in place.  Smoking cessation advised.  Will sign off  Signed, Charlton Haws NP

## 2011-10-30 NOTE — Progress Notes (Signed)
Pt transferred to 4100, report called to [facility]

## 2011-10-30 NOTE — Discharge Summary (Signed)
Physician Discharge Summary  [name] WUJ:[id number] DOB: [birth year] DOA: 10/24/2011  PCP: [name], MD  Admit date: 10/24/2011 Discharge date: 10/30/2011  Recommendations for Outpatient Follow-up:  1. Patient is being discharged to inpatient rehabilitation versus short-term skilled nursing  Discharge Diagnoses:  Principal Problem:  *Lower extremity weakness Active Problems:  HYPOTHYROIDISM  HYPOKALEMIA  THROMBOCYTOPENIA  DEPRESSION  AORTIC STENOSIS  COPD  PVD (peripheral vascular disease)  Spinal stenosis of lumbar region  Chronic diastolic CHF (congestive heart failure)  Morbid obesity   Discharge Condition: Stable, mild improvement, discharge to either inpatient rehabilitation versus skilled nursing facility  Diet recommendation: Heart healthy  Filed Weights   10/25/11 0240 10/28/11 0500 10/29/11 0500  Weight: 120.974 kg (266 lb 11.2 oz) 120.657 kg (266 lb) 116.03 kg (255 lb 12.8 oz)    History of present illness:  Patient is a 66 year old white female past medical history COPD and severe aortic stenosis who presented with complaints of bilateral numbness and pain in her lower extremities making it difficult to stand or walk and found to have moderate to severe spinal stenosis. Surgical decompression was recommended however she was felt to be an extremely high risk candidate due to her AS.   Hospital Course:  Bilateral lower extremity weakness: Felt to be multifactorial including spinal stenosis, obesity, mild PVD, arthritis and chronic edema. Neurosurgery recommended surgery for spinal stenosis but cardiology feels the patient to be a very poor surgical candidate given her aortic stenosis and COPD. Inpatient rehabilitation accepted the patient and she was approved for transfer today.  Aortic stenosis/chronic diastolic CHF: Echo noted grade 2 diastolic dysfunction plus severe AS with a valve area of 0.94. Currently stable. Patient has chronic dyspnea on  exertion.  Cardiology recommended changing her home Lasix dose from 80 mg daily to 40 mg by mouth twice a day  COPD: Stable  Thrombocytopenia: Chronic followed by hematology but the cat has remained stable for the past 2 years.  Pneumonia: Chest x-ray done because of increased sputum production.  This noted mild infiltrate. Patient started on by mouth Avelox.  Procedures:  Echocardiogram noted grade 2 diastolic dysfunction plus severe aortic stenosis without area 0.94  Consultations:  Nishan-Cardiology  Swartz-PMR  Nudelman-Neurosurgery  Discharge Exam: Filed Vitals:   10/30/11 1055  BP:   Pulse: 83  Temp:   Resp:    Filed Vitals:   10/29/11 1426 10/29/11 2052 10/30/11 0439 10/30/11 1055  BP: 139/64 100/43 127/55   Pulse: 73 76 80 83  Temp: 98.1 F (36.7 C) 98.6 F (37 C) 98.2 F (36.8 C)   TempSrc: Oral  Oral   Resp: 19 18 20    Height:      Weight:      SpO2: 96% 100% 100% 91%    General: Alert and oriented x3, in no acute distress, fatigue, looks older than stated age HEENT: Normocephalic and atraumatic, mucous members are slightly dry Cardiovascular: Regular rate and rhythm, S1-S2, 3/6 holosystolic murmur Respiratory: Decreased breath sounds at bases Abdomen: Soft, obese, nontender, positive bowel sounds Extremities: 1-2+ pitting edema. Right posterior may show signs of chronic venous stasis. No signs of acute cellulitis. No change from yesterday  Discharge Instructions   Medication List  As of 10/30/2011 12:55 PM   STOP taking these medications         furosemide 80 MG tablet         TAKE these medications         FLUoxetine 20 MG capsule  Commonly known as: PROZAC   Take 20 mg by mouth daily.      levothyroxine 137 MCG tablet   Commonly known as: SYNTHROID, LEVOTHROID   Take 137 mcg by mouth daily.      metoprolol succinate 25 MG 24 hr tablet   Commonly known as: TOPROL-XL   Take 25 mg by mouth daily.      morphine 15 MG tablet    Commonly known as: MSIR   Take 15 mg by mouth every 4 (four) hours as needed. pain      morphine 60 MG 12 hr tablet   Commonly known as: MS CONTIN   Take 120 mg by mouth 2 (two) times daily.      potassium chloride 10 MEQ CR tablet   Commonly known as: KLOR-CON   Take 20 mEq by mouth daily. 2 tabs po qam and 1 po qhs    Lasix 40mg   1 tablet twice a day       Follow-up Information    Follow up with Charlton Haws, MD. Schedule an appointment as soon as possible for a visit in 1 month.   Contact information:   [address] [place] [address] [phone]       Follow up with [name], MD in 1 month.   Contact information:   [address] [place] [address] [phone]           The results of significant diagnostics from this hospitalization (including imaging, microbiology, ancillary and laboratory) are listed below for reference.    Significant Diagnostic Studies: Dg Chest 2 View 10/28/2011  IMPRESSION: 1.  Small patchy infiltrate in the right middle lobe. 2.  Bronchitic changes.   Original Report Authenticated By: Gwynn Burly, M.D.    Mr Lumbar Spine Wo Contrast 10/24/2011  IMPRESSION:  1.  Progression of severe central canal stenosis at L4-5. 2.  Progression of moderate right and mild left foraminal stenosis at L5-S1. 3.  Mild foraminal narrowing bilaterally at L4-5 is stable. 4.  Progression of mild central canal narrowing at L3-4 with left greater than right lateral recess narrowing. 5.  Progression of left lateral recess narrowing at L2-3.  6.  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   Original Report Authenticated By: Jamesetta Orleans. MATTERN, M.D.    Dg Knee Complete 4 Views Left 10/24/2011   IMPRESSION: Obesity.  Osteopenic appearance of bones.  Narrowing of medial joint space.  Patellar spurring.  Question small joint effusion.   Original Report  Authenticated By: Crawford Givens, M.D.      Labs: Basic Metabolic Panel:  Lab 10/29/11 5284 10/27/11 0558 10/25/11 0500 10/24/11 1645  NA 141 139 140 140  K 3.8 3.8 4.5 4.0  CL 100 98 104 103  CO2 36* 35* 29 32  GLUCOSE 171* 126* 117* 103*  BUN 16 15 17 20   CREATININE 0.75 0.69 0.70 0.69  CALCIUM 9.2 9.1 8.8 8.8  MG -- -- -- --  PHOS -- -- -- --   Liver Function Tests:  Lab 10/24/11 1645  AST 31  ALT 19  ALKPHOS 128*  BILITOT 0.6  PROT 6.3  ALBUMIN 3.1*   CBC:  Lab 10/29/11 0550 10/27/11 0558 10/25/11 0500 10/24/11 1645  WBC 6.5 6.0 6.0 6.2  NEUTROABS -- -- -- 4.3  HGB 12.8 12.6 12.0 12.0  HCT 40.3 40.0 37.4 37.0  MCV 90.2 89.7 90.3 89.4  PLT 112*  107* 90* 93*    Time coordinating discharge: 40 minutes  Signed:  Hollice Espy  [facility] 10/30/2011, 12:55 PM

## 2011-10-30 NOTE — Progress Notes (Signed)
Occupational Therapy Treatment Patient Details Name: [name] MRN: [id number] DOB: [birth year] Today's Date: 10/30/2011 Time: 1308-6578 OT Time Calculation (min): 30 min  OT Assessment / Plan / Recommendation Comments on Treatment Session Pt with increased I with adls although still is limited with what she can tolerate due to multiple comorbidities.    Follow Up Recommendations  Inpatient Rehab;Skilled nursing facility    Barriers to Discharge       Equipment Recommendations  Defer to next venue    Recommendations for Other Services    Frequency Min 2X/week   Plan Discharge plan needs to be updated    Precautions / Restrictions Precautions Precautions: Fall Restrictions Weight Bearing Restrictions: No   Pertinent Vitals/Pain Pt with 4/10 back pain.  O2 sats are 91% w/o O2    ADL  Eating/Feeding: Performed;Independent Where Assessed - Eating/Feeding: Edge of bed Grooming: Performed;Wash/dry hands;Denture care;Brushing hair;Supervision/safety Where Assessed - Grooming: Supported standing Upper Body Bathing: Simulated;Set up Where Assessed - Upper Body Bathing: Unsupported sitting Lower Body Bathing: Simulated;Moderate assistance Where Assessed - Lower Body Bathing: Unsupported sit to stand Upper Body Dressing: Simulated;Set up Where Assessed - Upper Body Dressing: Unsupported sitting Lower Body Dressing: Performed;Minimal assistance Where Assessed - Lower Body Dressing: Unsupported sitting Toilet Transfer: Performed;Supervision/safety Toilet Transfer Method: Sit to stand Toilet Transfer Equipment: Regular height toilet;Grab bars Toileting - Clothing Manipulation and Hygiene: Performed;Supervision/safety Where Assessed - Engineer, mining and Hygiene: Sit on 3-in-1 or toilet;Standing Equipment Used: Rolling walker Transfers/Ambulation Related to ADLs: Pt stood/ambulated to bathroom and sink for total of 18 minutes today all with S. This was increase in  activity tolerance. ADL Comments: Pt required assist with LE dressing during toileting.    OT Diagnosis:    OT Problem List:   OT Treatment Interventions:     OT Goals Acute Rehab OT Goals OT Goal Formulation: With patient/family Time For Goal Achievement: 11/10/11 Potential to Achieve Goals: Good ADL Goals Pt Will Perform Grooming: with modified independence;Standing at sink;Supported ADL Goal: Grooming - Progress: Progressing toward goals Pt Will Perform Upper Body Bathing: Standing at sink;with set-up Pt Will Perform Lower Body Bathing: with min assist;with adaptive equipment;Sit to stand in shower Pt Will Perform Upper Body Dressing: with set-up;Sitting, chair Pt Will Perform Lower Body Dressing: Sit to stand from chair;Supported;with modified independence ADL Goal: Lower Body Dressing - Progress: Progressing toward goals Pt Will Transfer to Toilet: with modified independence;Regular height toilet;Grab bars ADL Goal: Toilet Transfer - Progress: Progressing toward goals Pt Will Perform Toileting - Clothing Manipulation: with modified independence;Standing;Sitting on 3-in-1 or toilet ADL Goal: Toileting - Clothing Manipulation - Progress: Progressing toward goals Pt Will Perform Toileting - Hygiene: Independently ADL Goal: Toileting - Hygiene - Progress: Progressing toward goals  Visit Information  Last OT Received On: 10/30/11 Assistance Needed: +1    Subjective Data      Prior Functioning       Cognition  Overall Cognitive Status: Appears within functional limits for tasks assessed/performed Area of Impairment: Attention;Following commands Arousal/Alertness: Awake/alert Orientation Level: Appears intact for tasks assessed Behavior During Session: [facility] for tasks performed Current Attention Level: Selective Attention - Other Comments: attn appeared better today. Following Commands: Follows one step commands consistently    Mobility  Shoulder Instructions Bed  Mobility Bed Mobility: Sit to Supine Sit to Supine: 4: Min assist;HOB flat (assist to get LE into bed.) Details for Bed Mobility Assistance: Pt with increased bed mobility today but still requires assist with getting LE into the  bed. Transfers Transfers: Sit to Stand;Stand to Sit Sit to Stand: 5: Supervision;With armrests;From chair/3-in-1 Stand to Sit: 5: Supervision;To bed Details for Transfer Assistance: Pt overall S today.       Exercises      Balance Balance Balance Assessed: No   End of Session OT - End of Session Activity Tolerance: Patient tolerated treatment well Patient left: in bed;with call bell/phone within reach;with family/visitor present Nurse Communication: Mobility status  GO     Hope Budds 10/30/2011, 11:06 AM [phone]

## 2011-10-30 NOTE — Progress Notes (Signed)
CSW received insurance authorization for [facility] Inpatient rehab for pt. Pt plans to dc to [facility]patient [facility]. No further csw needs, sign off.   Catha Gosselin, Theresia Majors  [phone] .10/30/2011 12:05pm

## 2011-10-31 ENCOUNTER — Inpatient Hospital Stay (HOSPITAL_COMMUNITY): Payer: Medicare Other

## 2011-10-31 ENCOUNTER — Inpatient Hospital Stay (HOSPITAL_COMMUNITY): Payer: Medicare Other | Admitting: Physical Therapy

## 2011-10-31 ENCOUNTER — Inpatient Hospital Stay (HOSPITAL_COMMUNITY): Payer: Medicare Other | Admitting: Occupational Therapy

## 2011-10-31 DIAGNOSIS — Z5189 Encounter for other specified aftercare: Secondary | ICD-10-CM

## 2011-10-31 DIAGNOSIS — M48061 Spinal stenosis, lumbar region without neurogenic claudication: Secondary | ICD-10-CM

## 2011-10-31 LAB — CBC WITH DIFFERENTIAL/PLATELET
Basophils Relative: 0 % (ref 0–1)
Hemoglobin: 12.1 g/dL (ref 12.0–15.0)
Lymphs Abs: 1.1 10*3/uL (ref 0.7–4.0)
Monocytes Relative: 9 % (ref 3–12)
Neutro Abs: 7.3 10*3/uL (ref 1.7–7.7)
Neutrophils Relative %: 78 % — ABNORMAL HIGH (ref 43–77)
Platelets: 98 10*3/uL — ABNORMAL LOW (ref 150–400)
RBC: 4.21 MIL/uL (ref 3.87–5.11)
WBC: 9.4 10*3/uL (ref 4.0–10.5)

## 2011-10-31 LAB — COMPREHENSIVE METABOLIC PANEL
ALT: 27 U/L (ref 0–35)
Albumin: 3 g/dL — ABNORMAL LOW (ref 3.5–5.2)
Alkaline Phosphatase: 178 U/L — ABNORMAL HIGH (ref 39–117)
BUN: 19 mg/dL (ref 6–23)
Chloride: 99 mEq/L (ref 96–112)
Glucose, Bld: 129 mg/dL — ABNORMAL HIGH (ref 70–99)
Potassium: 3.9 mEq/L (ref 3.5–5.1)
Sodium: 140 mEq/L (ref 135–145)
Total Bilirubin: 0.6 mg/dL (ref 0.3–1.2)

## 2011-10-31 MED ORDER — POLYETHYLENE GLYCOL 3350 17 G PO PACK
17.0000 g | PACK | Freq: Every day | ORAL | Status: DC | PRN
  Administered 2011-10-31: 17 g via ORAL
  Filled 2011-10-31 (×3): qty 1

## 2011-10-31 NOTE — Progress Notes (Signed)
Patient information reviewed and entered into [facility] system by Oluwadamilare Tobler, RN, CRRN, PPS Coordinator.  Information including medical coding and functional independence measure will be reviewed and updated through discharge.     Per nursing patient was given "Data Collection Information Summary for Patients in Inpatient Rehabilitation Facilities with attached "Privacy Act Statement-Health Care Records" upon admission.   

## 2011-10-31 NOTE — Progress Notes (Signed)
Nutrition Education Note  RD consulted for nutrition education regarding a Low Sodium diet. Pt stated that she has lost weight without trying according to her Admission Nutrition screen. Pt denies that she stated this and she is currently eating well.  RD provided Low Sodium handout with food suggestions. Reviewed patient's dietary recall. Provided examples on ways to decrease sodium and fat intake in diet. Discouraged intake of processed foods and use of salt shaker. Encouraged fresh fruits and vegetables as well as whole grain sources of carbohydrates to maximize fiber intake.  Expect poor compliance.  BMI 52.2 - Morbidly Obese, Class III.  Current diet order is Heart Healthy, patient is consuming approximately 100% of meals at this time. Labs and medications reviewed. No further nutrition interventions warranted at this time. RD contact information provided. If additional nutrition issues arise, please re-consult RD.  Jarold Motto MS, RD, LDN Pager: [phone] After-hours pager: [phone]

## 2011-10-31 NOTE — Evaluation (Signed)
Occupational Therapy Assessment and Plan  Patient Details  Name: [name] MRN: [id number] Date of Birth: April 15, 1945  OT Diagnosis: acute pain, lumbago (low back pain), muscle weakness (generalized) and swelling of limbGood Rehab Potential:  Good ELOS:   7-9 days  Today's Date: 10/31/2011 Time: 0825-0940 Time Calculation (min): 75 min  Problem List:  Patient Active Problem List  Diagnosis  . HYPOTHYROIDISM  . HYPOKALEMIA  . THROMBOCYTOPENIA  . DEPRESSION  . AORTIC STENOSIS  . CONGESTIVE HEART FAILURE UNSPECIFIED  . TIA  . VENOUS INSUFFICIENCY  . EMPHYSEMA, MILD  . COPD  . ARTHRITIS  . Edema  . CAROTID BRUIT  . DYSPNEA ON EXERTION  . Cellulitis  . PVD (peripheral vascular disease)  . Spinal stenosis of lumbar region  . Lower extremity weakness  . Chronic diastolic CHF (congestive heart failure)  . Morbid obesity    Past Medical History:  Past Medical History  Diagnosis Date  . Carotid bruit     0-39% bilateral ICA by dopplers 03/2010  . Aortic stenosis     moderate echo 01/2009, then severe 03/2010. low risk myovue 11/2007 brest attenuation EF  63%  . Venous insufficiency   . Arthritis   . Depression   . COPD (chronic obstructive pulmonary disease)   . Hypothyroidism   . Thrombocytopenia   . Hypokalemia   . Edema   . Respiratory failure     Ventilator-dependent respiratory failure secondary to hypercarbia, cardiac arrest, presumed penumonia 12/2008  . Cardiac arrest     12/2010 in setting of respiratory failure with less than 15 minutes of CPR; initial rhythm was asystole  . PVD (peripheral vascular disease)     Eval by Dr. Excell Seltzer 2012: noninvasive immaging suggested significant aortoiliac disease, ABIs in mild range  - med rx given significant comorbidities  . H/O endoscopy     For ?ampullary mass - had EGD 07/2010 with no evidence of ampullary mass, some CBD dilitation   . Chronic pain    Past Surgical History:  Past Surgical History  Procedure Date    . Thyroidectomy   . Laparoscopic cholecystectomy   . Lithotripsy      for nephrolithiasis  . Vaginal hysterectomy      for menorrhagia  at age 42  . Breast cyst excision   . Rotator cuff repair   . Cesarean section   . Laminectomy     Assessment & Plan Clinical Impression:[name] is a 66 y.o. female with hx of COPD (previous VDRF for hypercarbic respiratory failure in 2010), chronic thrombocytopenia, morbid obesity, and aortic stenosis who is followed by Dr. Eden Emms. She has not previously been felt to be an operative candidate due to comorbidities for her AS, previously noted as severe by notes referring to echo 03/2010. She presented to [facility] On 10/24/11 with complaints of numbness and pain in her bilateral lower extremities and in her back-constant and no relief with rest or positional change. She has been dragging her right leg and unable to stand up. She denied bowel/bladder incontinence. Evaluated by Dr. Jule Ser and MRI spine done revealing progression of severe central canal stenosis at L4-5 and moderate canal stenosis at L5-S1. Surgical decompression recommended pending cardiology work up. She was evaluated by Dr. Daleen Squibb yesterday who felt that patient was at extremely high risk due to multiple medical issues. Treated with IV diuretics for LE edema. Had had complaints of productive cough. CXR with patchy RML infiltrate and was started on avelox 09/04  for possible PNA. Evaluated by therapy team and CIR recommended for progression.   Patient transferred to CIR on 10/30/2011 .    Patient currently requires max with basic self-care skills secondary to muscle weakness and decreased cardiorespiratoy endurance and decreased oxygen support.  Prior to hospitalization, patient could complete basic self care skills with min.  Patient will benefit from skilled intervention to decrease level of assist with basic self-care skills and increase independence with basic self-care skills  prior to discharge home with care partner.  Anticipate patient will require intermittent supervision and follow up home health.     OT Evaluation Precautions/Restrictions  Precautions Precautions: Fall Restrictions Weight Bearing Restrictions: No Other Position/Activity Restrictions: left knee pain chronic General Chart Reviewed: Yes Vital Signs Oxygen Therapy SpO2: 83 % (Off O2 x 1 min to walk to bathroom) O2 Device: Nasal cannula O2 Flow Rate (L/min): 2 L/min Pulse Oximetry Type: Intermittent Pain Pain Assessment Pain Assessment: 0-10 Pain Score:   9 Pain Type: Acute pain;Chronic pain Pain Location: Back Pain Orientation: Lower Pain Descriptors: Aching;Constant Pain Onset: On-going Multiple Pain Sites: Yes Home Living/Prior Functioning Home Living Lives With: Spouse Available Help at Discharge: Family Type of Home: House Home Access: Stairs to enter Home Layout: One level Bathroom Shower/Tub: Engineer, manufacturing systems: Standard Home Adaptive Equipment: Bedside commode/3-in-1;Walker - four wheeled;Wheelchair - manual Prior Function Level of Independence: Needs assistance with ADLs ADL   Vision/Perception  Vision - History Baseline Vision: Wears glasses all the time  Cognition Overall Cognitive Status: Appears within functional limits for tasks assessed Sensation Sensation Light Touch: Not tested (upper extremities) Hot/Cold: Appears Intact (upper extremities) Coordination Gross Motor Movements are Fluid and Coordinated: Yes Fine Motor Movements are Fluid and Coordinated: Yes Motor    Mobility     Trunk/Postural Assessment     Balance   Extremity/Trunk Assessment      See FIM for current functional status Refer to Care Plan for Long Term Goals  Recommendations for other services: None  Discharge Criteria: Patient will be discharged from OT if patient refuses treatment 3 consecutive times without medical reason, if treatment goals not met,  if there is a change in medical status, if patient makes no progress towards goals or if patient is discharged from hospital.  The above assessment, treatment plan, treatment alternatives and goals were discussed and mutually agreed upon: by patient  Collier Salina 10/31/2011, 9:59 AM

## 2011-10-31 NOTE — Progress Notes (Signed)
Subjective/Complaints: Feeling constipated. Pain is 8/10.  Baseline sob. Slept fairly well A 12 point review of systems has been performed and if not noted above is otherwise negative.   Objective: Vital Signs: Blood pressure 121/54, pulse 81, temperature 98.4 F (36.9 C), temperature source Oral, resp. rate 18, weight 114.4 kg (252 lb 3.3 oz), SpO2 94.00%. Dg Chest 2 View  10/31/2011  *RADIOLOGY REPORT*  Clinical Data: Follow up right middle lobe effusion.  CHEST - 2 VIEW  Comparison: 10/28/2011.  Findings: Improving aeration at the right lung base.  Small areas of atelectasis are present at the costophrenic angles bilaterally. No effusion.  Cardiopericardial silhouette is mildly enlarged. Right rotator cuff repair anchors.  IMPRESSION: Improving small focus of airspace disease in the right middle lobe. Development of mild bilateral basilar atelectasis.   Original Report Authenticated By: Andreas Newport, M.D.     Basename 10/31/11 0738 10/29/11 0550  WBC 9.4 6.5  HGB 12.1 12.8  HCT 38.4 40.3  PLT 98* 112*    Basename 10/31/11 0738 10/29/11 0550  NA 140 141  K 3.9 3.8  CL 99 100  CO2 36* 36*  GLUCOSE 129* 171*  BUN 19 16  CREATININE 0.88 0.75  CALCIUM 8.8 9.2   CBG (last 3)  No results found for this basename: GLUCAP:3 in the last 72 hours  Wt Readings from Last 3 Encounters:  10/30/11 114.4 kg (252 lb 3.3 oz)  10/29/11 116.03 kg (255 lb 12.8 oz)  10/22/10 93.441 kg (206 lb)    Physical Exam:  Nursing note and vitals reviewed.  Constitutional: She is oriented to person, place, and time. She appears well-developed and well-nourished.  Morbidly obese. Sedated appearing at times (fluctuates) HENT:  Head: Normocephalic and atraumatic.  Eyes: Pupils are equal, round, and reactive to light.  Neck: Normal range of motion. Neck supple.  Cardiovascular: Normal rate and regular rhythm. Exam reveals no S3, no S4 and no friction rub.  Murmur heard.  Systolic murmur is present with  a grade of 4/6  Pulmonary/Chest: Effort normal. She has wheezes.  Abdominal: Bowel sounds are normal. She exhibits no distension. There is no tenderness.  obese  Musculoskeletal: She exhibits edema.  Persistent 2+ edema BLE. kerlix on LLE with drainage. 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  Neurological: She is oriented to person, place, and time.  Follows commands without difficulty.  Skin: Skin is warm and dry.  Stasis changes BLE. She has more erythema in the right lower extremity than on left side  Sensation is reduced in bilateral feet to light touch.  Motor strength is 5/5 in bilateral deltoid, biceps, triceps, grip  4/5 in bilateral knee extensors 3 minus in hip flexors 2 minus in ankle dorsiflexors and plantar flexors  Assessment/Plan: 1. Functional deficits secondary to lumbar stenosis/ severe AS which require 3+ hours per day of interdisciplinary therapy in a comprehensive inpatient rehab setting. Physiatrist is providing close team supervision and 24 hour management of active medical problems listed below. Physiatrist and rehab team continue to assess barriers to discharge/monitor patient progress toward functional and medical goals. FIM:       FIM - Toileting Toileting steps completed by patient: Performs perineal hygiene;Adjust clothing prior to toileting;Adjust clothing after toileting Toileting: 4: Steadying assist  FIM - Diplomatic Services operational officer Devices: Bedside commode Toilet Transfers: 4-To toilet/BSC: Min A (steadying Pt. > 75%);4-From toilet/BSC: Min A (steadying Pt. > 75%)        Comprehension Comprehension Mode: Auditory Comprehension: 5-Follows  basic conversation/direction: With no assist  Expression Expression Mode: Verbal Expression: 5-Expresses basic needs/ideas: With no assist  Social Interaction Social Interaction: 5-Interacts appropriately 90% of the time - Needs monitoring or encouragement for participation or  interaction.  Problem Solving Problem Solving: 5-Solves basic problems: With no assist  Memory Memory: 5-Recognizes or recalls 90% of the time/requires cueing < 10% of the time  Medical Problem List and Plan:  1. DVT Prophylaxis/Anticoagulation: Pharmaceutical: Lovenox  2. Pain Management: continue home dose MS contin 120 mg bid with MSIR 15 mg q 4 hrs prn.   -pain typically worst in the am per pt.  3. Mood: continue prozac for chronic depression.  4. Neuropsych: This patient is capable of making decisions on his/her own behalf.  5. Morbid obesity: Pressure relief measures. Elevate BLE when in bed/chair due to severe peripheral edema.  6. Chronic CHF/Severe AS: Monitor daily weights. Low salt diet. Will have dietitian educate patient on low salt diet (two bags of lite chips in front of patient). Continue Lasix 40 mg bid (changed to po) and metoprolol 25 mg daily.   -don't think we will be able to wean oxygen 7. COPD: ongoing tobacco use.  8. PNA?: avelox D#2. Will add nebs to help with symtoms. Follow up CXR stable to improved  LOS (Days) 1 A FACE TO FACE EVALUATION WAS PERFORMED  Cristofer Yaffe T 10/31/2011, 8:40 AM

## 2011-10-31 NOTE — Progress Notes (Signed)
At approximately 0230 am patient's oxygen sats 78%, encouraged patient to deep breath. Called Dan A, PA received order for 2 liters of oxygen per nasal cannula. Will continue to monitor patient.

## 2011-10-31 NOTE — Progress Notes (Signed)
Pt very drowsy throughout the day. Would fall asleep mid sentence and awaken quickly. Alert and oriented but very drowsy. Pt sleeps while not in therapy.. Discussed pain medication regimen with Marissa Nestle, PA this morning and she has been taking all home medications. Nothing new ordered regarding pain. No complains of pain to RN. Pt wearing 2L O2 due to low stats throughout the day. Cont. To monitor pt.

## 2011-10-31 NOTE — Progress Notes (Signed)
Occupational Therapy Note  Patient Details  Name: [name] MRN: [id number] Date of Birth: March 12, 1945 Today's Date: 10/31/2011  Time: 1300-1328 Pt c/o 8/10 pain in lower back; stated that RN admin meds approx 30 mins prior to therapy Individual therapy Pt sittin EOB finishing lunch.  Demonstrated AE (reacher, shoe horn, sponge, and sock aid). Pt returned demonstrated and stated she would probably purchase AE; literature provided.  Husband arrived and discussed therapy process and LTGs with husband and pt.  Both pleased with goals.   Lavone Neri [facility] 10/31/2011, 1:30 PM

## 2011-10-31 NOTE — Progress Notes (Signed)
Physical Therapy Session Note  Patient Details  Name: [name] MRN: [id number] Date of Birth: 21-Apr-1945  Today's Date: 10/31/2011 Time: 1400-1445 Time Calculation (min): 45 min  Short Term Goals: Week 1:   =LTGs  Skilled Therapeutic Interventions/Progress Updates:    Pt ambulating back from bathroom with nursing when PT entered, pt had been off oxygen for eating and for ambulation to/from bathroom, SpO2 = 78%. Pt reported she did not feel out of breath however she sounded dyspneic. Discussed with RN who agreed that pt likely needed to have supplemental O2 on with all mobility at this point.   Gait training x 45', 45' with RW, min assist. Verbal/tactile cues needed frequently for improved posture, hip extension, and scapular depression. SpO2 down to 86%, returns to 94% with seated rest and cues for breathing while on 2L O2. Seated long arc quads and marching, 2 x 10 reps each.   Pt drowsy during therapy and frequently falls asleep mid-sentence. RN present during part of session and discussed with pt how pain medication can often make pt's drowsy - pt became emotional as she felt her medications may be decreased. Husband mentioned pain medications again later in session and pt again emotional.   Therapy Documentation Precautions:  Precautions Precautions: Fall Restrictions Weight Bearing Restrictions: No Other Position/Activity Restrictions: left knee pain chronic Pain: Pain Assessment Pain Assessment: 0-10 Pain Score:   8 Pain Type: Chronic pain Pain Location: Back Pain Orientation: Lower Pain Descriptors: Aching Pain Onset: On-going Patients Stated Pain Goal: 2 Pain Intervention(s): Repositioned (pt had recently received medication) 2nd Pain Site Pain Score: 8 Pain Type: Chronic pain Pain Location: Knee Pain Orientation: Left Pain Descriptors: Aching Pain Onset: With Activity Pain Intervention(s): Repositioned;Other (Comment) (Rest as needed)  See FIM for current  functional status  Therapy/Group: Individual Therapy  [name] 10/31/2011, 5:08 PM

## 2011-10-31 NOTE — Evaluation (Signed)
Physical Therapy Assessment and Plan  Patient Details  Name: DEREONA KOLODNY MRN: [id number] Date of Birth: April 30, 1945  PT Diagnosis: Abnormal posture, Abnormality of gait, Cognitive deficits, Difficulty walking, Impaired cognition, Impaired sensation, Low back pain, Muscle weakness and Osteoarthritis Rehab Potential: Good ELOS: 7-10 days   Today's Date: 10/31/2011 Time: 7829-5621 Time Calculation (min): 67 min  Problem List:  Patient Active Problem List  Diagnosis  . HYPOTHYROIDISM  . HYPOKALEMIA  . THROMBOCYTOPENIA  . DEPRESSION  . AORTIC STENOSIS  . CONGESTIVE HEART FAILURE UNSPECIFIED  . TIA  . VENOUS INSUFFICIENCY  . EMPHYSEMA, MILD  . COPD  . ARTHRITIS  . Edema  . CAROTID BRUIT  . DYSPNEA ON EXERTION  . Cellulitis  . PVD (peripheral vascular disease)  . Spinal stenosis of lumbar region  . Lower extremity weakness  . Chronic diastolic CHF (congestive heart failure)  . Morbid obesity    Past Medical History:  Past Medical History  Diagnosis Date  . Carotid bruit     0-39% bilateral ICA by dopplers 03/2010  . Aortic stenosis     moderate echo 01/2009, then severe 03/2010. low risk myovue 11/2007 brest attenuation EF  63%  . Venous insufficiency   . Arthritis   . Depression   . COPD (chronic obstructive pulmonary disease)   . Hypothyroidism   . Thrombocytopenia   . Hypokalemia   . Edema   . Respiratory failure     Ventilator-dependent respiratory failure secondary to hypercarbia, cardiac arrest, presumed penumonia 12/2008  . Cardiac arrest     12/2010 in setting of respiratory failure with less than 15 minutes of CPR; initial rhythm was asystole  . PVD (peripheral vascular disease)     Eval by Dr. Excell Seltzer 2012: noninvasive immaging suggested significant aortoiliac disease, ABIs in mild range  - med rx given significant comorbidities  . H/O endoscopy     For ?ampullary mass - had EGD 07/2010 with no evidence of ampullary mass, some CBD dilitation   . Chronic  pain    Past Surgical History:  Past Surgical History  Procedure Date  . Thyroidectomy   . Laparoscopic cholecystectomy   . Lithotripsy      for nephrolithiasis  . Vaginal hysterectomy      for menorrhagia  at age 64  . Breast cyst excision   . Rotator cuff repair   . Cesarean section   . Laminectomy     Assessment & Plan Clinical Impression: NAVEEN LORUSSO is a 66 y.o. female with hx of COPD (previous VDRF for hypercarbic respiratory failure in 2010), chronic thrombocytopenia, morbid obesity, and aortic stenosis who is followed by Dr. Eden Emms. She has not previously been felt to be an operative candidate due to comorbidities for her AS, previously noted as severe by notes referring to echo 03/2010. She presented to [facility] On 10/24/11 with complaints of numbness and pain in her bilateral lower extremities and in her back-constant and no relief with rest or positional change. She has been dragging her right leg and unable to stand up. She denied bowel/bladder incontinence. Evaluated by Dr. Jule Ser and MRI spine done revealing progression of severe central canal stenosis at L4-5 and moderate canal stenosis at L5-S1. Surgical decompression recommended pending cardiology work up. She was evaluated by Dr. Daleen Squibb yesterday who felt that patient was at extremely high risk due to multiple medical issues. Treated with IV diuretics for LE edema. Had had complaints of productive cough. CXR with patchy RML infiltrate and  was started on avelox 09/04 for possible PNA. Patient transferred to CIR on 10/30/2011 .   Patient currently requires min with mobility secondary to muscle weakness, decreased cardiorespiratoy endurance and decreased oxygen support, impaired timing and sequencing, decreased problem solving and decreased memory and decreased standing balance and decreased balance strategies.  Pt currently most limited by impaired endurance, husband on oxygen and just had open heart surgery - will only  be able to provide supervision. Limited distance Ambulator prior to admission, used rollator walker. Desaturates to 86% SpO2 on 2L O2 with bed mobility, ambulation, and stairs. Quickly recovers to 94% with rest and cues for breathing. Prior to hospitalization, patient was supervision with mobility and lived with Spouse in a House home.  Home access is 5 in the front, 5-6 in the back Stairs to enter.  Patient will benefit from skilled PT intervention to maximize safe functional mobility and minimize fall risk for planned discharge home with 24 hour supervision.  Anticipate patient will benefit from follow up HH at discharge.  PT - End of Session Endurance Deficit: Yes Endurance Deficit Description: Tolerates minutes of therapy at a time with noted desaturation. Frequent rest needed PT Assessment Rehab Potential: Good Barriers to Discharge: Decreased caregiver support PT Plan PT Frequency: 2-3 X/day, 60-90 minutes Estimated Length of Stay: 7-10 days PT Treatment/Interventions: Ambulation/gait training;Balance/vestibular training;Cognitive remediation/compensation;Discharge planning;Disease management/prevention;DME/adaptive equipment instruction;Functional mobility training;Neuromuscular re-education;Pain management;Patient/family education;Psychosocial support;Stair training;Therapeutic Activities;Therapeutic Exercise;UE/LE Strength taining/ROM;Wheelchair propulsion/positioning PT Recommendation Follow Up Recommendations: Home health PT;24 hour supervision/assistance Equipment Recommended: Rolling walker with 5" wheels  PT Evaluation Precautions/Restrictions Precautions Precautions: Fall Restrictions Weight Bearing Restrictions: No Other Position/Activity Restrictions: left knee pain chronic  Vital Signs Therapy Vitals Patient Position, if appropriate: Sitting Oxygen Therapy SpO2: 86 % (post bed mobility, ambulation while on 2L [place] o2) O2 Device: Nasal cannula O2 Flow Rate (L/min): 2  L/min Pulse Oximetry Type: Intermittent Pain Pain Assessment Pain Assessment: 0-10 Pain Score:   8 Pain Type: Chronic pain Pain Location: Back Pain Orientation: Lower Pain Descriptors: Discomfort;Aching Pain Onset: On-going Pain Intervention(s): Other (Comment);Repositioned (had just had some pain medicine) Multiple Pain Sites: Yes Home Living/Prior Functioning Home Living Lives With: Spouse Available Help at Discharge: Family Type of Home: House Home Access: Stairs to enter [employer] of Steps: 5 in the front, 5-6 in the back  Entrance Stairs-Rails: Right;Left (can't reach both of them at same time) Home Layout: One level Bathroom Shower/Tub: Engineer, manufacturing systems: Standard Home Adaptive Equipment: Bedside commode/3-in-1;Walker - four wheeled;Wheelchair - manual Additional Comments: wheelchair is borrowed. has been using a maskshift ramp to get into/out of house, husband as been pushing pt in and out.  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  Vision/Perception  Vision - History Baseline Vision: Wears glasses all the time  Cognition Overall Cognitive Status: Appears within functional limits for tasks assessed Arousal/Alertness: Lethargic (falling asleep during evaluation (just had pain medication)) Orientation Level: Oriented X4 Memory: Impaired Memory Impairment: Decreased recall of new information;Decreased short term memory Comments: Pt may have some decreased safety awareness however difficult to determine on evaluation secondary to lethargy.  Sensation Sensation Light Touch: Impaired Detail Light Touch Impaired Details: Impaired RLE;Impaired LLE (toes and feet) Hot/Cold: Appears Intact (upper extremities) Coordination Gross Motor  Movements are Fluid and Coordinated: Yes Fine Motor Movements are Fluid and Coordinated: Yes  Mobility Bed Mobility  Bed Mobility: Supine to Sit Supine to Sit: 4: Min assist Supine to Sit Details (indicate cue type and reason): Pt with inefficient movements, holds breath while performing task Sit to Supine: 4: Min assist Sit to Supine - Details (indicate cue type and reason): Pt with inefficient movements, holds breath while performing task. Difficulty lifting bil. LEs into bed Transfers Sit to Stand: 4: Min assist;From bed;From chair/3-in-1 Sit to Stand Details (indicate cue type and reason): Verbal cues needed repeatedly for safe UE placement  Stand to Sit: 4: Min assist Stand to Sit Details: Assist to control descent. Pt able to progress to supervision during session.  Locomotion  Ambulation Ambulation: Yes Ambulation/Gait Assistance: 4: Min assist Ambulation Distance (Feet): 30 Feet (30, 15) Assistive device: Rolling walker Ambulation/Gait Assistance Details: With fatigue pt advances RW too far anteriorly in unsafe manor, very flexed posture.  Gait Gait: Yes Gait Pattern: Within Functional Limits Gait Pattern: Step-through pattern;Decreased stride length;Trunk flexed;Shuffle (Pt with very slow gait and minimal foot clearance. ) Stairs / Additional Locomotion Stairs: Yes Stairs Assistance: 3: Mod assist Stairs Assistance Details (indicate cue type and reason): Sequencial cues needed for safety Stair Management Technique: Two rails;Step to pattern Number of Stairs: 2  Wheelchair Mobility Wheelchair Mobility: Yes Wheelchair Assistance: 4: Systems analyst: Both upper extremities Wheelchair Parts Management: Needs assistance Distance: 40'   Balance Balance Balance Assessed: Yes Cytogeneticist Standing - Balance Support: Left upper extremity supported Static Standing - Level of Assistance: 5: Stand by assistance Dynamic Standing  Balance Dynamic Standing - Balance Support: During functional activity Dynamic Standing - Level of Assistance: 4: Min assist Extremity Assessment      RLE Assessment RLE Assessment: Exceptions to [facility] RLE AROM (degrees) RLE Overall AROM Comments: Limited by body habitus RLE Strength RLE Overall Strength Comments: Generalized deconditioning, grossly >/= 3+/5. Symmetrical to Lt. LE  LLE Assessment LLE Assessment: Exceptions to WFL LLE AROM (degrees) LLE Overall AROM Comments: limited by body habitus LLE Strength LLE Overall Strength Comments: Generalized deconditioning, grossly >/= 3+/5. Symmetrical to Lt. LE    Skilled Therapeutic Interventions/Progress Updates:  Gait training x 30', 15' with RW, cues needed for improved posture and safe proximity of RW. Pt with very slow a laborious gait secondary to impaired endurance. Practiced stand pivot transfers to/from bed side commode performed with up to min assist. Verbal cues needed for safety at times. Extended rest needed with all activities.    See FIM for current functional status Refer to Care Plan for Long Term Goals  Recommendations for other services: None  Discharge Criteria: Patient will be discharged from PT if patient refuses treatment 3 consecutive times without medical reason, if treatment goals not met, if there is a change in medical status, if patient makes no progress towards goals or if patient is discharged from hospital.  The above assessment, treatment plan, treatment alternatives and goals were discussed and mutually agreed upon: by patient  [name] 10/31/2011, 12:03 PM

## 2011-11-01 ENCOUNTER — Inpatient Hospital Stay (HOSPITAL_COMMUNITY): Payer: Medicare Other | Admitting: Occupational Therapy

## 2011-11-01 LAB — CBC
Platelets: 97 10*3/uL — ABNORMAL LOW (ref 150–400)
RBC: 4.19 MIL/uL (ref 3.87–5.11)
RDW: 14.5 % (ref 11.5–15.5)
WBC: 5.6 10*3/uL (ref 4.0–10.5)

## 2011-11-01 LAB — CREATININE, SERUM
Creatinine, Ser: 0.71 mg/dL (ref 0.50–1.10)
GFR calc Af Amer: >90 mL/min (ref >90)
GFR calc non Af Amer: 89 mL/min — ABNORMAL LOW (ref >90)

## 2011-11-01 MED ORDER — MORPHINE SULFATE ER 60 MG PO TBCR
90.0000 mg | EXTENDED_RELEASE_TABLET | Freq: Two times a day (BID) | ORAL | Status: DC
  Administered 2011-11-01 – 2011-11-02 (×2): 90 mg via ORAL
  Filled 2011-11-01 (×2): qty 1

## 2011-11-01 NOTE — Progress Notes (Signed)
Occupational Therapy Session Note  Patient Details  Name: [name] MRN: [id number] Date of Birth: 10/23/1945  Today's Date: 11/01/2011 Time: 4098-1191 Time Calculation (min): 60 min  Skilled Therapeutic Interventions/Progress Updates:  Patient found seated edge of bed. Engaged in bed mobility for functional ambulation around room -> sink for ADL retraining. Patient performed UB/LB bathing & dressing in sit -> stand position, then ambulated -> bathroom for toilet transfer and toileting. Focused skilled intervention on overall activity tolerance/endurance, energy conservation techniques prn, sit/stands, dynamic standing balance/tolerance/endurance, and functional mobility using rolling walker. At end of session assisted patient to sit edge of bed with call bell and phone within reach. Patient's husband present during entire session.   Precautions:  Precautions Precautions: Fall Restrictions Weight Bearing Restrictions: No Other Position/Activity Restrictions: left knee pain chronic  See FIM for current functional status  Therapy/Group: Individual Therapy  [name] 11/01/2011, 11:58 AM

## 2011-11-01 NOTE — Progress Notes (Signed)
Subjective/Complaints: "my back feels about the same". Satisfied with progress yesterday. A 12 point review of systems has been performed and if not noted above is otherwise negative.   Objective: Vital Signs: Blood pressure 141/53, pulse 77, temperature 98.1 F (36.7 C), temperature source Oral, resp. rate 19, weight 114.4 kg (252 lb 3.3 oz), SpO2 96.00%. Dg Chest 2 View  10/31/2011  *RADIOLOGY REPORT*  Clinical Data: Follow up right middle lobe effusion.  CHEST - 2 VIEW  Comparison: 10/28/2011.  Findings: Improving aeration at the right lung base.  Small areas of atelectasis are present at the costophrenic angles bilaterally. No effusion.  Cardiopericardial silhouette is mildly enlarged. Right rotator cuff repair anchors.  IMPRESSION: Improving small focus of airspace disease in the right middle lobe. Development of mild bilateral basilar atelectasis.   Original Report Authenticated By: Andreas Newport, M.D.     Basename 10/31/11 0738  WBC 9.4  HGB 12.1  HCT 38.4  PLT 98*    Basename 10/31/11 0738  NA 140  K 3.9  CL 99  CO2 36*  GLUCOSE 129*  BUN 19  CREATININE 0.88  CALCIUM 8.8   CBG (last 3)  No results found for this basename: GLUCAP:3 in the last 72 hours  Wt Readings from Last 3 Encounters:  10/30/11 114.4 kg (252 lb 3.3 oz)  10/29/11 116.03 kg (255 lb 12.8 oz)  10/22/10 93.441 kg (206 lb)    Physical Exam:  Nursing note and vitals reviewed.  Constitutional: She is oriented to person, place, and time. She appears well-developed and well-nourished.  Morbidly obese. Sedated appearing at times (fluctuates) HENT:  Head: Normocephalic and atraumatic.  Eyes: Pupils are equal, round, and reactive to light.  Neck: Normal range of motion. Neck supple.  Cardiovascular: Normal rate and regular rhythm. Exam reveals no S3, no S4 and no friction rub.  Murmur heard.  Systolic murmur is present with a grade of 4/6  Pulmonary/Chest: Effort normal. She has wheezes.  Abdominal:  Bowel sounds are normal. She exhibits no distension. There is no tenderness.  obese  Musculoskeletal: She exhibits edema.  Persistent 2+ edema BLE. kerlix on LLE with drainage. 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  Neurological: She is oriented to person, place, and time.  Follows commands without difficulty.  Skin: Skin is warm and dry.  Stasis changes BLE. She has more erythema in the right lower extremity than on left side  Sensation is reduced in bilateral feet to light touch.  Motor strength is 5/5 in bilateral deltoid, biceps, triceps, grip  4/5 in bilateral knee extensors 3 minus in hip flexors 2 minus in ankle dorsiflexors and plantar flexors  Assessment/Plan: 1. Functional deficits secondary to lumbar stenosis/ severe AS which require 3+ hours per day of interdisciplinary therapy in a comprehensive inpatient rehab setting. Physiatrist is providing close team supervision and 24 hour management of active medical problems listed below. Physiatrist and rehab team continue to assess barriers to discharge/monitor patient progress toward functional and medical goals. FIM: FIM - Bathing Bathing Steps Patient Completed: Chest;Right Arm;Left Arm;Abdomen;Right upper leg;Left upper leg Bathing: 3: Mod-Patient completes 5-7 4f 10 parts or 50-74%  FIM - Upper Body Dressing/Undressing Upper body dressing/undressing steps patient completed: Thread/unthread right sleeve of pullover shirt/dresss;Thread/unthread left sleeve of pullover shirt/dress;Put head through opening of pull over shirt/dress;Pull shirt over trunk Upper body dressing/undressing: 5: Set-up assist to: Obtain clothing/put away FIM - Lower Body Dressing/Undressing Lower body dressing/undressing: 1: Total-Patient completed less than 25% of tasks  FIM -  Toileting Toileting steps completed by patient: Performs perineal hygiene;Adjust clothing prior to toileting;Adjust clothing after toileting Toileting: 1: Total-Patient completed zero  steps, helper did all 3  FIM - Diplomatic Services operational officer Devices: Bedside commode Toilet Transfers: 4-To toilet/BSC: Min A (steadying Pt. > 75%);4-From toilet/BSC: Min A (steadying Pt. > 75%)  FIM - Bed/Chair Transfer Bed/Chair Transfer: 4: Supine > Sit: Min A (steadying Pt. > 75%/lift 1 leg);4: Sit > Supine: Min A (steadying pt. > 75%/lift 1 leg);4: Bed > Chair or W/C: Min A (steadying Pt. > 75%);4: Chair or W/C > Bed: Min A (steadying Pt. > 75%)  FIM - Locomotion: Wheelchair Distance: 40' Locomotion: Wheelchair: 1: Travels less than 50 ft with minimal assistance (Pt.>75%) FIM - Locomotion: Ambulation Locomotion: Ambulation Assistive Devices: Designer, industrial/product Ambulation/Gait Assistance: 4: Min assist Locomotion: Ambulation: 1: Travels less than 50 ft with minimal assistance (Pt.>75%)  Comprehension Comprehension Mode: Auditory Comprehension: 5-Follows basic conversation/direction: With extra time/assistive device  Expression Expression Mode: Verbal Expression: 5-Expresses basic needs/ideas: With no assist  Social Interaction Social Interaction: 5-Interacts appropriately 90% of the time - Needs monitoring or encouragement for participation or interaction.  Problem Solving Problem Solving: 5-Solves basic problems: With no assist  Memory Memory: 6-More than reasonable amt of time  Medical Problem List and Plan:  1. DVT Prophylaxis/Anticoagulation: Pharmaceutical: Lovenox  2. Pain Management: continue home dose MS contin 120 mg bid with MSIR 15 mg q 4 hrs prn.   -pain typically worst in the am per pt.   -i discussed the medication plan with the patient, and given her somnolence on the higher dose ms contin, she would like to try a lower dose. i will decrease it to 90mg  bid with the same breakthrough dose 3. Mood: continue prozac for chronic depression.  4. Neuropsych: This patient is capable of making decisions on his/her own behalf.  5. Morbid obesity:  Pressure relief measures. Elevate BLE when in bed/chair due to severe peripheral edema.  6. Chronic CHF/Severe AS: Monitor daily weights. Low salt diet. Will have dietitian educate patient on low salt diet (two bags of lite chips in front of patient). Continue Lasix 40 mg bid (changed to po) and metoprolol 25 mg daily.   -don't think we will be able to wean oxygen 7. COPD: ongoing tobacco use.  8. PNA?: avelox D#3. Will add nebs to help with symtoms. Follow up CXR stable to improved  LOS (Days) 2 A FACE TO FACE EVALUATION WAS PERFORMED  SWARTZ,ZACHARY T 11/01/2011, 7:35 AM

## 2011-11-02 ENCOUNTER — Encounter (HOSPITAL_COMMUNITY): Payer: Medicare Other | Admitting: *Deleted

## 2011-11-02 ENCOUNTER — Inpatient Hospital Stay (HOSPITAL_COMMUNITY): Payer: Medicare Other | Admitting: Physical Therapy

## 2011-11-02 ENCOUNTER — Inpatient Hospital Stay (HOSPITAL_COMMUNITY): Payer: Medicare Other | Admitting: *Deleted

## 2011-11-02 MED ORDER — MORPHINE SULFATE ER 60 MG PO TBCR
120.0000 mg | EXTENDED_RELEASE_TABLET | Freq: Two times a day (BID) | ORAL | Status: DC
  Administered 2011-11-02 – 2011-11-07 (×10): 120 mg via ORAL
  Filled 2011-11-02 (×10): qty 2

## 2011-11-02 MED ORDER — SENNOSIDES-DOCUSATE SODIUM 8.6-50 MG PO TABS
4.0000 | ORAL_TABLET | Freq: Two times a day (BID) | ORAL | Status: DC
  Administered 2011-11-03 – 2011-11-07 (×8): 4 via ORAL
  Filled 2011-11-02 (×11): qty 4

## 2011-11-02 NOTE — Plan of Care (Signed)
Overall Plan of Care [facility]) Patient Details Name: [name] MRN: [id number] DOB: 10-27-1945  Diagnosis:  Lumbar stenosis, low back pain  Primary Diagnosis:    Spinal stenosis of lumbar region Co-morbidities: hypokalmia, TIA, COPD, obesity Functional Problem List  Patient demonstrates impairments in the following areas: Bladder, Bowel, Edema, Endurance, Medication Management, Nutrition, Pain, Safety and Skin Integrity  Basic ADL's: grooming, bathing, dressing and toileting Advanced ADL's: none  Transfers:  bed mobility, bed to chair, toilet, tub/shower, car and furniture Locomotion:  ambulation  Additional Impairments:  None  Anticipated Outcomes Item Anticipated Outcome  Eating/Swallowing    Basic self-care  Mod I  Tolieting  Mod I  Bowel/Bladder    Transfers  Mod I  Locomotion  supervision  Communication    Cognition    Pain    Safety/Judgment    Other     Therapy Plan: PT Frequency: 2-3 X/day, 60-90 minutes OT Frequency: 1-2 X/day, 60-90 minutes     Team Interventions: Item RN PT OT SLP SW TR Other  Self Care/Advanced ADL Retraining   x      Neuromuscular Re-Education  x       Therapeutic Activities  x x      UE/LE Strength Training/ROM  x x      UE/LE Coordination Activities  x       Visual/Perceptual Remediation/Compensation         DME/Adaptive Equipment Instruction  x x      Therapeutic Exercise  x x      Balance/Vestibular Training  x x      Patient/Family Education x x x      Cognitive Remediation/Compensation         Functional Mobility Training  x x      Ambulation/Gait Training  x       Museum/gallery curator  x       Wheelchair Propulsion/Positioning  x       Functional Tourist information centre manager Reintegration  x       Dysphagia/Aspiration Film/video editor         Bladder Management x        Bowel Management x        Disease Management/Prevention x        Pain Management x x         Medication Management x        Skin Care/Wound Management x        Splinting/Orthotics  x       Discharge Planning x x       Psychosocial Support x                           Team Discharge Planning: Destination:  Home Projected Follow-up:  PT, OT and Home Health Projected Equipment Needs:  Tub Bench Patient/family involved in discharge planning:  Yes  MD ELOS: 7-10 days Medical Rehab Prognosis:  Good Assessment: The patient has been admitted for CIR therapies due to severe lumbar stenosis which is being managed conservatively due to comorbities. The team will be address, safety, adaptive equipment, fxnl mobility, ADL's, pain. Goals are supervision to mod I.   This document was completed on 11/02/11 although signature is 11/03/11

## 2011-11-02 NOTE — Progress Notes (Signed)
Occupational Therapy Note  Patient Details  Name: [name] MRN: [id number] Date of Birth: 1945/09/04 Today's Date: 11/02/2011 Time:  1035-1140  (65 min) Individual therapy Pain:  7/10 lower back, left knee.  See MAR  Engaged in gathering clothes at [facility] level for bathing and dressing.  Pt. Ambulated with close supervision to bathroom and was safe with transfer and toileting.  She bathed and dressed EOB with assistance with lower legs.  Discussed AE for reaching feet and dressing pants.  Husband was doing PTA.  With instructional cueing, pt bathed left foot by propping on side of bed but unable to do other leg.  Pt. Did not want to practice with AE.  Provided extended oxygen tubing for pt to get around room with Oxygen on.  Sats = 89% without oxygen and 94% with O2.    Humberto Seals 11/02/2011, 6:27 PM

## 2011-11-02 NOTE — Progress Notes (Signed)
Subjective/Complaints: Back pain was worse with decreased morphine dose. Complains of congestion. A 12 point review of systems has been performed and if not noted above is otherwise negative.   Objective: Vital Signs: Blood pressure 133/57, pulse 72, temperature 98.1 F (36.7 C), temperature source Oral, resp. rate 24, weight 117.1 kg (258 lb 2.5 oz), SpO2 90.00%. No results found.  Basename 11/01/11 0700 10/31/11 0738  WBC 5.6 9.4  HGB 12.1 12.1  HCT 38.4 38.4  PLT 97* 98*    Basename 11/01/11 0700 10/31/11 0738  NA -- 140  K -- 3.9  CL -- 99  CO2 -- 36*  GLUCOSE -- 129*  BUN -- 19  CREATININE 0.71 0.88  CALCIUM -- 8.8   CBG (last 3)  No results found for this basename: GLUCAP:3 in the last 72 hours  Wt Readings from Last 3 Encounters:  11/02/11 117.1 kg (258 lb 2.5 oz)  10/29/11 116.03 kg (255 lb 12.8 oz)  10/22/10 93.441 kg (206 lb)    Physical Exam:  Nursing note and vitals reviewed.  Constitutional: She is oriented to person, place, and time. She appears well-developed and well-nourished.  Morbidly obese. Sedated appearing at times (fluctuates) HENT:  Head: Normocephalic and atraumatic.  Eyes: Pupils are equal, round, and reactive to light.  Neck: Normal range of motion. Neck supple.  Cardiovascular: Normal rate and regular rhythm. Exam reveals no S3, no S4 and no friction rub.  Murmur heard.  Systolic murmur is present with a grade of 4/6  Pulmonary/Chest: Effort normal. She has scattered rhonchi, upper airway sounds Abdominal: Bowel sounds are normal. She exhibits no distension. There is no tenderness.  obese  Musculoskeletal: She exhibits edema.  Persistent 2+ edema BLE. kerlix on LLE with drainage. 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  Neurological: She is oriented to person, place, and time.  Follows commands without difficulty.  Skin: Skin is warm and dry.  Stasis changes BLE. She has more erythema in the right lower extremity than on left side    Sensation is reduced in bilateral feet to light touch.  Motor strength is 5/5 in bilateral deltoid, biceps, triceps, grip  4/5 in bilateral knee extensors 3 minus in hip flexors 2 minus in ankle dorsiflexors and plantar flexors  Assessment/Plan: 1. Functional deficits secondary to lumbar stenosis/ severe AS which require 3+ hours per day of interdisciplinary therapy in a comprehensive inpatient rehab setting. Physiatrist is providing close team supervision and 24 hour management of active medical problems listed below. Physiatrist and rehab team continue to assess barriers to discharge/monitor patient progress toward functional and medical goals. FIM: FIM - Bathing Bathing Steps Patient Completed: Chest;Right Arm;Left Arm;Abdomen;Front perineal area;Right upper leg;Left upper leg Bathing: 3: Mod-Patient completes 5-7 53f 10 parts or 50-74%  FIM - Upper Body Dressing/Undressing Upper body dressing/undressing steps patient completed: Thread/unthread right sleeve of pullover shirt/dresss;Thread/unthread left sleeve of pullover shirt/dress;Put head through opening of pull over shirt/dress;Pull shirt over trunk Upper body dressing/undressing: 5: Set-up assist to: Obtain clothing/put away FIM - Lower Body Dressing/Undressing Lower body dressing/undressing steps patient completed: Pull underwear up/down;Thread/unthread right pants leg;Thread/unthread left pants leg;Pull pants up/down Lower body dressing/undressing: 2: Max-Patient completed 25-49% of tasks  FIM - Toileting Toileting steps completed by patient: Adjust clothing prior to toileting;Performs perineal hygiene;Adjust clothing after toileting Toileting: 5: Supervision: Safety issues/verbal cues  FIM - Diplomatic Services operational officer Devices: Bedside commode Toilet Transfers: 5-To toilet/BSC: Supervision (verbal cues/safety issues)  FIM - Games developer Transfer: 4: Chair  or W/C > Bed: Min A (steadying Pt. >  75%);4: Bed > Chair or W/C: Min A (steadying Pt. > 75%)  FIM - Locomotion: Wheelchair Distance: 40' Locomotion: Wheelchair: 1: Travels less than 50 ft with minimal assistance (Pt.>75%) FIM - Locomotion: Ambulation Locomotion: Ambulation Assistive Devices: Designer, industrial/product Ambulation/Gait Assistance: 4: Min assist Locomotion: Ambulation: 1: Travels less than 50 ft with minimal assistance (Pt.>75%)  Comprehension Comprehension Mode: Auditory Comprehension: 6-Follows complex conversation/direction: With extra time/assistive device  Expression Expression Mode: Verbal Expression: 6-Expresses complex ideas: With extra time/assistive device  Social Interaction Social Interaction: 6-Interacts appropriately with others with medication or extra time (anti-anxiety, antidepressant).  Problem Solving Problem Solving: 6-Solves complex problems: With extra time  Memory Memory: 6-More than reasonable amt of time  Medical Problem List and Plan:  1. DVT Prophylaxis/Anticoagulation: Pharmaceutical: Lovenox  2. Pain Management: continue home dose MS contin 120 mg bid with MSIR 15 mg q 4 hrs prn.   -pain typically worst in the am per pt.   -pt prefers to increase back to the 120mg  bid dosing despite her sedation. She understands that this is the tradeoff. 3. Mood: continue prozac for chronic depression.  4. Neuropsych: This patient is capable of making decisions on his/her own behalf.  5. Morbid obesity: Pressure relief measures. Elevate BLE when in bed/chair due to severe peripheral edema.  6. Chronic CHF/Severe AS: Monitor daily weights. Low salt diet. Will have dietitian educate patient on low salt diet (two bags of lite chips in front of patient). Continue Lasix 40 mg bid (changed to po) and metoprolol 25 mg daily.   -don'[name] think we will be able to wean oxygen 7. COPD: ongoing tobacco use.  8. PNA?: avelox D#4. Will add nebs to help with symtoms. Follow up CXR stable to  improved  -IS  -robitussin LOS (Days) 3 A FACE TO FACE EVALUATION WAS PERFORMED  [name] 11/02/2011, 7:34 AM

## 2011-11-02 NOTE — Progress Notes (Signed)
LBM 9/4   Patient now receiving2senokotts bid.....[name] Kitchenstates at home she took 4 sennott bid and 2 colace bid and sometimes used glove and K-Y jelly to disempact herself...will request increase in laxative...she will not take use a suppository.[name] Kitchenonly wants po meds.

## 2011-11-02 NOTE — Progress Notes (Signed)
Physical Therapy Note  Patient Details  Name: [name] MRN: [id number] Date of Birth: 11-17-1945 Today's Date: 11/02/2011  1445 Pt refused 60 minute PT group session (UE exercise group).   Ambermarie Honeyman,JIM 11/02/2011, 8:33 AM

## 2011-11-02 NOTE — Progress Notes (Signed)
Physical Therapy Note  Patient Details  Name: [name] MRN: [id number] Date of Birth: Dec 29, 1945 Today's Date: 11/02/2011  Time: 1300-1343 43 minutes  No c/o pain.  Gait training with supervision with RW 3 x 50' with 2LO2, oxygen 96%.   Stair training 2 x 3 stairs with min A, O2 89% after stairs but returned to 96% with rest and cues for deep breathing.  Practiced bed mobility on mat at height of pt's bed at home.  Pt requires step to get into bed with min A for stepping up.  Pt able to scoot hips back but requires assist for lifting 1 LE into bed.  Pt reports she wants to get lower bed at home.  At simulated lower height pt able to scoot hips back far enough on bed that she is able to lift LEs into bed without assistance.  Discussed with pt/husband that this will be more difficult on bed vs treatment mat and that pt should practice on bed in room.  Husband expresses understanding.  Individual therapy    Kerina Simoneau 11/02/2011, 1:44 PM

## 2011-11-03 ENCOUNTER — Inpatient Hospital Stay (HOSPITAL_COMMUNITY): Payer: Medicare Other | Admitting: Physical Therapy

## 2011-11-03 ENCOUNTER — Inpatient Hospital Stay (HOSPITAL_COMMUNITY): Payer: Medicare Other

## 2011-11-03 DIAGNOSIS — Z5189 Encounter for other specified aftercare: Secondary | ICD-10-CM

## 2011-11-03 DIAGNOSIS — M48061 Spinal stenosis, lumbar region without neurogenic claudication: Secondary | ICD-10-CM

## 2011-11-03 MED ORDER — SORBITOL 70 % SOLN: 30.0000 mL | Freq: Every day | Status: DC | PRN

## 2011-11-03 MED ORDER — FUROSEMIDE 40 MG PO TABS
40.0000 mg | ORAL_TABLET | Freq: Three times a day (TID) | ORAL | Status: DC
  Administered 2011-11-03 (×2): 40 mg via ORAL
  Filled 2011-11-03 (×6): qty 1

## 2011-11-03 MED ORDER — FAMOTIDINE 20 MG PO TABS
20.0000 mg | ORAL_TABLET | Freq: Every day | ORAL | Status: DC
  Administered 2011-11-03 – 2011-11-07 (×5): 20 mg via ORAL
  Filled 2011-11-03 (×8): qty 1

## 2011-11-03 NOTE — Progress Notes (Signed)
Physical Therapy Session Note  Patient Details  Name: [name] MRN: [id number] Date of Birth: 12-25-1945  Today's Date: 11/03/2011 Time: 0933-1000 Time Calculation (min): 27 min  Short Term Goals: Week 1:   = LTGs  Skilled Therapeutic Interventions/Progress Updates:    Bathroom mobility performed with supervision, with RW min cues needed for safety. Gait training x 70', 50' with RW, SpO2 92% on 2L [place] O2 performed with supervision. Cues needed for upright posture and scapular depression. Bed mobility practiced with hospital bed with plan to practiced in ADL apartment. Pt slightly pain focused during session - desires to have motorized wheelchair. Pt encouraged to continue to progress mobility to optimize overall health and avoid dependence on motorized chairs.   Therapy Documentation Precautions:  Precautions Precautions: Fall Restrictions Weight Bearing Restrictions: No Other Position/Activity Restrictions: left knee pain chronic Pain: Pain Assessment Pain Assessment: 0-10 Pain Score:   4 Pain Type: Chronic pain Pain Location: Back Pain Orientation: Lower Pain Descriptors: Aching Pain Onset: On-going Patients Stated Pain Goal: 2 Pain Intervention(s): Repositioned Locomotion : Ambulation Ambulation/Gait Assistance: 5: Supervision Wheelchair Mobility Distance: 50' , Pt required encouragement to perform.    See FIM for current functional status  Therapy/Group: Individual Therapy  [name] 11/03/2011, 12:21 PM

## 2011-11-03 NOTE — Progress Notes (Signed)
Occupational Therapy Session Note  Patient Details  Name: MCKAYLIN BASTIEN MRN: [id number] Date of Birth: Oct 02, 1945  Today's Date: 11/03/2011 Time: 1100-1154 Time Calculation (min): 54 min  Short Term Goals: Week 1:     Skilled Therapeutic Interventions/Progress Updates:    Pt declined bathing and dressing stating that she prefers a female therapist assist her.  Pt stated her husband and daughter could help her after therapy this morning.  Nursing tech notified.  Pt transitioned to ADL apartment to practice tub bench transfers (min A), toilet transfers and toileting (supervision), bed transfers using step stool to step up into bed, furniture transfers, and simple home mgmt tasks using RW.  O2 sats >95% on 2L O2.  Recommended tub bench and BSC.  Pt agreed and SW notified.  Focus on activity tolerance and safety awareness. Therapy Documentation Precautions:  Precautions Precautions: Fall Restrictions Weight Bearing Restrictions: No Other Position/Activity Restrictions: left knee pain chronic   Pain: Pain Assessment Pain Assessment: 0-10 Pain Score:   4 Pain Type: Chronic pain Pain Location: Back Pain Orientation: Lower Pain Descriptors: Aching Pain Onset: On-going Patients Stated Pain Goal: 2 Pain Intervention(s): Repositioned  See FIM for current functional status  Therapy/Group: Individual Therapy  Rich Brave 11/03/2011, 11:57 AM

## 2011-11-03 NOTE — Progress Notes (Signed)
[facility] Individual Statement of Services  Patient Name:  [name]  Date:  11/03/2011  Welcome to the [facility].  Our goal is to provide you with an individualized program based on your diagnosis and situation, designed to meet your specific needs.  With this comprehensive rehabilitation program, you will be expected to participate in at least 3 hours of rehabilitation therapies Monday-Friday, with modified therapy programming on the weekends.  Your rehabilitation program will include the following services:  Physical Therapy (PT), Occupational Therapy (OT), 24 hour per day rehabilitation nursing, Therapeutic Recreaction (TR), Case Management (Social Worker), Rehabilitation Medicine, Nutrition Services and Pharmacy Services  Weekly team conferences will be held on Tuesdays to discuss your progress.  Your  Social Worker will talk with you frequently to get your input and to update you on team discussions.  Team conferences with you and your family in attendance may also be held.  Expected length of stay: 7-10 days  Overall anticipated outcome: supervision to modified independent  Depending on your progress and recovery, your program may change.  Your  Social Worker will coordinate services and will keep you informed of any changes.  Your  Social Worker's name and contact numbers are listed  below.  The following services may also be recommended but are not provided by the [facility]:   Driving Evaluations  Home Health Rehabiltiation Services  Outpatient Rehabilitatation [facility]  Vocational Rehabilitation   Arrangements will be made to provide these services after discharge if needed.  Arrangements include referral to agencies that provide these services.  Your insurance has been verified to be:  [place] Your primary doctor is:  Dr. Jeannetta Nap  Pertinent information will be shared with your doctor and your insurance  company.  Social Worker:  [place], [place] [phone] or [phone]  Information discussed with and copy given to patient by: Amada Jupiter, 11/03/2011, 9:25 AM

## 2011-11-03 NOTE — Progress Notes (Signed)
Patient c/o N/V this afternoon, patient rested and was unable to participate therapy sessions. Zofran given, patient reported medication was effective.  Patient had 2 BM (large and medium) soft, form, brown in the bathroom after senokot.

## 2011-11-03 NOTE — Progress Notes (Signed)
Occupational Therapy Note  Patient Details  Name: [name] MRN: [id number] Date of Birth: October 07, 1945 Today's Date: 11/03/2011  Pt refused 45 mins skilled OT services.  Pt stated she was nauseous and couldn't participate in therapy this afternoon. Discussed the importance of participating and although pt verbalized understanding she also stated she probably wouldn't go to PT later in the afternoon.    Lavone Neri [facility] 11/03/2011, 1:22 PM

## 2011-11-03 NOTE — Progress Notes (Signed)
Physical Therapy Session Note  Patient Details  Name: [name] MRN: [id number] Date of Birth: [birth year]  Today's Date: 11/03/2011 Time: 4098-1191 Time Calculation (min): 15 min  Short Term Goals: Week 1:   =LTGs  Skilled Therapeutic Interventions/Progress Updates:   Discussed home discharge environment including steps and very tall bed. Educated on importance of practicing these to reach level safe to go home. Also educated on how increased mobility can improve bowel movement. Pt declined to get OOB. Nurse tech reports pt has been going to bathroom with husband - educated pt and husband on safety plan and need to call for nursing. Educated on risk associated with decreased oxygen and risk of both getting hurt with a fall. Both verbalized understanding.    Pt missed 45 min skilled PT secondary to refusal to participate, reports she was sick at lunch and does "not feel like doing anything" although does not appear sick. With repeated encouragement and education on need for increased mobility pt became agitated and raised her voice. Pt upset with husband who also encouraged her to participate with therapy.  Therapy Documentation Precautions:  Precautions Precautions: Fall Restrictions Weight Bearing Restrictions: No Other Position/Activity Restrictions: left knee pain chronic Pain:  No specific complaints however no mobility completed.    See FIM for current functional status  Therapy/Group: Individual Therapy  [name] 11/03/2011, 6:07 PM

## 2011-11-03 NOTE — Evaluation (Signed)
Recreational Therapy Assessment and Plan  Patient Details  Name: [name]: [id number] Date of [name]: [birth year] Today's Date: 11/03/2011  Rehab Potential: Good ELOS: 1 week  Assessment Clinical Impression: Problem List:  Patient Active Problem List   Diagnosis   .  HYPOTHYROIDISM   .  HYPOKALEMIA   .  THROMBOCYTOPENIA   .  DEPRESSION   .  AORTIC STENOSIS   .  CONGESTIVE HEART FAILURE UNSPECIFIED   .  TIA   .  VENOUS INSUFFICIENCY   .  EMPHYSEMA, MILD   .  COPD   .  ARTHRITIS   .  Edema   .  CAROTID BRUIT   .  DYSPNEA ON EXERTION   .  Cellulitis   .  PVD (peripheral vascular disease)   .  Spinal stenosis of lumbar region   .  Lower extremity weakness   .  Chronic diastolic CHF (congestive heart failure)   .  Morbid obesity    Past Medical History:  Past Medical History   Diagnosis  Date   .  Carotid bruit      0-39% bilateral ICA by dopplers 03/2010   .  Aortic stenosis      moderate echo 01/2009, then severe 03/2010. low risk myovue 11/2007 brest attenuation EF 63%   .  Venous insufficiency    .  Arthritis    .  Depression    .  COPD (chronic obstructive pulmonary disease)    .  Hypothyroidism    .  Thrombocytopenia    .  Hypokalemia    .  Edema    .  Respiratory failure      Ventilator-dependent respiratory failure secondary to hypercarbia, cardiac arrest, presumed penumonia 12/2008   .  Cardiac arrest      12/2010 in setting of respiratory failure with less than 15 minutes of CPR; initial rhythm was asystole   .  PVD (peripheral vascular disease)      Eval by Dr. Excell Seltzer 2012: noninvasive immaging suggested significant aortoiliac disease, ABIs in mild range - med rx given significant comorbidities   .  H/O endoscopy      For ?ampullary mass - had EGD 07/2010 with no evidence of ampullary mass, some CBD dilitation   .  Chronic pain     Past Surgical History:  Past Surgical History   Procedure  Date   .  Thyroidectomy    .  Laparoscopic  cholecystectomy    .  Lithotripsy      for nephrolithiasis   .  Vaginal hysterectomy      for menorrhagia at age 75   .  Breast cyst excision    .  Rotator cuff repair    .  Cesarean section    .  Laminectomy     Assessment & Plan  Clinical Impression: [name] is a 66 y.o. female with hx of COPD (previous VDRF for hypercarbic respiratory failure in 2010), chronic thrombocytopenia, morbid obesity, and aortic stenosis who is followed by Dr. Eden Emms. She has not previously been felt to be an operative candidate due to comorbidities for her AS, previously noted as severe by notes referring to echo 03/2010. She presented to [facility] On 10/24/11 with complaints of numbness and pain in her bilateral lower extremities and in her back-constant and no relief with rest or positional change. She has been dragging her right leg and unable to stand up. She denied bowel/bladder incontinence. Evaluated by Dr.  Nudleman and MRI spine done revealing progression of severe central canal stenosis at L4-5 and moderate canal stenosis at L5-S1. Surgical decompression recommended pending cardiology work up. She was evaluated by Dr. Daleen Squibb yesterday who felt that patient was at extremely high risk due to multiple medical issues. Treated with IV diuretics for LE edema. Had had complaints of productive cough. CXR with patchy RML infiltrate and was started on avelox 09/04 for possible PNA. Patient transferred to CIR on 10/30/2011 .   Met with pt and discussed leisure interests.  Pt states that she was limited in her ability to participate in leisure activities/community pursuits and spend most of her time in bed watching tv.  Pt states that she enjoys "hand work", Programmer, systems, crafts.  Also states that she goes out to eat with husband and to hair salon ~ 1 x week and plan to get back to that once discharged.  Encouraged pt to continue to be as active as possible within her limitations and stressed the importance of  leisure and how it effects mood.  No formal TR treatment plan implemented at this time.  Will continue to monitor through team.   Leisure History/Participation Premorbid leisure interest/current participation: Crafts - Knitting/Crocheting;Crafts - Other (Comment);[place] - Flower gardening;Nature - Vegetable gardening (out to eat, 1 x week) Other Leisure Interests: Television Leisure Participation Style: Alone;With Family/Friends Awareness of Community Resources: Fair-identify 2 post discharge leisure resources Psychosocial / Spiritual Social interaction - Mood/Behavior: Cooperative Firefighter Appropriate for Education?: Yes Recreational Therapy Orientation Orientation -Reviewed with patient: Available activity resources Strengths/Weaknesses Patient weaknesses: Physical limitations;Minimal Premorbid Leisure Activity  Plan Rec Therapy Plan Is patient appropriate for Therapeutic Recreation?: No   The above assessment, treatment plan, treatment alternatives and goals were discussed and mutually agreed upon: by patient  [name] 11/03/2011, 10:17 AM

## 2011-11-03 NOTE — Plan of Care (Signed)
Problem: Consults Goal: [facility] GENERAL PATIENT EDUCATION See Patient Education module for education specifics.  Outcome: Not Progressing Patient has memory loss and rarely retains information given

## 2011-11-03 NOTE — Progress Notes (Signed)
Social Work  Social Work Assessment and Plan  Patient Details  Name: [name] MRN: [id number] Date of Birth: 05-25-1945  Today's Date: 11/03/2011  Problem List:  Patient Active Problem List  Diagnosis  . HYPOTHYROIDISM  . HYPOKALEMIA  . THROMBOCYTOPENIA  . DEPRESSION  . AORTIC STENOSIS  . CONGESTIVE HEART FAILURE UNSPECIFIED  . TIA  . VENOUS INSUFFICIENCY  . EMPHYSEMA, MILD  . COPD  . ARTHRITIS  . Edema  . CAROTID BRUIT  . DYSPNEA ON EXERTION  . Cellulitis  . PVD (peripheral vascular disease)  . Spinal stenosis of lumbar region  . Lower extremity weakness  . Chronic diastolic CHF (congestive heart failure)  . Morbid obesity   Past Medical History:  Past Medical History  Diagnosis Date  . Carotid bruit     0-39% bilateral ICA by dopplers 03/2010  . Aortic stenosis     moderate echo 01/2009, then severe 03/2010. low risk myovue 11/2007 brest attenuation EF  63%  . Venous insufficiency   . Arthritis   . Depression   . COPD (chronic obstructive pulmonary disease)   . Hypothyroidism   . Thrombocytopenia   . Hypokalemia   . Edema   . Respiratory failure     Ventilator-dependent respiratory failure secondary to hypercarbia, cardiac arrest, presumed penumonia 12/2008  . Cardiac arrest     12/2010 in setting of respiratory failure with less than 15 minutes of CPR; initial rhythm was asystole  . PVD (peripheral vascular disease)     Eval by Dr. Excell Seltzer 2012: noninvasive immaging suggested significant aortoiliac disease, ABIs in mild range  - med rx given significant comorbidities  . H/O endoscopy     For ?ampullary mass - had EGD 07/2010 with no evidence of ampullary mass, some CBD dilitation   . Chronic pain    Past Surgical History:  Past Surgical History  Procedure Date  . Thyroidectomy   . Laparoscopic cholecystectomy   . Lithotripsy      for nephrolithiasis  . Vaginal hysterectomy      for menorrhagia  at age 63  . Breast cyst excision   . Rotator cuff  repair   . Cesarean section   . Laminectomy    Social History:  reports that she has been smoking.  She does not have any smokeless tobacco history on file. Her alcohol and drug histories not on file.  Family / Support Systems Marital Status: Married How Long?: 50 years Patient Roles: Spouse;Parent Spouse/Significant Other: spouse, Alexxia Stankiewicz @ [phone] Children: daughter, Stanton Kidney, in [place] - recently had a TKR Anticipated Caregiver: Husband Ability/Limitations of Caregiver: none Caregiver Availability: 24/7 Family Dynamics: pt describes husband and daughter as very supportive.  Denies any family issues.  Social History Preferred language: English Religion: Quaker Cultural Background: NA Education: HS Read: Yes Write: Yes Employment Status: Retired Date Retired/Disabled/Unemployed: age 34 Legal Hisotry/Current Legal Issues: none Guardian/Conservator: none   Abuse/Neglect Physical Abuse: Denies Verbal Abuse: Denies Sexual Abuse: Denies Exploitation of patient/patient's resources: Denies Self-Neglect: Denies  Emotional Status Pt's affect, behavior adn adjustment status: Pt very pleasant and oriented, however, very lethargic and required prompting from time to time to arouse and answer questions. Pt denies any concerns or s/s of depression or anxiety.  No indication of need for formal depression screening, but wil lmonitor. Recent Psychosocial Issues: None Pyschiatric History: None Substance Abuse History: None  Patient / Family Perceptions, Expectations & Goals Pt/Family understanding of illness & functional limitations: pt and husband with basic  understanding of lumbar stenosis, as well as cardiac issues negating option for surgery at this time.   Premorbid pt/family roles/activities: Pt reports that her husband really takes care of all household needs.   Anticipated changes in roles/activities/participation: little change anticipated as pt has been physically  limited for some time now. Pt/family expectations/goals: "I guess I just want to get stronger"  [employer]: None Premorbid Home Care/DME Agencies: None Transportation available at discharge: yes  Discharge Planning Living Arrangements: Spouse/significant other Support Systems: Spouse/significant other;Children Type of Residence: Private residence Insurance Resources: [employer] (** AARP Medicare) Financial Resources: Restaurant manager, fast food Screen Referred: No Living Expenses: Own Money Management: Spouse Do you have any problems obtaining your medications?: No Home Management: spouse Patient/Family Preliminary Plans: Pt plans to return home with her husband resuming his caregiver role. Social Work Anticipated Follow Up Needs: HH/OP Expected length of stay: 2 weeks  Clinical Impression Pleasant, oriented woman here with myelopathy and lumbar stenosis. Is very lethargic throughout attempted interview.  Able to answer questions appropriately, however, does need prompts occasionally to arouse.  Good support from husband who was providing assistance PTA.  Sharonica Kraszewski 11/03/2011, 2:08 PM

## 2011-11-03 NOTE — Progress Notes (Signed)
Subjective/Complaints: Pain better controlled but frequently sleepy with narcotics A 12 point review of systems has been performed and if not noted above is otherwise negative.   Objective: Vital Signs: Blood pressure 124/44, pulse 73, temperature 97.7 F (36.5 C), temperature source Oral, resp. rate 21, weight 118.2 kg (260 lb 9.3 oz), SpO2 97.00%. No results found.  Basename 11/01/11 0700  WBC 5.6  HGB 12.1  HCT 38.4  PLT 97*    Basename 11/01/11 0700  NA --  K --  CL --  CO2 --  GLUCOSE --  BUN --  CREATININE 0.71  CALCIUM --   CBG (last 3)  No results found for this basename: GLUCAP:3 in the last 72 hours  Wt Readings from Last 3 Encounters:  11/03/11 118.2 kg (260 lb 9.3 oz)  10/29/11 116.03 kg (255 lb 12.8 oz)  10/22/10 93.441 kg (206 lb)    Physical Exam:  Nursing note and vitals reviewed.  Constitutional: She is oriented to person, place, and time. She appears well-developed and well-nourished.  Morbidly obese. Sedated appearing at times (fluctuates) HENT:  Head: Normocephalic and atraumatic.  Eyes: Pupils are equal, round, and reactive to light.  Neck: Normal range of motion. Neck supple.  Cardiovascular: Normal rate and regular rhythm. Exam reveals no S3, no S4 and no friction rub.  Murmur heard.  Systolic murmur is present with a grade of 4/6  Pulmonary/Chest: Effort normal. She has scattered rhonchi, upper airway sounds Abdominal: Bowel sounds are normal. She exhibits no distension. There is no tenderness.  obese  Musculoskeletal: She exhibits edema.  Persistent 2+ edema BLE. kerlix on LLE with drainage. 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  Neurological: She is oriented to person, place, and time.  Follows commands without difficulty.  Skin: Skin is warm and dry.  Stasis changes BLE. She has more erythema in the right lower extremity than on left side  Sensation is reduced in bilateral feet to light touch.  Motor strength is 5/5 in bilateral  deltoid, biceps, triceps, grip  4/5 in bilateral knee extensors 3 minus in hip flexors 2 minus in ankle dorsiflexors and plantar flexors  Assessment/Plan: 1. Functional deficits secondary to lumbar stenosis/ severe AS which require 3+ hours per day of interdisciplinary therapy in a comprehensive inpatient rehab setting. Physiatrist is providing close team supervision and 24 hour management of active medical problems listed below. Physiatrist and rehab team continue to assess barriers to discharge/monitor patient progress toward functional and medical goals. FIM: FIM - Bathing Bathing Steps Patient Completed: Chest;Right Arm;Left Arm;Abdomen;Front perineal area;Right upper leg;Left upper leg Bathing: 3: Mod-Patient completes 5-7 52f 10 parts or 50-74%  FIM - Upper Body Dressing/Undressing Upper body dressing/undressing steps patient completed: Thread/unthread right sleeve of pullover shirt/dresss;Thread/unthread left sleeve of pullover shirt/dress;Put head through opening of pull over shirt/dress;Pull shirt over trunk Upper body dressing/undressing: 6: More than reasonable amount of time FIM - Lower Body Dressing/Undressing Lower body dressing/undressing steps patient completed: Pull pants up/down;Don/Doff left sock Lower body dressing/undressing: 2: Max-Patient completed 25-49% of tasks  FIM - Toileting Toileting steps completed by patient: Adjust clothing prior to toileting;Performs perineal hygiene;Adjust clothing after toileting Toileting: 5: Supervision: Safety issues/verbal cues  FIM - Diplomatic Services operational officer Devices: Bedside commode Toilet Transfers: 5-To toilet/BSC: Supervision (verbal cues/safety issues)  FIM - Games developer Transfer: 4: Chair or W/C > Bed: Min A (steadying Pt. > 75%);4: Bed > Chair or W/C: Min A (steadying Pt. > 75%)  FIM - Locomotion: Wheelchair  Distance: 31' Locomotion: Wheelchair: 1: Travels less than 50 ft with minimal  assistance (Pt.>75%) FIM - Locomotion: Ambulation Locomotion: Ambulation Assistive Devices: Designer, industrial/product Ambulation/Gait Assistance: 4: Min assist Locomotion: Ambulation: 1: Travels less than 50 ft with minimal assistance (Pt.>75%)  Comprehension Comprehension Mode: Auditory Comprehension: 6-Follows complex conversation/direction: With extra time/assistive device  Expression Expression Mode: Verbal Expression: 6-Expresses complex ideas: With extra time/assistive device  Social Interaction Social Interaction: 6-Interacts appropriately with others with medication or extra time (anti-anxiety, antidepressant).  Problem Solving Problem Solving: 6-Solves complex problems: With extra time  Memory Memory: 6-More than reasonable amt of time  Medical Problem List and Plan:  1. DVT Prophylaxis/Anticoagulation: Pharmaceutical: Lovenox  2. Pain Management: continue home dose MS contin 120 mg bid with MSIR 15 mg q 4 hrs prn.   -pain typically worst in the am per pt.   -pt back on 120mg  ms contin q12 3. Mood: continue prozac for chronic depression.  4. Neuropsych: This patient is capable of making decisions on his/her own behalf.  5. Morbid obesity: Pressure relief measures. Elevate BLE when in bed/chair due to severe peripheral edema.  6. Chronic CHF/Severe AS: Monitor daily weights. Low salt diet. Will have dietitian educate patient on low salt diet (two bags of lite chips in front of patient).   Lasix 40 mg bid (changed to po) and metoprolol 25 mg daily.   -don't think we will be able to wean oxygen  -weights trending up a bit.   -recheck cxr and increase lasix 7. COPD: ongoing tobacco use.  8. PNA?: avelox D#4. Will add nebs to help with symtoms. Follow up CXR stable to improved  -IS  -robitussin LOS (Days) 4 A FACE TO FACE EVALUATION WAS PERFORMED  Genowefa Morga T 11/03/2011, 9:09 AM

## 2011-11-04 ENCOUNTER — Inpatient Hospital Stay (HOSPITAL_COMMUNITY): Payer: Medicare Other | Admitting: Occupational Therapy

## 2011-11-04 ENCOUNTER — Ambulatory Visit (HOSPITAL_COMMUNITY): Payer: Medicare Other

## 2011-11-04 ENCOUNTER — Encounter (HOSPITAL_COMMUNITY): Payer: Self-pay | Admitting: Physician Assistant

## 2011-11-04 ENCOUNTER — Inpatient Hospital Stay (HOSPITAL_COMMUNITY): Payer: Medicare Other

## 2011-11-04 ENCOUNTER — Inpatient Hospital Stay (HOSPITAL_COMMUNITY): Payer: Medicare Other | Admitting: Physical Therapy

## 2011-11-04 DIAGNOSIS — M48061 Spinal stenosis, lumbar region without neurogenic claudication: Secondary | ICD-10-CM

## 2011-11-04 DIAGNOSIS — I359 Nonrheumatic aortic valve disorder, unspecified: Secondary | ICD-10-CM

## 2011-11-04 DIAGNOSIS — Z5189 Encounter for other specified aftercare: Secondary | ICD-10-CM

## 2011-11-04 DIAGNOSIS — I5033 Acute on chronic diastolic (congestive) heart failure: Secondary | ICD-10-CM

## 2011-11-04 DIAGNOSIS — I509 Heart failure, unspecified: Secondary | ICD-10-CM

## 2011-11-04 MED ORDER — POTASSIUM CHLORIDE CRYS ER 20 MEQ PO TBCR
20.0000 meq | EXTENDED_RELEASE_TABLET | Freq: Two times a day (BID) | ORAL | Status: DC
  Administered 2011-11-04 – 2011-11-07 (×6): 20 meq via ORAL
  Filled 2011-11-04 (×7): qty 1

## 2011-11-04 MED ORDER — FUROSEMIDE 80 MG PO TABS
80.0000 mg | ORAL_TABLET | Freq: Three times a day (TID) | ORAL | Status: DC
  Administered 2011-11-04 – 2011-11-07 (×10): 80 mg via ORAL
  Filled 2011-11-04 (×13): qty 1

## 2011-11-04 NOTE — Progress Notes (Addendum)
Physical Therapy Session Note  Patient Details  Name: [name] MRN: [id number] Date of Birth: Feb 09, 1946  Today's Date: 11/04/2011 Time: 1300-1329 Time Calculation (min): 29 min  Short Term Goals: Week 1:   = LTGs  Skilled Therapeutic Interventions/Progress Updates:    Improved participation today - husband not present. Gait x 60' with supervision and cues for posture and encouragement for increased distance. Wheelchair mobility x 50', supervision. Stair training x 4 steps with one rail, min-guard assist. Pt requires repeated instruction for safety and encouragement for participation at times, will likely benefit from supervision with mobility.    Updated goals due to decreased participation with therapies, slow progress and motivation at times.   Therapy Documentation Precautions:  Precautions Precautions: Fall Restrictions Weight Bearing Restrictions: No Other Position/Activity Restrictions: left knee pain chronic General: Amount of Missed PT Time (min): 60 Minutes Missed Time Reason: Patient unwilling/refused to participate without medical reason Pain: Pain Assessment Pain Assessment: 0-10 Pain Score:   8 Pain Type: Chronic pain Pain Location: Knee Pain Orientation: Left Pain Descriptors: Aching Pain Onset: With Activity Pain Intervention(s): Repositioned;Other (Comment) (rest as needed, premedicated) Locomotion : Ambulation Ambulation/Gait Assistance: 5: Supervision   See FIM for current functional status  Therapy/Group: Individual Therapy  [name] 11/04/2011, 2:31 PM

## 2011-11-04 NOTE — Progress Notes (Signed)
Sputum obtained/sent to Lab

## 2011-11-04 NOTE — Progress Notes (Signed)
Occupational Therapy Note  Patient Details  Name: [name] MRN: [id number] Date of Birth: 08-09-1945 Today's Date: 11/04/2011  Time: 1345-1430 Pt c/o 7/10 pain in BLE and lower back; RN aware and repositioned Individual Therapy  Pt agreeable to practicing bed transfers and tub transfer bench transfers.  Pt amb with RW to complete all tasks in ADL apartment.  Husband present and provided supervision for pt to amb with RW to bathroom for toileting.  Pt completed all tasks as supervision level.  Recommended tub transfer bench for use at home.  Husband agreeable and stated that it would work better than seat they currently have.   Lavone Neri [facility] 11/04/2011, 3:34 PM

## 2011-11-04 NOTE — Progress Notes (Signed)
Subjective/Complaints: Pain better controlled but frequently sleepy with narcotics. Breathing ok, but still limiting at times. A 12 point review of systems has been performed and if not noted above is otherwise negative.   Objective: Vital Signs: Blood pressure 120/61, pulse 72, temperature 98.1 F (36.7 C), temperature source Oral, resp. rate 19, weight 118.2 kg (260 lb 9.3 oz), SpO2 100.00%. Dg Chest 2 View  11/03/2011  *RADIOLOGY REPORT*  Clinical Data: Short of breath  CHEST - 2 VIEW  Comparison: 10/31/2011  Findings: Diffuse vascular congestion without definite edema is worse.  Increasing bibasilar atelectasis.  Borderline cardiomegaly. No pneumothorax.  Increased AP diameter the chest is again noted. Stable thoracic spine.  IMPRESSION: Vascular congestion is worse.  No definitive edema.   Original Report Authenticated By: Donavan Burnet, M.D.    No results found for this basename: WBC:2,HGB:2,HCT:2,PLT:2 in the last 72 hours No results found for this basename: NA:2,K:2,CL:2,CO2:2,GLUCOSE:2,BUN:2,CREATININE:2,CALCIUM:2 in the last 72 hours CBG (last 3)  No results found for this basename: GLUCAP:3 in the last 72 hours  Wt Readings from Last 3 Encounters:  11/03/11 118.2 kg (260 lb 9.3 oz)  10/29/11 116.03 kg (255 lb 12.8 oz)  10/22/10 93.441 kg (206 lb)    Physical Exam:  Nursing note and vitals reviewed.  Constitutional: She is oriented to person, place, and time. She appears well-developed and well-nourished.  Morbidly obese. Sedated appearing at times (fluctuates) HENT:  Head: Normocephalic and atraumatic.  Eyes: Pupils are equal, round, and reactive to light.  Neck: Normal range of motion. Neck supple.  Cardiovascular: Normal rate and regular rhythm. Exam reveals no S3, no S4 and no friction rub.  Murmur heard.  Systolic murmur is present with a grade of 4/6  Pulmonary/Chest: Effort normal. She has scattered rhonchi, upper airway sounds Abdominal: Bowel sounds are normal.  She exhibits no distension. There is no tenderness.  obese  Musculoskeletal: She exhibits edema.  Persistent 2+ edema BLE. kerlix on LLE with drainage. 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  Neurological: She is oriented to person, place, and time.  Follows commands without difficulty.  Skin: Skin is warm and dry.  Stasis changes BLE. She has more erythema in the right lower extremity than on left side  Sensation is reduced in bilateral feet to light touch.  Motor strength is 5/5 in bilateral deltoid, biceps, triceps, grip  4/5 in bilateral knee extensors 3 minus in hip flexors 2 minus in ankle dorsiflexors and plantar flexors  Assessment/Plan: 1. Functional deficits secondary to lumbar stenosis/ severe AS which require 3+ hours per day of interdisciplinary therapy in a comprehensive inpatient rehab setting. Physiatrist is providing close team supervision and 24 hour management of active medical problems listed below. Physiatrist and rehab team continue to assess barriers to discharge/monitor patient progress toward functional and medical goals. FIM: FIM - Bathing Bathing Steps Patient Completed: Chest;Right Arm;Left Arm;Abdomen;Front perineal area;Right upper leg;Left upper leg Bathing: 3: Mod-Patient completes 5-7 88f 10 parts or 50-74%  FIM - Upper Body Dressing/Undressing Upper body dressing/undressing steps patient completed: Thread/unthread right sleeve of pullover shirt/dresss;Thread/unthread left sleeve of pullover shirt/dress;Put head through opening of pull over shirt/dress;Pull shirt over trunk Upper body dressing/undressing: 6: More than reasonable amount of time FIM - Lower Body Dressing/Undressing Lower body dressing/undressing steps patient completed: Pull pants up/down;Don/Doff left sock Lower body dressing/undressing: 2: Max-Patient completed 25-49% of tasks  FIM - Toileting Toileting steps completed by patient: Adjust clothing prior to toileting;Adjust clothing after  toileting;Performs perineal hygiene Toileting: 5:  Supervision: Safety issues/verbal cues  FIM - Diplomatic Services operational officer Devices: Walker;Elevated toilet seat Toilet Transfers: 5-To toilet/BSC: Supervision (verbal cues/safety issues)  FIM - Games developer Transfer: 4: Chair or W/C > Bed: Min A (steadying Pt. > 75%);4: Bed > Chair or W/C: Min A (steadying Pt. > 75%)  FIM - Locomotion: Wheelchair Distance: 50' Locomotion: Wheelchair: 2: Travels 50 - 149 ft with supervision, cueing or coaxing FIM - Locomotion: Ambulation Locomotion: Ambulation Assistive Devices: Designer, industrial/product Ambulation/Gait Assistance: 5: Supervision Locomotion: Ambulation: 2: Travels 50 - 149 ft with supervision/safety issues  Comprehension Comprehension Mode: Auditory Comprehension: 6-Follows complex conversation/direction: With extra time/assistive device  Expression Expression Mode: Verbal Expression: 6-Expresses complex ideas: With extra time/assistive device  Social Interaction Social Interaction: 6-Interacts appropriately with others with medication or extra time (anti-anxiety, antidepressant).  Problem Solving Problem Solving: 6-Solves complex problems: With extra time  Memory Memory: 6-More than reasonable amt of time  Medical Problem List and Plan:  1. DVT Prophylaxis/Anticoagulation: Pharmaceutical: Lovenox  2. Pain Management: continue home dose MS contin 120 mg bid with MSIR 15 mg q 4 hrs prn.   -pain typically worst in the am per pt.   -pt back on 120mg  ms contin q12 3. Mood: continue prozac for chronic depression.  4. Neuropsych: This patient is capable of making decisions on his/her own behalf.  5. Morbid obesity: Pressure relief measures. Elevate BLE when in bed/chair due to severe peripheral edema.  6. Chronic CHF/Severe AS: Monitor daily weights. Low salt diet. Will have dietitian educate patient on low salt diet (two bags of lite chips in front of  patient).   Lasix 40 mg bid (changed to po) and metoprolol 25 mg daily.   -don't think we will be able to wean oxygen  -weights trending up a bit.   -will increase lasix to 80mg  TID, watching labs closely  -recheck CXR in the am  -will ask cards to follow up also 7. COPD: ongoing tobacco use.  8. PNA?: avelox D#5 Will add nebs to help with symtoms. Follow up CXR stable to improved  -IS  -robitussin LOS (Days) 5 A FACE TO FACE EVALUATION WAS PERFORMED  Nikoloz Huy T 11/04/2011, 8:49 AM

## 2011-11-04 NOTE — Progress Notes (Signed)
Occupational Therapy Session Note  Patient Details  Name: [name] MRN: [id number] Date of Birth: 31-Aug-1945  Today's Date: 11/04/2011 Time: 1000-1055 Time Calculation (min): 55 min  Skilled Therapeutic Interventions/Progress Updates:  Patient found seated in recliner with 6/10 pain, RN aware. Patient refused recommended shower but willing to perform ADL at sink level. Patient ambulated from recliner -> sink for ADL retraining. Patient performed UB/LB bathing and dressing in sit -> stand position. Focused skilled intervention on overall activity tolerance/endurance, energy conservation prn, toilet transfer, toileting, functional ambulation using rolling walker, and grooming tasks seated at sink in w/c. At end of session left patient seated in recliner with call bell & phone within reach.   Precautions:  Precautions Precautions: Fall Restrictions Weight Bearing Restrictions: No Other Position/Activity Restrictions: left knee pain chronic  See FIM for current functional status  Therapy/Group: Individual Therapy  Devin Ganaway 11/04/2011, 12:03 PM

## 2011-11-04 NOTE — Progress Notes (Signed)
Physical Therapy Note  Patient Details  Name: [name] MRN: [id number] Date of Birth: May 07, 1945 Today's Date: 11/04/2011  Patient refused to participate in walking group. Patient missed 60 minutes of physical therapy.   Arelia Longest M 11/04/2011, 10:34 AM

## 2011-11-04 NOTE — Progress Notes (Signed)
Sputum collection device placed in room explained to pt.[name]

## 2011-11-04 NOTE — Consult Note (Signed)
CARDIOLOGY CONSULT NOTE  Patient ID: [name], MRN: [id number], DOB/AGE: Nov 27, 1945 66 y.o. Admit date: 10/30/2011   Date of Consult: 11/04/2011 Primary Physician: [name], MD  Primary Cardiologist: Dr. Eden Emms   Chief Complaint: unable to walk  Reason for Consult: CHF  HPI: [name] is a 66 y/o F with hx of COPD with previous VDRF in 2010, chronic thrombocytopenia, PVD, chronic diastolic CHF, RLE cellulitis, and aortic stenosis who is followed by Dr. Eden Emms. She has not previously been felt to be an operative candidate due to comorbidities for her AS. She was admitted to [facility] 8/30 and recently transferred to inpatient rehab on 10/30/11. She presented initially to [facility] with complaints of numbness and pain in her bilateral lower extremities and in her back. She was seen by neurosurgery given significant lumbar stenosis but due to multiple comorbidities she was felt to be a poor candidate for surgical intervention. 2D echo 10/25/11 demonstrated EF 60-65%, grade 2 diastolic dysfunction, severe AS with mean gradient , mild MR. While admitted as inpatient, she did require transient IV Lasix. CXR demonstrated mild infiltrate and she was started on antibiotics. She was transferred to intpt rehab on 10/30/11 on Lasix 40mg  po BID. While on rehab, she has required supplemental oxygen. Yesterday her PT treatments were limited by nausea/vomiting. We are asked to see regarding CHF. Primary team has recommended increase in Lasix to 80mg  TID today. CXR yesterday showed worsened vascular congestion without definite edema. BP controlled. Weight yesterday was up 4lbs from 9/5. Last labs 9/6: BUN/Cr 19/0.88, CO2 ?36, albumin 3, H/H WNL, Plt 97.  Her major complaint is dyspnea and productive cough of yellow/green phlegm. Started using incentive spirometry yesterday. Afebrile. No rigors but does note she feels chilly. Has chronic LEE that is actually better for her today. Does not sleep lying  flat due to back pain, but in the past has only ever preferred lying somewhat reclined.  Past Medical History  Diagnosis Date  . Carotid bruit     0-39% bilateral ICA by dopplers 03/2010  . Aortic stenosis     a. Moderate echo 01/2009, then severe 03/2010 --> last echo 09/2011.  b. Not an operative candidate due to comorbidities, not a candidate for TAVR given PVD. c. low risk myovue 11/2007 brest attenuation EF  63%  . Venous insufficiency   . Arthritis   . Depression   . COPD (chronic obstructive pulmonary disease)   . Hypothyroidism   . Thrombocytopenia   . Hypokalemia   . Edema   . Respiratory failure     Ventilator-dependent respiratory failure secondary to hypercarbia, cardiac arrest, presumed penumonia 12/2008  . Cardiac arrest     12/2010 in setting of respiratory failure with less than 15 minutes of CPR; initial rhythm was asystole  . PVD (peripheral vascular disease)     Eval by Dr. Excell Seltzer 2012: noninvasive immaging suggested significant aortoiliac disease, ABIs in mild range  - med rx given significant comorbidities  . H/O endoscopy     For ?ampullary mass - had EGD 07/2010 with no evidence of ampullary mass, some CBD dilitation   . Chronic pain   . Lumbar stenosis     Admitted 09/2011 - not felt to be a good candidate for surgical intervention.      Most Recent Cardiac Studies: Per Dr. Fabio Bering office note 06/2010 (unable to locate paper copy of echo)  "I reviewed her echo from 2/12 and she has severe AS. Not an operative  candidate due to comorbidities. NO CAD on cath 12/11. Not TAVR candidate due to PVD"  Per OV 03/2010:" Reviewed echo from today: Mean gradient 41 Pk 75 AVA by VTI 1.26 "  2D echo 10/25/11 Study Conclusions - Left ventricle: The cavity size was normal. Systolic function was normal. The estimated ejection fraction was in the range of 60% to 65%. Wall motion was normal; there were no regional wall motion abnormalities. Features are consistent with a pseudonormal  left ventricular filling pattern, with concomitant abnormal relaxation and increased filling pressure (grade 2 diastolic dysfunction). Doppler parameters are consistent with elevated ventricular end-diastolic filling pressure. - Aortic valve: There was severe stenosis. Mean gradient: 50mm Hg (S). Peak gradient: 91mm Hg (S). Valve area: 0.94cm^2(VTI). Valve area: 0.77cm^2 (Vmax). - Mitral valve: Leaflet separation was reduced. Mobility was restricted. Mild regurgitation. Mean gradient: 6mm Hg (D). Valve area by pressure half-time: 2.12cm^2. Valve area by continuity equation (using LVOT flow): 1.45cm^2.  - Left atrium: The atrium was moderately dilated. Cardiac Cath 01/2009  CONCLUSION:  1. Normal coronary angiography.  2. Moderate aortic stenosis with a mean aortic valve gradient of 21  mmHg and a calculated aortic valve area of 0.9 to 1.2 cm2.  3. Evidence of diastolic dysfunction and diastolic heart failure with  an LVEDP of 33 and a pulmonary wedge of 22 to 31.  4. Pulmonary vascular disease with elevated pulmonary vascular  resistance of 3.6 associated with known COPD.    Surgical History:  Past Surgical History  Procedure Date  . Thyroidectomy   . Laparoscopic cholecystectomy   . Lithotripsy      for nephrolithiasis  . Vaginal hysterectomy      for menorrhagia  at age 46  . Breast cyst excision   . Rotator cuff repair   . Cesarean section   . Laminectomy      Home Meds: Prior to Admission medications   Medication Sig Start Date End Date Taking? Authorizing Provider  FLUoxetine (PROZAC) 20 MG capsule Take 20 mg by mouth daily.      Historical Provider, MD  levothyroxine (SYNTHROID, LEVOTHROID) 137 MCG tablet Take 137 mcg by mouth daily.    Historical Provider, MD  metoprolol succinate (TOPROL-XL) 25 MG 24 hr tablet Take 25 mg by mouth daily.    Historical Provider, MD  morphine (MS CONTIN) 60 MG 12 hr tablet Take 120 mg by mouth 2 (two) times daily.    Historical Provider, MD    morphine (MSIR) 15 MG tablet Take 15 mg by mouth every 4 (four) hours as needed. pain    Historical Provider, MD  potassium chloride (KLOR-CON) 10 MEQ CR tablet Take 20 mEq by mouth daily. 2 tabs po qam and 1 po qhs    Historical Provider, MD    Inpatient Medications:     . dextromethorphan-guaiFENesin  1 tablet Oral BID  . enoxaparin  40 mg Subcutaneous Q24H  . famotidine  20 mg Oral Daily  . FLUoxetine  20 mg Oral Daily  . furosemide  80 mg Oral TID  . levothyroxine  137 mcg Oral Q0600  . metoprolol succinate  25 mg Oral Daily  . morphine  120 mg Oral BID  . moxifloxacin  400 mg Oral Q2000  . nicotine  14 mg Transdermal Daily  . potassium chloride  20 mEq Oral Daily  . senna-docusate  4 tablet Oral BID  . DISCONTD: furosemide  40 mg Oral TID    Allergies:  Allergies  Allergen Reactions  .  Latex     REACTION: itch  . Penicillins     REACTION: hives    History   Social History  . Marital Status: Married    Spouse Name: N/A    Number of Children: N/A  . Years of Education: N/A   Occupational History  . Not on file.   Social History Main Topics  . Smoking status: Current Everyday Smoker -- 1.5 packs/day for 45 years  . Smokeless tobacco: Not on file  . Alcohol Use: Not on file  . Drug Use: Not on file  . Sexually Active: Not on file   Other Topics Concern  . Not on file   Social History Narrative   pt is married.pt has children.pt is a housewife.      Family History  Problem Relation Age of Onset  . Emphysema Father   . Allergies Sister   . Asthma Sister   . Heart disease Father   . Cancer Mother      Review of Systems: General: negative for chills, fever, night sweats or weight changes.  Cardiovascular: see above  Dermatological: negative for rash  Respiratory: negative for cough. occ wheezing  Urologic: negative for hematuria  Abdominal: negative for nausea, vomiting, diarrhea, bright red blood per rectum, melena, or hematemesis  Neurologic:  negative for visual changes, syncope, or dizziness  All other systems reviewed and are otherwise negative except as noted above. Back pain noted as above.  Labs:  Lab Results  Component Value Date   WBC 5.6 11/01/2011   HGB 12.1 11/01/2011   HCT 38.4 11/01/2011   MCV 91.6 11/01/2011   PLT 97* 11/01/2011     Lab 11/01/11 0700 10/31/11 0738  NA -- 140  K -- 3.9  CL -- 99  CO2 -- 36*  BUN -- 19  CREATININE 0.71 --  CALCIUM -- 8.8  PROT -- 6.5  BILITOT -- 0.6  ALKPHOS -- 178*  ALT -- 27  AST -- 53*  GLUCOSE -- 129*    Radiology/Studies:  Dg Chest 2 View 11/03/2011  *RADIOLOGY REPORT*  Clinical Data: Short of breath  CHEST - 2 VIEW  Comparison: 10/31/2011  Findings: Diffuse vascular congestion without definite edema is worse.  Increasing bibasilar atelectasis.  Borderline cardiomegaly. No pneumothorax.  Increased AP diameter the chest is again noted. Stable thoracic spine.  IMPRESSION: Vascular congestion is worse.  No definitive edema.   Original Report Authenticated By: Donavan Burnet, M.D.    Dg Chest 2 View 10/31/2011  *RADIOLOGY REPORT*  Clinical Data: Follow up right middle lobe effusion.  CHEST - 2 VIEW  Comparison: 10/28/2011.  Findings: Improving aeration at the right lung base.  Small areas of atelectasis are present at the costophrenic angles bilaterally. No effusion.  Cardiopericardial silhouette is mildly enlarged. Right rotator cuff repair anchors.  IMPRESSION: Improving small focus of airspace disease in the right middle lobe. Development of mild bilateral basilar atelectasis.   Original Report Authenticated By: Andreas Newport, M.D.    Dg Chest 2 View 10/28/2011  *RADIOLOGY REPORT*  Clinical Data: Productive cough.  CHEST - 2 VIEW  Comparison: 06/04/2010 and 07/19/2009  Findings: There is mild chronic cardiomegaly.  Pulmonary vascularity is normal.  There is a small patchy area of density, probably in the right middle lobe best seen on the lateral view, which is new since 07/19/2009.   Lungs are otherwise clear except for peribronchial thickening.  No effusions.  IMPRESSION: 1.  Small patchy infiltrate in the right middle  lobe. 2.  Bronchitic changes.   Original Report Authenticated By: Gwynn Burly, M.D.    Mr Lumbar Spine Wo Contrast 10/24/2011  *RADIOLOGY REPORT*  Clinical Data: Lower extremity weakness.  Severe low back pain.  MRI LUMBAR SPINE WITHOUT CONTRAST  Technique:  Multiplanar and multiecho pulse sequences of the lumbar spine were obtained without intravenous contrast.  Comparison: MRI of the lumbar spine 05/19/2009.  Findings: Normal signal is present in the conus medullaris which terminates at L1-2, within normal limits.  Rightward curvature of the lumbar spine is centered at L3 without significant interval change.  Chronic end plate marrow changes are present at T11-12, L1- 2, L4-5, and L5-S1.  Vertebral body heights and AP alignment are maintained.  Limited imaging of the abdomen is unremarkable.  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  L1-2:  Mild leftward disc bulging is present.  Moderate facet hypertrophy is present bilaterally.  No significant stenosis is present.  L2-3:  A leftward disc herniation is present.  Moderate facet hypertrophy is evident.  The left lateral recess narrowing has progressed.  There is mild encroachment into the foramina bilaterally without significant stenosis.  L3-4:  A broad-based disc herniation is present.  Moderate facet hypertrophy is present.  There is slight progression of mild central canal stenosis with left greater than right lateral recess narrowing.  The disc extends into the inferior recess of both neural foramina without significant stenosis.  L4-5:  A broad-based disc herniation is present.  Advanced facet hypertrophy is present.  There is progression of severe central canal stenosis.  Mild foraminal narrowing is present bilaterally.  L5-S1:  A broad-based disc herniation is present.  Moderate facet  hypertrophy is evident.  This results in moderate right and mild left foraminal narrowing, slightly worse than on the prior exam.  IMPRESSION:  1.  Progression of severe central canal stenosis at L4-5. 2.  Progression of moderate right and mild left foraminal stenosis at L5-S1. 3.  Mild foraminal narrowing bilaterally at L4-5 is stable. 4.  Progression of mild central canal narrowing at L3-4 with left greater than right lateral recess narrowing. 5.  Progression of left lateral recess narrowing at L2-3.  6.  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   Original Report Authenticated By: Jamesetta Orleans. MATTERN, M.D.    Dg Knee Complete 4 Views Left 10/24/2011  *RADIOLOGY REPORT*  Clinical Data: History of pain and swelling involving the anterior and posterior aspects of the left knee with no known injury.  LEFT KNEE - COMPLETE 4+ VIEW  Comparison: None.  Findings: There is obesity.  There is osteopenic appearance of bones.  There is narrowing of the medial joint space.  There is minimal patellar spurring.  There is slight fullness in the suprapatellar region on lateral examination which may reflect a small amount of joint effusion.  No chondrocalcinosis or opaque loose body is evident.  No fracture or dislocation is evident.  IMPRESSION: Obesity.  Osteopenic appearance of bones.  Narrowing of medial joint space.  Patellar spurring.  Question small joint effusion.   Original Report Authenticated By: Crawford Givens, M.D.    EKG: 9/2: NSR 76bpm, LVH, left axis deviation, no acute changes  Physical Exam: Blood pressure 120/61, pulse 72, temperature 98.1 F (36.7 C), temperature source Oral, resp. rate 19, weight 260 lb 9.3 oz (118.2 kg), SpO2 100.00%. General: Well developed obese WF in no acute distress. Head: Normocephalic, atraumatic, sclera non-icteric, no xanthomas, nares are without discharge.  Neck: Positive bilateral carotid bruits. JVD not elevated. Lungs: Coarse rhonchorous  breath sounds at bases bilaterally. Mod air movement. Faint expiratory wheezes 1/2 way up. Breathing is unlabored. Heart: RRR. Significant 3/6 SEM RUSB, also has 2/6 SEM apex. Unable to fully auscultate S2 clearly. No rubs or gallops appreciated. Abdomen: Soft, non-tender, obese non-distended with normoactive bowel sounds. No hepatomegaly. No rebound/guarding. No obvious abdominal masses. Msk:  Strength and tone appear normal for age. Extremities: No clubbing or cyanosis. 1-2+ LE edema with reddening of the skin on RLE.  Distal pedal pulses are diminished but equal bilaterally. Neuro: Alert and oriented X 3. Moves all extremities spontaneously. Psych:  Responds to questions appropriately with a normal affect.   Assessment and Plan:   1. Bilateral LE weakness felt multifactorial but in part due to spinal stenosis, not felt to be a surgical candidate 2. Acute on chronic respiratory failure 3. Recent PNA as inpatient 4. Acute on chronic diastolic CHF 5. Severe aortic stenosis, managed medically 6. Chronic thrombocytopenia  7. Morbid obesity (BMI 52.2)  8. COPD with history of hypercarbic VDRF/cardiac arrest 2010  9. PVD  Agree that patient has some volume on board with weight up 4 lbs. There also appears to be a pulmonary component given her description of phlegm and auscultated lung exam. She has been treated with antibiotics but ?COPD exacerbation. May need pulm eval while on rehab. Continue incentive spirometry. Check sputum cx. Agree with current increased dose of Lasix. Increase K to po BID while on higher dose of Lasix. Would be careful with diuresis given her aortic valve disease and follow BP, I&Os, daily weights. BP is well controlled at present. BMET ordered for AM.  Signed, Ronie Spies PA-C 11/04/2011, 10:26 AM   I have personally seen and examined this patient with Ronie Spies, PA-C. I agree with the assessment and plan as outlined above. Agree with increased diuresis. Follow  renal function closely. Would not diurese past dry weight given her severe aortic valve stenosis.   MCALHANY,CHRISTOPHER 11:36 AM 11/04/2011

## 2011-11-05 ENCOUNTER — Inpatient Hospital Stay (HOSPITAL_COMMUNITY): Payer: Medicare Other | Admitting: Physical Therapy

## 2011-11-05 ENCOUNTER — Inpatient Hospital Stay (HOSPITAL_COMMUNITY): Payer: Medicare Other

## 2011-11-05 ENCOUNTER — Inpatient Hospital Stay (HOSPITAL_COMMUNITY): Payer: Medicare Other | Admitting: Occupational Therapy

## 2011-11-05 DIAGNOSIS — Z5189 Encounter for other specified aftercare: Secondary | ICD-10-CM

## 2011-11-05 DIAGNOSIS — M48061 Spinal stenosis, lumbar region without neurogenic claudication: Secondary | ICD-10-CM

## 2011-11-05 LAB — EXPECTORATED SPUTUM ASSESSMENT W GRAM STAIN, RFLX TO RESP C

## 2011-11-05 LAB — BASIC METABOLIC PANEL
BUN: 14 mg/dL (ref 6–23)
Calcium: 8.5 mg/dL (ref 8.4–10.5)
Chloride: 98 mEq/L (ref 96–112)
Creatinine, Ser: 0.92 mg/dL (ref 0.50–1.10)
GFR calc Af Amer: 74 mL/min — ABNORMAL LOW (ref >90)
GFR calc non Af Amer: 64 mL/min — ABNORMAL LOW (ref >90)

## 2011-11-05 NOTE — Progress Notes (Signed)
Patient ID: [name], female   DOB: 18-Apr-1945, 66 y.o.   MRN: [id number]    Subjective:  Denies SSCP, palpitations or Dyspnea Legs are chronic problem  Especially the right  Objective:  Filed Vitals:   11/04/11 0500 11/04/11 1500 11/05/11 0530 11/05/11 0555  BP: 120/61 109/54 164/54   Pulse: 72 62 71   Temp: 98.1 F (36.7 C) 98.1 F (36.7 C) 98.3 F (36.8 C)   TempSrc: Oral Oral Oral   Resp: 19 19 20    Weight:    254 lb 3.1 oz (115.3 kg)  SpO2: 100% 99% 98%     Intake/Output from previous day:  Intake/Output Summary (Last 24 hours) at 11/05/11 0849 Last data filed at 11/05/11 0532  Gross per 24 hour  Intake   1680 ml  Output   2750 ml  Net  -1070 ml    Physical Exam: Affect appropriate Elderly obese female HEENT: normal Neck supple with no adenopathy JVP normal no bruits no thyromegaly Lungs clear with no wheezing and good diaphragmatic motion Heart:  S1/S2 AS murmur, no rub, gallop or click PMI normal Abdomen: benighn, BS positve, no tenderness, no AAA no bruit.  No HSM or HJR Distal pulses intact with no bruits Plus 3 edema on right with erythema and plus 2 on left Neuro non-focal Skin warm and dry No muscular weakness   Lab Results: Basic Metabolic Panel:  Basename 11/05/11 0620  NA 143  K 3.8  CL 98  CO2 39*  GLUCOSE 137*  BUN 14  CREATININE 0.92  CALCIUM 8.5  MG --  PHOS --     Imaging: Dg Chest 2 View  11/05/2011  *RADIOLOGY REPORT*  Clinical Data: Shortness of breath, cough  CHEST - 2 VIEW  Comparison: Chest x-ray of 11/03/2011, 10/28/2011 and 06/04/2010  Findings: The apparent pulmonary vascular congestion has not changed significantly.  This may be superimposed on mild chronic interstitial change.  Cardiomegaly is stable.  No effusion is seen. The bones are osteopenic and there are degenerative changes throughout the thoracic spine.  IMPRESSION: Little change in probable pulmonary vascular congestion, possibly superimposed on chronic  interstitial change.   Original Report Authenticated By: Juline Patch, M.D.    Dg Chest 2 View  11/03/2011  *RADIOLOGY REPORT*  Clinical Data: Short of breath  CHEST - 2 VIEW  Comparison: 10/31/2011  Findings: Diffuse vascular congestion without definite edema is worse.  Increasing bibasilar atelectasis.  Borderline cardiomegaly. No pneumothorax.  Increased AP diameter the chest is again noted. Stable thoracic spine.  IMPRESSION: Vascular congestion is worse.  No definitive edema.   Original Report Authenticated By: Donavan Burnet, M.D.     Cardiac Studies:  ECG: 10/27/11  SR rate 6  LVH no acute changes   Echo:  10/25/11   Study Conclusions  - Left ventricle: The cavity size was normal. Systolic function was normal. The estimated ejection fraction was in the range of 60% to 65%. Wall motion was normal; there were no regional wall motion abnormalities. Features are consistent with a pseudonormal left ventricular filling pattern, with concomitant abnormal relaxation and increased filling pressure (grade 2 diastolic dysfunction). Doppler parameters are consistent with elevated ventricular end-diastolic filling pressure. - Aortic valve: There was severe stenosis. Mean gradient: 50mm Hg (S). Peak gradient: 91mm Hg (S). Valve area: 0.94cm^2(VTI). Valve area: 0.77cm^2 (Vmax). - Mitral valve: Leaflet separation was reduced. Mobility was restricted. Mild regurgitation. Mean gradient: 6mm Hg (D). Valve area by pressure half-time: 2.12cm^2. Valve  area by continuity equation (using LVOT flow): 1.45cm^2. - Left atrium: The atrium was moderately dilated.    Medications:     . dextromethorphan-guaiFENesin  1 tablet Oral BID  . enoxaparin  40 mg Subcutaneous Q24H  . famotidine  20 mg Oral Daily  . FLUoxetine  20 mg Oral Daily  . furosemide  80 mg Oral TID  . levothyroxine  137 mcg Oral Q0600  . metoprolol succinate  25 mg Oral Daily  . morphine  120 mg Oral BID  . moxifloxacin  400 mg Oral  Q2000  . nicotine  14 mg Transdermal Daily  . potassium chloride  20 mEq Oral BID  . senna-docusate  4 tablet Oral BID  . DISCONTD: furosemide  40 mg Oral TID  . DISCONTD: potassium chloride  20 mEq Oral Daily       Assessment/Plan:  Edema: Chronic venous disease continue lasix tid.  I believe she had antibiotics for "cellulitis" recent inpatient stay and  On moxifloxacin nowRx by IM.  Should get support hose and graduated compession stocking at least for right leg  AS:  Not a candidate for surgery or TAVR  EF preserved  Spinal Stenosis:  Leg weakness stable working on transfers and ambulation.    She wants to go home   Charlton Haws 11/05/2011, 8:49 AM

## 2011-11-05 NOTE — Plan of Care (Signed)
Problem: RH PAIN MANAGEMENT Goal: RH STG PAIN MANAGED AT OR BELOW PT'S PAIN GOAL 4 or less  Outcome: Not Progressing Pt had chronic back pain. Takes 120mg  Morphine ER BID. Pt states pain is 8/10 or higher

## 2011-11-05 NOTE — Progress Notes (Signed)
Social Work Patient ID: [name], female   DOB: 02-06-1946, 66 y.o.   MRN: [id number]  Met with patient, daughter and spouse today to review team conference - all agreeable with targeted d/c 11/07/11 at supervision to modi I  Level.  Reviewed DME and HH f/u to be arranged.  No concerns at this time.  [name]

## 2011-11-05 NOTE — Progress Notes (Signed)
Physical Therapy Note  Patient Details  Name: SONIYA ASHRAF MRN: [id number] Date of Birth: [birth year] Today's Date: 11/05/2011  1000 - 1055 (55 minutes) individual Pain : no complaint of pain Oxygen Sats 93 % on 2 L  [place] (resting) Focus of treatment: Gait training/endurance with RW on level/steps to enter home; step and car transfer training with husband Treatment: Gait training 76 feet RW SBA X 1; husband instructed in and provided guarding (min ) up/down 2 steps with one rail right (sideways) X 2 and SBA for stand/pivot car transfer with RW (practice car /standard height) . Oxygen sats  > 95% during activity.  [id number] (40 minutes) individual  Pain: no complaint of pain  Oxygen sats > 92%  On 2 L   Pulse 74 (resting) Focus of treatment: therapeutic exercise/activities for improved activity tolerance; gait training/endurance with RW Treatment: Transfers wc >< Nustep (ergonometer) SBA RW; Nustep Level 3  2 X 5 minutes , Oxygen sats post 5 minutes > 92%; Gait 65 feet RW SBA. Distance limited by c/o fatigue.   Margarite Vessel,JIM 11/05/2011, 7:53 AM

## 2011-11-05 NOTE — Progress Notes (Signed)
Occupational Therapy Session Note  Patient Details  Name: [name] MRN: [id number] Date of Birth: [birth year]  Today's Date: 11/05/2011 Time: 1100-1140 Time Calculation (min): 40 min  Short Term Goals = Long Term Goals  Skilled Therapeutic Interventions/Progress Updates:  Patient found seated in recliner. Therapist encouraged shower ADL, patient refused even after husband encouraged shower. Patient performed UB/LB bathing & dressing at sink level in sit -> stand position. Focused skilled intervention on BADL education, sit/stands, dynamic standing balance/tolerance/endurance, overall activity tolerance/endurance, toilet transfer, toileting, and functional ambulation using rolling walker. Patient left seated in recliner at end of session with call bell & phone within reach.   Precautions:  Precautions Precautions: Fall Restrictions Weight Bearing Restrictions: No Other Position/Activity Restrictions: left knee pain chronic  See FIM for current functional status  Therapy/Group: Individual Therapy  [name] 11/05/2011, 12:02 PM

## 2011-11-05 NOTE — Progress Notes (Signed)
Occupational Therapy Note  Patient Details  Name: [name] MRN: [id number] Date of Birth: April 13, 1945 Today's Date: 11/05/2011  Time: 1300-1340 Pt c/o 6/10 pain in lower back; RN aware Individual Therapy Pt sitting in chair eating lunch but agreeable to engaging in functional amb with RW for home mgmt tasks in room.  Pt completed all tasks including retrieving items from various surface heights and replacing them at supervision level.  Pt required rest breaks X 4 during session.  O2 sats>90% on2L O2 throughout session.  Focus on activity tolerance and safety awareness.   [name] [facility] 11/05/2011, 3:23 PM

## 2011-11-05 NOTE — Progress Notes (Signed)
Subjective/Complaints: Remains sleepy. Breathing  limiting at times. A 12 point review of systems has been performed and if not noted above is otherwise negative.   Objective: Vital Signs: Blood pressure 164/54, pulse 71, temperature 98.3 F (36.8 C), temperature source Oral, resp. rate 20, weight 115.3 kg (254 lb 3.1 oz), SpO2 98.00%. Dg Chest 2 View  11/05/2011  *RADIOLOGY REPORT*  Clinical Data: Shortness of breath, cough  CHEST - 2 VIEW  Comparison: Chest x-ray of 11/03/2011, 10/28/2011 and 06/04/2010  Findings: The apparent pulmonary vascular congestion has not changed significantly.  This may be superimposed on mild chronic interstitial change.  Cardiomegaly is stable.  No effusion is seen. The bones are osteopenic and there are degenerative changes throughout the thoracic spine.  IMPRESSION: Little change in probable pulmonary vascular congestion, possibly superimposed on chronic interstitial change.   Original Report Authenticated By: Juline Patch, M.D.    Dg Chest 2 View  11/03/2011  *RADIOLOGY REPORT*  Clinical Data: Short of breath  CHEST - 2 VIEW  Comparison: 10/31/2011  Findings: Diffuse vascular congestion without definite edema is worse.  Increasing bibasilar atelectasis.  Borderline cardiomegaly. No pneumothorax.  Increased AP diameter the chest is again noted. Stable thoracic spine.  IMPRESSION: Vascular congestion is worse.  No definitive edema.   Original Report Authenticated By: Donavan Burnet, M.D.    No results found for this basename: WBC:2,HGB:2,HCT:2,PLT:2 in the last 72 hours  Basename 11/05/11 0620  NA 143  K 3.8  CL 98  CO2 39*  GLUCOSE 137*  BUN 14  CREATININE 0.92  CALCIUM 8.5   CBG (last 3)  No results found for this basename: GLUCAP:3 in the last 72 hours  Wt Readings from Last 3 Encounters:  11/05/11 115.3 kg (254 lb 3.1 oz)  10/29/11 116.03 kg (255 lb 12.8 oz)  10/22/10 93.441 kg (206 lb)    Physical Exam:  Nursing note and vitals reviewed.    Constitutional: She is oriented to person, place, and time. She appears well-developed and well-nourished.  Morbidly obese. Sedated appearing at times (fluctuates) HENT:  Head: Normocephalic and atraumatic.  Eyes: Pupils are equal, round, and reactive to light.  Neck: Normal range of motion. Neck supple.  Cardiovascular: Normal rate and regular rhythm. Exam reveals no S3, no S4 and no friction rub.  Murmur heard.  Systolic murmur is present with a grade of 4/6  Pulmonary/Chest: Effort normal. She has scattered rhonchi, upper airway sounds Abdominal: Bowel sounds are normal. She exhibits no distension. There is no tenderness.  obese  Musculoskeletal: She exhibits edema.  Persistent 2+ edema BLE. kerlix on LLE with drainage. 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  Neurological: She is oriented to person, place, and time.  Follows commands without difficulty.  Skin: Skin is warm and dry.  Stasis changes BLE. She has more erythema in the right lower extremity than on left side  Sensation is reduced in bilateral feet to light touch.  Motor strength is 5/5 in bilateral deltoid, biceps, triceps, grip  4/5 in bilateral knee extensors 3 minus in hip flexors 2 minus in ankle dorsiflexors and plantar flexors  Assessment/Plan: 1. Functional deficits secondary to lumbar stenosis/ severe AS which require 3+ hours per day of interdisciplinary therapy in a comprehensive inpatient rehab setting. Physiatrist is providing close team supervision and 24 hour management of active medical problems listed below. Physiatrist and rehab team continue to assess barriers to discharge/monitor patient progress toward functional and medical goals. FIM: FIM - Bathing Bathing  Steps Patient Completed: Chest;Right Arm;Left Arm;Abdomen;Front perineal area;Buttocks;Right upper leg;Left upper leg;Left lower leg (including foot) Bathing: 4: Min-Patient completes 8-9 73f 10 parts or 75+ percent  FIM - Upper Body  Dressing/Undressing Upper body dressing/undressing steps patient completed: Thread/unthread right sleeve of pullover shirt/dresss;Thread/unthread left sleeve of pullover shirt/dress;Put head through opening of pull over shirt/dress;Pull shirt over trunk Upper body dressing/undressing: 5: Supervision: Safety issues/verbal cues FIM - Lower Body Dressing/Undressing Lower body dressing/undressing steps patient completed: Pull pants up/down;Don/Doff left sock Lower body dressing/undressing: 0: Wears gown/pajamas-no public clothing (patient wore gown)  FIM - Toileting Toileting steps completed by patient: Adjust clothing prior to toileting;Performs perineal hygiene;Adjust clothing after toileting Toileting Assistive Devices: Grab bar or rail for support Toileting: 5: Supervision: Safety issues/verbal cues  FIM - Diplomatic Services operational officer Devices: Art gallery manager Transfers: 5-To toilet/BSC: Supervision (verbal cues/safety issues);5-From toilet/BSC: Supervision (verbal cues/safety issues)  FIM - Bed/Chair Transfer Bed/Chair Transfer: 4: Chair or W/C > Bed: Min A (steadying Pt. > 75%);4: Bed > Chair or W/C: Min A (steadying Pt. > 75%)  FIM - Locomotion: Wheelchair Distance: 50' Locomotion: Wheelchair: 1: Travels less than 50 ft with supervision, cueing or coaxing FIM - Locomotion: Ambulation Locomotion: Ambulation Assistive Devices: Designer, industrial/product Ambulation/Gait Assistance: 5: Supervision Locomotion: Ambulation: 2: Travels 50 - 149 ft with supervision/safety issues  Comprehension Comprehension Mode: Auditory Comprehension: 6-Follows complex conversation/direction: With extra time/assistive device  Expression Expression Mode: Verbal Expression: 6-Expresses complex ideas: With extra time/assistive device  Social Interaction Social Interaction: 6-Interacts appropriately with others with medication or extra time (anti-anxiety, antidepressant).  Problem Solving Problem  Solving: 6-Solves complex problems: With extra time  Memory Memory: 6-More than reasonable amt of time  Medical Problem List and Plan:  1. DVT Prophylaxis/Anticoagulation: Pharmaceutical: Lovenox  2. Pain Management: continue home dose MS contin 120 mg bid with MSIR 15 mg q 4 hrs prn.   -pain typically worst in the am per pt.   -pt back on 120mg  ms contin q12 3. Mood: continue prozac for chronic depression.  4. Neuropsych: This patient is capable of making decisions on his/her own behalf.  5. Morbid obesity: Pressure relief measures. Elevate BLE when in bed/chair due to severe peripheral edema.  6. Chronic CHF/Severe AS: Monitor daily weights. Low salt diet. Will have dietitian educate patient on low salt diet (two bags of lite chips in front of patient).   Lasix 40 mg bid (changed to po) and metoprolol 25 mg daily.   -don't think we will be able to wean oxygen  -weights trending up a bit.   -will increase lasix to 80mg  TID, follow up labs ok  -recheck CXR with only slight change  -cards f/u 7. COPD: ongoing tobacco use.  8. PNA?: avelox D#6 Will add nebs to help with symtoms. Follow up CXR stable to improved  -IS  -robitussin LOS (Days) 6 A FACE TO FACE EVALUATION WAS PERFORMED  Hawraa Stambaugh T 11/05/2011, 9:13 AM

## 2011-11-06 ENCOUNTER — Inpatient Hospital Stay (HOSPITAL_COMMUNITY): Payer: Medicare Other | Admitting: Occupational Therapy

## 2011-11-06 ENCOUNTER — Inpatient Hospital Stay (HOSPITAL_COMMUNITY): Payer: Medicare Other | Admitting: Physical Therapy

## 2011-11-06 LAB — BASIC METABOLIC PANEL
CO2: 42 mEq/L (ref 19–32)
Calcium: 8.6 mg/dL (ref 8.4–10.5)
Creatinine, Ser: 0.91 mg/dL (ref 0.50–1.10)
GFR calc non Af Amer: 65 mL/min — ABNORMAL LOW (ref >90)
Sodium: 141 mEq/L (ref 135–145)

## 2011-11-06 NOTE — Progress Notes (Signed)
Physical Therapy Discharge Summary  Patient Details  Name: [name] MRN: [id number] Date of Birth: 01-Dec-1945  Today's Date: 11/06/2011 Time: 4098-1191 Time Calculation (min): 29 min  Skilled Therapeutic Interventions/Progress Updates:  Bathroom ambulation/mobility performed modified independent. Stair training x 10 steps continuous, supervision for safety. Discussed walking program at home, encouraged pt to set up walking program with HHPT to continue to increase endurance. Pt does not have further questions and understands risk for falling at home. Pt feels safe and excited to go home.   Patient has met 8 of 8 long term goals due to improved activity tolerance, improved balance, increased strength and ability to compensate for deficits.  Patient to discharge at an ambulatory level Modified Independent/supervision.   Patient's care partner is independent to provide the necessary physical assistance at discharge.  Reasons goals not met: NA    Recommendation:  Patient will benefit from ongoing skilled PT services in home health setting to continue to advance safe functional mobility, address ongoing impairments in functional weakness and deconditioning, balance, set with progressive home mobility plan, and minimize fall risk.  Equipment: wheelchair with cushion, RW  Reasons for discharge: treatment goals met and discharge from hospital  Patient/family agrees with progress made and goals achieved: Yes  PT Discharge Precautions/Restrictions Precautions Precautions: Fall Restrictions Weight Bearing Restrictions: No Vital Signs Therapy Vitals Temp: 97.9 F (36.6 C) Temp src: Oral Pulse Rate: 63  Resp: 20  BP: 118/63 mmHg Patient Position, if appropriate: Sitting Oxygen Therapy SpO2: 95 % O2 Device: Nasal cannula Pain  5/10 low back and Lt. Knee chronic pain, repositioned. Pt on scheduled home medication regimen.  Cognition  Mild memory deficits noted. Otherwise  WFL Sensation Sensation Light Touch: Impaired Detail Light Touch Impaired Details: Impaired RLE;Impaired LLE (toes and feet) Coordination Gross Motor Movements are Fluid and Coordinated: Yes Fine Motor Movements are Fluid and Coordinated: Yes  Mobility Bed Mobility Supine to Sit: 6: Modified independent (Device/Increase time) Sit to Supine: 6: Modified independent (Device/Increase time) Transfers Sit to Stand: 6: Modified independent (Device/Increase time) Stand to Sit: 6: Modified independent (Device/Increase time) Stand Pivot Transfers: 6: Modified independent (Device/Increase time) Locomotion  Ambulation Ambulation: Yes Ambulation/Gait Assistance: 5: Supervision Ambulation Distance (Feet): 150 Feet Assistive device: Rolling walker Ambulation/Gait Assistance Details: Cues still needed for posture and improved mechanics Gait Gait: Yes Gait Pattern: Impaired Gait Pattern: Decreased stride length;Trunk flexed (shoulder shrug) High Level Ambulation High Level Ambulation: Side stepping;Backwards walking;Direction changes Side Stepping: supervision Backwards Walking: supervision Direction Changes: supervision Stairs / Additional Locomotion Stairs: Yes Stairs Assistance: 5: Supervision Stair Management Technique: One rail Right Number of Stairs: 10  Wheelchair Mobility Wheelchair Mobility: Yes Wheelchair Assistance: 6: Modified independent (Device/Increase time) Occupational hygienist: Both upper extremities Wheelchair Parts Management: Independent Distance: 100'   Balance Balance Balance Assessed: Yes Static Standing Balance Static Standing - Balance Support: During functional activity;No upper extremity supported Static Standing - Level of Assistance: 6: Modified independent (Device/Increase time) Dynamic Standing Balance Dynamic Standing - Balance Support: During functional activity Dynamic Standing - Level of Assistance: 6: Modified independent (Device/Increase  time) Extremity Assessment      RLE Assessment RLE Assessment: Exceptions to [facility] RLE AROM (degrees) RLE Overall AROM Comments: Limited by body habitus RLE Strength RLE Overall Strength Comments: Generalized deconditioning, grossly >/= 4-/5. Symmetrical to Lt. LE  LLE Assessment LLE Assessment: Exceptions to WFL LLE AROM (degrees) LLE Overall AROM Comments: limited by body habitus LLE Strength LLE Overall Strength Comments: Generalized deconditioning, grossly >/= 4-/5.  Symmetrical to Rt. LE   See FIM for current functional status  [name] 11/06/2011, 5:20 PM

## 2011-11-06 NOTE — Progress Notes (Signed)
Physical Therapy Session Note  Patient Details  Name: [name] MRN: [id number] Date of Birth: March 05, 1945  Today's Date: 11/06/2011 Time: 4098-1191 Time Calculation (min): 59 min  Short Term Goals: Week 1:   = LTGs  Skilled Therapeutic Interventions/Progress Updates:    Wheelchair mobility x 100' modified independent, increased time secondary to fatigue/decreased endurance. Wheelchair mobility in home environment modified independent, increased time for all tasks. Ambulation in home environment x 50' with RW (forward, side stepping, backwards walking) with  supervision. Gait training over controlled environment x 130' with RW, supervision (cues for posture and scapular depression). Pt encouraged to avoid over-reliance on wheelchair at home, encouraged to ambulate as much as possible to maximize health benefits.  SpO2 ambulation with 2L [place] O2 = 93%, without supplemental O2 SpO2 = 86%, when 2L [place] O2 reapplied SpO2 = 93% with ambulation.   Therapy Documentation Precautions:  Precautions Precautions: Fall Restrictions Weight Bearing Restrictions: No Other Position/Activity Restrictions: left knee pain chronic Pain: Pain Assessment Pain Assessment: 0-10 Pain Score:   5 Faces Pain Scale: No hurt Pain Type: Chronic pain Pain Location: Knee Pain Orientation: Left Pain Descriptors: Aching Pain Onset: With Activity Pain Intervention(s): Repositioned (had just had pain medicine) 2nd Pain Site Pain Score: 5 Pain Type: Chronic pain Pain Location: Back Pain Orientation: Lower Pain Descriptors: Aching Pain Onset: With Activity Pain Intervention(s): Repositioned;Other (Comment) (had just had pain medicine)  See FIM for current functional status  Therapy/Group: Individual Therapy  [name] 11/06/2011, 12:31 PM

## 2011-11-06 NOTE — Progress Notes (Signed)
SATURATION QUALIFICATIONS:  Patient Saturations on Room Air at Rest = 86%  Patient Saturations on Room Air while Ambulating = 86%  Patient Saturations on 2 Liters of oxygen while Ambulating = 92%  Statement of medical necessity for home oxygen:  Above information per RN report.  Akilah Cureton

## 2011-11-06 NOTE — Progress Notes (Signed)
Occupational Therapy Session Notes & Discharge Summary  Patient Details  Name: RAJAE SEALEY MRN: [id number] Date of Birth: [birth year]  Today's Date: 11/06/2011  SESSION NOTES  Session #1 1100-1200 - 60 Minutes Individual Therapy No complaints of pain Patient found seated up in bed. Engaged in functional ambulation from bed -> bench in shower stall for ADL at shower level. Focused skilled intervention on overall activity tolerance/endurance while incorporating energy conservation prn, UB/LB bathing & dressing, encouraging patient to be as independent as possible, dynamic standing balance/tolerance/endurance, and grooming tasks standing at sink. At end of session left patient seated edge of bed with call bell & phone within reach.   Session #2 9562-1308 - 45 Minutes Individual Therapy No complaints of pain Focused skilled intervention on tub/shower transfer on/off tub transfer bench and SCIFIT UE exercises in therapy gym.   -------------------------------------------------------------------------------------------------------------------------   DISCHARGE SUMMARY Patient has met 6 of 7 long term goals due to improved activity tolerance, improved balance, postural control, improved attention, improved awareness and improved coordination.  Patient to discharge at overall Supervision level, except max assist for LB dressing.  Patient's care partner is independent to provide the necessary physical and supervision needed at discharge.    Reasons goals not met: Patient did not meet LB dressing goal of min assist, patient currently requires max assist with LB dressing secondary to patient unwilling to use AE to assist with getting dressed. Patient insists husband assist with LB dressing.   Recommendation:  Patient will benefit from ongoing skilled OT services in home health setting to continue to advance functional skills in the area of BADL, iADL and Reduce care partner  burden.  Equipment: tub transfer bench and BSC  Reasons for discharge: treatment goals met and discharge from hospital  Patient/family agrees with progress made and goals achieved: Yes  Precautions/Restrictions  Precautions Precautions: Fall Restrictions Weight Bearing Restrictions: No  ADL - See FIM  Vision/Perception  Vision - History Baseline Vision: Wears glasses all the time Patient Visual Report: No change from baseline Vision - Assessment Eye Alignment: Within Functional Limits Perception Perception: Within Functional Limits Praxis Praxis: Intact   Cognition Overall Cognitive Status: Appears within functional limits for tasks assessed Arousal/Alertness: Awake/alert Orientation Level: Oriented X4 Memory: Appears intact Memory Impairment: Decreased short term memory Awareness: Appears intact Problem Solving: Appears intact Safety/Judgment: Appears intact  Sensation Sensation Light Touch: Impaired Detail Light Touch Impaired Details: Impaired RLE;Impaired LLE (toes and feet) Additional Comments: Bilateral UEs appear intact Coordination Gross Motor Movements are Fluid and Coordinated: Yes Fine Motor Movements are Fluid and Coordinated: Yes  Motor  Motor Motor: Within Functional Limits  Mobility - See Discharge Navigator  Trunk/Postural Assessment  - See Discharge Navigator  Balance - See Discharge Navigator  Extremity/Trunk Assessment RUE Assessment RUE Assessment: Within Functional Limits LUE Assessment LUE Assessment: Within Functional Limits  See FIM for current functional status  Cheresa Siers 11/06/2011, 11:22 AM

## 2011-11-06 NOTE — Progress Notes (Signed)
CRITICAL VALUE ALERT  Critical value received:  Co2- 42   Date of notification:  11/06/11    Time of notification:  0650    Critical value read back:yes  Nurse who received alert:  Ethelene Browns RN  MD notified (1st page):  Dan Finland PA  Time of first page:  0655  MD notified (2nd page):  Time of second page:  Responding MD:  Dan Finland PA   Time MD responded:  [phone]

## 2011-11-06 NOTE — Progress Notes (Signed)
Subjective/Complaints: More alert. Breathing more comfortably todeay. A 12 point review of systems has been performed and if not noted above is otherwise negative.   Objective: Vital Signs: Blood pressure 125/71, pulse 74, temperature 98.1 F (36.7 C), temperature source Oral, resp. rate 19, weight 114.6 kg (252 lb 10.4 oz), SpO2 98.00%. Dg Chest 2 View  11/05/2011  *RADIOLOGY REPORT*  Clinical Data: Shortness of breath, cough  CHEST - 2 VIEW  Comparison: Chest x-ray of 11/03/2011, 10/28/2011 and 06/04/2010  Findings: The apparent pulmonary vascular congestion has not changed significantly.  This may be superimposed on mild chronic interstitial change.  Cardiomegaly is stable.  No effusion is seen. The bones are osteopenic and there are degenerative changes throughout the thoracic spine.  IMPRESSION: Little change in probable pulmonary vascular congestion, possibly superimposed on chronic interstitial change.   Original Report Authenticated By: Juline Patch, M.D.    No results found for this basename: WBC:2,HGB:2,HCT:2,PLT:2 in the last 72 hours  Basename 11/06/11 0540 11/05/11 0620  NA 141 143  K 4.0 3.8  CL 98 98  CO2 42* 39*  GLUCOSE 244* 137*  BUN 14 14  CREATININE 0.91 0.92  CALCIUM 8.6 8.5   CBG (last 3)  No results found for this basename: GLUCAP:3 in the last 72 hours  Wt Readings from Last 3 Encounters:  11/06/11 114.6 kg (252 lb 10.4 oz)  10/29/11 116.03 kg (255 lb 12.8 oz)  10/22/10 93.441 kg (206 lb)    Physical Exam:  Nursing note and vitals reviewed.  Constitutional: She is oriented to person, place, and time. She appears well-developed and well-nourished.  Morbidly obese. Sedated appearing at times (fluctuates) HENT:  Head: Normocephalic and atraumatic.  Eyes: Pupils are equal, round, and reactive to light.  Neck: Normal range of motion. Neck supple.  Cardiovascular: Normal rate and regular rhythm. Exam reveals no S3, no S4 and no friction rub.  Murmur heard.   Systolic murmur is present with a grade of 4/6  Pulmonary/Chest: Effort normal. She has scattered rhonchi, upper airway sounds Abdominal: Bowel sounds are normal. She exhibits no distension. There is no tenderness.  obese  Musculoskeletal: She exhibits edema.  Persistent 2+ edema BLE. kerlix on LLE with drainage. 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  Neurological: She is oriented to person, place, and time.  Follows commands without difficulty.  Skin: Skin is warm and dry.  Stasis changes BLE. She has more erythema in the right lower extremity than on left side  Sensation is reduced in bilateral feet to light touch.  Motor strength is 5/5 in bilateral deltoid, biceps, triceps, grip  4/5 in bilateral knee extensors 3 minus in hip flexors 2 minus in ankle dorsiflexors and plantar flexors  Assessment/Plan: 1. Functional deficits secondary to lumbar stenosis/ severe AS which require 3+ hours per day of interdisciplinary therapy in a comprehensive inpatient rehab setting. Physiatrist is providing close team supervision and 24 hour management of active medical problems listed below. Physiatrist and rehab team continue to assess barriers to discharge/monitor patient progress toward functional and medical goals. FIM: FIM - Bathing Bathing Steps Patient Completed: Chest;Right Arm;Left Arm;Abdomen;Front perineal area;Buttocks;Right upper leg;Left upper leg Bathing: 4: Min-Patient completes 8-9 76f 10 parts or 75+ percent  FIM - Upper Body Dressing/Undressing Upper body dressing/undressing steps patient completed: Thread/unthread right sleeve of pullover shirt/dresss;Thread/unthread left sleeve of pullover shirt/dress;Put head through opening of pull over shirt/dress;Pull shirt over trunk Upper body dressing/undressing: 5: Set-up assist to: Obtain clothing/put away FIM - Lower  Body Dressing/Undressing Lower body dressing/undressing steps patient completed: Thread/unthread right pants  leg;Thread/unthread left pants leg;Pull pants up/down Lower body dressing/undressing: 2: Max-Patient completed 25-49% of tasks  FIM - Toileting Toileting steps completed by patient: Adjust clothing prior to toileting;Adjust clothing after toileting;Performs perineal hygiene Toileting Assistive Devices: Grab bar or rail for support Toileting: 6: Assistive device: No helper  FIM - Diplomatic Services operational officer Devices: Elevated toilet seat;Grab bars;Walker Toilet Transfers: 5-To toilet/BSC: Supervision (verbal cues/safety issues);5-From toilet/BSC: Supervision (verbal cues/safety issues)  FIM - Bed/Chair Transfer Bed/Chair Transfer: 4: Chair or W/C > Bed: Min A (steadying Pt. > 75%);4: Bed > Chair or W/C: Min A (steadying Pt. > 75%)  FIM - Locomotion: Wheelchair Distance: 50' Locomotion: Wheelchair: 1: Travels less than 50 ft with supervision, cueing or coaxing FIM - Locomotion: Ambulation Locomotion: Ambulation Assistive Devices: Designer, industrial/product Ambulation/Gait Assistance: 5: Supervision Locomotion: Ambulation: 2: Travels 50 - 149 ft with supervision/safety issues  Comprehension Comprehension Mode: Auditory Comprehension: 5-Follows basic conversation/direction: With no assist  Expression Expression Mode: Verbal Expression: 5-Expresses basic needs/ideas: With no assist  Social Interaction Social Interaction: 6-Interacts appropriately with others with medication or extra time (anti-anxiety, antidepressant).  Problem Solving Problem Solving: 5-Solves basic problems: With no assist  Memory Memory: 6-More than reasonable amt of time  Medical Problem List and Plan:  1. DVT Prophylaxis/Anticoagulation: Pharmaceutical: Lovenox  2. Pain Management: continue home dose MS contin 120 mg bid with MSIR 15 mg q 4 hrs prn.   -pain typically worst in the am per pt.   -pt back on 120mg  ms contin q12 3. Mood: continue prozac for chronic depression.  4. Neuropsych: This  patient is capable of making decisions on his/her own behalf.  5. Morbid obesity: Pressure relief measures. Elevate BLE when in bed/chair due to severe peripheral edema.  6. Chronic CHF/Severe AS: Monitor daily weights. Low salt diet. Will have dietitian educate patient on low salt diet (two bags of lite chips in front of patient).   Lasix 40 mg bid (changed to po) and metoprolol 25 mg daily.   -send home on oxygen  -weights trending back down  -  lasix  80mg  TID, follow up labs remain ok  -clinically improved. Looks more comfortable  -cards f/u 7. COPD: ongoing tobacco use.  8. PNA?: avelox D#7- can dc at time of discharge  -IS  -robitussin LOS (Days) 7 A FACE TO FACE EVALUATION WAS PERFORMED  SWARTZ,ZACHARY T 11/06/2011, 8:53 AM

## 2011-11-06 NOTE — Progress Notes (Signed)
Inpatient Diabetes Program Recommendations  AACE/ADA: New Consensus Statement on Inpatient Glycemic Control (2013)  Target Ranges:  Prepandial:   less than 140 mg/dL      Peak postprandial:   less than 180 mg/dL (1-2 hours)      Critically ill patients:  140 - 180 mg/dL   Reason for Visit: Note lab glucose this morning=244 mg/dL.  No history of diabetes noted.  Consider checking A1C to determine pre-hospitalization glycemic control.

## 2011-11-07 DIAGNOSIS — M48061 Spinal stenosis, lumbar region without neurogenic claudication: Secondary | ICD-10-CM

## 2011-11-07 DIAGNOSIS — Z5189 Encounter for other specified aftercare: Secondary | ICD-10-CM

## 2011-11-07 MED ORDER — POTASSIUM CHLORIDE 10 MEQ PO TBCR: EXTENDED_RELEASE_TABLET | ORAL | Status: DC

## 2011-11-07 MED ORDER — FUROSEMIDE 80 MG PO TABS: 80.0000 mg | ORAL_TABLET | Freq: Two times a day (BID) | ORAL | Status: DC

## 2011-11-07 MED ORDER — DM-GUAIFENESIN ER 30-600 MG PO TB12: 1.0000 | ORAL_TABLET | Freq: Two times a day (BID) | ORAL | Status: AC

## 2011-11-07 MED ORDER — POTASSIUM CHLORIDE 10 MEQ PO TBCR: 20.0000 meq | EXTENDED_RELEASE_TABLET | Freq: Two times a day (BID) | ORAL | Status: DC

## 2011-11-07 MED ORDER — SENNOSIDES-DOCUSATE SODIUM 8.6-50 MG PO TABS: 4.0000 | ORAL_TABLET | Freq: Two times a day (BID) | ORAL | Status: DC

## 2011-11-07 MED ORDER — FAMOTIDINE 20 MG PO TABS: 20.0000 mg | ORAL_TABLET | Freq: Every day | ORAL | Status: DC

## 2011-11-07 NOTE — Progress Notes (Signed)
Patient ID: Elder Love, female   DOB: 1945/04/17, 66 y.o.   MRN: [id number]   Patient Name: [name] Date of Encounter: 11/07/2011    SUBJECTIVE  No SOB or CP this am.  CURRENT MEDS    . dextromethorphan-guaiFENesin  1 tablet Oral BID  . enoxaparin  40 mg Subcutaneous Q24H  . famotidine  20 mg Oral Daily  . FLUoxetine  20 mg Oral Daily  . furosemide  80 mg Oral TID  . levothyroxine  137 mcg Oral Q0600  . metoprolol succinate  25 mg Oral Daily  . morphine  120 mg Oral BID  . nicotine  14 mg Transdermal Daily  . potassium chloride  20 mEq Oral BID  . senna-docusate  4 tablet Oral BID    OBJECTIVE  Filed Vitals:   11/06/11 0550 11/06/11 1000 11/06/11 1500 11/07/11 0500  BP: 125/71  118/63 116/55  Pulse: 74  63 62  Temp: 98.1 F (36.7 C)  97.9 F (36.6 C) 98.1 F (36.7 C)  TempSrc: Oral  Oral Oral  Resp: 19  20 17   Weight: 252 lb 10.4 oz (114.6 kg)   260 lb 2.3 oz (118 kg)  SpO2: 98% 86% 95% 96%    Intake/Output Summary (Last 24 hours) at 11/07/11 4098 Last data filed at 11/06/11 2258  Gross per 24 hour  Intake   1320 ml  Output   2750 ml  Net  -1430 ml   Filed Weights   11/05/11 0555 11/06/11 0550 11/07/11 0500  Weight: 254 lb 3.1 oz (115.3 kg) 252 lb 10.4 oz (114.6 kg) 260 lb 2.3 oz (118 kg)    PHYSICAL EXAM  General: Pleasant, NAD. Obese Neuro: Alert and oriented X 3. Moves all extremities spontaneously. Psych: Normal affect. HEENT:  Normal  Neck: Supple without bruits or JVD. Lungs:  Resp regular and unlabored, CTA. Heart: RRR no s3, s4, AS murmur Abdomen: Soft, non-tender, non-distended, BS + x 4.  Extremities: No clubbing, cyanosis , chronic brawny edema.Marland Kitchen DP/PT/Radials 2+ and equal bilaterally.  Accessory Clinical Findings  CBC No results found for this basename: WBC:2,NEUTROABS:2,HGB:2,HCT:2,MCV:2,PLT:2 in the last 72 hours Basic Metabolic Panel  Basename 11/06/11 0540 11/05/11 0620  NA 141 143  K 4.0 3.8  CL 98 98  CO2 42* 39*    GLUCOSE 244* 137*  BUN 14 14  CREATININE 0.91 0.92  CALCIUM 8.6 8.5  MG -- --  PHOS -- --   Liver Function Tests No results found for this basename: AST:2,ALT:2,ALKPHOS:2,BILITOT:2,PROT:2,ALBUMIN:2 in the last 72 hours No results found for this basename: LIPASE:2,AMYLASE:2 in the last 72 hours Cardiac Enzymes No results found for this basename: CKTOTAL:3,CKMB:3,CKMBINDEX:3,TROPONINI:3 in the last 72 hours BNP No components found with this basename: POCBNP:3 D-Dimer No results found for this basename: DDIMER:2 in the last 72 hours Hemoglobin A1C No results found for this basename: HGBA1C in the last 72 hours Fasting Lipid Panel No results found for this basename: CHOL,HDL,LDLCALC,TRIG,CHOLHDL,LDLDIRECT in the last 72 hours Thyroid Function Tests No results found for this basename: TSH,T4TOTAL,FREET3,T3FREE,THYROIDAB in the last 72 hours  TELE NSR   ECG    Radiology/Studies  Dg Chest 2 View  11/05/2011  *RADIOLOGY REPORT*  Clinical Data: Shortness of breath, cough  CHEST - 2 VIEW  Comparison: Chest x-ray of 11/03/2011, 10/28/2011 and 06/04/2010  Findings: The apparent pulmonary vascular congestion has not changed significantly.  This may be superimposed on mild chronic interstitial change.  Cardiomegaly is stable.  No effusion is seen. The  bones are osteopenic and there are degenerative changes throughout the thoracic spine.  IMPRESSION: Little change in probable pulmonary vascular congestion, possibly superimposed on chronic interstitial change.   Original Report Authenticated By: Juline Patch, M.D.    Dg Chest 2 View  11/03/2011  *RADIOLOGY REPORT*  Clinical Data: Short of breath  CHEST - 2 VIEW  Comparison: 10/31/2011  Findings: Diffuse vascular congestion without definite edema is worse.  Increasing bibasilar atelectasis.  Borderline cardiomegaly. No pneumothorax.  Increased AP diameter the chest is again noted. Stable thoracic spine.  IMPRESSION: Vascular congestion is  worse.  No definitive edema.   Original Report Authenticated By: Donavan Burnet, M.D.    Dg Chest 2 View  10/31/2011  *RADIOLOGY REPORT*  Clinical Data: Follow up right middle lobe effusion.  CHEST - 2 VIEW  Comparison: 10/28/2011.  Findings: Improving aeration at the right lung base.  Small areas of atelectasis are present at the costophrenic angles bilaterally. No effusion.  Cardiopericardial silhouette is mildly enlarged. Right rotator cuff repair anchors.  IMPRESSION: Improving small focus of airspace disease in the right middle lobe. Development of mild bilateral basilar atelectasis.   Original Report Authenticated By: Andreas Newport, M.D.    Dg Chest 2 View  10/28/2011  *RADIOLOGY REPORT*  Clinical Data: Productive cough.  CHEST - 2 VIEW  Comparison: 06/04/2010 and 07/19/2009  Findings: There is mild chronic cardiomegaly.  Pulmonary vascularity is normal.  There is a small patchy area of density, probably in the right middle lobe best seen on the lateral view, which is new since 07/19/2009.  Lungs are otherwise clear except for peribronchial thickening.  No effusions.  IMPRESSION: 1.  Small patchy infiltrate in the right middle lobe. 2.  Bronchitic changes.   Original Report Authenticated By: Gwynn Burly, M.D.    Mr Lumbar Spine Wo Contrast  10/24/2011  *RADIOLOGY REPORT*  Clinical Data: Lower extremity weakness.  Severe low back pain.  MRI LUMBAR SPINE WITHOUT CONTRAST  Technique:  Multiplanar and multiecho pulse sequences of the lumbar spine were obtained without intravenous contrast.  Comparison: MRI of the lumbar spine 05/19/2009.  Findings: Normal signal is present in the conus medullaris which terminates at L1-2, within normal limits.  Rightward curvature of the lumbar spine is centered at L3 without significant interval change.  Chronic end plate marrow changes are present at T11-12, L1- 2, L4-5, and L5-S1.  Vertebral body heights and AP alignment are maintained.  Limited imaging of the  abdomen is unremarkable.  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  L1-2:  Mild leftward disc bulging is present.  Moderate facet hypertrophy is present bilaterally.  No significant stenosis is present.  L2-3:  A leftward disc herniation is present.  Moderate facet hypertrophy is evident.  The left lateral recess narrowing has progressed.  There is mild encroachment into the foramina bilaterally without significant stenosis.  L3-4:  A broad-based disc herniation is present.  Moderate facet hypertrophy is present.  There is slight progression of mild central canal stenosis with left greater than right lateral recess narrowing.  The disc extends into the inferior recess of both neural foramina without significant stenosis.  L4-5:  A broad-based disc herniation is present.  Advanced facet hypertrophy is present.  There is progression of severe central canal stenosis.  Mild foraminal narrowing is present bilaterally.  L5-S1:  A broad-based disc herniation is present.  Moderate facet hypertrophy is evident.  This results in moderate right and mild left foraminal  narrowing, slightly worse than on the prior exam.  IMPRESSION:  1.  Progression of severe central canal stenosis at L4-5. 2.  Progression of moderate right and mild left foraminal stenosis at L5-S1. 3.  Mild foraminal narrowing bilaterally at L4-5 is stable. 4.  Progression of mild central canal narrowing at L3-4 with left greater than right lateral recess narrowing. 5.  Progression of left lateral recess narrowing at L2-3.  6.  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   Original Report Authenticated By: Jamesetta Orleans. MATTERN, M.D.    Dg Knee Complete 4 Views Left  10/24/2011  *RADIOLOGY REPORT*  Clinical Data: History of pain and swelling involving the anterior and posterior aspects of the left knee with no known injury.  LEFT KNEE - COMPLETE 4+ VIEW  Comparison: None.  Findings: There is obesity.   There is osteopenic appearance of bones.  There is narrowing of the medial joint space.  There is minimal patellar spurring.  There is slight fullness in the suprapatellar region on lateral examination which may reflect a small amount of joint effusion.  No chondrocalcinosis or opaque loose body is evident.  No fracture or dislocation is evident.  IMPRESSION: Obesity.  Osteopenic appearance of bones.  Narrowing of medial joint space.  Patellar spurring.  Question small joint effusion.   Original Report Authenticated By: Crawford Givens, M.D.     ASSESSMENT AND PLAN  Principal Problem:  *Spinal stenosis of lumbar region Active Problems:  THROMBOCYTOPENIA  AORTIC STENOSIS  Edema  Morbid obesity  Acute on chronic diastolic CHF (congestive heart failure), NYHA class 1   With her severe AS, chronic brawny edema and body habitus, it is imperative that she weigh daily and adjust lasix as needed. Long discussion with patient about how to do this with 3 pound margin. She understands and recited to me verbal understanding with various clinical scenarios. Confirmed with Dr Noland Fordyce.  Signed, Valera Castle MD

## 2011-11-07 NOTE — Progress Notes (Signed)
Subjective/Complaints: More alert. Excited to go home today. A 12 point review of systems has been performed and if not noted above is otherwise negative.   Objective: Vital Signs: Blood pressure 116/55, pulse 62, temperature 98.1 F (36.7 C), temperature source Oral, resp. rate 17, weight 118 kg (260 lb 2.3 oz), SpO2 96.00%. No results found. No results found for this basename: WBC:2,HGB:2,HCT:2,PLT:2 in the last 72 hours  Basename 11/06/11 0540 11/05/11 0620  NA 141 143  K 4.0 3.8  CL 98 98  CO2 42* 39*  GLUCOSE 244* 137*  BUN 14 14  CREATININE 0.91 0.92  CALCIUM 8.6 8.5   CBG (last 3)  No results found for this basename: GLUCAP:3 in the last 72 hours  Wt Readings from Last 3 Encounters:  11/07/11 118 kg (260 lb 2.3 oz)  10/29/11 116.03 kg (255 lb 12.8 oz)  10/22/10 93.441 kg (206 lb)    Physical Exam:  Nursing note and vitals reviewed.  Constitutional: She is oriented to person, place, and time. She appears well-developed and well-nourished.  Morbidly obese. Sedated appearing at times (fluctuates) HENT:  Head: Normocephalic and atraumatic.  Eyes: Pupils are equal, round, and reactive to light.  Neck: Normal range of motion. Neck supple.  Cardiovascular: Normal rate and regular rhythm. Exam reveals no S3, no S4 and no friction rub.  Murmur heard.  Systolic murmur is present with a grade of 4/6  Pulmonary/Chest: Effort normal. She has scattered rhonchi, upper airway sounds Abdominal: Bowel sounds are normal. She exhibits no distension. There is no tenderness.  obese  Musculoskeletal: She exhibits edema.  Persistent 2+ edema BLE. kerlix on LLE with drainage. 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  Neurological: She is oriented to person, place, and time.  Follows commands without difficulty.  Skin: Skin is warm and dry.  Stasis changes BLE. She has more erythema in the right lower extremity than on left side  Sensation is reduced in bilateral feet to light touch.    Motor strength is 5/5 in bilateral deltoid, biceps, triceps, grip  4/5 in bilateral knee extensors 3 minus in hip flexors 2 minus in ankle dorsiflexors and plantar flexors  Assessment/Plan: 1. Functional deficits secondary to lumbar stenosis/ severe AS which require 3+ hours per day of interdisciplinary therapy in a comprehensive inpatient rehab setting. Physiatrist is providing close team supervision and 24 hour management of active medical problems listed below. Physiatrist and rehab team continue to assess barriers to discharge/monitor patient progress toward functional and medical goals. FIM: FIM - Bathing Bathing Steps Patient Completed: Chest;Right Arm;Left Arm;Abdomen;Front perineal area;Buttocks;Right upper leg;Left upper leg;Right lower leg (including foot);Left lower leg (including foot) Bathing: 6: Assistive device (Comment)  FIM - Upper Body Dressing/Undressing Upper body dressing/undressing steps patient completed: Thread/unthread right sleeve of pullover shirt/dresss;Thread/unthread left sleeve of pullover shirt/dress;Put head through opening of pull over shirt/dress;Pull shirt over trunk Upper body dressing/undressing: 5: Set-up assist to: Obtain clothing/put away FIM - Lower Body Dressing/Undressing Lower body dressing/undressing steps patient completed: Thread/unthread right pants leg;Pull pants up/down Lower body dressing/undressing: 2: Max-Patient completed 25-49% of tasks  FIM - Toileting Toileting steps completed by patient: Adjust clothing prior to toileting;Performs perineal hygiene;Adjust clothing after toileting Toileting Assistive Devices: Grab bar or rail for support Toileting: 6: Assistive device: No helper  FIM - Diplomatic Services operational officer Devices: Elevated toilet seat;Grab bars;Walker Toilet Transfers: 6-Assistive device: No helper  FIM - Games developer Transfer: 6: Supine > Sit: No assist;6: Sit > Supine: No assist;6:  Bed >  Chair or W/C: No assist;6: Chair or W/C > Bed: No assist  FIM - Locomotion: Wheelchair Distance: 100' Locomotion: Wheelchair: 5: Travels 50 - 149 ft, turns around, maneuvers to table, bed or toilet, negotiates 3% grade: modified independent FIM - Locomotion: Ambulation Locomotion: Ambulation Assistive Devices: Designer, industrial/product Ambulation/Gait Assistance: 5: Supervision Locomotion: Ambulation: 5: Travels 150 ft or more with supervision/safety issues  Comprehension Comprehension Mode: Auditory Comprehension: 6-Follows complex conversation/direction: With extra time/assistive device  Expression Expression Mode: Verbal Expression: 6-Expresses complex ideas: With extra time/assistive device  Social Interaction Social Interaction: 6-Interacts appropriately with others with medication or extra time (anti-anxiety, antidepressant).  Problem Solving Problem Solving: 6-Solves complex problems: With extra time  Memory Memory: 6-Assistive device: No helper  Medical Problem List and Plan:  1. DVT Prophylaxis/Anticoagulation: Pharmaceutical: Lovenox  2. Pain Management: continue home dose MS contin 120 mg bid with MSIR 15 mg q 4 hrs prn.   -pain typically worst in the am per pt.   -pt back on 120mg  ms contin q12 3. Mood: continue prozac for chronic depression.  4. Neuropsych: This patient is capable of making decisions on his/her own behalf.  5. Morbid obesity: Pressure relief measures. Elevate BLE when in bed/chair due to severe peripheral edema.  6. Chronic CHF/Severe AS: Monitor daily weights. Low salt diet. Will have dietitian educate patient on low salt diet (two bags of lite chips in front of patient).   Lasix 40 mg bid (changed to po) and metoprolol 25 mg daily.   -send home on oxygen  -weights trending back down  -  lasix  80mg  --decrease to bid per cards recs.  -clinically improved. Looks more comfortable  -close weight follow up at home 7. COPD: ongoing tobacco use.  8. PNA?:  avelox-dc today  -IS  -robitussin LOS (Days) 8 A FACE TO FACE EVALUATION WAS PERFORMED  SWARTZ,ZACHARY T 11/07/2011, 8:41 AM

## 2011-11-07 NOTE — Progress Notes (Signed)
Pt. Discharged to home from rehab at 1130 AM.  Significant other at bedside upon discharge.  All discharge instructions provided by PAM LOVE, PA.  No further questions asked.  All personal belongings in tow. Escorted to vehicle by NT.

## 2011-11-07 NOTE — Progress Notes (Signed)
Social Work  Discharge Note  The overall goal for the admission was met for:   Discharge location: Yes - home with husband to provide any needed assistance  Length of Stay: Yes - 8 days  Discharge activity level: Yes - modified independent to supervision  Home/community participation: Yes  Services provided included: MD, RD, PT, OT, RN, TR, Pharmacy and SW  Financial Services: Medicare(**[employer] Medicare)  Follow-up services arranged: Home Health: RN, PT via [facility], DME: 20x18 Breezy w/c with cushion, rolling walker, 3n1 commode and tub bench via [facility] and Patient/Family has no preference for HH/DME agencies  Comments (or additional information):  Patient/Family verbalized understanding of follow-up arrangements: Yes  Individual responsible for coordination of the follow-up plan: patient  Confirmed correct DME delivered: Raymonda Pell 11/07/2011    Lucianna Ostlund

## 2011-11-10 ENCOUNTER — Telehealth: Payer: Self-pay

## 2011-11-10 NOTE — Telephone Encounter (Signed)
Need home health orders.

## 2011-11-11 NOTE — Telephone Encounter (Signed)
Verbal orders given for home health and plan of care.

## 2011-11-13 ENCOUNTER — Telehealth: Payer: Self-pay | Admitting: *Deleted

## 2011-11-13 ENCOUNTER — Encounter: Payer: Self-pay | Admitting: Physician Assistant

## 2011-11-13 ENCOUNTER — Ambulatory Visit (INDEPENDENT_AMBULATORY_CARE_PROVIDER_SITE_OTHER): Payer: Medicare Other | Admitting: Physician Assistant

## 2011-11-13 VITALS — BP 105/50 | HR 71 | Ht 60.0 in | Wt 245.8 lb

## 2011-11-13 DIAGNOSIS — I350 Nonrheumatic aortic (valve) stenosis: Secondary | ICD-10-CM

## 2011-11-13 DIAGNOSIS — I5032 Chronic diastolic (congestive) heart failure: Secondary | ICD-10-CM

## 2011-11-13 DIAGNOSIS — I359 Nonrheumatic aortic valve disorder, unspecified: Secondary | ICD-10-CM

## 2011-11-13 LAB — BASIC METABOLIC PANEL
BUN: 20 mg/dL (ref 6–23)
Calcium: 8.5 mg/dL (ref 8.4–10.5)
GFR: 61.97 mL/min (ref >60.00)
Potassium: 3.1 mEq/L — ABNORMAL LOW (ref 3.5–5.1)

## 2011-11-13 MED ORDER — POTASSIUM CHLORIDE CRYS ER 20 MEQ PO TBCR: 20.0000 meq | EXTENDED_RELEASE_TABLET | Freq: Every day | ORAL | Status: DC

## 2011-11-13 MED ORDER — FUROSEMIDE 80 MG PO TABS: 80.0000 mg | ORAL_TABLET | ORAL | Status: DC

## 2011-11-13 MED ORDER — POTASSIUM CHLORIDE CRYS ER 20 MEQ PO TBCR: 20.0000 meq | EXTENDED_RELEASE_TABLET | Freq: Two times a day (BID) | ORAL | Status: DC

## 2011-11-13 NOTE — Patient Instructions (Addendum)
Your physician has recommended you make the following change in your medication:  1.) DECREASE   LASIX TAKE ONE TABLET (80 MG) IN THE AM AND ONE HALF TABLET (40 MG ) IN THE PM 2.)  DECREASE POTASSIUM TO ONE TABLET ( 20 MEQ) IN THE AM AND ONE HALF TABLET (10 MEQ ) IN THE PM  WEIGH DAILY: IF WEIGHT IS UP BY 2LBS IN ONE DAY INCREASE LASIX TO (80 MG) TWICE A DAY AND POTASSUIM (20 MEQ) TWICE A DAY AND CALL us AT [phone]    Your physician recommends that you HAVE lab work TODAY: BMET AND I WILL CALL [facility] TO SCHEDULE YOUR NEXT BMET WITH YOUR HOME HEALTH NURSE AND HAVE HER FAX RESULTS TO OUR OFFICE  Your physician recommends that you schedule a follow-up appointment in: 3- 4 WEEKS WITH DR. Eden Emms

## 2011-11-13 NOTE — Telephone Encounter (Signed)
s/w pt's daughter [name] about lab results and to have pt take extra K+ tonight and pt to start 20 meq bid 11/14/11, bmet 9/24 w/HHRN, daughter gave verbal understanding today

## 2011-11-13 NOTE — Progress Notes (Signed)
[address] [place], [place]  [address] Phone: [phone] Fax:  [phone]  Date:  11/13/2011   Name:  [name]   DOB:  06/01/1945   MRN:  [id number]  PCP:  [name], MD  Primary Cardiologist:  Dr. Charlton Haws  PV:  Dr. Tonny Bollman  Primary Electrophysiologist:  None    History of Present Illness: [name] is a 66 y.o. female who returns for post hospital follow up.  She has a history of COPD with previous VDRF in 2010, chronic thrombocytopenia, PVD, chronic diastolic CHF, RLE cellulitis, PAD, and aortic stenosis.  Cardiac catheterization 12/10: Normal coronary arteries.  She is not an operative or TAVR candidate for treatment of her AS due to co-morbidities.    She was admitted to [facility] 8/30-9/5 with lower extremity numbness and pain secondary to significant lumbar stenosis. She was not felt to be a surgical candidate. Echocardiogram 10/25/11: EF 60-65%, grade 2 diastolic dysfunction, severe aortic stenosis, mean gradient 50, mild MR. Patient did have volume overload and required IV Lasix at times. Cardiology followed her for this problem.  She was initially discharged to inpatient rehabilitation. She is now back home. Her weights since returning home have continued to decrease. Initially her weight was 244. Today it is 235. She sleeps on 2-3 pillows for comfort only. She denies orthopnea. She denies PND. LE edema is improved. She denies chest pain. She denies syncope, lightheadedness or dizziness.   Labs (11/06/11): K 4, creatinine 0.91    Wt Readings from Last 3 Encounters:  11/13/11 245 lb 12.8 oz (111.494 kg)  11/07/11 260 lb 2.3 oz (118 kg)  10/29/11 255 lb 12.8 oz (116.03 kg)     Past Medical History  Diagnosis Date  . Carotid bruit     0-39% bilateral ICA by dopplers 03/2010  . Aortic stenosis     a. Moderate echo 01/2009, then severe 03/2010 --> last echo 09/2011.  b. Not an operative candidate due to  comorbidities, not a candidate for TAVR given PVD. c. low risk myovue 11/2007 brest attenuation EF  63%  . Venous insufficiency   . Arthritis   . Depression   . COPD (chronic obstructive pulmonary disease)   . Hypothyroidism   . Thrombocytopenia   . Hypokalemia   . Edema   . Respiratory failure     Ventilator-dependent respiratory failure secondary to hypercarbia, cardiac arrest, presumed penumonia 12/2008  . Cardiac arrest     12/2010 in setting of respiratory failure with less than 15 minutes of CPR; initial rhythm was asystole  . PVD (peripheral vascular disease)     Eval by Dr. Excell Seltzer 2012: noninvasive immaging suggested significant aortoiliac disease, ABIs in mild range  - med rx given significant comorbidities  . H/O endoscopy     For ?ampullary mass - had EGD 07/2010 with no evidence of ampullary mass, some CBD dilitation   . Chronic pain   . Lumbar stenosis     Admitted 09/2011 - not felt to be a good candidate for surgical intervention.    Current Outpatient Prescriptions  Medication Sig Dispense Refill  . acetaminophen (TYLENOL) 325 MG tablet Take 650 mg by mouth every 6 (six) hours as needed.      Marland Kitchen dextromethorphan-guaiFENesin (MUCINEX DM) 30-600 MG per 12 hr tablet Take 1 tablet by mouth 2 (two) times daily.      . famotidine (PEPCID) 20 MG tablet Take 1 tablet (20 mg total)  by mouth daily. For indigestion  30 tablet  1  . FLUoxetine (PROZAC) 20 MG capsule Take 20 mg by mouth daily.        . furosemide (LASIX) 80 MG tablet Take 1 tablet (80 mg total) by mouth 2 (two) times daily.  180 tablet  1  . levothyroxine (SYNTHROID, LEVOTHROID) 137 MCG tablet Take 137 mcg by mouth daily.      . metoprolol succinate (TOPROL-XL) 25 MG 24 hr tablet Take 25 mg by mouth daily.      Marland Kitchen morphine (MS CONTIN) 60 MG 12 hr tablet Take 120 mg by mouth 2 (two) times daily.      Marland Kitchen morphine (MSIR) 15 MG tablet Take 15 mg by mouth every 4 (four) hours as needed. pain      . potassium chloride  (KLOR-CON) 10 MEQ CR tablet 2 tabs po qam and 2 po qhs  120 tablet  1  . senna-docusate (SENOKOT-S) 8.6-50 MG per tablet Take 4 tablets by mouth 2 (two) times daily. For constipation.        Allergies: Allergies  Allergen Reactions  . Latex     REACTION: itch  . Penicillins     REACTION: hives    History  Substance Use Topics  . Smoking status: Former Smoker -- 1.5 packs/day for 45 years  . Smokeless tobacco: Current User   Comment: PT USES VAPOR CIGARETTE  . Alcohol Use: Not on file     ROS:  Please see the history of present illness.   No fevers, chills, melena, hematochezia. She has a nonproductive cough.  All other systems reviewed and negative.   PHYSICAL EXAM: VS:  BP 105/50  Pulse 71  Ht 5' (1.524 m)  Wt 245 lb 12.8 oz (111.494 kg)  BMI 48.00 kg/m2 Well nourished, well developed, in no acute distress HEENT: normal Neck: no JVD at 90 Cardiac:  normal S1, diminished S2; RRR; 2/6 harsh systolic murmur heard best at the RUSB Lungs:  clear to auscultation bilaterally, no wheezing, rhonchi or rales Abd: soft, nontender, no hepatomegaly Ext: 1+ brawny bilateral LE edema Skin: warm and dry Neuro:  CNs 2-12 intact, no focal abnormalities noted  EKG:  Sinus rhythm, heart rate 71, LAD      ASSESSMENT AND PLAN:  1. Chronic Diastolic CHF: This is in the setting of severe aortic stenosis. Her volume appears to be stable. Her weight has continued to decrease since discharge. Blood pressure is somewhat borderline. I'm concerned that she may get over diuresed. I have asked her to cut her Lasix back to 80 mg in the morning and 40 mg in the afternoon. She will also decrease her evening dose of potassium to 10 mEq. We will check basic metabolic panel today and a followup basic metabolic panel in one week. I have asked her to continue to monitor her weights. If she sees a 2 pound gain in one day or 4-5 pounds in 5-7 days, she knows to increase her Lasix back to 80 mg twice a day and  contact our office. Followup with Dr. Eden Emms in 3-4 weeks.  2. Severe Aortic Stenosis: The patient's family had many questions regarding this today. We discussed that she is not an operative candidate and the typical nature of progressive aortic stenosis. As noted above, I'm concerned that she may be getting over diuresed and I will cut back on her Lasix somewhat. Followup with Dr. Eden Emms in 3-4 weeks.  3. COPD:  She remains on home  O2.  4. Lumbar Stenosis:  She has HHPT.  She is not an operative candidate.    Signed, Tereso Newcomer, PA-C  10:24 AM 11/13/2011

## 2011-11-13 NOTE — Telephone Encounter (Signed)
Message copied by Tarri Fuller on Thu Nov 13, 2011  5:18 PM ------      Message from: [place], [place] T      Created: Thu Nov 13, 2011  5:08 PM       K+ low      Take extra K+ 20 mEq x 1 today.      At office visit today, we discussed decreasing K+ to 20 in A and 10 in P.             ==>  Instead, I want her to continue taking K+ 20 mEq BID.  <==      Check BMET Monday of next week 9/23      Tereso Newcomer, PA-C  5:07 PM 11/13/2011

## 2011-11-13 NOTE — Telephone Encounter (Signed)
Message copied by Tarri Fuller on Thu Nov 13, 2011  5:20 PM ------      Message from: [place], [place] T      Created: Thu Nov 13, 2011  5:08 PM       K+ low      Take extra K+ 20 mEq x 1 today.      At office visit today, we discussed decreasing K+ to 20 in A and 10 in P.             ==>  Instead, I want her to continue taking K+ 20 mEq BID.  <==      Check BMET Monday of next week 9/23      Tereso Newcomer, PA-C  5:07 PM 11/13/2011

## 2011-11-13 NOTE — Telephone Encounter (Signed)
d/c with rn from [facility] will be drawing a bmet on tuesday, 9/24 and faxing results

## 2011-11-20 NOTE — Discharge Summary (Signed)
NAMETAURIE, GREENSLADE NO.:  [id number]  MEDICAL RECORD NO.:  [id number]  LOCATION:  4148                         FACILITY:  [facility]  PHYSICIAN:  Ranelle Oyster, M.D.DATE OF BIRTH:  1945-09-24  DATE OF ADMISSION:  10/30/2011 DATE OF DISCHARGE:  11/07/2011                              DISCHARGE SUMMARY   DISCHARGE DIAGNOSES: 1. Lumbar spinal stenosis. 2. Morbid obesity. 3. Acute-on-chronic congestive heart failure. 4. Chronic obstructive pulmonary disease. 5. COPD exacerbation v/s pneumonia.  HISTORY OF PRESENT ILLNESS:  [name] is a 66 year old morbidly obese female with history of COPD, aortic stenosis- nonoperable, as well as chronic low back pain.  She was admitted with complaints of numbness and pain in lower extremity as well as difficulty standing and right lower extremity weakness.  She was evaluated by Dr. Newell Coral, and MRI of spine showed progression of severe central canal stenosis at L4-L5 and moderate canal stenosis at L5-S1.  Cardiology was consulted for input on surgical decompression, and felt that she was an extremely high surgical risk.  Medical treatment was recommended by Neurosurgery.  The patient was also noted to have lower extremity edema and has been treated with IV diuretics.  Chest x-ray was done due to complaints of productive cough and patchy right middle lobe infiltrate noted; therefore, the patient started on Avelox for possible PNA.  She was evaluated by Rehab Team and CIR was recommended for progression.  PAST MEDICAL HISTORY: 1. Carotid bruit with 0-39% bilateral ICA stenosis. 2. Severe aortic stenosis. 3. Venous insufficiency. 4. Depression. 5. COPD. 6. Hypothyroidism. 7. Thrombocytopenia. 8. Peripheral Edema. 9. Hypokalemia. 10.History of cardiac arrest in the setting of respiratory failure. 11.Peripheral vascular disease. 12.Chronic pain.  FUNCTIONAL HISTORY:  The patient was independent but sedentary  prior to admission.  FUNCTIONAL STATUS:  The patient was total assist 30% for sit to side lying.  Min assist for stand pivot transfers.  Min guard assist for ambulating 25 feet with rolling walker with decreased stride length and flexed trunk.  She required supervision for grooming, supervision for lower body dressing, and min guard assist for toileting transfers.  HOSPITAL COURSE:  [name] was admitted to Rehab on October 30, 2011, for inpatient therapies to consist of PT, OT at least 3 hours 5 days a week.  Past-admission, physiatrist, rehab, RN, and Therapy Team have worked together to provide customized collaborative interdisciplinary care.  Rehab RN has worked with the patient on bowel and bladder program as well as med administration.  The patient was maintained on high doses of Lasix during the stay to help with fluid overload.  Labs were done for followup and close monitoring of her potassium level.  A check of lytes prior to discharge on September 12, revealed sodium 141, potassium 4.0, chloride 98, CO2 of 42, BUN 14, creatinine 0.91, glucose 244.  The patient was treated with 7 total days of  antibiotics for  PNA versus COPD exacerbation.  Followup chest x-ray showed improvement.  The patient's Lasix was changed to p.o. basis and was decreased to 80 mg p.o. b.i.d. at the time of discharge. The patient was advised on monitoring daily weights past discharge.  She was educated on a low-salt diet and close monitoring of weight with additional Lasix dose as needed if the she had a 3-pound weight increase.  During the patient's stay in rehab, weekly team conferences were held to monitor the patient's progress, set goals, as well as discuss barriers to discharge.  OT has worked with the patient on self-care tasks, activity tolerance, as well as energy conservation measures.  At the time of discharge, the patient was at supervision level overall for ADLs.  She did require  max assist for lower body dressing.  She was unwilling to use Adaptic equipment for independece low body dressing tasks. The patient's husband will provide assistance with dressing past discharge.  Physical Therapy has worked with the patient on mobility as well as strengthening.  She was modified independent for transfers and modified independent for ambulating in supervised setting.  She was ablet o ambulate 150 feet with rolling walker and supervision.  She was able to navigate 5-10 stairs with supervision.  The patient was educated on walking program at home.  Further followup, home health PT to continue with [facility] past-discharge.  On November 07, 2011, the patient was discharged to home.  DISCHARGE MEDICATIONS: 1. Pepcid 20 mg p.o. per day. 2. Prozac 20 mg p.o. per day. 3. Levothyroxine 137 mcg p.o. per day. 4. Metoprolol 25 mg p.o. per day. 5. MSIR 15 mg q.4 h. p.r.n. pain. 6. MS Contin 120 mg b.i.d. 7. Senna-S 4 p.o. b.i.d. 8. Lasix 80 mg p.o. b.i.d.  DIET:  Heart-healthy, 2-g salt restriction.  ACTIVITIES:  As tolerated with intermittent supervision.  SPECIAL INSTRUCTIONS:  [facility] to provide PT and RN.  Weight daily.  Call MD for weight gain of over 3 pounds or for increasing shortness of breath.  FOLLOWUP: 1. The patient to follow up with Dr. Windle Guard on November 17, 2011, at 10:00 a.m. 2. Follow up with Dr. Eden Emms, December 03, 2011, at 9:50 a.m. 3. Follow up with Dr. Riley Kill as needed.     Delle Reining, P.A.   ______________________________ Ranelle Oyster, M.D.    PL/MEDQ  D:  11/19/2011  T:  11/20/2011  Job:  [id number]  cc:   Windle Guard, M.D. Noralyn Pick. Eden Emms, MD, [facility]

## 2011-11-21 ENCOUNTER — Telehealth: Payer: Self-pay | Admitting: *Deleted

## 2011-11-21 ENCOUNTER — Telehealth: Payer: Self-pay | Admitting: Cardiovascular Disease

## 2011-11-21 ENCOUNTER — Encounter (HOSPITAL_COMMUNITY): Payer: Self-pay | Admitting: Emergency Medicine

## 2011-11-21 ENCOUNTER — Emergency Department (HOSPITAL_COMMUNITY): Payer: Medicare Other

## 2011-11-21 ENCOUNTER — Emergency Department (HOSPITAL_COMMUNITY)
Admission: EM | Admit: 2011-11-21 | Discharge: 2011-11-22 | Disposition: A | Discharge status: Home / Self Care | Payer: Medicare Other | Attending: Emergency Medicine | Admitting: Emergency Medicine

## 2011-11-21 DIAGNOSIS — Z9089 Acquired absence of other organs: Secondary | ICD-10-CM | POA: Insufficient documentation

## 2011-11-21 DIAGNOSIS — G8929 Other chronic pain: Secondary | ICD-10-CM | POA: Insufficient documentation

## 2011-11-21 DIAGNOSIS — M7989 Other specified soft tissue disorders: Secondary | ICD-10-CM | POA: Insufficient documentation

## 2011-11-21 DIAGNOSIS — E876 Hypokalemia: Secondary | ICD-10-CM | POA: Insufficient documentation

## 2011-11-21 DIAGNOSIS — Z79899 Other long term (current) drug therapy: Secondary | ICD-10-CM | POA: Insufficient documentation

## 2011-11-21 DIAGNOSIS — I5032 Chronic diastolic (congestive) heart failure: Secondary | ICD-10-CM | POA: Insufficient documentation

## 2011-11-21 DIAGNOSIS — J4489 Other specified chronic obstructive pulmonary disease: Secondary | ICD-10-CM | POA: Insufficient documentation

## 2011-11-21 DIAGNOSIS — J449 Chronic obstructive pulmonary disease, unspecified: Secondary | ICD-10-CM | POA: Insufficient documentation

## 2011-11-21 DIAGNOSIS — I350 Nonrheumatic aortic (valve) stenosis: Secondary | ICD-10-CM

## 2011-11-21 DIAGNOSIS — I359 Nonrheumatic aortic valve disorder, unspecified: Secondary | ICD-10-CM | POA: Insufficient documentation

## 2011-11-21 LAB — BASIC METABOLIC PANEL
BUN: 18 mg/dL (ref 6–23)
Chloride: 96 mEq/L (ref 96–112)
Creatinine, Ser: 1.01 mg/dL (ref 0.50–1.10)
GFR calc Af Amer: 66 mL/min — ABNORMAL LOW (ref >90)
GFR calc non Af Amer: 57 mL/min — ABNORMAL LOW (ref >90)

## 2011-11-21 LAB — CBC
HCT: 39 % (ref 36.0–46.0)
MCHC: 32.1 g/dL (ref 30.0–36.0)
MCV: 89.4 fL (ref 78.0–100.0)
Platelets: 97 10*3/uL — ABNORMAL LOW (ref 150–400)
RDW: 14.5 % (ref 11.5–15.5)
WBC: 5.3 10*3/uL (ref 4.0–10.5)

## 2011-11-21 LAB — PRO B NATRIURETIC PEPTIDE: Pro B Natriuretic peptide (BNP): 128.2 pg/mL — ABNORMAL HIGH (ref 0–125)

## 2011-11-21 MED ORDER — POTASSIUM CHLORIDE 10 MEQ/100ML IV SOLN
10.0000 meq | Freq: Once | INTRAVENOUS | Status: AC
  Administered 2011-11-21: 10 meq via INTRAVENOUS
  Filled 2011-11-21: qty 100

## 2011-11-21 MED ORDER — FUROSEMIDE 10 MG/ML IJ SOLN
60.0000 mg | Freq: Once | INTRAMUSCULAR | Status: AC
  Administered 2011-11-21: 60 mg via INTRAVENOUS
  Filled 2011-11-21: qty 6

## 2011-11-21 MED ORDER — POTASSIUM CHLORIDE CRYS ER 20 MEQ PO TBCR
60.0000 meq | EXTENDED_RELEASE_TABLET | Freq: Once | ORAL | Status: AC
  Administered 2011-11-21: 60 meq via ORAL
  Filled 2011-11-21: qty 3

## 2011-11-21 MED ORDER — POTASSIUM CHLORIDE 10 MEQ/100ML IV SOLN
10.0000 meq | Freq: Once | INTRAVENOUS | Status: AC
  Administered 2011-11-22: 10 meq via INTRAVENOUS
  Filled 2011-11-21: qty 100

## 2011-11-21 NOTE — Telephone Encounter (Signed)
New Problem:   Patient's daughter called because her mother's weight gain has exceeded 2lbs today and she would like to know how to proceed.  Please call back.

## 2011-11-21 NOTE — Telephone Encounter (Signed)
She has chronic LE venous disease that is not cardiac.  She has been Rx by IM  This is not a cardiac issue and should be taken care of by medicine

## 2011-11-21 NOTE — Telephone Encounter (Signed)
F/U  Home health calling back regarding C/O weight gain.

## 2011-11-21 NOTE — Telephone Encounter (Signed)
s/w Raynelle Fanning, [facility] who states pt has been taking lasix 80 mg TID. I gave the instructions per Tereso Newcomer, [facility] but now I was going to have to d/w him again since pt has been taking more lasix than he advised. I w/cb HHRN after I d/w PA again for further instructionss/w Tereso Newcomer, PAC about pt taking lasix 80 TID.  PA states ok if pt feels ok, no sob, edema, no trouble breathing when laying down. wants HHRN get bmet either today or 9/30. I Micah Noel [facility] tcb to go over recommendations from PA

## 2011-11-21 NOTE — Telephone Encounter (Signed)
s/w HHRN and s/w Tereso Newcomer, PAC about sending pt to the ED due to increased bilateral LE, skin on legs per [facility] is tight, thick, hard. HHRN states worried about right sided HF, pt has aortic stenosis, PA concerned to dangerous to give another 80 mg of lasix today due to AS, PA recommends pt go to ED, [facility] aware for pt to go to the ED today. Raynelle Fanning [facility] said she will let pt know of instructions from Tereso Newcomer, [facility] today.  Danielle Rankin, CMA 11/21/2011 3:51 PM Signed  s/w Raynelle Fanning [facility] who states pt has been taking lasix 80 mg TID. I gave the instructions per Tereso Newcomer, [facility] but now I was going to have to d/w him again since pt has been taking more lasix than he advised. I w/cb HHRN after I d/w PA again for further instructionss/w Tereso Newcomer, PAC about pt taking lasix 80 TID.  PA states ok if pt feels ok, no sob, edema, no trouble breathing when laying down. wants HHRN get bmet either today or 9/30. I Micah Noel [facility] tcb to go over recommendations from PA Sherrilyn Rist 11/21/2011 2:28 PM Signed  F/U  Home health calling back regarding C/O weight gain.  Paulene Floor 11/21/2011 11:39 AM Signed  New Problem:  Patient's daughter called because her mother's weight gain has exceeded 2lbs today and she would like to know how to proceed. Please call back.

## 2011-11-21 NOTE — ED Notes (Signed)
C/o sob, bilateral lower extremity edema, and abd "bloated" since this morning.  Pt reports 6lb weight gain since yesterday (235 lb yesterday and 241 lb today).

## 2011-11-21 NOTE — ED Provider Notes (Signed)
History     CSN: [id number]  Arrival date & time 11/21/11  1713   First MD Initiated Contact with Patient 11/21/11 2107      Chief Complaint  Patient presents with  . Shortness of Breath    (Consider location/radiation/quality/duration/timing/severity/associated sxs/prior treatment) Patient is a 66 y.o. female presenting with shortness of breath. The history is provided by the patient.  Shortness of Breath  The current episode started today. Associated symptoms include shortness of breath. Pertinent negatives include no chest pain.   patient has shortness of breath and swelling in her lower legs and abdomen. She's a history of CHF and this feels like that. She states she is put on 6 pounds since yesterday with weight going from 235 to 241. No cough. No chest pain. She states she does not think the Lasix is working anymore. She states that she talked to Tereso Newcomer with her cardiologist and was told to come to the ER for evaluation. She also is been told that her potassium has been low and she's been supplemented for her. She states her legs are tender.  Past Medical History  Diagnosis Date  . Carotid bruit     0-39% bilateral ICA by dopplers 03/2010  . Aortic stenosis     a. Moderate echo 01/2009, then severe 03/2010 --> last echo 09/2011.  b. Not an operative candidate due to comorbidities, not a candidate for TAVR given PVD. c. low risk myovue 11/2007 brest attenuation EF  63%  . Venous insufficiency   . Arthritis   . Depression   . COPD (chronic obstructive pulmonary disease)   . Hypothyroidism   . Thrombocytopenia   . Hypokalemia   . Edema   . Respiratory failure     Ventilator-dependent respiratory failure secondary to hypercarbia, cardiac arrest, presumed penumonia 12/2008  . Cardiac arrest     12/2010 in setting of respiratory failure with less than 15 minutes of CPR; initial rhythm was asystole  . PVD (peripheral vascular disease)     Eval by Dr. Excell Seltzer 2012: noninvasive  immaging suggested significant aortoiliac disease, ABIs in mild range  - med rx given significant comorbidities  . H/O endoscopy     For ?ampullary mass - had EGD 07/2010 with no evidence of ampullary mass, some CBD dilitation   . Chronic pain   . Lumbar stenosis     Admitted 09/2011 - not felt to be a good candidate for surgical intervention.    Past Surgical History  Procedure Date  . Thyroidectomy   . Laparoscopic cholecystectomy   . Lithotripsy      for nephrolithiasis  . Vaginal hysterectomy      for menorrhagia  at age 68  . Breast cyst excision   . Rotator cuff repair   . Cesarean section   . Laminectomy     Family History  Problem Relation Age of Onset  . Emphysema Father   . Allergies Sister   . Asthma Sister   . Heart disease Father   . Cancer Mother     History  Substance Use Topics  . Smoking status: Former Smoker -- 1.5 packs/day for 45 years  . Smokeless tobacco: Current User   Comment: PT USES VAPOR CIGARETTE  . Alcohol Use: No    OB History    Grav Para Term Preterm Abortions TAB SAB Ect Mult Living                  Review of Systems  Constitutional:  Negative for activity change and appetite change.  HENT: Negative for neck stiffness.   Eyes: Negative for pain.  Respiratory: Positive for shortness of breath. Negative for chest tightness.   Cardiovascular: Positive for leg swelling. Negative for chest pain.  Gastrointestinal: Negative for nausea, vomiting, abdominal pain and diarrhea.  Genitourinary: Negative for flank pain.  Musculoskeletal: Positive for back pain.  Skin: Negative for rash.  Neurological: Positive for weakness. Negative for numbness and headaches.  Psychiatric/Behavioral: Negative for behavioral problems.    Allergies  Latex and Penicillins  Home Medications   Current Outpatient Rx  Name Route Sig Dispense Refill  . ACETAMINOPHEN 325 MG PO TABS Oral Take 650 mg by mouth every 6 (six) hours as needed. For pain    .  FAMOTIDINE 20 MG PO TABS Oral Take 1 tablet (20 mg total) by mouth daily. For indigestion 30 tablet 1  . FLUOXETINE HCL 20 MG PO CAPS Oral Take 20 mg by mouth daily.      . FUROSEMIDE 80 MG PO TABS Oral Take 1 tablet (80 mg total) by mouth as directed. Take 1 tablet ( 80 mg) in the am take one half tablet (40 mg ) in the pm if weight is up by 2 lbs take two tablets (80 mg) twice a day 180 tablet 1  . LEVOTHYROXINE SODIUM 137 MCG PO TABS Oral Take 137 mcg by mouth daily.    Marland Kitchen METOPROLOL SUCCINATE ER 25 MG PO TB24 Oral Take 25 mg by mouth daily.    . MORPHINE SULFATE ER 60 MG PO TBCR Oral Take 120 mg by mouth 2 (two) times daily.    . MORPHINE SULFATE 15 MG PO TABS Oral Take 15 mg by mouth every 4 (four) hours as needed. pain    . SENNOSIDES-DOCUSATE SODIUM 8.6-50 MG PO TABS Oral Take 4 tablets by mouth 2 (two) times daily. For constipation.    Marland Kitchen POTASSIUM CHLORIDE CRYS ER 10 MEQ PO TBCR Oral Take 3 tablets (30 mEq total) by mouth 2 (two) times daily.      BP 102/42  Pulse 66  Temp 98.3 F (36.8 C) (Oral)  Resp 13  SpO2 99%  Physical Exam  Constitutional: She is oriented to person, place, and time. She appears well-developed and well-nourished.  HENT:  Head: Normocephalic.  Eyes: Pupils are equal, round, and reactive to light.  Cardiovascular: Normal rate and regular rhythm.   Pulmonary/Chest: Effort normal.       Harsh breath sounds without overt Rales.  Abdominal: Soft. There is tenderness.       Mild tenderness. No rebound or guarding.  Musculoskeletal: She exhibits edema.       Bilateral lower extremity edema. Pitting edema from knees down. Also chronic venous changes.  Neurological: She is alert and oriented to person, place, and time.    ED Course  Procedures (including critical care time)  Labs Reviewed  CBC - Abnormal; Notable for the following:    Platelets 97 (*)  CONSISTENT WITH PREVIOUS RESULT   All other components within normal limits  BASIC METABOLIC PANEL -  Abnormal; Notable for the following:    Potassium 2.9 (*)     CO2 34 (*)     Glucose, Bld 125 (*)     GFR calc non Af Amer 57 (*)     GFR calc Af Amer 66 (*)     All other components within normal limits  PRO B NATRIURETIC PEPTIDE - Abnormal; Notable for the following:  Pro B Natriuretic peptide (BNP) 128.2 (*)     All other components within normal limits  POCT I-STAT TROPONIN I   Dg Chest 2 View  11/21/2011  *RADIOLOGY REPORT*  Clinical Data: Shortness of breath.  Lower extremity swelling. Cough.  CHEST - 2 VIEW  Comparison: 11/05/2011.  Findings: Trachea is midline.  Surgical clips are seen in the high right paratracheal region.  Heart is enlarged, stable.  Lungs are somewhat low in volume with mild diffuse interstitial prominence. Probable bibasilar atelectasis or scarring.  No definite pleural fluid.  Minimal anterior wedging of lower thoracic vertebral bodies, as before.  IMPRESSION: Mild edema versus chronic interstitial prominence.   Original Report Authenticated By: Reyes Ivan, M.D.      1. Chronic diastolic CHF (congestive heart failure)   2. Hypokalemia   3. Aortic stenosis   4. Chronic diastolic heart failure      Date: 11/21/2011  Rate: 78  Rhythm: normal sinus rhythm  QRS Axis: normal  Intervals: normal  ST/T Wave abnormalities: normal  Conduction Disutrbances:none  Narrative Interpretation:   Old EKG Reviewed: unchanged    MDM  The patient was 6 pound weight gain. History of CHF. Sent in by cardiology. The lab work shows a hypokalemia but is otherwise reassuring. X-ray shows some mild edema. Discussed with Dr. Antoine Poche from cardiology. He recommended 80 mg of potassium and 60 mg Lasix. She will likely be able to be discharged to followup in the clinic.        Juliet Rude. Rubin Payor, MD 11/22/11 [id number]

## 2011-11-22 MED ORDER — POTASSIUM CHLORIDE CRYS ER 10 MEQ PO TBCR: 30.0000 meq | EXTENDED_RELEASE_TABLET | Freq: Two times a day (BID) | ORAL | Status: DC

## 2011-11-28 NOTE — Telephone Encounter (Signed)
F/u  Please advise if patient need to be continue see [facility] or not. If so, need more order and reason.

## 2011-12-01 ENCOUNTER — Ambulatory Visit (INDEPENDENT_AMBULATORY_CARE_PROVIDER_SITE_OTHER): Payer: Medicare Other | Admitting: Physician Assistant

## 2011-12-01 ENCOUNTER — Telehealth: Payer: Self-pay | Admitting: *Deleted

## 2011-12-01 ENCOUNTER — Encounter: Payer: Self-pay | Admitting: Physician Assistant

## 2011-12-01 VITALS — BP 162/72 | HR 82 | Ht 60.0 in | Wt 251.0 lb

## 2011-12-01 DIAGNOSIS — I5032 Chronic diastolic (congestive) heart failure: Secondary | ICD-10-CM

## 2011-12-01 DIAGNOSIS — I359 Nonrheumatic aortic valve disorder, unspecified: Secondary | ICD-10-CM

## 2011-12-01 DIAGNOSIS — R609 Edema, unspecified: Secondary | ICD-10-CM

## 2011-12-01 DIAGNOSIS — I350 Nonrheumatic aortic (valve) stenosis: Secondary | ICD-10-CM

## 2011-12-01 LAB — BASIC METABOLIC PANEL
BUN: 18 mg/dL (ref 6–23)
CO2: 33 mEq/L — ABNORMAL HIGH (ref 19–32)
Calcium: 8.5 mg/dL (ref 8.4–10.5)
Creatinine, Ser: 0.9 mg/dL (ref 0.4–1.2)
GFR: 65.9 mL/min (ref >60.00)
Glucose, Bld: 126 mg/dL — ABNORMAL HIGH (ref 70–99)

## 2011-12-01 MED ORDER — POTASSIUM CHLORIDE CRYS ER 20 MEQ PO TBCR: 40.0000 meq | EXTENDED_RELEASE_TABLET | ORAL | Status: DC

## 2011-12-01 MED ORDER — FUROSEMIDE 80 MG PO TABS: 80.0000 mg | ORAL_TABLET | ORAL | Status: DC

## 2011-12-01 MED ORDER — METOLAZONE 2.5 MG PO TABS: 2.5000 mg | ORAL_TABLET | ORAL | Status: DC

## 2011-12-01 NOTE — Patient Instructions (Signed)
Your physician has recommended you make the following change in your medication:  1.) START METOLAZONE ONE TABLET (2.5 MG) ONE Monday,WEDNESDAY,FRIDAY WITH YOUR AM LASIX 2.) CHANGE POTASSIUM TO TWO TABLETS ( 40 MEQ) TWICE A DAY ON Monday,WEDNESDAY, AND Friday AND CONTINUE ONE AND ONE HALF TABLET ( 30 MEQ) ALL OTHER DAYS   Your physician recommends that you HAVE lab work TODAY: bmet   Your physician recommends that you HAVE lab work in: ONE WEEK WITH YOUR AT HOME NURSE AND FAX THE RESULTS TO OUR OFFICE   Your physician recommends that you schedule a follow-up appointment in: 6-8 WEEKS WITH DR. Eden Emms   2 Gram Low Sodium Diet A 2 gram sodium diet restricts the amount of sodium in the diet to no more than 2 g or 2000 mg daily. Limiting the amount of sodium is often used to help lower blood pressure. It is important if you have heart, liver, or kidney problems. Many foods contain sodium for flavor and sometimes as a preservative. When the amount of sodium in a diet needs to be low, it is important to know what to look for when choosing foods and drinks. The following includes some information and guidelines to help make it easier for you to adapt to a low sodium diet. QUICK TIPS  Do not add salt to food.  Avoid convenience items and fast food.  Choose unsalted snack foods.  Buy lower sodium products, often labeled as "lower sodium" or "no salt added."  Check food labels to learn how much sodium is in 1 serving.  When eating at a restaurant, ask that your food be prepared with less salt or none, if possible. READING FOOD LABELS FOR SODIUM INFORMATION The nutrition facts label is a good place to find how much sodium is in foods. Look for products with no more than 500 to 600 mg of sodium per meal and no more than 150 mg per serving. Remember that 2 g = 2000 mg. The food label may also list foods as:  Sodium-free: Less than 5 mg in a serving.  Very low sodium: 35 mg or less in a  serving.  Low-sodium: 140 mg or less in a serving.  Light in sodium: 50% less sodium in a serving. For example, if a food that usually has 300 mg of sodium is changed to become light in sodium, it will have 150 mg of sodium.  Reduced sodium: 25% less sodium in a serving. For example, if a food that usually has 400 mg of sodium is changed to reduced sodium, it will have 300 mg of sodium. CHOOSING FOODS Grains  Avoid: Salted crackers and snack items. Some cereals, including instant hot cereals. Bread stuffing and biscuit mixes. Seasoned rice or pasta mixes.  Choose: Unsalted snack items. Low-sodium cereals, oats, puffed wheat and rice, shredded wheat. English muffins and bread. Pasta. Meats  Avoid: Salted, canned, smoked, spiced, pickled meats, including fish and poultry. Bacon, ham, sausage, cold cuts, hot dogs, anchovies.  Choose: Low-sodium canned tuna and salmon. Fresh or frozen meat, poultry, and fish. Dairy  Avoid: Processed cheese and spreads. Cottage cheese. Buttermilk and condensed milk. Regular cheese.  Choose: Milk. Low-sodium cottage cheese. Yogurt. Sour cream. Low-sodium cheese. Fruits and Vegetables  Avoid: Regular canned vegetables. Regular canned tomato sauce and paste. Frozen vegetables in sauces. Olives. Rosita Fire. Relishes. Sauerkraut.  Choose: Low-sodium canned vegetables. Low-sodium tomato sauce and paste. Frozen or fresh vegetables. Fresh and frozen fruit. Condiments  Avoid: Canned and packaged gravies.  Worcestershire sauce. Tartar sauce. Barbecue sauce. Soy sauce. Steak sauce. Ketchup. Onion, garlic, and table salt. Meat flavorings and tenderizers.  Choose: Fresh and dried herbs and spices. Low-sodium varieties of mustard and ketchup. Lemon juice. Tabasco sauce. Horseradish. SAMPLE 2 GRAM SODIUM MEAL PLAN Breakfast / Sodium (mg)  1 cup low-fat milk / 143 mg  2 slices whole-wheat toast / 270 mg  1 tbs heart-healthy margarine / 153 mg  1 hard-boiled egg /  139 mg  1 small orange / 0 mg Lunch / Sodium (mg)  1 cup raw carrots / 76 mg   cup hummus / 298 mg  1 cup low-fat milk / 143 mg   cup red grapes / 2 mg  1 whole-wheat pita bread / 356 mg Dinner / Sodium (mg)  1 cup whole-wheat pasta / 2 mg  1 cup low-sodium tomato sauce / 73 mg  3 oz lean ground beef / 57 mg  1 small side salad (1 cup raw spinach leaves,  cup cucumber,  cup yellow bell pepper) with 1 tsp olive oil and 1 tsp red wine vinegar / 25 mg Snack / Sodium (mg)  1 container low-fat vanilla yogurt / 107 mg  3 graham cracker squares / 127 mg Nutrient Analysis  Calories: 2033  Protein: 77 g  Carbohydrate: 282 g  Fat: 72 g  Sodium: 1971 mg Document Released: 02/10/2005 Document Revised: 05/05/2011 Document Reviewed: 05/14/2009 [facility] Patient Information 2013 [place], [place].

## 2011-12-01 NOTE — Telephone Encounter (Signed)
s/w about lab results and when I tried to give the pt her new instrcutions on her K+ she seemed confused so I asked to s/w her husband, but husband not available, I said I w/cb tomorrow 10/8

## 2011-12-01 NOTE — Telephone Encounter (Signed)
Message copied by Tarri Fuller on Mon Dec 01, 2011  5:49 PM ------      Message from: [place], [place] T      Created: Mon Dec 01, 2011  5:18 PM       K+ too low       Increase to 40 mEq bid every day.      She should take 60 mEq bid on Mon, Wed, Fri (days she takes Metolazone).      Check BMET in one week.      Tereso Newcomer, PA-C  5:17 PM 12/01/2011

## 2011-12-01 NOTE — Progress Notes (Signed)
[address] [place], [place]  [address] Phone: [phone] Fax:  [phone]  Date:  12/01/2011   Name:  [name]   DOB:  [birth year]   MRN:  [id number]  PCP:  [name], MD  Primary Cardiologist:  Dr. Charlton Haws  PV:  Dr. Tonny Bollman  Primary Electrophysiologist:  None    History of Present Illness: [name] is a 66 y.o. female who returns for follow up.  She has a history of COPD with previous VDRF in 2010, chronic thrombocytopenia, PVD, chronic diastolic CHF, RLE cellulitis, PAD, and aortic stenosis.  Cardiac catheterization 12/10: Normal coronary arteries.  She is not an operative or TAVR candidate for treatment of her AS due to co-morbidities.    Admitted to [facility] [phone] with lower extremity numbness and pain secondary to significant lumbar stenosis. She was not felt to be a surgical candidate. Echocardiogram confirmed normal LVF and severe AS. Patient did have volume overload and required IV Lasix at times. Cardiology followed her for this problem.  I saw her in follow up 11/13/11.  I cut back on her Lasix. Patient is followed by home health. The home health nurse called in about 10 days later noting lower extremity edema. The patient had increase her Lasix to 80 mg 3 times a day on her own. She ultimately went to the emergency room. Pro BNP was 128.  CXR with mild edema vs chronic interstitial prominence.  She was given some extra K+ and Lasix in the emergency room and d/c to home.  Breathing is stable.  No chest pain.  No PND.  She sleeps on an incline without change.  No cough.  Concerned as weight has gone up 3 lbs over last 3 days.    Labs (11/06/11): K 4, creatinine 0.91 Labs (11/21/11): K 2.9, creatinine 1.01, Hgb 12.5, Plt 97K,    Wt Readings from Last 3 Encounters:  12/01/11 251 lb (113.853 kg)  11/13/11 245 lb 12.8 oz (111.494 kg)  11/07/11 260 lb 2.3 oz (118 kg)     Past Medical History  Diagnosis Date  . Carotid  bruit     0-39% bilateral ICA by dopplers 03/2010  . Aortic stenosis     a. Moderate echo 01/2009, then severe 03/2010 --> last echo 09/2011:  EF 60-65%, grade 2 diastolic dysfunction, severe aortic stenosis, mean gradient 50, mild MR    b. Not an operative candidate due to comorbidities, not a candidate for TAVR given PVD. c. low risk myovue 11/2007 brest attenuation EF  63%  . Venous insufficiency   . Arthritis   . Depression   . COPD (chronic obstructive pulmonary disease)   . Hypothyroidism   . Thrombocytopenia   . Hypokalemia   . Edema   . Respiratory failure     Ventilator-dependent respiratory failure secondary to hypercarbia, cardiac arrest, presumed penumonia 12/2008  . Cardiac arrest     12/2010 in setting of respiratory failure with less than 15 minutes of CPR; initial rhythm was asystole  . PVD (peripheral vascular disease)     Eval by Dr. Excell Seltzer 2012: noninvasive immaging suggested significant aortoiliac disease, ABIs in mild range  - med rx given significant comorbidities  . H/O endoscopy     For ?ampullary mass - had EGD 07/2010 with no evidence of ampullary mass, some CBD dilitation   . Chronic pain   . Lumbar stenosis     Admitted 09/2011 - not felt to be a  good candidate for surgical intervention.    Current Outpatient Prescriptions  Medication Sig Dispense Refill  . acetaminophen (TYLENOL) 325 MG tablet Take 650 mg by mouth every 6 (six) hours as needed. For pain      . famotidine (PEPCID) 20 MG tablet Take 1 tablet (20 mg total) by mouth daily. For indigestion  30 tablet  1  . FLUoxetine (PROZAC) 20 MG capsule Take 20 mg by mouth daily.        . furosemide (LASIX) 80 MG tablet Take 1 tablet (80 mg total) by mouth as directed. Take 1 tablet ( 80 mg) in the am take one half tablet (40 mg ) in the pm if weight is up by 2 lbs take two tablets (80 mg) twice a day  180 tablet  1  . levothyroxine (SYNTHROID, LEVOTHROID) 137 MCG tablet Take 137 mcg by mouth daily.      .  metoprolol succinate (TOPROL-XL) 25 MG 24 hr tablet Take 25 mg by mouth daily.      Marland Kitchen morphine (MS CONTIN) 60 MG 12 hr tablet Take 120 mg by mouth 2 (two) times daily.      Marland Kitchen morphine (MSIR) 15 MG tablet Take 15 mg by mouth every 4 (four) hours as needed. pain      . potassium chloride (K-DUR,KLOR-CON) 10 MEQ tablet Take 3 tablets (30 mEq total) by mouth 2 (two) times daily.  30 tablet  0  . senna-docusate (SENOKOT-S) 8.6-50 MG per tablet Take 4 tablets by mouth 2 (two) times daily. For constipation.        Allergies: Allergies  Allergen Reactions  . Latex     REACTION: itch  . Penicillins     REACTION: hives    History  Substance Use Topics  . Smoking status: Former Smoker -- 1.5 packs/day for 45 years  . Smokeless tobacco: Current User   Comment: PT USES VAPOR CIGARETTE  . Alcohol Use: No     ROS:  Please see the history of present illness.     All other systems reviewed and negative.   PHYSICAL EXAM: VS:  BP 162/72  Pulse 82  Ht 5' (1.524 m)  Wt 251 lb (113.853 kg)  BMI 49.02 kg/m2 Well nourished, well developed, in no acute distress HEENT: normal Neck: no JVD  Cardiac:  normal S1, diminished S2; RRR; 2/6 harsh systolic murmur heard best at the RUSB Lungs:  clear to auscultation bilaterally, no wheezing, rhonchi or rales Abd: soft, nontender, no hepatomegaly Ext: 2+ brawny bilateral LE edema Skin: warm and dry Neuro:  CNs 2-12 intact, no focal abnormalities noted  EKG:  NSR, HR 82, LAD, LVH, no change from prior tracing  ASSESSMENT AND PLAN:  1. Chronic Diastolic CHF:  She has chronic volume overload.  She also has chronic pedal edema that is likely multifactorial.  She had some edema on her CXR when she went to the ED.  I will add metolazone 2.5 mg daily on Mon, Wed, Fri only.  She will take K+ 40 mEq bid on M, W, F only and continue 30 mEq bid on all other days.  Check a BMET today and repeat a bmet in a week.  She has been advised to keep her legs elevated.  I will  also give her a low sodium diet to follow.  2. Severe Aortic Stenosis:  She is not an operative candidate and this has been d/w the patient in the past.  I reviewed this again with  the patient and her husband today.   3. COPD:  She is on home O2.    4. Lumbar Stenosis:  She has HHPT.  She is not an operative candidate.    Signed, Tereso Newcomer, PA-C  2:53 PM 12/01/2011

## 2011-12-02 ENCOUNTER — Other Ambulatory Visit: Payer: Self-pay | Admitting: *Deleted

## 2011-12-02 ENCOUNTER — Telehealth: Payer: Self-pay | Admitting: Cardiovascular Disease

## 2011-12-02 ENCOUNTER — Telehealth: Payer: Self-pay | Admitting: *Deleted

## 2011-12-02 DIAGNOSIS — I5032 Chronic diastolic (congestive) heart failure: Secondary | ICD-10-CM

## 2011-12-02 DIAGNOSIS — I350 Nonrheumatic aortic (valve) stenosis: Secondary | ICD-10-CM

## 2011-12-02 DIAGNOSIS — I359 Nonrheumatic aortic valve disorder, unspecified: Secondary | ICD-10-CM

## 2011-12-02 MED ORDER — POTASSIUM CHLORIDE CRYS ER 20 MEQ PO TBCR: EXTENDED_RELEASE_TABLET | ORAL | Status: DC

## 2011-12-02 NOTE — Telephone Encounter (Signed)
Called patient to make her aware of upcoming TAVR appt with Dr. Excell Seltzer & Dr. Cornelius Moras on Friday 12/26/11.  Answered all her questions.

## 2011-12-02 NOTE — Telephone Encounter (Signed)
Advised husband.  Did call [facility] and was told nursing no longer going out to visit patient.  Spoke further with the husband and he was told by patients nurse Reine Just that she would be back out.  Nurse is a neighbor so he will look into this and call back this afternoon or tomorrow.

## 2011-12-02 NOTE — Telephone Encounter (Signed)
Pt was told dr Fabio Bering nurse would call her first thing this am to talk to her husband re how pt to take meds, I told her she was not in, wants a nurse to call him at [phone] [facility]

## 2011-12-03 ENCOUNTER — Telehealth: Payer: Self-pay | Admitting: Cardiovascular Disease

## 2011-12-03 NOTE — Telephone Encounter (Signed)
Home health care needs more order to see pt once or twice a week for at least two to three more weeks, also needs labs, verbal order [phone] julie, said requested last week as well

## 2011-12-03 NOTE — Telephone Encounter (Signed)
Left message for [facility] nurse Raynelle Fanning to call back.  Spoke with husband and he is still under the impression from nurse that patient needs continued visits

## 2011-12-03 NOTE — Telephone Encounter (Signed)
LMTCB ./CY 

## 2011-12-04 NOTE — Telephone Encounter (Signed)
LMTCB ./CY 

## 2011-12-04 NOTE — Telephone Encounter (Signed)
Nurse called Wynona Canes nurse with [facility] back earlier, will forward message to her to make sure labs are done next week

## 2011-12-05 ENCOUNTER — Telehealth: Payer: Self-pay | Admitting: Cardiovascular Disease

## 2011-12-05 NOTE — Telephone Encounter (Signed)
Raynelle Fanning with [facility] rtn your call

## 2011-12-05 NOTE — Telephone Encounter (Signed)
JULIE AWARE  MAY CONT TO  DO HOME VISITS  FOR COUPLE MORE WEEKS   AND ALSO PT NEEDS REPEAT BMET ON  MON 12-08-11  .Zack Seal

## 2011-12-05 NOTE — Telephone Encounter (Deleted)
[name] with [facility] calling to speak with you re this pt

## 2011-12-08 ENCOUNTER — Encounter: Payer: Self-pay | Admitting: Cardiovascular Disease

## 2011-12-08 NOTE — Telephone Encounter (Signed)
PT'S HUSBAND AWARE  WILL REVIEW LAB RESULTS  FROM TODAY AND MAY ADJUST MEDS AT THAT TIME .Zack Seal

## 2011-12-08 NOTE — Telephone Encounter (Signed)
F/u   plz return call to patient husband Lorella Nimrod [phone] concerning pt lasix  Levels and potassium ck.

## 2011-12-09 ENCOUNTER — Telehealth: Payer: Self-pay | Admitting: Cardiovascular Disease

## 2011-12-09 NOTE — Telephone Encounter (Signed)
plz return call to pt husband, he is very concerned with patients fatigue.  She has not been out of bed in the last 3 days, husband recvd a call around midnight with instructions to increase pt potassium as it was at a dangerous level.  Pt husband can be reached at [phone]

## 2011-12-09 NOTE — Telephone Encounter (Signed)
SPOKE WITH PT'S HUSBAND WAS GIVEN NEW INSTRUCTIONS LAST NIGHT FOR K SUPPLELMENT RE   CRITICAL LOW K  OF 2.4  PT HAS CONCERNS  WITH   WIFE'S LACK OF ENERGY  PER HUSBAND  PT WEIGHED AS MUCH AS 235  AND NOW WEIGHS  218   HOME HEALTH NURSE   WAS THERE ON MON FOR LAB DRAW  THIS  NURSE CALLED AND SPOKE WITH JULIE FROM [facility]   PER JULIE  PT APPEARED TO DO  AS MUCH AS NORMALLY DOES WHILE VISITING  NOTHING NOTED DIFFERENT WILL FORWARD TO DR Eden Emms FOR REVIEW. DISCUSSED WITH SCOTT WEAVER PAC PER SCOTT  D/C METALOZONE  INCREASE K  TO 60 MEQ BID  CHECK BMET  ON 10-16  AND CALL OFFICE IF NOTES  3 LB WEIGHT  GAIN IN  1 DAY  PT'S HUSBAND VERBALIZED UNDERSTANDING./CY

## 2011-12-10 MED ORDER — SPIRONOLACTONE 50 MG PO TABS: 50.0000 mg | ORAL_TABLET | Freq: Every day | ORAL | Status: DC

## 2011-12-10 NOTE — Telephone Encounter (Signed)
New problem:  C/o discontinue medication - metolazone 2.5 mg  sick, nausea, haven't eaten in 3 days, weak,

## 2011-12-10 NOTE — Telephone Encounter (Signed)
SPOKE WITH HUSBAND RE  LABS FROM TODAY   K +  NOW 2.7   DISCUSSED WITH SCOTT  PLAN IS  AS DR Eden Emms   NOTED ON LAB RESULT FROM 11-28-11  DC METOLAZONE   CONT WITH  60 MEQ K+ BID FOR TODAY AND TOMORROW   THEN STOP AFTER  THURS  START SPIRONOLACTONE 50 MG  EVERY DAY AND REPEAT BMET IN  1 WEEK ./CY PT'S HUSBAND OF  MED CHANGES the patient'S HUSBAND CALLED EARLIER RE WIFE  FEELING NAUSEATED   WAS ABLE TO  REACH PMD    RECEI VED SCRIPT FOR [facility]

## 2011-12-11 ENCOUNTER — Inpatient Hospital Stay (HOSPITAL_COMMUNITY)
Admission: EM | Admit: 2011-12-11 | Discharge: 2011-12-14 | DRG: 071 | Disposition: A | Discharge status: Home Health | Payer: Medicare Other | Attending: Internal Medicine | Admitting: Internal Medicine

## 2011-12-11 ENCOUNTER — Encounter (HOSPITAL_COMMUNITY): Payer: Self-pay | Admitting: Nurse Practitioner

## 2011-12-11 ENCOUNTER — Inpatient Hospital Stay (HOSPITAL_COMMUNITY): Payer: Medicare Other

## 2011-12-11 ENCOUNTER — Emergency Department (HOSPITAL_COMMUNITY): Payer: Medicare Other

## 2011-12-11 DIAGNOSIS — G9340 Encephalopathy, unspecified: Principal | ICD-10-CM

## 2011-12-11 DIAGNOSIS — I739 Peripheral vascular disease, unspecified: Secondary | ICD-10-CM

## 2011-12-11 DIAGNOSIS — J4489 Other specified chronic obstructive pulmonary disease: Secondary | ICD-10-CM

## 2011-12-11 DIAGNOSIS — Z6841 Body Mass Index (BMI) 40.0 and over, adult: Secondary | ICD-10-CM

## 2011-12-11 DIAGNOSIS — R531 Weakness: Secondary | ICD-10-CM

## 2011-12-11 DIAGNOSIS — M48061 Spinal stenosis, lumbar region without neurogenic claudication: Secondary | ICD-10-CM

## 2011-12-11 DIAGNOSIS — I359 Nonrheumatic aortic valve disorder, unspecified: Secondary | ICD-10-CM

## 2011-12-11 DIAGNOSIS — I872 Venous insufficiency (chronic) (peripheral): Secondary | ICD-10-CM

## 2011-12-11 DIAGNOSIS — I70209 Unspecified atherosclerosis of native arteries of extremities, unspecified extremity: Secondary | ICD-10-CM | POA: Diagnosis present

## 2011-12-11 DIAGNOSIS — R29898 Other symptoms and signs involving the musculoskeletal system: Secondary | ICD-10-CM

## 2011-12-11 DIAGNOSIS — I5032 Chronic diastolic (congestive) heart failure: Secondary | ICD-10-CM

## 2011-12-11 DIAGNOSIS — K5900 Constipation, unspecified: Secondary | ICD-10-CM | POA: Diagnosis present

## 2011-12-11 DIAGNOSIS — J449 Chronic obstructive pulmonary disease, unspecified: Secondary | ICD-10-CM | POA: Diagnosis present

## 2011-12-11 DIAGNOSIS — R0989 Other specified symptoms and signs involving the circulatory and respiratory systems: Secondary | ICD-10-CM

## 2011-12-11 DIAGNOSIS — F17200 Nicotine dependence, unspecified, uncomplicated: Secondary | ICD-10-CM | POA: Diagnosis present

## 2011-12-11 DIAGNOSIS — E669 Obesity, unspecified: Secondary | ICD-10-CM | POA: Diagnosis present

## 2011-12-11 DIAGNOSIS — E162 Hypoglycemia, unspecified: Secondary | ICD-10-CM | POA: Diagnosis present

## 2011-12-11 DIAGNOSIS — Z90710 Acquired absence of both cervix and uterus: Secondary | ICD-10-CM

## 2011-12-11 DIAGNOSIS — F3289 Other specified depressive episodes: Secondary | ICD-10-CM

## 2011-12-11 DIAGNOSIS — Z825 Family history of asthma and other chronic lower respiratory diseases: Secondary | ICD-10-CM

## 2011-12-11 DIAGNOSIS — D696 Thrombocytopenia, unspecified: Secondary | ICD-10-CM

## 2011-12-11 DIAGNOSIS — J438 Other emphysema: Secondary | ICD-10-CM

## 2011-12-11 DIAGNOSIS — Z880 Allergy status to penicillin: Secondary | ICD-10-CM

## 2011-12-11 DIAGNOSIS — N390 Urinary tract infection, site not specified: Secondary | ICD-10-CM

## 2011-12-11 DIAGNOSIS — L0390 Cellulitis, unspecified: Secondary | ICD-10-CM

## 2011-12-11 DIAGNOSIS — Z8249 Family history of ischemic heart disease and other diseases of the circulatory system: Secondary | ICD-10-CM

## 2011-12-11 DIAGNOSIS — Z66 Do not resuscitate: Secondary | ICD-10-CM | POA: Diagnosis present

## 2011-12-11 DIAGNOSIS — J9610 Chronic respiratory failure, unspecified whether with hypoxia or hypercapnia: Secondary | ICD-10-CM

## 2011-12-11 DIAGNOSIS — I509 Heart failure, unspecified: Secondary | ICD-10-CM

## 2011-12-11 DIAGNOSIS — E890 Postprocedural hypothyroidism: Secondary | ICD-10-CM | POA: Diagnosis present

## 2011-12-11 DIAGNOSIS — F329 Major depressive disorder, single episode, unspecified: Secondary | ICD-10-CM | POA: Diagnosis present

## 2011-12-11 DIAGNOSIS — R609 Edema, unspecified: Secondary | ICD-10-CM

## 2011-12-11 DIAGNOSIS — E039 Hypothyroidism, unspecified: Secondary | ICD-10-CM

## 2011-12-11 DIAGNOSIS — E860 Dehydration: Secondary | ICD-10-CM

## 2011-12-11 DIAGNOSIS — Z9089 Acquired absence of other organs: Secondary | ICD-10-CM

## 2011-12-11 DIAGNOSIS — R9431 Abnormal electrocardiogram [ECG] [EKG]: Secondary | ICD-10-CM | POA: Diagnosis present

## 2011-12-11 DIAGNOSIS — G459 Transient cerebral ischemic attack, unspecified: Secondary | ICD-10-CM

## 2011-12-11 DIAGNOSIS — I5033 Acute on chronic diastolic (congestive) heart failure: Secondary | ICD-10-CM

## 2011-12-11 DIAGNOSIS — E876 Hypokalemia: Secondary | ICD-10-CM

## 2011-12-11 DIAGNOSIS — M129 Arthropathy, unspecified: Secondary | ICD-10-CM

## 2011-12-11 HISTORY — DX: Heart failure, unspecified: I50.9

## 2011-12-11 LAB — HEPATIC FUNCTION PANEL
ALT: 22 U/L (ref 0–35)
Alkaline Phosphatase: 200 U/L — ABNORMAL HIGH (ref 39–117)
Bilirubin, Direct: 0.8 mg/dL — ABNORMAL HIGH (ref 0.0–0.3)
Indirect Bilirubin: 1.3 mg/dL — ABNORMAL HIGH (ref 0.3–0.9)
Total Bilirubin: 2.1 mg/dL — ABNORMAL HIGH (ref 0.3–1.2)

## 2011-12-11 LAB — URINE MICROSCOPIC-ADD ON

## 2011-12-11 LAB — URINALYSIS, ROUTINE W REFLEX MICROSCOPIC
Bilirubin Urine: NEGATIVE
Nitrite: NEGATIVE
Specific Gravity, Urine: 1.012 (ref 1.005–1.030)
pH: 7 (ref 5.0–8.0)

## 2011-12-11 LAB — CBC
Hemoglobin: 16.1 g/dL — ABNORMAL HIGH (ref 12.0–15.0)
MCH: 29.1 pg (ref 26.0–34.0)
RBC: 5.54 MIL/uL — ABNORMAL HIGH (ref 3.87–5.11)

## 2011-12-11 LAB — BLOOD GAS, ARTERIAL
Acid-Base Excess: 16.2 mmol/L — ABNORMAL HIGH (ref 0.0–2.0)
Drawn by: 31843
O2 Content: 2 L/min
O2 Saturation: 94 %
Patient temperature: 98.6
pCO2 arterial: 56.3 mmHg — ABNORMAL HIGH (ref 35.0–45.0)

## 2011-12-11 LAB — GLUCOSE, CAPILLARY

## 2011-12-11 LAB — BASIC METABOLIC PANEL
BUN: 36 mg/dL — ABNORMAL HIGH (ref 6–23)
Calcium: 9.4 mg/dL (ref 8.4–10.5)
Creatinine, Ser: 1.23 mg/dL — ABNORMAL HIGH (ref 0.50–1.10)
GFR calc Af Amer: 52 mL/min — ABNORMAL LOW (ref >90)
GFR calc non Af Amer: 45 mL/min — ABNORMAL LOW (ref >90)
Glucose, Bld: 206 mg/dL — ABNORMAL HIGH (ref 70–99)

## 2011-12-11 LAB — POCT I-STAT TROPONIN I

## 2011-12-11 MED ORDER — DEXTROSE 5 % IV SOLN
1.0000 g | INTRAVENOUS | Status: DC
  Administered 2011-12-11 – 2011-12-12 (×2): 1 g via INTRAVENOUS
  Filled 2011-12-11 (×3): qty 10

## 2011-12-11 MED ORDER — SODIUM CHLORIDE 0.9 % IV BOLUS (SEPSIS)
500.0000 mL | Freq: Once | INTRAVENOUS | Status: AC
  Administered 2011-12-11: 500 mL via INTRAVENOUS

## 2011-12-11 MED ORDER — MORPHINE SULFATE 2 MG/ML IJ SOLN
2.0000 mg | INTRAMUSCULAR | Status: DC | PRN
  Administered 2011-12-12: 2 mg via INTRAVENOUS
  Filled 2011-12-11: qty 1

## 2011-12-11 MED ORDER — ASPIRIN 300 MG RE SUPP
300.0000 mg | Freq: Every day | RECTAL | Status: DC
  Administered 2011-12-11: 300 mg via RECTAL
  Filled 2011-12-11 (×2): qty 1

## 2011-12-11 MED ORDER — SODIUM CHLORIDE 0.9 % IJ SOLN
3.0000 mL | Freq: Two times a day (BID) | INTRAMUSCULAR | Status: DC
  Administered 2011-12-12 – 2011-12-13 (×4): 3 mL via INTRAVENOUS

## 2011-12-11 MED ORDER — POTASSIUM CHLORIDE 10 MEQ/100ML IV SOLN
10.0000 meq | Freq: Once | INTRAVENOUS | Status: AC
  Administered 2011-12-11: 10 meq via INTRAVENOUS
  Filled 2011-12-11: qty 100

## 2011-12-11 MED ORDER — POTASSIUM CHLORIDE CRYS ER 20 MEQ PO TBCR: 40.0000 meq | EXTENDED_RELEASE_TABLET | Freq: Once | ORAL | Status: DC

## 2011-12-11 MED ORDER — ACETAMINOPHEN 650 MG RE SUPP
650.0000 mg | Freq: Four times a day (QID) | RECTAL | Status: DC | PRN
  Administered 2011-12-12: 650 mg via RECTAL
  Filled 2011-12-11: qty 1

## 2011-12-11 MED ORDER — ACETAMINOPHEN 325 MG PO TABS: 650.0000 mg | ORAL_TABLET | Freq: Four times a day (QID) | ORAL | Status: DC | PRN

## 2011-12-11 MED ORDER — ONDANSETRON HCL 4 MG PO TABS: 4.0000 mg | ORAL_TABLET | Freq: Four times a day (QID) | ORAL | Status: DC | PRN

## 2011-12-11 MED ORDER — POTASSIUM CHLORIDE 10 MEQ/100ML IV SOLN
10.0000 meq | INTRAVENOUS | Status: AC
  Administered 2011-12-11 – 2011-12-12 (×4): 10 meq via INTRAVENOUS
  Filled 2011-12-11 (×4): qty 100

## 2011-12-11 MED ORDER — ALBUTEROL SULFATE (5 MG/ML) 0.5% IN NEBU: 2.5000 mg | INHALATION_SOLUTION | RESPIRATORY_TRACT | Status: DC | PRN

## 2011-12-11 MED ORDER — LACTULOSE ENEMA
300.0000 mL | Freq: Once | ORAL | Status: AC
  Administered 2011-12-11: 300 mL via RECTAL
  Filled 2011-12-11: qty 300

## 2011-12-11 MED ORDER — ONDANSETRON HCL 4 MG/2ML IJ SOLN: 4.0000 mg | Freq: Four times a day (QID) | INTRAMUSCULAR | Status: DC | PRN

## 2011-12-11 MED ORDER — ENOXAPARIN SODIUM 40 MG/0.4ML ~~LOC~~ SOLN
40.0000 mg | SUBCUTANEOUS | Status: DC
  Administered 2011-12-11 – 2011-12-12 (×2): 40 mg via SUBCUTANEOUS
  Filled 2011-12-11 (×3): qty 0.4

## 2011-12-11 MED ORDER — INSULIN ASPART 100 UNIT/ML ~~LOC~~ SOLN
0.0000 [IU] | SUBCUTANEOUS | Status: DC
  Administered 2011-12-11 (×2): 1 [IU] via SUBCUTANEOUS
  Administered 2011-12-12: 3 [IU] via SUBCUTANEOUS
  Administered 2011-12-12 (×3): 1 [IU] via SUBCUTANEOUS
  Administered 2011-12-13: 2 [IU] via SUBCUTANEOUS
  Administered 2011-12-13 (×2): 1 [IU] via SUBCUTANEOUS
  Administered 2011-12-13: 2 [IU] via SUBCUTANEOUS
  Administered 2011-12-13: 1 [IU] via SUBCUTANEOUS
  Administered 2011-12-13: 2 [IU] via SUBCUTANEOUS
  Administered 2011-12-13: 1 [IU] via SUBCUTANEOUS
  Administered 2011-12-14: 2 [IU] via SUBCUTANEOUS
  Administered 2011-12-14: 1 [IU] via SUBCUTANEOUS

## 2011-12-11 MED ORDER — POTASSIUM CHLORIDE IN NACL 20-0.9 MEQ/L-% IV SOLN
INTRAVENOUS | Status: AC
  Administered 2011-12-11: 22:00:00 via INTRAVENOUS
  Filled 2011-12-11: qty 1000

## 2011-12-11 NOTE — ED Notes (Signed)
Family at bedside. 

## 2011-12-11 NOTE — ED Notes (Addendum)
Patient presented with weakness, decreased appetite since Friday last week. Family states that she has been slow in her responses and she has had visual changes. Patient has had a CVA in the past and this is not her normal state.

## 2011-12-11 NOTE — ED Provider Notes (Signed)
History     CSN: [id number]  Arrival date & time 12/11/11  1324   First MD Initiated Contact with Patient 12/11/11 1511      Chief Complaint  Patient presents with  . Fatigue    (Consider location/radiation/quality/duration/timing/severity/associated sxs/prior treatment) HPI Comments: Patient with extensive pmh including CHF, hypokalemia.  Presents for eval of increasing shortness of breath, decreased responsiveness, weakness for the past several days.  The husband thought this was related to chf and was given 2 doses of zaroxolyn.  He felt this made things worse, called the cardiologist and was changed to spironolactone.  She has continued to show progressive weakness, unsteadiness, and slurred speech.  Patient is a 66 y.o. female presenting with weakness. The history is provided by the patient.  Weakness The primary symptoms include altered mental status and speech change. Primary symptoms do not include fever. Episode onset: 5 days ago. The symptoms are worsening. The neurological symptoms are diffuse.  Additional symptoms include weakness.    Past Medical History  Diagnosis Date  . Carotid bruit     0-39% bilateral ICA by dopplers 03/2010  . Aortic stenosis     a. Moderate echo 01/2009, then severe 03/2010 --> last echo 09/2011:  EF 60-65%, grade 2 diastolic dysfunction, severe aortic stenosis, mean gradient 50, mild MR    b. Not an operative candidate due to comorbidities, not a candidate for TAVR given PVD. c. low risk myovue 11/2007 brest attenuation EF  63%  . Venous insufficiency   . Arthritis   . Depression   . COPD (chronic obstructive pulmonary disease)   . Hypothyroidism   . Thrombocytopenia   . Hypokalemia   . Edema   . Respiratory failure     Ventilator-dependent respiratory failure secondary to hypercarbia, cardiac arrest, presumed penumonia 12/2008  . Cardiac arrest     12/2010 in setting of respiratory failure with less than 15 minutes of CPR; initial rhythm  was asystole  . PVD (peripheral vascular disease)     Eval by Dr. Excell Seltzer 2012: noninvasive immaging suggested significant aortoiliac disease, ABIs in mild range  - med rx given significant comorbidities  . H/O endoscopy     For ?ampullary mass - had EGD 07/2010 with no evidence of ampullary mass, some CBD dilitation   . Chronic pain   . Lumbar stenosis     Admitted 09/2011 - not felt to be a good candidate for surgical intervention.  . CHF (congestive heart failure)     Past Surgical History  Procedure Date  . Thyroidectomy   . Laparoscopic cholecystectomy   . Lithotripsy      for nephrolithiasis  . Vaginal hysterectomy      for menorrhagia  at age 2  . Breast cyst excision   . Rotator cuff repair   . Cesarean section   . Laminectomy     Family History  Problem Relation Age of Onset  . Emphysema Father   . Allergies Sister   . Asthma Sister   . Heart disease Father   . Cancer Mother     History  Substance Use Topics  . Smoking status: Former Smoker -- 1.5 packs/day for 45 years  . Smokeless tobacco: Current User   Comment: PT USES VAPOR CIGARETTE  . Alcohol Use: No    OB History    Grav Para Term Preterm Abortions TAB SAB Ect Mult Living  Review of Systems  Constitutional: Positive for activity change, appetite change and fatigue. Negative for fever, chills, diaphoresis and unexpected weight change.  Respiratory: Positive for shortness of breath. Negative for cough.   Cardiovascular: Negative for chest pain.  Neurological: Positive for speech change and weakness.  Psychiatric/Behavioral: Positive for altered mental status.  All other systems reviewed and are negative.    Allergies  Latex and Penicillins  Home Medications   Current Outpatient Rx  Name Route Sig Dispense Refill  . ACETAMINOPHEN 325 MG PO TABS Oral Take 650 mg by mouth every 6 (six) hours as needed. For pain    . FAMOTIDINE 20 MG PO TABS Oral Take 1 tablet (20 mg total) by  mouth daily. For indigestion 30 tablet 1  . FLUOXETINE HCL 20 MG PO CAPS Oral Take 20 mg by mouth daily.      . FUROSEMIDE 80 MG PO TABS Oral Take 1 tablet (80 mg total) by mouth as directed. Take 1 tablet ( 80 mg) in the am take one half tablet (40 mg ) in the pm if weight is up by 2 lbs take two tablets (80 mg) twice a day 180 tablet 1  . LEVOTHYROXINE SODIUM 137 MCG PO TABS Oral Take 137 mcg by mouth daily.    Marland Kitchen METOLAZONE 2.5 MG PO TABS Oral Take 1 tablet (2.5 mg total) by mouth as directed. Take one tablet ( 2.5 mg) on Monday, Wednesday and Friday with am Lasix dose 30 tablet 9  . METOPROLOL SUCCINATE ER 25 MG PO TB24 Oral Take 25 mg by mouth daily.    . MORPHINE SULFATE ER 60 MG PO TBCR Oral Take 120 mg by mouth 2 (two) times daily.    . MORPHINE SULFATE 15 MG PO TABS Oral Take 15 mg by mouth every 4 (four) hours as needed. pain    . POTASSIUM CHLORIDE CRYS ER 20 MEQ PO TBCR  Take 40 meq twice a day on Tuesday, Thursday, Saturday, and Sunday and 60 meq twice a day Monday, Wednesday, and Friday take  (days she take Metolazone) 60 tablet 9  . SENNOSIDES-DOCUSATE SODIUM 8.6-50 MG PO TABS Oral Take 4 tablets by mouth 2 (two) times daily. For constipation.    . SPIRONOLACTONE 50 MG PO TABS Oral Take 1 tablet (50 mg total) by mouth daily. 30 tablet 11    BP 122/50  Pulse 87  Temp 98.9 F (37.2 C) (Oral)  Resp 13  SpO2 94%  Physical Exam  Nursing note and vitals reviewed. Constitutional: She is oriented to person, place, and time.       66  year old female, appears older than stated age.  HENT:  Head: Normocephalic and atraumatic.       MM's appear very dry  Eyes: EOM are normal. Pupils are equal, round, and reactive to light.  Neck: Normal range of motion. Neck supple.  Cardiovascular: Normal rate and regular rhythm.   No murmur heard. Pulmonary/Chest: Effort normal and breath sounds normal. No respiratory distress. She has no wheezes.  Abdominal: Soft. Bowel sounds are normal. She  exhibits no distension.  Musculoskeletal:       BLE edema present, venous stasis present.  Neurological: She is alert and oriented to person, place, and time.       She is alert and oriented, however her speech is slurred and she is having difficulty staying awake.  Skin: Skin is warm and dry.    ED Course  Procedures (including critical care time)  Labs Reviewed  GLUCOSE, CAPILLARY - Abnormal; Notable for the following:    Glucose-Capillary 261 (*)     All other components within normal limits  POCT I-STAT TROPONIN I - Abnormal; Notable for the following:    Troponin i, poc 0.15 (*)     All other components within normal limits  PRO B NATRIURETIC PEPTIDE  BASIC METABOLIC PANEL  CBC   No results found.   No diagnosis found.   Date: 12/11/2011  Rate: 88  Rhythm: normal sinus rhythm  QRS Axis: left  Intervals: normal  ST/T Wave abnormalities: nonspecific T wave changes  Conduction Disutrbances:none  Narrative Interpretation:   Old EKG Reviewed: unchanged    MDM  The patient presents here with fatigue, weakness over the past several days.  She appears clinically dehydrated.  Her potassium returned at 2.3 and was replaced with iv potassium.  As the troponin was elevated, I spoke with Dr. Elease Hashimoto.  His opinion was that in light of the clinical picture, the troponin was likely not significant and believes admission to medicine is appropriate.  I spoke with Dr. Sherre Lain from medicine for admission.        Geoffery Lyons, MD 12/11/11 [phone]

## 2011-12-11 NOTE — ED Notes (Signed)
Pt is being followed by cardiology for CHF flare up, they have been changing medication does over past week. Family states over past days she has been more SOB, lethargic, confused at times. Now pt is alert and oriented to most details. Reports chronic pain but no new pain. Always on 2l O2 [place]

## 2011-12-11 NOTE — ED Notes (Signed)
Admit Doctor at bedside stated transport patient to floor and have floor nurse call Admit Doctor with ammonia and ABG results. Floor notified.

## 2011-12-11 NOTE — ED Notes (Signed)
Pt placed on monitor. AIDET performed.

## 2011-12-11 NOTE — H&P (Signed)
[facility] History and Physical  [name]:[id number] DOB: 07/24/1945 DOA: 12/11/2011  Referring physician: Dr. Geoffery Lyons PCP: [name], MD  Specialists: Dr. Charlton Haws, cardiology  Chief Complaint: Altered mental status and generalized weakness  HPI: [name] is a 66 y.o. female with extensive past medical history as below, was brought to the emergency department secondary to one week history of progressive altered mental status and generalized weakness. Patient is unable to provide much history secondary to her altered mental status. History is obtained from the patient spouse and her daughter who are at the bedside. Patient has past medical history of open-dependent chronic respiratory failure, COPD, hypothyroidism, hypokalemia, thrombocytopenia, chronic lower extremity edema, moderate to severe aortic stenosis was not an operative candidate, chronic diastolic congestive heart failure, chronic pain on opioids. Patient's diuretics are being adjusted as an outpatient. Since one week family has noted that patient has had intermittent confusion which is progressively gotten worse.?? Facial asymmetry but no asymmetrical limb weakness or dysarthria. She at times has complained of seeing double. Since being in the ED, her confusion and alertness seemed to have improved according to family. They deny fever or chills but apparently patient complains of feeling cold. She has complained of dysuria. No nausea, vomiting or diarrhea. Oral intake has decreased and so has oral fluid intake. Urinary output has decreased in the last 24 hours. She has gotten progressively weak when she cannot take a couple of steps without risk of falling. No chest pain or worsening dyspnea. Cough with minimal white sputum. Evaluation in the ED shows patient to be clinically dry, CT head negative for acute findings, severe hypokalemia, troponin of 0.15. Cardiology has consulted and did not think that  patient has any acute cardiac events. The hospitalist service is requested to admit for further evaluation and management.   Review of Systems: All systems reviewed and apart from history of presenting illness, are negative. Patient is constipated. She has chronic abdominal and back pain.  Past Medical History  Diagnosis Date  . Carotid bruit     0-39% bilateral ICA by dopplers 03/2010  . Aortic stenosis     a. Moderate echo 01/2009, then severe 03/2010 --> last echo 09/2011:  EF 60-65%, grade 2 diastolic dysfunction, severe aortic stenosis, mean gradient 50, mild MR    b. Not an operative candidate due to comorbidities, not a candidate for TAVR given PVD. c. low risk myovue 11/2007 brest attenuation EF  63%  . Venous insufficiency   . Arthritis   . Depression   . COPD (chronic obstructive pulmonary disease)   . Hypothyroidism   . Thrombocytopenia   . Hypokalemia   . Edema   . Respiratory failure     Ventilator-dependent respiratory failure secondary to hypercarbia, cardiac arrest, presumed penumonia 12/2008  . Cardiac arrest     12/2010 in setting of respiratory failure with less than 15 minutes of CPR; initial rhythm was asystole  . PVD (peripheral vascular disease)     Eval by Dr. Excell Seltzer 2012: noninvasive immaging suggested significant aortoiliac disease, ABIs in mild range  - med rx given significant comorbidities  . H/O endoscopy     For ?ampullary mass - had EGD 07/2010 with no evidence of ampullary mass, some CBD dilitation   . Chronic pain   . Lumbar stenosis     Admitted 09/2011 - not felt to be a good candidate for surgical intervention.  . CHF (congestive heart failure)    Past Surgical  History  Procedure Date  . Thyroidectomy   . Laparoscopic cholecystectomy   . Lithotripsy      for nephrolithiasis  . Vaginal hysterectomy      for menorrhagia  at age 45  . Breast cyst excision   . Rotator cuff repair   . Cesarean section   . Laminectomy    Social History:  reports  that she has quit smoking. She uses smokeless tobacco. She reports that she does not drink alcohol or use illicit drugs. Patient lives with her spouse. She is dependent on activities of daily living.  Allergies  Allergen Reactions  . Latex     REACTION: itch  . Penicillins     REACTION: hives    Family History  Problem Relation Age of Onset  . Emphysema Father   . Allergies Sister   . Asthma Sister   . Heart disease Father   . Cancer Mother     Prior to Admission medications   Medication Sig Start Date End Date Taking? Authorizing Provider  acetaminophen (TYLENOL) 325 MG tablet Take 650 mg by mouth every 6 (six) hours as needed. For pain   Yes Historical Provider, MD  dextromethorphan-guaiFENesin (MUCINEX DM) 30-600 MG per 12 hr tablet Take 1 tablet by mouth every 12 (twelve) hours.   Yes Historical Provider, MD  famotidine (PEPCID) 20 MG tablet Take 1 tablet (20 mg total) by mouth daily. For indigestion 11/07/11 11/06/12 Yes Evlyn Kanner Love, PA  FLUoxetine (PROZAC) 20 MG capsule Take 20 mg by mouth daily.     Yes Historical Provider, MD  furosemide (LASIX) 80 MG tablet Take 1 tablet (80 mg total) by mouth as directed. Take 1 tablet ( 80 mg) in the am take one half tablet (40 mg ) in the pm if weight is up by 2 lbs take two tablets (80 mg) twice a day 12/01/11 11/30/12 Yes Scott Moishe Spice, PA  levothyroxine (SYNTHROID, LEVOTHROID) 137 MCG tablet Take 137 mcg by mouth daily.   Yes Historical Provider, MD  metoprolol succinate (TOPROL-XL) 25 MG 24 hr tablet Take 25 mg by mouth daily.   Yes Historical Provider, MD  morphine (MS CONTIN) 60 MG 12 hr tablet Take 120 mg by mouth 2 (two) times daily.   Yes Historical Provider, MD  morphine (MSIR) 15 MG tablet Take 15 mg by mouth every 4 (four) hours as needed. pain   Yes Historical Provider, MD  potassium chloride (K-DUR,KLOR-CON) 10 MEQ tablet Take 10 mEq by mouth 2 (two) times daily.   Yes Historical Provider, MD  senna-docusate (SENOKOT-S) 8.6-50  MG per tablet Take 4 tablets by mouth 2 (two) times daily. For constipation. 11/07/11 11/06/12 Yes Evlyn Kanner Love, PA  spironolactone (ALDACTONE) 50 MG tablet Take 1 tablet (50 mg total) by mouth daily. 12/10/11  Yes Beatrice Lecher, PA   Physical Exam: Filed Vitals:   12/11/11 1530 12/11/11 1600 12/11/11 1630 12/11/11 1912  BP: 132/71 116/54 125/70 112/43  Pulse: 85 88 80   Temp:    98.4 F (36.9 C)  TempSrc:    Axillary  Resp: 20 17 18 18   SpO2: 93% 96% 95% 94%     General exam: Moderately built and overweight female patient lying supine on the gurney and in no obvious distress.  Head, eyes and ENT: Nontraumatic and normocephalic. Pupils bilateral 2 mm equally reacting to light and accommodation. Oral mucosa is dry.   Neck: Supple. No JVD, carotid bruit or thyromegaly.  Lymphatics: No lymphadenopathy.  Respiratory system: Poor inspiratory effort. Slightly decreased breath sounds in the bases but no wheezing, rhonchi or crackles. No increased work of breathing.  Cardiovascular system: S1 and S2 heard, regular rate and rhythm. No JVD. Systolic ejection murmur grade 3 x 6 at apex.  Gastrointestinal system: Abdomen is obese, soft and nontender. No organomegaly or masses appreciated. Normal bowel sounds heard.  Central nervous system: Patient is somnolent but arousable. She is oriented to self and to place. She easily drifts back to sleep. No focal cranial nerve deficits. Asterixis positive.  Extremities: Bilateral lower extremity chronic mild edema below midshin but otherwise no acute findings. Symmetric 5 x 5 limb movements. Peripheral pulses symmetrically felt.  Skin: No acute findings.  Musculoskeletal system: No acute findings.  Psychiatry: Somnolent and unable to assess.  Labs on Admission:  Basic Metabolic Panel:  Lab 12/11/11 1610  NA 140  K 2.3*  CL 87*  CO2 42*  GLUCOSE 206*  BUN 36*  CREATININE 1.23*  CALCIUM 9.4  MG --  PHOS --   Liver Function Tests: No  results found for this basename: AST:5,ALT:5,ALKPHOS:5,BILITOT:5,PROT:5,ALBUMIN:5 in the last 168 hours No results found for this basename: LIPASE:5,AMYLASE:5 in the last 168 hours No results found for this basename: AMMONIA:5 in the last 168 hours CBC:  Lab 12/11/11 1600  WBC 10.3  NEUTROABS --  HGB 16.1*  HCT 48.5*  MCV 87.5  PLT 125*   Cardiac Enzymes: No results found for this basename: CKTOTAL:5,CKMB:5,CKMBINDEX:5,TROPONINI:5 in the last 168 hours  BNP (last 3 results)  Basename 12/11/11 1425 11/21/11 1733  PROBNP 300.4* 128.2*   CBG:  Lab 12/11/11 1345  GLUCAP 261*    Radiological Exams on Admission: Ct Head Wo Contrast  12/11/2011  *RADIOLOGY REPORT*  Clinical Data: Mental status change.  Evaluate for potential cerebrovascular accident.  CT HEAD WITHOUT CONTRAST  Technique:  Contiguous axial images were obtained from the base of the skull through the vertex without contrast.  Comparison: Head CT 12/13/2009.  Findings: No acute intracranial abnormalities.  Specifically, no evidence of large vascular territory acute/subacute cerebral ischemia, no evidence of acute intracerebral hemorrhage, no focal mass, mass effect, hydrocephalus or abnormal intra or extra-axial fluid collections.  Physiologic calcifications of the basal ganglia bilaterally (left greater than right) are unchanged.  No acute displaced skull fractures are identified.  Visualized paranasal sinuses and mastoids are well pneumatized.  IMPRESSION: 1.  No acute intracranial abnormalities. 2.  The appearance of the brain is normal.   Original Report Authenticated By: Florencia Reasons, M.D.    Dg Chest Port 1 View  12/11/2011  *RADIOLOGY REPORT*  Clinical Data: Chest pain and shortness of breath.  PORTABLE CHEST - 1 VIEW  Comparison: Chest x-ray 11/21/2011.  Findings: Lung volumes are normal.  There is an ill-defined opacity in the periphery of the left base which may be projectional (the patient is significantly  rotated to the left), however, the region of airspace consolidation in the lingula is not excluded.  Lungs otherwise appear clear.  There is mild cephalization of the pulmonary vasculature, without frank pulmonary edema.  Mild cardiomegaly is unchanged.  No definite pleural effusions. The patient is rotated to the left on today's exam, resulting in distortion of the mediastinal contours and reduced diagnostic sensitivity and specificity for mediastinal pathology.  IMPRESSION: 1.  Study is limited by leftward rotation of the patient.  There may be some lingular air space consolidation which could be concerning for infection.  However, this may alternatively be  projectional.  Repeat properly centered a standing PA and lateral chest radiograph is recommended. 2.  Mild cardiomegaly with mild pulmonary venous congestion, but no frank pulmonary edema.   Original Report Authenticated By: Florencia Reasons, M.D.     EKG: Independently reviewed.  Normal sinus rhythm. No acute changes. QTC 542 ms.  Assessment/Plan Principal Problem:  *Encephalopathy acute Active Problems:  HYPOTHYROIDISM  THROMBOCYTOPENIA  AORTIC STENOSIS  VENOUS INSUFFICIENCY  COPD  Chronic diastolic CHF (congestive heart failure)  Dehydration  UTI (lower urinary tract infection)  Hypokalemia  Respiratory failure, chronic   1. Acute encephalopathy: Unclear etiology. CT head negative. Possibilities include dehydration, mild acute renal failure, rule out CO2 narcosis, urinary tract infection and underlying opioids. Admit to telemetry. Follow ABG, ammonia levels. Treat urinary tract infection. Temporarily hold opioids. Monitor. N.p.o. until patient alert-patient apparently had difficulty swallowing in the ED. Consider speech therapy consult 2. Dehydration: Secondary to diuretics and poor oral intake. Patient has had significant nausea and occasional vomiting but no diarrhea. Brief IV fluids and monitor. 3. Severe hypokalemia: Secondary  to diuretics and poor oral intake: IV replete and follow BMP. Check magnesium. 4. Possible UTI: IV Rocephin pending culture results. 5. Prolonged QTC: Repeat EKG in a.m. after repleting potassium and magnesium. 6. Chronic lower extremity edema: Seem to have significantly improved after outpatient diuretic treatment. 7. Hypothyroidism: Check TSH. 8. Hypoglycemia: Check A1c and place on sliding scale insulin. 9. Aortic stenosis: Not a surgical candidate. 10. Elevated troponin: Likely demand ischemia. Cycle cardiac enzymes. Use rectal aspirin. Consider 2-D echo. 11. Thrombocytopenia: Follow CBC in a.m. 12. Chronic respiratory failure: Continue O2. Bronchodilator meds. Check ABG. 13. Chronic diastolic congestive heart failure: Currently clinically dehydrated.  Code Status: DO NOT RESUSCITATE Family Communication: Discussed with patient spouse and daughter at bedside in detail, updated care and answered questions. Disposition Plan: Home.  Time spent: 65 minutes  [facility] Pager [phone]  If 7PM-7AM, please contact night-coverage [email or web address] Password [email or web address] 12/11/2011, 7:55 PM

## 2011-12-11 NOTE — Consult Note (Signed)
CARDIOLOGY CONSULT NOTE    Patient ID: [name] MRN: [id number] DOB/AGE: 65-16-1947 65 y.o.  Admit date: 12/11/2011 Referring Physician:  Judd Lien Primary Physician: [name], MD Primary Cardiologist:  Eden Emms Reason for Consultation: ? CHF AS  Active Problems:  * No active hospital problems. *    HPI:   Chronically ill female with poor fucntional capacity.  Multiple recent hospitalizations.  Just D/C from rehab.  Requires high doses of diuretic for LE edema that is venous insuf. And not related to heart She has severe AS and is not an operative candidate.  Her K has been low when on zaroxyln and lasix and she was just changed to aldactone to try to retain K.  Has had increasing lethargy fatigue and change in  Mental status last few days.  She has no chest pain, syncope or palpitations.  LE edema is actually improved for her.  No focal neuro signs but speech slurred.  No recent trauma Discussed with husband and he confirms DNR status.  No fever or seizure activity  @ROS @ All other systems reviewed and negative except as noted above  Past Medical History  Diagnosis Date  . Carotid bruit     0-39% bilateral ICA by dopplers 03/2010  . Aortic stenosis     a. Moderate echo 01/2009, then severe 03/2010 --> last echo 09/2011:  EF 60-65%, grade 2 diastolic dysfunction, severe aortic stenosis, mean gradient 50, mild MR    b. Not an operative candidate due to comorbidities, not a candidate for TAVR given PVD. c. low risk myovue 11/2007 brest attenuation EF  63%  . Venous insufficiency   . Arthritis   . Depression   . COPD (chronic obstructive pulmonary disease)   . Hypothyroidism   . Thrombocytopenia   . Hypokalemia   . Edema   . Respiratory failure     Ventilator-dependent respiratory failure secondary to hypercarbia, cardiac arrest, presumed penumonia 12/2008  . Cardiac arrest     12/2010 in setting of respiratory failure with less than 15 minutes of CPR; initial rhythm was  asystole  . PVD (peripheral vascular disease)     Eval by Dr. Excell Seltzer 2012: noninvasive immaging suggested significant aortoiliac disease, ABIs in mild range  - med rx given significant comorbidities  . H/O endoscopy     For ?ampullary mass - had EGD 07/2010 with no evidence of ampullary mass, some CBD dilitation   . Chronic pain   . Lumbar stenosis     Admitted 09/2011 - not felt to be a good candidate for surgical intervention.  . CHF (congestive heart failure)     Family History  Problem Relation Age of Onset  . Emphysema Father   . Allergies Sister   . Asthma Sister   . Heart disease Father   . Cancer Mother     History   Social History  . Marital Status: Married    Spouse Name: N/A    Number of Children: N/A  . Years of Education: N/A   Occupational History  . Not on file.   Social History Main Topics  . Smoking status: Former Smoker -- 1.5 packs/day for 45 years  . Smokeless tobacco: Current User   Comment: PT USES [employer]  . Alcohol Use: No  . Drug Use: Not on file  . Sexually Active: Not on file   Other Topics Concern  . Not on file   Social History Narrative   pt is married.pt has children.pt is a  housewife.     Past Surgical History  Procedure Date  . Thyroidectomy   . Laparoscopic cholecystectomy   . Lithotripsy      for nephrolithiasis  . Vaginal hysterectomy      for menorrhagia  at age 47  . Breast cyst excision   . Rotator cuff repair   . Cesarean section   . Laminectomy         . potassium chloride SA  40 mEq Oral Once      . potassium chloride Stopped (12/11/11 1745)  . sodium chloride Stopped (12/11/11 1746)    Physical Exam: Blood pressure 125/70, pulse 80, temperature 98.9 F (37.2 C), temperature source Oral, resp. rate 18, SpO2 95.00%.   Affect appropriate Chronically ill white female HEENT: normal Neck supple with no adenopathy JVP normal no bruits no thyromegaly Lungs clear with no wheezing and good diaphragmatic  motion Heart:  S1/S2 AS  murmur, no rub, gallop or click PMI normal Abdomen: benighn, BS positve, no tenderness, no AAA no bruit.  No HSM or HJR Distal pulses diminished with PVD  Plus two bilateral edema improved with no cellulitis Neuro non-focal but lethargic and poorly responsive Skin warm and dry No muscular weakness   Labs:   Lab Results  Component Value Date   WBC 10.3 12/11/2011   HGB 16.1* 12/11/2011   HCT 48.5* 12/11/2011   MCV 87.5 12/11/2011   PLT 125* 12/11/2011    Lab 12/11/11 1426  NA 140  K 2.3*  CL 87*  CO2 42*  BUN 36*  CREATININE 1.23*  CALCIUM 9.4  PROT --  BILITOT --  ALKPHOS --  ALT --  AST --  GLUCOSE 206*   Lab Results  Component Value Date   CKTOTAL 153 01/15/2009   CKMB 1.9 01/15/2009   TROPONINI  Value: 0.23        PERSISTENTLY INCREASED TROPONIN VALUES IN THE RANGE OF 0.06-0.49 ng/mL CAN BE SEEN IN:       -UNSTABLE ANGINA       -CONGESTIVE HEART FAILURE       -MYOCARDITIS       -CHEST TRAUMA       -ARRYHTHMIAS       -LATE PRESENTING MI       -COPD   CLINICAL FOLLOW-UP RECOMMENDED.* 01/15/2009       Radiology: Dg Chest 2 View  11/21/2011  *RADIOLOGY REPORT*  Clinical Data: Shortness of breath.  Lower extremity swelling. Cough.  CHEST - 2 VIEW  Comparison: 11/05/2011.  Findings: Trachea is midline.  Surgical clips are seen in the high right paratracheal region.  Heart is enlarged, stable.  Lungs are somewhat low in volume with mild diffuse interstitial prominence. Probable bibasilar atelectasis or scarring.  No definite pleural fluid.  Minimal anterior wedging of lower thoracic vertebral bodies, as before.  IMPRESSION: Mild edema versus chronic interstitial prominence.   Original Report Authenticated By: Reyes Ivan, M.D.    Dg Chest Port 1 View  12/11/2011  *RADIOLOGY REPORT*  Clinical Data: Chest pain and shortness of breath.  PORTABLE CHEST - 1 VIEW  Comparison: Chest x-ray 11/21/2011.  Findings: Lung volumes are normal.  There is  an ill-defined opacity in the periphery of the left base which may be projectional (the patient is significantly rotated to the left), however, the region of airspace consolidation in the lingula is not excluded.  Lungs otherwise appear clear.  There is mild cephalization of the pulmonary vasculature, without frank pulmonary edema.  Mild cardiomegaly is unchanged.  No definite pleural effusions. The patient is rotated to the left on today's exam, resulting in distortion of the mediastinal contours and reduced diagnostic sensitivity and specificity for mediastinal pathology.  IMPRESSION: 1.  Study is limited by leftward rotation of the patient.  There may be some lingular air space consolidation which could be concerning for infection.  However, this may alternatively be projectional.  Repeat properly centered a standing PA and lateral chest radiograph is recommended. 2.  Mild cardiomegaly with mild pulmonary venous congestion, but no frank pulmonary edema.   Original Report Authenticated By: Florencia Reasons, M.D.     EKG:  NSR LAD normal otherwise with no acute changes   ASSESSMENT AND PLAN:   AS:  Not an operative candidate and currently not even a candidate to be considered for TAVR Edema:  Chronic venous stasis.  Use lower dose lasix and aldactone for diuresis  Zaroxyln D/C Supple K MS:  Change in MS  History of hypercarbic respitory failure  Check ABG as she likely has a contraction alkalosis and elevated CO2 Should not be intubated.  Check LFT;s, ammonia as well.  CT scan R/O CVA or bleed    No acute cardiac issue and no signs of acute cardiac event  Signed: Charlton Haws 12/11/2011, 6:15 PM

## 2011-12-11 NOTE — ED Notes (Signed)
Results of troponin I given to Dr. Karma Ganja

## 2011-12-11 NOTE — ED Notes (Signed)
Spoke with Lab stated have not received CBC. Spoke with phlebotomy and will deliver CBC to lab by walking to lab.

## 2011-12-12 ENCOUNTER — Encounter (HOSPITAL_COMMUNITY): Payer: Self-pay | Admitting: *Deleted

## 2011-12-12 ENCOUNTER — Inpatient Hospital Stay (HOSPITAL_COMMUNITY): Payer: Medicare Other

## 2011-12-12 LAB — BASIC METABOLIC PANEL
BUN: 36 mg/dL — ABNORMAL HIGH (ref 6–23)
Chloride: 93 mEq/L — ABNORMAL LOW (ref 96–112)
Creatinine, Ser: 1.17 mg/dL — ABNORMAL HIGH (ref 0.50–1.10)
Glucose, Bld: 137 mg/dL — ABNORMAL HIGH (ref 70–99)
Potassium: 3.1 mEq/L — ABNORMAL LOW (ref 3.5–5.1)

## 2011-12-12 LAB — TSH: TSH: 0.153 u[IU]/mL — ABNORMAL LOW (ref 0.350–4.500)

## 2011-12-12 LAB — GLUCOSE, CAPILLARY: Glucose-Capillary: 134 mg/dL — ABNORMAL HIGH (ref 70–99)

## 2011-12-12 LAB — TROPONIN I: Troponin I: <0.3 ng/mL (ref <0.30)

## 2011-12-12 LAB — HEMOGLOBIN A1C: Hgb A1c MFr Bld: 6.8 % — ABNORMAL HIGH (ref <5.7)

## 2011-12-12 MED ORDER — MORPHINE SULFATE ER 15 MG PO TBCR
120.0000 mg | EXTENDED_RELEASE_TABLET | Freq: Two times a day (BID) | ORAL | Status: DC
  Administered 2011-12-12 – 2011-12-13 (×3): 120 mg via ORAL
  Administered 2011-12-14: 15 mg via ORAL
  Filled 2011-12-12 (×4): qty 8

## 2011-12-12 MED ORDER — ASPIRIN 325 MG PO TABS
325.0000 mg | ORAL_TABLET | Freq: Every day | ORAL | Status: DC
  Administered 2011-12-12 – 2011-12-14 (×3): 325 mg via ORAL
  Filled 2011-12-12 (×3): qty 1

## 2011-12-12 MED ORDER — FAMOTIDINE 20 MG PO TABS
20.0000 mg | ORAL_TABLET | Freq: Every day | ORAL | Status: DC
  Administered 2011-12-12 – 2011-12-14 (×3): 20 mg via ORAL
  Filled 2011-12-12 (×3): qty 1

## 2011-12-12 MED ORDER — LACTULOSE 10 GM/15ML PO SOLN
10.0000 g | Freq: Two times a day (BID) | ORAL | Status: DC
  Administered 2011-12-12 – 2011-12-13 (×4): 10 g via ORAL
  Filled 2011-12-12 (×6): qty 15

## 2011-12-12 MED ORDER — FLUOXETINE HCL 20 MG PO CAPS
20.0000 mg | ORAL_CAPSULE | Freq: Every day | ORAL | Status: DC
  Administered 2011-12-12 – 2011-12-14 (×3): 20 mg via ORAL
  Filled 2011-12-12 (×3): qty 1

## 2011-12-12 MED ORDER — MORPHINE SULFATE 15 MG PO TABS
15.0000 mg | ORAL_TABLET | ORAL | Status: DC | PRN
  Administered 2011-12-12: 15 mg via ORAL
  Filled 2011-12-12: qty 1

## 2011-12-12 MED ORDER — SPIRONOLACTONE 25 MG PO TABS
25.0000 mg | ORAL_TABLET | Freq: Every day | ORAL | Status: DC
  Administered 2011-12-12 – 2011-12-14 (×3): 25 mg via ORAL
  Filled 2011-12-12 (×4): qty 1

## 2011-12-12 MED ORDER — SENNOSIDES-DOCUSATE SODIUM 8.6-50 MG PO TABS
4.0000 | ORAL_TABLET | Freq: Two times a day (BID) | ORAL | Status: DC
  Administered 2011-12-12 – 2011-12-13 (×3): 4 via ORAL
  Filled 2011-12-12 (×5): qty 4

## 2011-12-12 MED ORDER — LEVOTHYROXINE SODIUM 137 MCG PO TABS
137.0000 ug | ORAL_TABLET | Freq: Every day | ORAL | Status: DC
  Administered 2011-12-12 – 2011-12-14 (×3): 137 ug via ORAL
  Filled 2011-12-12 (×4): qty 1

## 2011-12-12 MED ORDER — METOPROLOL SUCCINATE ER 25 MG PO TB24
25.0000 mg | ORAL_TABLET | Freq: Every day | ORAL | Status: DC
  Administered 2011-12-12 – 2011-12-14 (×3): 25 mg via ORAL
  Filled 2011-12-12 (×3): qty 1

## 2011-12-12 NOTE — Progress Notes (Signed)
CRITICAL VALUE ALERT  Critical value received:  CO2= 40  Date of notification:  12/12/11  Time of notification:  0430  Critical value read back:yes  Nurse who received alert:  R. Baldomero Mirarchi rn  MD notified (1st page):  Lance Coon  Time of first page:  0430  MD notified (2nd page):  Time of second page:  Responding MD:    Time MD responded:  0500

## 2011-12-12 NOTE — Progress Notes (Signed)
12/12/2011 12:33 PM  Fall Risk Tool done on patient at 44 by this RN.  Patient is a moderate fall risk, with a score of 12.  Fall risk protocol and fall risk score was shared with patient and husband, to which they both verbalized understanding and the patient assured me that she would not get OOB without calling for assistance.  The patient did refuse the bed alarm at this time however, despite education.  She stated that while her husband was here she would be fine.  I informed her that we would reassess the situation once the husband left, as the protocol calls for a bed alarm regardless, but especially if she were to be alone.  She verbalized understanding.  Yellow arm band and red socks applied to patient.  Will continue to monitor patient.  Eunice Blase

## 2011-12-12 NOTE — Progress Notes (Signed)
[facility] PROGRESS NOTE  [name] WUJ:[id number] DOB: 02/03/1946 DOA: 12/11/2011 PCP: [name], MD  Assessment/Plan: 1. Acute encephalopathy: Unclear etiology. Much improved this am.  CT head negative. Possibilities include dehydration, mild acute renal failure, and underlying opioids. continue gentle hydration. Continue rocephin day #2. Temporarily hold opioids. Speech therapy rec regular diet. Will provide lactulose. Will check BMET in am.  2. Dehydration: Secondary to diuretics and poor oral intake. Will recheck in am.  3. Severe hypokalemia: Secondary to diuretics and poor oral intake: IV replete and follow BMP. Magnesium 2.3 4. Possible UTI: IV Rocephin pending culture results. 5. Prolonged QTC: Repeat EKG in a.m. after repleting potassium and magnesium. 6. Chronic lower extremity edema: Seem to have significantly improved after outpatient diuretic treatment. 7. Hypothyroidism: Check TSH. 8. Hypoglycemia: Check A1c and place on sliding scale insulin. 9. Aortic stenosis: Not a surgical candidate. 10. Elevated troponin: Likely demand ischemia. Cycle cardiac enzymes. Use rectal aspirin. Consider 2-D echo. 11. Thrombocytopenia: Follow CBC in a.m. 12. Chronic respiratory failure: Continue O2. Bronchodilator meds. Check ABG. 13. Chronic diastolic congestive heart failure: Currently clinically dehydrated.    Code Status: DNR Family Communication: pt and husband at bedside Disposition Plan: home when ready   Consultants:  cards  Procedures:  none  Antibiotics:  Rocephin 12/11/11>>>  HPI/Subjective: Sitting on side of bed talking with husband.  Denies pain/discomfort NAD  Objective: Filed Vitals:   12/11/11 1912 12/11/11 2020 12/12/11 0519 12/12/11 1248  BP: 112/43 139/57 123/58 115/70  Pulse:  64 87 87  Temp: 98.4 F (36.9 C) 99 F (37.2 C) 98.2 F (36.8 C) 98.2 F (36.8 C)  TempSrc: Axillary Oral Oral Oral  Resp: 18 20 20 20   Height:  5'  (1.524 m)    Weight:  103.783 kg (228 lb 12.8 oz) 102.876 kg (226 lb 12.8 oz)   SpO2: 94% 95% 97% 97%    Intake/Output Summary (Last 24 hours) at 12/12/11 1502 Last data filed at 12/12/11 1247  Gross per 24 hour  Intake 1338.33 ml  Output    802 ml  Net 536.33 ml   Filed Weights   12/11/11 2020 12/12/11 0519  Weight: 103.783 kg (228 lb 12.8 oz) 102.876 kg (226 lb 12.8 oz)    Exam:   General:  Awake alert oriented x3  Cardiovascular: RRR +murmur trace LEE  Respiratory: normal effort BSCTAB   Abdomen: obese soft +BS non-tender  Data Reviewed: Basic Metabolic Panel:  Lab 12/12/11 9562 12/11/11 2122 12/11/11 1426  NA 141 -- 140  K 3.1* -- 2.3*  CL 93* -- 87*  CO2 40* -- 42*  GLUCOSE 137* -- 206*  BUN 36* -- 36*  CREATININE 1.17* -- 1.23*  CALCIUM 8.9 -- 9.4  MG -- 2.3 --  PHOS -- -- --   Liver Function Tests:  Lab 12/11/11 1912  AST 46*  ALT 22  ALKPHOS 200*  BILITOT 2.1*  PROT 7.4  ALBUMIN 3.4*   No results found for this basename: LIPASE:5,AMYLASE:5 in the last 168 hours  Lab 12/11/11 1911  AMMONIA 164*   CBC:  Lab 12/11/11 1600  WBC 10.3  NEUTROABS --  HGB 16.1*  HCT 48.5*  MCV 87.5  PLT 125*   Cardiac Enzymes:  Lab 12/12/11 0817 12/12/11 0212 12/11/11 2123  CKTOTAL -- -- --  CKMB -- -- --  CKMBINDEX -- -- --  TROPONINI <0.30 <0.30 <0.30   BNP (last 3 results)  Basename 12/11/11 1425 11/21/11 1733  PROBNP 300.4*  128.2*   CBG:  Lab 12/12/11 1158 12/12/11 0800 12/12/11 0522 12/11/11 2344 12/11/11 2114  GLUCAP 208* 134* 134* 135* 142*    No results found for this or any previous visit (from the past 240 hour(s)).   Studies: X-ray Chest Pa And Lateral   12/12/2011  *RADIOLOGY REPORT*  Clinical Data: Shortness of breath, hypertension.  Query pneumonia.  CHEST - 2 VIEW  Comparison: 12/11/2011  Findings: Ill-defined lingular density appears to been present on radiographs of the past several years, and as such as attributable to  prominent epicardial adipose tissue rather than pneumonia.  Mild cardiomegaly is present with borderline prominence the main pulmonary artery.  Linear opacity projecting over the lung bases on the lateral projection probably reflects mild atelectasis in the left lower lobe.  Thoracic spondylosis noted.  IMPRESSION:  1.  Mild atelectasis in the left lower lobe. 2.  Lingular density is most attributable to epicardial adipose tissue. 3.  Mild cardiomegaly, potentially with mild prominence the main pulmonary artery.  Pulmonary arterial hypertension not excluded.   Original Report Authenticated By: Dellia Cloud, M.D.    Ct Head Wo Contrast  12/11/2011  *RADIOLOGY REPORT*  Clinical Data: Mental status change.  Evaluate for potential cerebrovascular accident.  CT HEAD WITHOUT CONTRAST  Technique:  Contiguous axial images were obtained from the base of the skull through the vertex without contrast.  Comparison: Head CT 12/13/2009.  Findings: No acute intracranial abnormalities.  Specifically, no evidence of large vascular territory acute/subacute cerebral ischemia, no evidence of acute intracerebral hemorrhage, no focal mass, mass effect, hydrocephalus or abnormal intra or extra-axial fluid collections.  Physiologic calcifications of the basal ganglia bilaterally (left greater than right) are unchanged.  No acute displaced skull fractures are identified.  Visualized paranasal sinuses and mastoids are well pneumatized.  IMPRESSION: 1.  No acute intracranial abnormalities. 2.  The appearance of the brain is normal.   Original Report Authenticated By: Florencia Reasons, M.D.    Dg Chest Port 1 View  12/11/2011  *RADIOLOGY REPORT*  Clinical Data: Chest pain and shortness of breath.  PORTABLE CHEST - 1 VIEW  Comparison: Chest x-ray 11/21/2011.  Findings: Lung volumes are normal.  There is an ill-defined opacity in the periphery of the left base which may be projectional (the patient is significantly rotated to  the left), however, the region of airspace consolidation in the lingula is not excluded.  Lungs otherwise appear clear.  There is mild cephalization of the pulmonary vasculature, without frank pulmonary edema.  Mild cardiomegaly is unchanged.  No definite pleural effusions. The patient is rotated to the left on today's exam, resulting in distortion of the mediastinal contours and reduced diagnostic sensitivity and specificity for mediastinal pathology.  IMPRESSION: 1.  Study is limited by leftward rotation of the patient.  There may be some lingular air space consolidation which could be concerning for infection.  However, this may alternatively be projectional.  Repeat properly centered a standing PA and lateral chest radiograph is recommended. 2.  Mild cardiomegaly with mild pulmonary venous congestion, but no frank pulmonary edema.   Original Report Authenticated By: Florencia Reasons, M.D.     Scheduled Meds:   . aspirin  325 mg Oral Daily  . cefTRIAXone (ROCEPHIN)  IV  1 g Intravenous Q24H  . enoxaparin (LOVENOX) injection  40 mg Subcutaneous Q24H  . famotidine  20 mg Oral Daily  . FLUoxetine  20 mg Oral Daily  . insulin aspart  0-9 Units Subcutaneous  Q4H  . lactulose  10 g Oral BID  . lactulose  300 mL Rectal Once  . levothyroxine  137 mcg Oral QAC breakfast  . metoprolol succinate  25 mg Oral Daily  . morphine  120 mg Oral BID  . potassium chloride  10 mEq Intravenous Once  . potassium chloride  10 mEq Intravenous Once  . potassium chloride  10 mEq Intravenous Q1 Hr x 4  . senna-docusate  4 tablet Oral BID  . sodium chloride  500 mL Intravenous Once  . sodium chloride  3 mL Intravenous Q12H  . spironolactone  25 mg Oral Daily  . DISCONTD: aspirin  300 mg Rectal Daily  . DISCONTD: potassium chloride SA  40 mEq Oral Once   Continuous Infusions:   . 0.9 % NaCl with KCl 20 mEq / L 50 mL/hr at 12/11/11 2202    Principal Problem:  *Encephalopathy acute Active Problems:   HYPOTHYROIDISM  THROMBOCYTOPENIA  AORTIC STENOSIS  VENOUS INSUFFICIENCY  COPD  Chronic diastolic CHF (congestive heart failure)  Dehydration  UTI (lower urinary tract infection)  Hypokalemia  Respiratory failure, chronic    Time spent: 30 minutes    Gwenyth Bender NP [facility]  If 8PM-8AM, please contact night-coverage at [email or web address], password [facility] 12/12/2011, 3:02 PM  LOS: 1 day

## 2011-12-12 NOTE — Progress Notes (Signed)
Pt reported that she needed her sugar rechecked and felt like she had temp.  Temp 97.6 orally , and also reported that she is noted voided all day.  Nurse Shanda Bumps. RN, made aware.  Will notify pt's MD.  Amanda Pea, RN.

## 2011-12-12 NOTE — Progress Notes (Signed)
Utilization Review Completed.  

## 2011-12-12 NOTE — Progress Notes (Signed)
Was contacted by Dr. Waymon Amato in regards to this patient's pending labs. Her ABG looks at baseline but ammonia significantly elevated. I evaluated the patient at bedside and although lethargic was breathing normally and protecting her airway. She spoke with me and requested something to drink. She was easily awakened when spoken to. I ordered a dose of lactulose. She is stable to stay on the floor for now but will certainly not hesitate to move her to higher level of care if necessary later on. Will recheck ABG later tonight as well.

## 2011-12-12 NOTE — Progress Notes (Signed)
Patient seen and examined by me.  Hope to d/c tomm with good bowel regimen, lactulose.  Patient is on quite a bit of pain medication and would be best to wean if patient could tolerate.  Has known back issues but unable to have surgery secondary to heart issues.

## 2011-12-12 NOTE — Progress Notes (Signed)
Patient complained of not voiding today.  I did a bladder scan it showed 358 cc.  I notified Dr. Benjamine Mola she stated to re scan around 8 pm tonight if patient still hasn't voided to call MD.  I will pass this to night RN.

## 2011-12-12 NOTE — Evaluation (Signed)
Clinical/Bedside Swallow Evaluation Patient Details  Name: AMMARIE RUND MRN: [id number] Date of Birth: [birth year]  Today's Date: 12/12/2011 Time: 7829-5621 SLP Time Calculation (min): 25 min  Past Medical History:  Past Medical History  Diagnosis Date  . Carotid bruit     0-39% bilateral ICA by dopplers 03/2010  . Aortic stenosis     a. Moderate echo 01/2009, then severe 03/2010 --> last echo 09/2011:  EF 60-65%, grade 2 diastolic dysfunction, severe aortic stenosis, mean gradient 50, mild MR    b. Not an operative candidate due to comorbidities, not a candidate for TAVR given PVD. c. low risk myovue 11/2007 brest attenuation EF  63%  . Venous insufficiency   . Arthritis   . Depression   . COPD (chronic obstructive pulmonary disease)   . Hypothyroidism   . Thrombocytopenia   . Hypokalemia   . Edema   . Respiratory failure     Ventilator-dependent respiratory failure secondary to hypercarbia, cardiac arrest, presumed penumonia 12/2008  . Cardiac arrest     12/2010 in setting of respiratory failure with less than 15 minutes of CPR; initial rhythm was asystole  . PVD (peripheral vascular disease)     Eval by Dr. Excell Seltzer 2012: noninvasive immaging suggested significant aortoiliac disease, ABIs in mild range  - med rx given significant comorbidities  . H/O endoscopy     For ?ampullary mass - had EGD 07/2010 with no evidence of ampullary mass, some CBD dilitation   . Chronic pain   . Lumbar stenosis     Admitted 09/2011 - not felt to be a good candidate for surgical intervention.  . CHF (congestive heart failure)    Past Surgical History:  Past Surgical History  Procedure Date  . Thyroidectomy   . Laparoscopic cholecystectomy   . Lithotripsy      for nephrolithiasis  . Vaginal hysterectomy      for menorrhagia  at age 89  . Breast cyst excision   . Rotator cuff repair   . Cesarean section   . Laminectomy    HPI:   66 y.o. female with extensive past medical history as below,  was brought to the emergency department secondary to one week history of progressive altered mental status and generalized weakness. Patient is unable to provide much history secondary to her altered mental status. History is obtained from the patient spouse and her daughter who are at the bedside. Patient has past medical history of open-dependent chronic respiratory failure, COPD, hypothyroidism, hypokalemia, thrombocytopenia, chronic lower extremity edema, moderate to severe aortic stenosis was not an operative candidate, chronic diastolic congestive heart failure, chronic pain on opioids. Diagnosis includes acute encephalopathy: Unclear etiology. CT head negative. Possibilities include dehydration, mild acute renal failure, rule out CO2 narcosis, urinary tract infection and underlying opioids.. Pt had idfficutly swallowing in ED.    Assessment / Plan / Recommendation Clinical Impression  Pt presents with xerostomia (pt confirms that this is an ongoing problem PTA, she suspects meds cause this). SLP provided oral care to clean and moisten mouth prior to assessment. With initial cup sip pt with immediate cough with some throat clear in consecutive sips. After several trials of thin liquids and puree, signs of aspiration ceased, suspect dy pharyngeal mucosa decreased sensation for timing of swallow reponse which improved with PO. Swallow reponse at end of session felt strong and timely, pt taking small sips without difficulty. Recommend a regular diet with thin liquids with aspiration precuations  (no straws, upright, small sips,  pills whole in puree) and oral care protocol. Discussed with RN. SLP will f/u for continued tolerance.     Aspiration Risk  Mild    Diet Recommendation Regular;Thin liquid   Liquid Administration via: Cup;No straw Medication Administration: Whole meds with puree Supervision: Patient able to self feed Compensations: Slow rate;Small sips/bites Postural Changes and/or Swallow  Maneuvers: Seated upright 90 degrees    Other  Recommendations Oral Care Recommendations: Oral care BID   Follow Up Recommendations  None    Frequency and Duration min 2x/week  2 weeks   Pertinent Vitals/Pain NA    SLP Swallow Goals Patient will consume recommended diet without observed clinical signs of aspiration with: Independent assistance Patient will utilize recommended strategies during swallow to increase swallowing safety with: Independent assistance   Swallow Study Prior Functional Status       General HPI:  66 y.o. female with extensive past medical history as below, was brought to the emergency department secondary to one week history of progressive altered mental status and generalized weakness. Patient is unable to provide much history secondary to her altered mental status. History is obtained from the patient spouse and her daughter who are at the bedside. Patient has past medical history of open-dependent chronic respiratory failure, COPD, hypothyroidism, hypokalemia, thrombocytopenia, chronic lower extremity edema, moderate to severe aortic stenosis was not an operative candidate, chronic diastolic congestive heart failure, chronic pain on opioids. Diagnosis includes acute encephalopathy: Unclear etiology. CT head negative. Possibilities include dehydration, mild acute renal failure, rule out CO2 narcosis, urinary tract infection and underlying opioids.. Pt had idfficutly swallowing in ED.  Type of Study: Bedside swallow evaluation Diet Prior to this Study: NPO Temperature Spikes Noted: No Respiratory Status: Supplemental O2 delivered via (comment) History of Recent Intubation: No Behavior/Cognition: Alert;Cooperative;Pleasant mood Oral Cavity - Dentition: Dentures, top;Edentulous;Missing dentition Self-Feeding Abilities: Able to feed self Patient Positioning: Upright in bed Baseline Vocal Quality: Clear Volitional Cough: Strong Volitional Swallow: Able to elicit     Oral/Motor/Sensory Function Overall Oral Motor/Sensory Function: Appears within functional limits for tasks assessed   Ice Chips     Thin Liquid Thin Liquid: Impaired Presentation: Cup;Straw;Self Fed Pharyngeal  Phase Impairments: Throat Clearing - Immediate;Cough - Immediate    Nectar Thick Nectar Thick Liquid: Not tested   Honey Thick Honey Thick Liquid: Not tested   Puree Puree: Within functional limits   Solid   GO    Solid: Within functional limits Other Comments: pt required a thin bolus to moisten cracker due to dry mouth and poor dentition      Harlon Ditty, MA CCC-SLP [phone]  Guinn Delarosa, Riley Nearing 12/12/2011,10:40 AM

## 2011-12-12 NOTE — Progress Notes (Addendum)
Subjective:  Patient is improved from admission.  Less dyspneic. Weight down 2 lb overnight. No chest pain. Chest xray no significant change.  Objective:  Vital Signs in the last 24 hours: Temp:  [98.2 F (36.8 C)-99 F (37.2 C)] 98.2 F (36.8 C) (10/18 0519) Pulse Rate:  [64-96] 87  (10/18 0519) Resp:  [9-22] 20  (10/18 0519) BP: (103-139)/(43-74) 123/58 mmHg (10/18 0519) SpO2:  [91 %-97 %] 97 % (10/18 0519) Weight:  [226 lb 12.8 oz (102.876 kg)-228 lb 12.8 oz (103.783 kg)] 226 lb 12.8 oz (102.876 kg) (10/18 0519)  Intake/Output from previous day: 10/17 0701 - 10/18 0700 In: 848.3 [I.V.:398.3; IV Piggyback:450] Out: 651 [Urine:650; Stool:1] Intake/Output from this shift: Total I/O In: 0  Out: 151 [Urine:150; Stool:1]     . aspirin  325 mg Oral Daily  . cefTRIAXone (ROCEPHIN)  IV  1 g Intravenous Q24H  . enoxaparin (LOVENOX) injection  40 mg Subcutaneous Q24H  . insulin aspart  0-9 Units Subcutaneous Q4H  . lactulose  300 mL Rectal Once  . potassium chloride  10 mEq Intravenous Once  . potassium chloride  10 mEq Intravenous Once  . potassium chloride  10 mEq Intravenous Q1 Hr x 4  . sodium chloride  500 mL Intravenous Once  . sodium chloride  3 mL Intravenous Q12H  . DISCONTD: aspirin  300 mg Rectal Daily  . DISCONTD: potassium chloride SA  40 mEq Oral Once      . 0.9 % NaCl with KCl 20 mEq / L 50 mL/hr at 12/11/11 2202    Physical Exam: The patient appears to be in no distress.  Head and neck exam reveals that the pupils are equal and reactive.  The extraocular movements are full.  There is no scleral icterus.  Mouth and pharynx are benign.  No lymphadenopathy.  No carotid bruits.  The jugular venous pressure is normal.  Thyroid is not enlarged or tender.  Chest is clear to percussion and auscultation.  No rales or rhonchi.  Expansion of the chest is symmetrical.  Heart reveals gr 3/6 systolic ejection murmur.  The abdomen is soft and nontender.  Bowel  sounds are normoactive.  There is no hepatosplenomegaly or mass.  There are no abdominal bruits.  Extremities reveal chronic edema with induration worse on right.  Neurologic exam is normal strength and no lateralizing weakness.  No sensory deficits.  Integument reveals no rash  Lab Results:  Basename 12/11/11 1600  WBC 10.3  HGB 16.1*  PLT 125*    Basename 12/12/11 0212 12/11/11 1426  NA 141 140  K 3.1* 2.3*  CL 93* 87*  CO2 40* 42*  GLUCOSE 137* 206*  BUN 36* 36*  CREATININE 1.17* 1.23*    Basename 12/12/11 0817 12/12/11 0212  TROPONINI <0.30 <0.30   Hepatic Function Panel  Basename 12/11/11 1912  PROT 7.4  ALBUMIN 3.4*  AST 46*  ALT 22  ALKPHOS 200*  BILITOT 2.1*  BILIDIR 0.8*  IBILI 1.3*   No results found for this basename: CHOL in the last 72 hours No results found for this basename: PROTIME in the last 72 hours  Imaging: X-ray Chest Pa And Lateral   12/12/2011  *RADIOLOGY REPORT*  Clinical Data: Shortness of breath, hypertension.  Query pneumonia.  CHEST - 2 VIEW  Comparison: 12/11/2011  Findings: Ill-defined lingular density appears to been present on radiographs of the past several years, and as such as attributable to prominent epicardial adipose tissue rather than pneumonia.  Mild cardiomegaly is present with borderline prominence the main pulmonary artery.  Linear opacity projecting over the lung bases on the lateral projection probably reflects mild atelectasis in the left lower lobe.  Thoracic spondylosis noted.  IMPRESSION:  1.  Mild atelectasis in the left lower lobe. 2.  Lingular density is most attributable to epicardial adipose tissue. 3.  Mild cardiomegaly, potentially with mild prominence the main pulmonary artery.  Pulmonary arterial hypertension not excluded.   Original Report Authenticated By: Dellia Cloud, M.D.    Ct Head Wo Contrast  12/11/2011  *RADIOLOGY REPORT*  Clinical Data: Mental status change.  Evaluate for potential  cerebrovascular accident.  CT HEAD WITHOUT CONTRAST  Technique:  Contiguous axial images were obtained from the base of the skull through the vertex without contrast.  Comparison: Head CT 12/13/2009.  Findings: No acute intracranial abnormalities.  Specifically, no evidence of large vascular territory acute/subacute cerebral ischemia, no evidence of acute intracerebral hemorrhage, no focal mass, mass effect, hydrocephalus or abnormal intra or extra-axial fluid collections.  Physiologic calcifications of the basal ganglia bilaterally (left greater than right) are unchanged.  No acute displaced skull fractures are identified.  Visualized paranasal sinuses and mastoids are well pneumatized.  IMPRESSION: 1.  No acute intracranial abnormalities. 2.  The appearance of the brain is normal.   Original Report Authenticated By: Florencia Reasons, M.D.    Dg Chest Port 1 View  12/11/2011  *RADIOLOGY REPORT*  Clinical Data: Chest pain and shortness of breath.  PORTABLE CHEST - 1 VIEW  Comparison: Chest x-ray 11/21/2011.  Findings: Lung volumes are normal.  There is an ill-defined opacity in the periphery of the left base which may be projectional (the patient is significantly rotated to the left), however, the region of airspace consolidation in the lingula is not excluded.  Lungs otherwise appear clear.  There is mild cephalization of the pulmonary vasculature, without frank pulmonary edema.  Mild cardiomegaly is unchanged.  No definite pleural effusions. The patient is rotated to the left on today's exam, resulting in distortion of the mediastinal contours and reduced diagnostic sensitivity and specificity for mediastinal pathology.  IMPRESSION: 1.  Study is limited by leftward rotation of the patient.  There may be some lingular air space consolidation which could be concerning for infection.  However, this may alternatively be projectional.  Repeat properly centered a standing PA and lateral chest radiograph is  recommended. 2.  Mild cardiomegaly with mild pulmonary venous congestion, but no frank pulmonary edema.   Original Report Authenticated By: Florencia Reasons, M.D.     Cardiac Studies: Telemetry shows NSR Assessment/Plan:  Patient Active Hospital Problem List:  AORTIC STENOSIS (02/06/2009)   Assessment: Inoperable   Plan: Conservative care.  Hypokalemia (12/11/2011)   Assessment: potassium improved 3.1 this am   Plan: Will restart aldactone. Daily BMET    LOS: 1 day    Cassell Clement 12/12/2011, 11:35 AM

## 2011-12-13 DIAGNOSIS — R5383 Other fatigue: Secondary | ICD-10-CM

## 2011-12-13 DIAGNOSIS — I5033 Acute on chronic diastolic (congestive) heart failure: Secondary | ICD-10-CM

## 2011-12-13 DIAGNOSIS — I509 Heart failure, unspecified: Secondary | ICD-10-CM

## 2011-12-13 DIAGNOSIS — I5032 Chronic diastolic (congestive) heart failure: Secondary | ICD-10-CM

## 2011-12-13 LAB — GLUCOSE, CAPILLARY: Glucose-Capillary: 174 mg/dL — ABNORMAL HIGH (ref 70–99)

## 2011-12-13 LAB — CBC
Hemoglobin: 12.6 g/dL (ref 12.0–15.0)
MCH: 28.4 pg (ref 26.0–34.0)
MCHC: 32.8 g/dL (ref 30.0–36.0)
MCV: 86.7 fL (ref 78.0–100.0)
Platelets: 74 10*3/uL — ABNORMAL LOW (ref 150–400)

## 2011-12-13 LAB — BASIC METABOLIC PANEL
BUN: 30 mg/dL — ABNORMAL HIGH (ref 6–23)
BUN: 29 mg/dL — ABNORMAL HIGH (ref 6–23)
Calcium: 8.8 mg/dL (ref 8.4–10.5)
Chloride: 95 mEq/L — ABNORMAL LOW (ref 96–112)
Creatinine, Ser: 1.02 mg/dL (ref 0.50–1.10)
GFR calc Af Amer: 65 mL/min — ABNORMAL LOW (ref >90)
GFR calc non Af Amer: 61 mL/min — ABNORMAL LOW (ref >90)
Glucose, Bld: 129 mg/dL — ABNORMAL HIGH (ref 70–99)
Glucose, Bld: 169 mg/dL — ABNORMAL HIGH (ref 70–99)

## 2011-12-13 LAB — URINE CULTURE

## 2011-12-13 LAB — AMMONIA: Ammonia: 44 umol/L (ref 11–60)

## 2011-12-13 LAB — MAGNESIUM: Magnesium: 2.4 mg/dL (ref 1.5–2.5)

## 2011-12-13 LAB — PRO B NATRIURETIC PEPTIDE: Pro B Natriuretic peptide (BNP): 197.8 pg/mL — ABNORMAL HIGH (ref 0–125)

## 2011-12-13 MED ORDER — POTASSIUM CHLORIDE CRYS ER 20 MEQ PO TBCR
40.0000 meq | EXTENDED_RELEASE_TABLET | ORAL | Status: AC
  Administered 2011-12-13 – 2011-12-14 (×4): 40 meq via ORAL
  Filled 2011-12-13 (×4): qty 2

## 2011-12-13 MED ORDER — POTASSIUM CHLORIDE CRYS ER 20 MEQ PO TBCR
40.0000 meq | EXTENDED_RELEASE_TABLET | ORAL | Status: DC
  Administered 2011-12-13 (×2): 40 meq via ORAL

## 2011-12-13 NOTE — Progress Notes (Signed)
Subjective: Denies CP  NO SOB Objective: Filed Vitals:   12/12/11 0519 12/12/11 1248 12/12/11 2133 12/13/11 0430  BP: 123/58 115/70 107/43 141/48  Pulse: 87 87 72 74  Temp: 98.2 F (36.8 C) 98.2 F (36.8 C) 98.3 F (36.8 C) 98.5 F (36.9 C)  TempSrc: Oral Oral Oral Oral  Resp: 20 20 18 20   Height:      Weight: 226 lb 12.8 oz (102.876 kg)   228 lb 8 oz (103.647 kg)  SpO2: 97% 97% 100% 100%   Weight change: -4.8 oz (-0.136 kg)  Intake/Output Summary (Last 24 hours) at 12/13/11 0857 Last data filed at 12/13/11 0825  Gross per 24 hour  Intake   1220 ml  Output   1201 ml  Net     19 ml    General: Alert, awake, oriented x3, in no acute distress Neck:  JVP is normal Heart: Regular rate and rhythm  Gr III/VI systolic murmur at base Lungs: Diffuse rhonchi  No rales. Exemities:Chronic stasis changes  1+ edema  (improved) Neuro: Grossly intact, nonfocal.  Tele:  SR Lab Results: Results for orders placed during the hospital encounter of 12/11/11 (from the past 24 hour(s))  GLUCOSE, CAPILLARY     Status: Abnormal   Collection Time   12/12/11 11:58 AM      Component Value Range   Glucose-Capillary 208 (*) 70 - 99 mg/dL  GLUCOSE, CAPILLARY     Status: Abnormal   Collection Time   12/12/11  5:29 PM      Component Value Range   Glucose-Capillary 150 (*) 70 - 99 mg/dL  GLUCOSE, CAPILLARY     Status: Abnormal   Collection Time   12/12/11  8:16 PM      Component Value Range   Glucose-Capillary 137 (*) 70 - 99 mg/dL   Comment 1 Notify RN     Comment 2 Documented in Chart    GLUCOSE, CAPILLARY     Status: Abnormal   Collection Time   12/13/11 12:21 AM      Component Value Range   Glucose-Capillary 123 (*) 70 - 99 mg/dL  BASIC METABOLIC PANEL     Status: Abnormal   Collection Time   12/13/11  6:05 AM      Component Value Range   Sodium 141  135 - 145 mEq/L   Potassium 2.3 (*) 3.5 - 5.1 mEq/L   Chloride 94 (*) 96 - 112 mEq/L   CO2 38 (*) 19 - 32 mEq/L   Glucose, Bld 129  (*) 70 - 99 mg/dL   BUN 30 (*) 6 - 23 mg/dL   Creatinine, Ser 1.61  0.50 - 1.10 mg/dL   Calcium 8.8  8.4 - 09.6 mg/dL   GFR calc non Af Amer 61 (*) >90 mL/min   GFR calc Af Amer 70 (*) >90 mL/min  CBC     Status: Abnormal   Collection Time   12/13/11  6:05 AM      Component Value Range   WBC 4.1  4.0 - 10.5 K/uL   RBC 4.43  3.87 - 5.11 MIL/uL   Hemoglobin 12.6  12.0 - 15.0 g/dL   HCT 04.5  40.9 - 81.1 %   MCV 86.7  78.0 - 100.0 fL   MCH 28.4  26.0 - 34.0 pg   MCHC 32.8  30.0 - 36.0 g/dL   RDW 91.4  78.2 - 95.6 %   Platelets 74 (*) 150 - 400 K/uL  AMMONIA  Status: Normal   Collection Time   12/13/11  6:05 AM      Component Value Range   Ammonia 44  11 - 60 umol/L  MAGNESIUM     Status: Normal   Collection Time   12/13/11  8:30 AM      Component Value Range   Magnesium 2.4  1.5 - 2.5 mg/dL    Studies/Results: @RISRSLT24 @  Medications: Reviewed   Patient Active Hospital Problem List: AORTIC STENOSIS (02/06/2009)   Assessment: Severe.  Not an operative candidate with other medical issues. Plan to continue medical Rx for volume  VENOUS INSUFFICIENCY (02/06/2009)   COPD (02/06/2009)   Assessment: Severe  Chronic diastolic CHF (congestive heart failure) (10/27/2011)   Assessment: Volume is pretty good.  Being repleted with KCL She is off lasix now.  Was on 80 AM and 40 PM.  Will need to get back on some  Once K repleted.  Check MG.    Hypokalemia (12/11/2011)   Assessment: Aldactone added.  Need to replete    LOS: 2 days   Dietrich Pates 12/13/2011, 8:57 AM

## 2011-12-13 NOTE — Progress Notes (Signed)
[facility] PROGRESS NOTE  [name] ZOX:[id number] DOB: Nov 20, 1945 DOA: 12/11/2011 PCP: [name], MD  Assessment/Plan: 1. Acute encephalopathy: Unclear etiology. Much improved this am.  CT head negative. Possibilities include dehydration, mild acute renal failure, and underlying opioids. continue gentle hydration. D/c rocephin day #2. resume opioids- encouraged patient to speak with PCP about decreasing dose. Speech therapy rec regular diet. Will provide lactulose.  2. hypokalemia Mg ok, restart aldactone, K dur PO 3. Dehydration: resolved, will need to restart lasix  4. Severe hypokalemia: Secondary to diuretics and poor oral intake: IV replete and follow BMP. Magnesium 2.3 5. No UTI 6. Prolonged QTC: Repeat EKG in a.m. after repleting potassium and magnesium. 7. Chronic lower extremity edema: Seem to have significantly improved after outpatient diuretic treatment. 8. Hypothyroidism: TSH low, add free t4 9. Hypoglycemia: Check A1c and place on sliding scale insulin. 10. Aortic stenosis: Not a surgical candidate. 11. Elevated troponin: Likely demand ischemia. Cards following 12. Thrombocytopenia: trend 13. Chronic respiratory failure: Continue O2. Bronchodilator meds.  14. Chronic diastolic congestive heart failure: resume lasix once K+ repleated    Code Status: DNR Family Communication: pt at bedside Disposition Plan: home when ready   Consultants:  cards  Procedures:  none  Antibiotics:  Rocephin 12/11/11>>>d/c 10/19  HPI/Subjective: No BM yet today, eating well No SOB, no CP  Objective: Filed Vitals:   12/12/11 1248 12/12/11 2133 12/13/11 0430 12/13/11 1119  BP: 115/70 107/43 141/48 136/48  Pulse: 87 72 74 72  Temp: 98.2 F (36.8 C) 98.3 F (36.8 C) 98.5 F (36.9 C)   TempSrc: Oral Oral Oral   Resp: 20 18 20 18   Height:      Weight:   103.647 kg (228 lb 8 oz)   SpO2: 97% 100% 100% 100%    Intake/Output Summary (Last 24 hours) at  12/13/11 1155 Last data filed at 12/13/11 0825  Gross per 24 hour  Intake   1070 ml  Output   1050 ml  Net     20 ml   Filed Weights   12/11/11 2020 12/12/11 0519 12/13/11 0430  Weight: 103.783 kg (228 lb 12.8 oz) 102.876 kg (226 lb 12.8 oz) 103.647 kg (228 lb 8 oz)    Exam:   General:  Awake alert oriented x3  Cardiovascular: RRR +murmur trace LEE  Respiratory: normal effort BSCTAB   Abdomen: obese soft +BS non-tender  Data Reviewed: Basic Metabolic Panel:  Lab 12/13/11 8119 12/13/11 0605 12/12/11 0212 12/11/11 2122 12/11/11 1426  NA -- 141 141 -- 140  K -- 2.3* 3.1* -- 2.3*  CL -- 94* 93* -- 87*  CO2 -- 38* 40* -- 42*  GLUCOSE -- 129* 137* -- 206*  BUN -- 30* 36* -- 36*  CREATININE -- 0.96 1.17* -- 1.23*  CALCIUM -- 8.8 8.9 -- 9.4  MG 2.4 -- -- 2.3 --  PHOS -- -- -- -- --   Liver Function Tests:  Lab 12/11/11 1912  AST 46*  ALT 22  ALKPHOS 200*  BILITOT 2.1*  PROT 7.4  ALBUMIN 3.4*   No results found for this basename: LIPASE:5,AMYLASE:5 in the last 168 hours  Lab 12/13/11 0605 12/11/11 1911  AMMONIA 44 164*   CBC:  Lab 12/13/11 0605 12/11/11 1600  WBC 4.1 10.3  NEUTROABS -- --  HGB 12.6 16.1*  HCT 38.4 48.5*  MCV 86.7 87.5  PLT 74* 125*   Cardiac Enzymes:  Lab 12/12/11 0817 12/12/11 0212 12/11/11 2123  CKTOTAL -- -- --  CKMB -- -- --  CKMBINDEX -- -- --  TROPONINI <0.30 <0.30 <0.30   BNP (last 3 results)  Basename 12/11/11 1425 11/21/11 1733  PROBNP 300.4* 128.2*   CBG:  Lab 12/13/11 0021 12/12/11 2016 12/12/11 1729 12/12/11 1158 12/12/11 0800  GLUCAP 123* 137* 150* 208* 134*    Recent Results (from the past 240 hour(s))  URINE CULTURE     Status: Normal   Collection Time   12/12/11  1:27 AM      Component Value Range Status Comment   Specimen Description URINE, RANDOM   Final    Special Requests NONE   Final    Culture  Setup Time 12/12/2011 08:57   Final    Colony Count 3,000 COLONIES/ML   Final    Culture INSIGNIFICANT  GROWTH   Final    Report Status 12/13/2011 FINAL   Final      Studies: X-ray Chest Pa And Lateral   12/12/2011  *RADIOLOGY REPORT*  Clinical Data: Shortness of breath, hypertension.  Query pneumonia.  CHEST - 2 VIEW  Comparison: 12/11/2011  Findings: Ill-defined lingular density appears to been present on radiographs of the past several years, and as such as attributable to prominent epicardial adipose tissue rather than pneumonia.  Mild cardiomegaly is present with borderline prominence the main pulmonary artery.  Linear opacity projecting over the lung bases on the lateral projection probably reflects mild atelectasis in the left lower lobe.  Thoracic spondylosis noted.  IMPRESSION:  1.  Mild atelectasis in the left lower lobe. 2.  Lingular density is most attributable to epicardial adipose tissue. 3.  Mild cardiomegaly, potentially with mild prominence the main pulmonary artery.  Pulmonary arterial hypertension not excluded.   Original Report Authenticated By: Dellia Cloud, M.D.    Ct Head Wo Contrast  12/11/2011  *RADIOLOGY REPORT*  Clinical Data: Mental status change.  Evaluate for potential cerebrovascular accident.  CT HEAD WITHOUT CONTRAST  Technique:  Contiguous axial images were obtained from the base of the skull through the vertex without contrast.  Comparison: Head CT 12/13/2009.  Findings: No acute intracranial abnormalities.  Specifically, no evidence of large vascular territory acute/subacute cerebral ischemia, no evidence of acute intracerebral hemorrhage, no focal mass, mass effect, hydrocephalus or abnormal intra or extra-axial fluid collections.  Physiologic calcifications of the basal ganglia bilaterally (left greater than right) are unchanged.  No acute displaced skull fractures are identified.  Visualized paranasal sinuses and mastoids are well pneumatized.  IMPRESSION: 1.  No acute intracranial abnormalities. 2.  The appearance of the brain is normal.   Original Report  Authenticated By: Florencia Reasons, M.D.    Dg Chest Port 1 View  12/11/2011  *RADIOLOGY REPORT*  Clinical Data: Chest pain and shortness of breath.  PORTABLE CHEST - 1 VIEW  Comparison: Chest x-ray 11/21/2011.  Findings: Lung volumes are normal.  There is an ill-defined opacity in the periphery of the left base which may be projectional (the patient is significantly rotated to the left), however, the region of airspace consolidation in the lingula is not excluded.  Lungs otherwise appear clear.  There is mild cephalization of the pulmonary vasculature, without frank pulmonary edema.  Mild cardiomegaly is unchanged.  No definite pleural effusions. The patient is rotated to the left on today's exam, resulting in distortion of the mediastinal contours and reduced diagnostic sensitivity and specificity for mediastinal pathology.  IMPRESSION: 1.  Study is limited by leftward rotation of the patient.  There may be some lingular  air space consolidation which could be concerning for infection.  However, this may alternatively be projectional.  Repeat properly centered a standing PA and lateral chest radiograph is recommended. 2.  Mild cardiomegaly with mild pulmonary venous congestion, but no frank pulmonary edema.   Original Report Authenticated By: Florencia Reasons, M.D.     Scheduled Meds:    . aspirin  325 mg Oral Daily  . cefTRIAXone (ROCEPHIN)  IV  1 g Intravenous Q24H  . enoxaparin (LOVENOX) injection  40 mg Subcutaneous Q24H  . famotidine  20 mg Oral Daily  . FLUoxetine  20 mg Oral Daily  . insulin aspart  0-9 Units Subcutaneous Q4H  . lactulose  10 g Oral BID  . levothyroxine  137 mcg Oral QAC breakfast  . metoprolol succinate  25 mg Oral Daily  . morphine  120 mg Oral BID  . potassium chloride  40 mEq Oral Q3H  . senna-docusate  4 tablet Oral BID  . sodium chloride  3 mL Intravenous Q12H  . spironolactone  25 mg Oral Daily   Continuous Infusions:    . 0.9 % NaCl with KCl 20 mEq / L  50 mL/hr at 12/11/11 2202    Principal Problem:  *Encephalopathy acute Active Problems:  HYPOTHYROIDISM  THROMBOCYTOPENIA  AORTIC STENOSIS  VENOUS INSUFFICIENCY  COPD  Chronic diastolic CHF (congestive heart failure)  Dehydration  UTI (lower urinary tract infection)  Hypokalemia  Respiratory failure, chronic    Time spent: 30 minutes    Shayann Garbutt [facility]  12/13/2011, 11:55 AM  LOS: 2 days

## 2011-12-13 NOTE — Progress Notes (Signed)
CRITICAL VALUE ALERT  Critical value receivepotassium= 2.3  Date of notification:  12/13/11  Time of notification:  0800 Critical value read back:yes  Nurse who received alert:  [email or web address]  MD notified (1st page):  Dr. Benjamine Mola  Time of first page:  0810 MD notified (2nd page):none  Time of second page:none Responding MD:  Dr. Benjamine Mola  Time MD responded: [phone]

## 2011-12-14 LAB — GLUCOSE, CAPILLARY
Glucose-Capillary: 140 mg/dL — ABNORMAL HIGH (ref 70–99)
Glucose-Capillary: 155 mg/dL — ABNORMAL HIGH (ref 70–99)

## 2011-12-14 LAB — CBC
Hemoglobin: 14.1 g/dL (ref 12.0–15.0)
MCH: 28.8 pg (ref 26.0–34.0)
MCHC: 32.5 g/dL (ref 30.0–36.0)
MCV: 88.6 fL (ref 78.0–100.0)

## 2011-12-14 LAB — BASIC METABOLIC PANEL
BUN: 27 mg/dL — ABNORMAL HIGH (ref 6–23)
CO2: 37 mEq/L — ABNORMAL HIGH (ref 19–32)
Calcium: 9.3 mg/dL (ref 8.4–10.5)
Creatinine, Ser: 0.96 mg/dL (ref 0.50–1.10)
GFR calc non Af Amer: 61 mL/min — ABNORMAL LOW (ref >90)
Glucose, Bld: 123 mg/dL — ABNORMAL HIGH (ref 70–99)
Sodium: 144 mEq/L (ref 135–145)

## 2011-12-14 MED ORDER — LACTULOSE 10 GM/15ML PO SOLN: 10.0000 g | Freq: Two times a day (BID) | ORAL | Status: DC | PRN

## 2011-12-14 MED ORDER — ASPIRIN 325 MG PO TABS: 325.0000 mg | ORAL_TABLET | Freq: Every day | ORAL | Status: DC

## 2011-12-14 MED ORDER — FUROSEMIDE 80 MG PO TABS
80.0000 mg | ORAL_TABLET | Freq: Every day | ORAL | Status: DC
  Administered 2011-12-14: 80 mg via ORAL
  Filled 2011-12-14 (×2): qty 1

## 2011-12-14 MED ORDER — SPIRONOLACTONE 25 MG PO TABS: 25.0000 mg | ORAL_TABLET | Freq: Every day | ORAL | Status: DC

## 2011-12-14 MED ORDER — POTASSIUM CHLORIDE CRYS ER 20 MEQ PO TBCR
20.0000 meq | EXTENDED_RELEASE_TABLET | Freq: Every day | ORAL | Status: DC
  Administered 2011-12-14: 20 meq via ORAL
  Filled 2011-12-14: qty 1

## 2011-12-14 NOTE — Progress Notes (Signed)
All d/c instructions explained and given to pt.  Verbalized understanding. .  Mairany Bruno, RN. 

## 2011-12-14 NOTE — Care Management Note (Signed)
    Page 1 of 1   12/14/2011     7:10:44 PM   CARE MANAGEMENT NOTE 12/14/2011  Patient:  OMUNIQUE, OSTRAND   Account Number:  [id number]  Date Initiated:  12/14/2011  Documentation initiated by:  [facility]  Subjective/Objective Assessment:   Encephalopathy     Action/Plan:   HH needed   Anticipated DC Date:  12/14/2011   Anticipated DC Plan:  HOME W HOME HEALTH SERVICES      DC Planning Services  CM consult      [facility] Choice  HOME HEALTH  Resumption Of Svcs/PTA Provider   Choice offered to / List presented to:  C-1 Patient        HH arranged  HH-1 RN      [facility] agency  [facility]   Status of service:  Completed, signed off Medicare Important Message given?   (If response is "NO", the following Medicare IM given date fields will be blank) Date Medicare IM given:   Date Additional Medicare IM given:    Discharge Disposition:  HOME W HOME HEALTH SERVICES  Per UR Regulation:    If discussed at Long Length of Stay Meetings, dates discussed:    Comments:  12/14/2011 1900 NCM spoke to pt and gave permission to speak to husband. States pt had [facility] prior to admission. He has already spoken to her [facility] RN that comes because she lives on the same street. Will fax info to [facility] that pt d/c home today and needing lab drawn on Tues. Will discuss with Corrie Dandy, [facility] rep in am.  Isidoro Donning RN CCM Case Mgmt phone [phone]

## 2011-12-14 NOTE — Progress Notes (Signed)
Subjective: Patient denies SOB and CP Objective: Filed Vitals:   12/13/11 1119 12/13/11 1359 12/13/11 2058 12/14/11 0623  BP: 136/48 121/57 119/43 113/71  Pulse: 72 78 72 76  Temp:  98.2 F (36.8 C) 98.3 F (36.8 C) 97.8 F (36.6 C)  TempSrc:  Oral Oral Oral  Resp: 18 19 18 19   Height:      Weight:    230 lb 6.1 oz (104.5 kg)  SpO2: 100% 97% 98% 93%   Weight change: 1 lb 14.1 oz (0.853 kg)  Intake/Output Summary (Last 24 hours) at 12/14/11 0743 Last data filed at 12/14/11 0631  Gross per 24 hour  Intake    780 ml  Output    850 ml  Net    -70 ml    General: Alert, awake, oriented x3, in no acute distress Neck:  JVP is normal Heart: Regular rate and rhythm  Gr III/VI systolic murmur Lungs: Mild rhonchi Exemities:  Tr  edema.   Neuro: Grossly intact, nonfocal.  Tele:  SR Lab Results: Results for orders placed during the hospital encounter of 12/11/11 (from the past 24 hour(s))  GLUCOSE, CAPILLARY     Status: Abnormal   Collection Time   12/13/11  8:05 AM      Component Value Range   Glucose-Capillary 199 (*) 70 - 99 mg/dL   Comment 1 Notify RN     Comment 2 Documented in Chart    MAGNESIUM     Status: Normal   Collection Time   12/13/11  8:30 AM      Component Value Range   Magnesium 2.4  1.5 - 2.5 mg/dL  PRO B NATRIURETIC PEPTIDE     Status: Abnormal   Collection Time   12/13/11  8:30 AM      Component Value Range   Pro B Natriuretic peptide (BNP) 197.8 (*) 0 - 125 pg/mL  BASIC METABOLIC PANEL     Status: Abnormal   Collection Time   12/13/11  3:50 PM      Component Value Range   Sodium 138  135 - 145 mEq/L   Potassium 2.6 (*) 3.5 - 5.1 mEq/L   Chloride 95 (*) 96 - 112 mEq/L   CO2 36 (*) 19 - 32 mEq/L   Glucose, Bld 169 (*) 70 - 99 mg/dL   BUN 29 (*) 6 - 23 mg/dL   Creatinine, Ser 1.61  0.50 - 1.10 mg/dL   Calcium 8.9  8.4 - 09.6 mg/dL   GFR calc non Af Amer 56 (*) >90 mL/min   GFR calc Af Amer 65 (*) >90 mL/min  T4, FREE     Status: Abnormal   Collection Time   12/13/11  3:50 PM      Component Value Range   Free T4 1.90 (*) 0.80 - 1.80 ng/dL  GLUCOSE, CAPILLARY     Status: Abnormal   Collection Time   12/13/11  3:59 PM      Component Value Range   Glucose-Capillary 174 (*) 70 - 99 mg/dL   Comment 1 Notify RN     Comment 2 Documented in Chart    GLUCOSE, CAPILLARY     Status: Abnormal   Collection Time   12/13/11  8:03 PM      Component Value Range   Glucose-Capillary 135 (*) 70 - 99 mg/dL   Comment 1 Notify RN    GLUCOSE, CAPILLARY     Status: Abnormal   Collection Time   12/14/11 12:13 AM  Component Value Range   Glucose-Capillary 140 (*) 70 - 99 mg/dL   Comment 1 Documented in Chart     Comment 2 Notify RN    GLUCOSE, CAPILLARY     Status: Abnormal   Collection Time   12/14/11  4:01 AM      Component Value Range   Glucose-Capillary 134 (*) 70 - 99 mg/dL   Comment 1 Documented in Chart     Comment 2 Notify RN    CBC     Status: Abnormal   Collection Time   12/14/11  5:05 AM      Component Value Range   WBC 4.6  4.0 - 10.5 K/uL   RBC 4.90  3.87 - 5.11 MIL/uL   Hemoglobin 14.1  12.0 - 15.0 g/dL   HCT 60.4  54.0 - 98.1 %   MCV 88.6  78.0 - 100.0 fL   MCH 28.8  26.0 - 34.0 pg   MCHC 32.5  30.0 - 36.0 g/dL   RDW 19.1  47.8 - 29.5 %   Platelets 82 (*) 150 - 400 K/uL  BASIC METABOLIC PANEL     Status: Abnormal   Collection Time   12/14/11  5:05 AM      Component Value Range   Sodium 144  135 - 145 mEq/L   Potassium 3.7  3.5 - 5.1 mEq/L   Chloride 102  96 - 112 mEq/L   CO2 37 (*) 19 - 32 mEq/L   Glucose, Bld 123 (*) 70 - 99 mg/dL   BUN 27 (*) 6 - 23 mg/dL   Creatinine, Ser 6.21  0.50 - 1.10 mg/dL   Calcium 9.3  8.4 - 30.8 mg/dL   GFR calc non Af Amer 61 (*) >90 mL/min   GFR calc Af Amer 70 (*) >90 mL/min    Studies/Results: @RISRSLT24 @  Medications:Reviewed   Patient Active Hospital Problem List:  1.  Aortic Stenosis.  Severe.  Medical Rx.  Not operative candidate  2.  Diastolic CHF.   Volume status appears OK  I would resume 1 Lasix  80 in AM with 20 KCl  Will make sure she has BMET on Tues Amil Amen, Home Medicare) 3.  COPD  Severe    LOS: 3 days   Dietrich Pates 12/14/2011, 7:43 AM

## 2011-12-14 NOTE — Discharge Summary (Signed)
Physician Discharge Summary  [name] LKG:[id number] DOB: 11-Jun-1945 DOA: 12/11/2011  PCP: [name], MD  Admit date: 12/11/2011 Discharge date: 12/14/2011  Recommendations for Outpatient Follow-up:  1. Home health RN- BMP on Tuesday 2. Repeat free t4 and TSH- may need to decrease synthroid dose (defer to PCP)  Discharge Diagnoses:  Principal Problem:  *Encephalopathy acute Active Problems:  HYPOTHYROIDISM  THROMBOCYTOPENIA  AORTIC STENOSIS  VENOUS INSUFFICIENCY  COPD  Chronic diastolic CHF (congestive heart failure)  Dehydration  UTI (lower urinary tract infection)  Hypokalemia  Respiratory failure, chronic   Discharge Condition: improved  Diet recommendation: cardiac  Filed Weights   12/12/11 0519 12/13/11 0430 12/14/11 6644  Weight: 102.876 kg (226 lb 12.8 oz) 103.647 kg (228 lb 8 oz) 104.5 kg (230 lb 6.1 oz)    History of present illness:  [name] is a 66 y.o. female with extensive past medical history as below, was brought to the emergency department secondary to one week history of progressive altered mental status and generalized weakness. Patient is unable to provide much history secondary to her altered mental status. History is obtained from the patient spouse and her daughter who are at the bedside. Patient has past medical history of open-dependent chronic respiratory failure, COPD, hypothyroidism, hypokalemia, thrombocytopenia, chronic lower extremity edema, moderate to severe aortic stenosis was not an operative candidate, chronic diastolic congestive heart failure, chronic pain on opioids. Patient's diuretics are being adjusted as an outpatient. Since one week family has noted that patient has had intermittent confusion which is progressively gotten worse.?? Facial asymmetry but no asymmetrical limb weakness or dysarthria. She at times has complained of seeing double. Since being in the ED, her confusion and alertness seemed to have improved  according to family. They deny fever or chills but apparently patient complains of feeling cold. She has complained of dysuria. No nausea, vomiting or diarrhea. Oral intake has decreased and so has oral fluid intake. Urinary output has decreased in the last 24 hours. She has gotten progressively weak when she cannot take a couple of steps without risk of falling. No chest pain or worsening dyspnea. Cough with minimal white sputum. Evaluation in the ED shows patient to be clinically dry, CT head negative for acute findings, severe hypokalemia, troponin of 0.15. Cardiology has consulted and did not think that patient has any acute cardiac events. The hospitalist service is requested to admit for further evaluation and management.   Hospital Course:  Patient was given lactulose and had large BM, ammonia went down accordingly and patient woke up.   Dehydration- IV fluid, patient eating, restarted diuretics LE edema- stable, resume lasix/aldactone Constipation secondary to pain medications-  hypothryroid- dec TSH and inc Ft4- defer to PCP for decreased dose and repeat AS- not a surgical candidate hypokalemia repleated  Consultations:  cardiology  Discharge Exam: Filed Vitals:   12/13/11 1119 12/13/11 1359 12/13/11 2058 12/14/11 0623  BP: 136/48 121/57 119/43 113/71  Pulse: 72 78 72 76  Temp:  98.2 F (36.8 C) 98.3 F (36.8 C) 97.8 F (36.6 C)  TempSrc:  Oral Oral Oral  Resp: 18 19 18 19   Height:      Weight:    104.5 kg (230 lb 6.1 oz)  SpO2: 100% 97% 98% 93%    General: A+Ox3, asking to go home Cardiovascular: rrr Respiratory: cleat anterior but decreased  Discharge Instructions      Discharge Orders    Future Appointments: Provider: Department: Dept Phone: Center:   12/23/2011  2:00 PM [facility] [phone] [facility]   12/26/2011 1:30 PM [facility] [phone] None   12/26/2011 3:00 PM Tonny Bollman, MD  [facility] [phone] None   12/26/2011 3:30 PM Purcell Nails, MD [facility] [phone] None   01/20/2012 2:45 PM Wendall Stade, MD [facility] [phone] [facility]     Future Orders Please Complete By Expires   Diet - low sodium heart healthy      Increase activity slowly      Discharge instructions      Comments:   Can use miralax daily for BM- if unable to have- use lactulose Resume home health RN- BMP on Tuesday   (HEART FAILURE PATIENTS) Call MD:  Anytime you have any of the following symptoms: 1) 3 pound weight gain in 24 hours or 5 pounds in 1 week 2) shortness of breath, with or without a dry hacking cough 3) swelling in the hands, feet or stomach 4) if you have to sleep on extra pillows at night in order to breathe.          Medication List     As of 12/14/2011 10:59 AM    TAKE these medications         acetaminophen 325 MG tablet   Commonly known as: TYLENOL   Take 650 mg by mouth every 6 (six) hours as needed. For pain      aspirin 325 MG tablet   Take 1 tablet (325 mg total) by mouth daily.      dextromethorphan-guaiFENesin 30-600 MG per 12 hr tablet   Commonly known as: MUCINEX DM   Take 1 tablet by mouth every 12 (twelve) hours.      famotidine 20 MG tablet   Commonly known as: PEPCID   Take 1 tablet (20 mg total) by mouth daily. For indigestion      FLUoxetine 20 MG capsule   Commonly known as: PROZAC   Take 20 mg by mouth daily.      furosemide 80 MG tablet   Commonly known as: LASIX   Take 1 tablet (80 mg total) by mouth as directed. Take 1 tablet ( 80 mg) in the am take one half tablet (40 mg ) in the pm if weight is up by 2 lbs take two tablets (80 mg) twice a day      lactulose 10 GM/15ML solution   Commonly known as: CHRONULAC   Take 15 mLs (10 g total) by mouth 2 (two) times daily as needed (constipation).      levothyroxine 137 MCG tablet   Commonly known as: SYNTHROID, LEVOTHROID   Take 137 mcg by  mouth daily.      metoprolol succinate 25 MG 24 hr tablet   Commonly known as: TOPROL-XL   Take 25 mg by mouth daily.      morphine 15 MG tablet   Commonly known as: MSIR   Take 15 mg by mouth every 4 (four) hours as needed. pain      morphine 60 MG 12 hr tablet   Commonly known as: MS CONTIN   Take 120 mg by mouth 2 (two) times daily.      potassium chloride 10 MEQ tablet   Commonly known as: K-DUR,KLOR-CON   Take 10 mEq by mouth 2 (two) times daily.      senna-docusate 8.6-50 MG per tablet   Commonly known as: Senokot-S   Take 4 tablets by mouth 2 (two)  times daily. For constipation.      spironolactone 25 MG tablet   Commonly known as: ALDACTONE   Take 1 tablet (25 mg total) by mouth daily.         Follow-up Information    Follow up with [name], MD. In 1 week.   Contact information:   [address] [place] [address] [phone]           The results of significant diagnostics from this hospitalization (including imaging, microbiology, ancillary and laboratory) are listed below for reference.    Significant Diagnostic Studies: X-ray Chest Pa And Lateral   12/12/2011  *RADIOLOGY REPORT*  Clinical Data: Shortness of breath, hypertension.  Query pneumonia.  CHEST - 2 VIEW  Comparison: 12/11/2011  Findings: Ill-defined lingular density appears to been present on radiographs of the past several years, and as such as attributable to prominent epicardial adipose tissue rather than pneumonia.  Mild cardiomegaly is present with borderline prominence the main pulmonary artery.  Linear opacity projecting over the lung bases on the lateral projection probably reflects mild atelectasis in the left lower lobe.  Thoracic spondylosis noted.  IMPRESSION:  1.  Mild atelectasis in the left lower lobe. 2.  Lingular density is most attributable to epicardial adipose tissue. 3.  Mild cardiomegaly, potentially with mild prominence the main pulmonary artery.   Pulmonary arterial hypertension not excluded.   Original Report Authenticated By: Dellia Cloud, M.D.    Dg Chest 2 View  11/21/2011  *RADIOLOGY REPORT*  Clinical Data: Shortness of breath.  Lower extremity swelling. Cough.  CHEST - 2 VIEW  Comparison: 11/05/2011.  Findings: Trachea is midline.  Surgical clips are seen in the high right paratracheal region.  Heart is enlarged, stable.  Lungs are somewhat low in volume with mild diffuse interstitial prominence. Probable bibasilar atelectasis or scarring.  No definite pleural fluid.  Minimal anterior wedging of lower thoracic vertebral bodies, as before.  IMPRESSION: Mild edema versus chronic interstitial prominence.   Original Report Authenticated By: Reyes Ivan, M.D.    Ct Head Wo Contrast  12/11/2011  *RADIOLOGY REPORT*  Clinical Data: Mental status change.  Evaluate for potential cerebrovascular accident.  CT HEAD WITHOUT CONTRAST  Technique:  Contiguous axial images were obtained from the base of the skull through the vertex without contrast.  Comparison: Head CT 12/13/2009.  Findings: No acute intracranial abnormalities.  Specifically, no evidence of large vascular territory acute/subacute cerebral ischemia, no evidence of acute intracerebral hemorrhage, no focal mass, mass effect, hydrocephalus or abnormal intra or extra-axial fluid collections.  Physiologic calcifications of the basal ganglia bilaterally (left greater than right) are unchanged.  No acute displaced skull fractures are identified.  Visualized paranasal sinuses and mastoids are well pneumatized.  IMPRESSION: 1.  No acute intracranial abnormalities. 2.  The appearance of the brain is normal.   Original Report Authenticated By: Florencia Reasons, M.D.    Dg Chest Port 1 View  12/11/2011  *RADIOLOGY REPORT*  Clinical Data: Chest pain and shortness of breath.  PORTABLE CHEST - 1 VIEW  Comparison: Chest x-ray 11/21/2011.  Findings: Lung volumes are normal.  There is an  ill-defined opacity in the periphery of the left base which may be projectional (the patient is significantly rotated to the left), however, the region of airspace consolidation in the lingula is not excluded.  Lungs otherwise appear clear.  There is mild cephalization of the pulmonary vasculature, without frank pulmonary edema.  Mild cardiomegaly is unchanged.  No  definite pleural effusions. The patient is rotated to the left on today's exam, resulting in distortion of the mediastinal contours and reduced diagnostic sensitivity and specificity for mediastinal pathology.  IMPRESSION: 1.  Study is limited by leftward rotation of the patient.  There may be some lingular air space consolidation which could be concerning for infection.  However, this may alternatively be projectional.  Repeat properly centered a standing PA and lateral chest radiograph is recommended. 2.  Mild cardiomegaly with mild pulmonary venous congestion, but no frank pulmonary edema.   Original Report Authenticated By: Florencia Reasons, M.D.     Microbiology: Recent Results (from the past 240 hour(s))  URINE CULTURE     Status: Normal   Collection Time   12/12/11  1:27 AM      Component Value Range Status Comment   Specimen Description URINE, RANDOM   Final    Special Requests NONE   Final    Culture  Setup Time 12/12/2011 08:57   Final    Colony Count 3,000 COLONIES/ML   Final    Culture INSIGNIFICANT GROWTH   Final    Report Status 12/13/2011 FINAL   Final      Labs: Basic Metabolic Panel:  Lab 12/14/11 4782 12/13/11 1550 12/13/11 0830 12/13/11 0605 12/12/11 0212 12/11/11 2122 12/11/11 1426  NA 144 138 -- 141 141 -- 140  K 3.7 2.6* -- 2.3* 3.1* -- 2.3*  CL 102 95* -- 94* 93* -- 87*  CO2 37* 36* -- 38* 40* -- 42*  GLUCOSE 123* 169* -- 129* 137* -- 206*  BUN 27* 29* -- 30* 36* -- 36*  CREATININE 0.96 1.02 -- 0.96 1.17* -- 1.23*  CALCIUM 9.3 8.9 -- 8.8 8.9 -- 9.4  MG -- -- 2.4 -- -- 2.3 --  PHOS -- -- -- -- -- -- --    Liver Function Tests:  Lab 12/11/11 1912  AST 46*  ALT 22  ALKPHOS 200*  BILITOT 2.1*  PROT 7.4  ALBUMIN 3.4*   No results found for this basename: LIPASE:5,AMYLASE:5 in the last 168 hours  Lab 12/13/11 0605 12/11/11 1911  AMMONIA 44 164*   CBC:  Lab 12/14/11 0505 12/13/11 0605 12/11/11 1600  WBC 4.6 4.1 10.3  NEUTROABS -- -- --  HGB 14.1 12.6 16.1*  HCT 43.4 38.4 48.5*  MCV 88.6 86.7 87.5  PLT 82* 74* 125*   Cardiac Enzymes:  Lab 12/12/11 0817 12/12/11 0212 12/11/11 2123  CKTOTAL -- -- --  CKMB -- -- --  CKMBINDEX -- -- --  TROPONINI <0.30 <0.30 <0.30   BNP: BNP (last 3 results)  Basename 12/13/11 0830 12/11/11 1425 11/21/11 1733  PROBNP 197.8* 300.4* 128.2*   CBG:  Lab 12/14/11 0740 12/14/11 0401 12/14/11 0013 12/13/11 2003 12/13/11 1559  GLUCAP 120* 134* 140* 135* 174*    Time coordinating discharge: 35  minutes  Signed:  Benjamine Mola, Xan Ingraham  [facility] 12/14/2011, 10:59 AM

## 2011-12-15 ENCOUNTER — Telehealth: Payer: Self-pay | Admitting: *Deleted

## 2011-12-15 LAB — GLUCOSE, CAPILLARY
Glucose-Capillary: 127 mg/dL — ABNORMAL HIGH (ref 70–99)
Glucose-Capillary: 126 mg/dL — ABNORMAL HIGH (ref 70–99)

## 2011-12-15 NOTE — Telephone Encounter (Signed)
Received message that pt needs BMET drawn on Wednesday per after hours voicemail. I researched chart and labs usually drawn by pt's home health nurse.  I called and spoke with Julie--pt's home health nurse from [facility].  She will draw BMP on December 17, 2011. I gave order to continue weekly home health visits.

## 2011-12-16 ENCOUNTER — Telehealth: Payer: Self-pay | Admitting: Cardiovascular Disease

## 2011-12-16 ENCOUNTER — Encounter: Payer: Self-pay | Admitting: Cardiovascular Disease

## 2011-12-16 NOTE — Telephone Encounter (Signed)
Error

## 2011-12-18 ENCOUNTER — Ambulatory Visit: Payer: Medicare Other | Admitting: Cardiovascular Disease

## 2011-12-18 ENCOUNTER — Telehealth: Payer: Self-pay | Admitting: Cardiovascular Disease

## 2011-12-18 NOTE — Telephone Encounter (Signed)
SPOKE WITH DAUGHTER  LAST LABS   K+ WAS NORMAL AND KIDNEY FUNCTIONS WERE STABLE   AWAITING  REVIEW BY MD FOR ANY DIRECTION CHANGE IF NEEDED  WITH MEDS  HAS F/U WITH [facility] [place] [facility] ON 12-29-11

## 2011-12-18 NOTE — Telephone Encounter (Signed)
LABS RECHECKED ON 12-16-11./CY

## 2011-12-18 NOTE — Telephone Encounter (Signed)
Leave meds as they are.

## 2011-12-18 NOTE — Telephone Encounter (Signed)
Pt had lab work done Tuesday and they need to know if medication needs to adjusted or not

## 2011-12-19 NOTE — Telephone Encounter (Signed)
New Problem:    Called in because the patient has gained 8lbs since Monday, Vitals look normal and her lungs are clear.  Please call back and feel free to call patient or husband if she is not available.

## 2011-12-19 NOTE — Telephone Encounter (Signed)
Stanton Kidney, pt's daughter, states that Dr. Eden Emms told pt. and her family that the pt. will not need stess test which is scheduled for 10/29, PFT's scheduled on 11/1 or app. scheduled with Dr. Excell Seltzer on 11/1. She states that they were advised to keep hosp. f/u app. with Wende Mott, PA-C on 11/4. Please advise as pt. Is still scheduled for all of these appt's.  Also, Stanton Kidney states that pt. was advised at hosp d/c to increase Lasix to from 80 mg in am and 40 mg in late afternoon to 80 mg bid for weight gain of 2 lbs. Stanton Kidney states that her father has not been giving the pt. Lasix as directed when her weight started increasing early this week. Stanton Kidney states she will explain to her mother and father what to do in the future.

## 2011-12-19 NOTE — Telephone Encounter (Signed)
**Note De-Identified Annelie Boak Obfuscation** Pt's daughter, Stanton Kidney,  advised, she verbalized understanding.

## 2011-12-19 NOTE — Telephone Encounter (Signed)
Patient doesn't need stress test  F/U with Scott.  At some point her primary care doctor needs to take care of her issues and LE edema not [place] !!

## 2011-12-24 ENCOUNTER — Telehealth: Payer: Self-pay | Admitting: Cardiovascular Disease

## 2011-12-24 NOTE — Telephone Encounter (Signed)
Does pt need blood work today at the nurse visit

## 2011-12-24 NOTE — Telephone Encounter (Signed)
Alois Cliche RN with Heart failure program with [facility] needs to know what pt's last EF was you can contact her at [phone]

## 2011-12-24 NOTE — Telephone Encounter (Signed)
Called home hlth nurse Raynelle Fanning and told her no labs were needed per chart review. Also called nurse Sarah with [facility] and told her on last echo 10/25/11 EF was 60-65%.

## 2011-12-26 ENCOUNTER — Encounter (HOSPITAL_COMMUNITY): Payer: Medicare Other | Admitting: Cardiovascular Disease

## 2011-12-26 ENCOUNTER — Encounter (HOSPITAL_COMMUNITY): Payer: Medicare Other

## 2011-12-26 ENCOUNTER — Encounter (HOSPITAL_COMMUNITY): Payer: Medicare Other | Admitting: Thoracic Surgery (Cardiothoracic Vascular Surgery)

## 2011-12-29 ENCOUNTER — Encounter: Payer: Self-pay | Admitting: Physician Assistant

## 2011-12-29 ENCOUNTER — Ambulatory Visit (INDEPENDENT_AMBULATORY_CARE_PROVIDER_SITE_OTHER): Payer: Medicare Other | Admitting: Physician Assistant

## 2011-12-29 VITALS — BP 125/42 | HR 65 | Ht 60.0 in | Wt 247.0 lb

## 2011-12-29 DIAGNOSIS — R609 Edema, unspecified: Secondary | ICD-10-CM

## 2011-12-29 DIAGNOSIS — M48061 Spinal stenosis, lumbar region without neurogenic claudication: Secondary | ICD-10-CM

## 2011-12-29 DIAGNOSIS — I5032 Chronic diastolic (congestive) heart failure: Secondary | ICD-10-CM

## 2011-12-29 DIAGNOSIS — I359 Nonrheumatic aortic valve disorder, unspecified: Secondary | ICD-10-CM

## 2011-12-29 MED ORDER — FUROSEMIDE 80 MG PO TABS: ORAL_TABLET | ORAL | Status: DC

## 2011-12-29 NOTE — Progress Notes (Signed)
[address], [address] [place], [place]  [address] Phone: [phone], Fax:  [phone]  Date:  12/29/2011   Name:  [name]   DOB:  [birth year]   MRN:  [id number]  PCP:  [name], MD  Primary Cardiologist:  Dr. Charlton Haws  PV:  Dr. Tonny Bollman  Primary Electrophysiologist:  None    History of Present Illness: [name] is a 66 y.o. female who returns for follow up after recent admission to the hospital for altered mental status in the setting of dehydration, hypokalemia and elevated ammonia level.  She has a hx of COPD with previous VDRF in 2010, chronic thrombocytopenia, PVD, chronic diastolic CHF, RLE cellulitis, PAD, and severe aortic stenosis. Cardiac catheterization 12/10: Normal coronary arteries. She is not an operative or TAVR candidate for treatment of her AS due to co-morbidities.  She was admitted to [facility] 09/2011 with lower extremity numbness and pain secondary to significant lumbar stenosis. She was not felt to be a surgical candidate. Echocardiogram confirmed normal LVF and severe AS.  I have seen her several times in the office regarding her volume overload and LE edema. She had been in the emergency room on one occasion with evidence of edema on her chest x-ray. Metolazone was added to her diuretic regimen. She was then admitted 10/17-10/20. She presented with 1 week of progressive altered mental status and generalized weakness. Head CT was negative for acute findings. Potassium was significantly low at 2.3. Her ammonia level was also significantly elevated in the setting of severe constipation. Troponin was minimally elevated at 0.15. Subsequent troponins were negative. She was treated with lactulose for her NH4 which improved with bowel movement. She was also treated with IV fluids. She was seen by cardiology. Dr. Eden Emms evaluated her. She was not felt to have acute coronary syndrome. Overall, her LE edema is felt to be from chronic venous  stasis more than anything else.  Dr. Eden Emms recommended continued Lasix and Aldactone.  Since d/c, she noted some increase in her weight.  She increased her Lasix to 80 mg bid and Spironolactone to 25 mg bid for 2 days with 8 lb weight loss.  He edema is unchanged.  No chest pain, syncope, orthopnea, PND.    Labs (10/13):   K 2.3=> 3.7, creatinine 0.96, ALT 22, ammonia 164=> 44, Hgb 14.1, BNP 198, TSH 0.153, FT4 1.90  Wt Readings from Last 3 Encounters:  12/29/11 247 lb (112.038 kg)  12/14/11 230 lb 6.1 oz (104.5 kg)  12/01/11 251 lb (113.853 kg)     Past Medical History  Diagnosis Date  . Carotid bruit     0-39% bilateral ICA by dopplers 03/2010  . Aortic stenosis     a. Moderate echo 01/2009, then severe 03/2010 --> last echo 09/2011:  EF 60-65%, grade 2 diastolic dysfunction, severe aortic stenosis, mean gradient 50, mild MR    b. Not an operative candidate due to comorbidities, not a candidate for TAVR given PVD. c. low risk myovue 11/2007 brest attenuation EF  63%  . Venous insufficiency   . Arthritis   . Depression   . COPD (chronic obstructive pulmonary disease)   . Hypothyroidism   . Thrombocytopenia   . Hypokalemia   . Edema   . Respiratory failure     Ventilator-dependent respiratory failure secondary to hypercarbia, cardiac arrest, presumed penumonia 12/2008  . Cardiac arrest     12/2010 in setting of respiratory failure with less than 15 minutes  of CPR; initial rhythm was asystole  . PVD (peripheral vascular disease)     Eval by Dr. Excell Seltzer 2012: noninvasive immaging suggested significant aortoiliac disease, ABIs in mild range  - med rx given significant comorbidities  . H/O endoscopy     For ?ampullary mass - had EGD 07/2010 with no evidence of ampullary mass, some CBD dilitation   . Chronic pain   . Lumbar stenosis     Admitted 09/2011 - not felt to be a good candidate for surgical intervention.  . CHF (congestive heart failure)     Current Outpatient Prescriptions    Medication Sig Dispense Refill  . aspirin 325 MG tablet Take 1 tablet (325 mg total) by mouth daily.      . famotidine (PEPCID) 20 MG tablet Take 1 tablet (20 mg total) by mouth daily. For indigestion  30 tablet  1  . FLUoxetine (PROZAC) 20 MG capsule Take 20 mg by mouth daily.        . furosemide (LASIX) 80 MG tablet Take 2 tab bid      . lactulose (CHRONULAC) 10 GM/15ML solution Take 15 mLs (10 g total) by mouth 2 (two) times daily as needed (constipation).  240 mL  0  . levothyroxine (SYNTHROID, LEVOTHROID) 137 MCG tablet Take 137 mcg by mouth daily.      . metoprolol succinate (TOPROL-XL) 25 MG 24 hr tablet Take 25 mg by mouth daily.      Marland Kitchen morphine (MS CONTIN) 60 MG 12 hr tablet Take 120 mg by mouth 2 (two) times daily.      Marland Kitchen morphine (MSIR) 15 MG tablet Take 15 mg by mouth every 4 (four) hours as needed. pain      . potassium chloride (K-DUR,KLOR-CON) 10 MEQ tablet Take 10 mEq by mouth 2 (two) times daily.      . SENNA PO Take by mouth 2 (two) times daily. Take 4 tab bid      . senna-docusate (SENOKOT-S) 8.6-50 MG per tablet Take 4 tablets by mouth 2 (two) times daily. For constipation.      Marland Kitchen spironolactone (ALDACTONE) 25 MG tablet Take one tab twice a day and may increase to two tab bid as needed      . [DISCONTINUED] spironolactone (ALDACTONE) 25 MG tablet Take 1 tablet (25 mg total) by mouth daily.  30 tablet  0    Allergies: Allergies  Allergen Reactions  . Latex     REACTION: itch  . Penicillins     REACTION: hives    Social History:  The patient  reports that she has never smoked. She uses smokeless tobacco. She reports that she does not drink alcohol or use illicit drugs.   ROS:  Please see the history of present illness.   All other systems reviewed and negative.   PHYSICAL EXAM: VS:  BP 125/42  Pulse 65  Ht 5' (1.524 m)  Wt 247 lb (112.038 kg)  BMI 48.24 kg/m2 Well nourished, well developed, in no acute distress HEENT: normal Neck: no JVD Cardiac:  normal S1,  S2; RRR; 2/6 systolic murmur at RUSB Lungs:  clear to auscultation bilaterally, no wheezing, rhonchi or rales Abd: soft, nontender, no hepatomegaly Ext: brawny 3+ bilateral LE edema Skin: warm and dry Neuro:  CNs 2-12 intact, no focal abnormalities noted  EKG:  NSR, HR 65, LAD, LVH, no ischemic changes     ASSESSMENT AND PLAN:  1. Chronic Diastolic CHF:   She has issues with volume overload  in the setting of severe AS.  She also has significant LE edema related to venous insufficiency.  She had good diuresis with adjusting her medications at home.  I have recommended she resume Lasix 80A/40P and Spironolactone to 25 mg QD.  Check BMET today.  Follow up as planned with Dr. Charlton Haws later this month.  2. Edema:   As noted, she has severe venous insufficiency.  Diuretic management as noted.  She has been advised to wear compression stockings every day and to keep her legs elevated as much as possible.   3. Aortic Stenosis:   She is not an operative candidate.  Her family asked again today if she was a candidate for surgery.  I reviewed this again with them.  4. Lumbar Stenosis:   Chronic narcotic pain medication use is likely contributing to her constipation.  This is managed by her PCP.     Luna Glasgow, PA-C  4:27 PM 12/29/2011

## 2011-12-29 NOTE — Patient Instructions (Addendum)
WEAR YOUR COMPRESSION STOCKINGS EVERY DAY FROM THE TIME YOU GET UP IN THE MORNING UNTIL YOU GO TO BED  LAB TODAY; BMET  RESUME LASIX 80 MG IN THE MORNING AND 40 MG IN THE AFTERNOON  WEIGH DAILY AND IF WEIGHT IS INCREASED > 5LB'S IN 1 WEEK OK TO INCREASE LASIX TO 80 MG TWICE DAILY AND SPIRONOLACTONE 25 MG TWICE DAILY ONLY FOR 2 DAYS AND CALL [facility], [place] [phone]  KEEP APPT WITH DR. Eden Emms 01/20/12

## 2011-12-30 LAB — BASIC METABOLIC PANEL
BUN: 11 mg/dL (ref 6–23)
Calcium: 8.1 mg/dL — ABNORMAL LOW (ref 8.4–10.5)
Creatinine, Ser: 0.8 mg/dL (ref 0.4–1.2)

## 2012-01-01 ENCOUNTER — Telehealth: Payer: Self-pay | Admitting: *Deleted

## 2012-01-01 NOTE — Telephone Encounter (Signed)
Message copied by Tarri Fuller on Thu Jan 01, 2012 10:42 AM ------      Message from: [place], [place] T      Created: Wed Dec 31, 2011  5:20 PM       Take K+ 40 mEq extra x 1.      Then next day, increase daily K+ to 20 mEq bid.       Repeat BMET in one week.      Tereso Newcomer, PA-C  5:19 PM 12/31/2011

## 2012-01-01 NOTE — Telephone Encounter (Signed)
Message copied by Tarri Fuller on Thu Jan 01, 2012 10:00 AM ------      Message from: [place], [place] T      Created: Wed Dec 31, 2011  5:20 PM       Take K+ 40 mEq extra x 1.      Then next day, increase daily K+ to 20 mEq bid.       Repeat BMET in one week.      Tereso Newcomer, PA-C  5:19 PM 12/31/2011

## 2012-01-01 NOTE — Telephone Encounter (Signed)
s/w HHRN (Becky) bmet 1 week  11/14 with results faxed to [facility]. PAC

## 2012-01-01 NOTE — Telephone Encounter (Signed)
Message copied by Tarri Fuller on Thu Jan 01, 2012 11:23 AM ------      Message from: Nadine, [place] T      Created: Wed Dec 31, 2011  5:20 PM       Take K+ 40 mEq extra x 1.      Then next day, increase daily K+ to 20 mEq bid.       Repeat BMET in one week.      Tereso Newcomer, PA-C  5:19 PM 12/31/2011

## 2012-01-01 NOTE — Telephone Encounter (Signed)
pt notified about lab results and dose change w/K+ and bmet with HHRN in 1 wk

## 2012-01-08 ENCOUNTER — Encounter: Payer: Self-pay | Admitting: Cardiovascular Disease

## 2012-01-12 ENCOUNTER — Telehealth: Payer: Self-pay | Admitting: Cardiovascular Disease

## 2012-01-12 NOTE — Telephone Encounter (Signed)
Pt daughter Stanton Kidney [phone] returning nurse call

## 2012-01-12 NOTE — Telephone Encounter (Signed)
New Problem:    Called in because they are out of orders and wanted to know if Dr. Eden Emms still wanted her to be seen or if he wanted her to be discharged. Please call back.

## 2012-01-13 NOTE — Telephone Encounter (Signed)
PT'S DAUGHTER AWARE LAST LABS WERE OKAY PER DR NISHAN./CY

## 2012-01-14 ENCOUNTER — Telehealth: Payer: Self-pay | Admitting: Cardiovascular Disease

## 2012-01-16 ENCOUNTER — Ambulatory Visit: Payer: Medicare Other | Admitting: Physician Assistant

## 2012-01-16 NOTE — Telephone Encounter (Signed)
F/u   Raynelle Fanning- [facility] [phone] has been trying to contact Dr. Eden Emms for pt orders. plz return call

## 2012-01-16 NOTE — Telephone Encounter (Signed)
lmtcb

## 2012-01-16 NOTE — Telephone Encounter (Signed)
[facility] will be discharging pt. She has tele monitor system through [facility], pt getting daily wts,  pt stable,

## 2012-01-16 NOTE — Telephone Encounter (Signed)
F/u   Raynelle Fanning- [facility] [phone], returning nurse Jodette Call

## 2012-01-20 ENCOUNTER — Ambulatory Visit: Payer: Medicare Other | Admitting: Cardiovascular Disease

## 2012-02-05 ENCOUNTER — Ambulatory Visit (INDEPENDENT_AMBULATORY_CARE_PROVIDER_SITE_OTHER): Payer: Medicare Other | Admitting: Cardiovascular Disease

## 2012-02-05 ENCOUNTER — Encounter: Payer: Self-pay | Admitting: Cardiovascular Disease

## 2012-02-05 VITALS — BP 114/51 | HR 72 | Wt 247.0 lb

## 2012-02-05 DIAGNOSIS — I350 Nonrheumatic aortic (valve) stenosis: Secondary | ICD-10-CM

## 2012-02-05 DIAGNOSIS — I359 Nonrheumatic aortic valve disorder, unspecified: Secondary | ICD-10-CM

## 2012-02-05 DIAGNOSIS — J4489 Other specified chronic obstructive pulmonary disease: Secondary | ICD-10-CM

## 2012-02-05 DIAGNOSIS — J449 Chronic obstructive pulmonary disease, unspecified: Secondary | ICD-10-CM

## 2012-02-05 NOTE — Patient Instructions (Signed)
Your physician wants you to follow-up in:   6 MONTHS WITH DR Eden Emms   ALSO NEEDS F/U WITH DR COPPER IN [facility]  NEXT AVAILABLE   You will receive a reminder letter in the mail two months in advance. If you don't receive a letter, please call our office to schedule the follow-up appointment. You have been referred to TO SEE DR CLANCE  AT 3:00 PM  ON 02-27-12

## 2012-02-05 NOTE — Assessment & Plan Note (Addendum)
She actually looks much better today.  She will likely be in the hospital often if we dont try to do something "heroic"  Will have her see Dr Excell Seltzer in TAVR clinic.  I am skeptical that she will end up being a candidate  Last ABI;'s 5/12 with monophasic wave forms suggesting inflow/iliac disease and ABI's were .7 and .87 at rest/  The fact that she would likely need to be approached apically for TAVR is also concerning

## 2012-02-05 NOTE — Progress Notes (Signed)
Patient ID: [name], female   DOB: 06/04/1945, 66 y.o.   MRN: [id number] She has a hx of COPD with previous VDRF in 2010, chronic thrombocytopenia, PVD, chronic diastolic CHF, RLE cellulitis, PAD, and severe aortic stenosis. Cardiac catheterization 12/10: Normal coronary arteries. She is not an operative or TAVR candidate for treatment of her AS due to co-morbidities. She was admitted to [facility] 09/2011 with lower extremity numbness and pain secondary to significant lumbar stenosis. She was not felt to be a surgical candidate. Echocardiogram confirmed normal LVF and severe AS. I have seen her several times in the office regarding her volume overload and LE edema. She had been in the emergency room on one occasion with evidence of edema on her chest x-ray. Metolazone was added to her diuretic regimen. She was then admitted 10/17-10/20. She presented with 1 week of progressive altered mental status and generalized weakness. Head CT was negative for acute findings. Potassium was significantly low at 2.3. Her ammonia level was also significantly elevated in the setting of severe constipation. Troponin was minimally elevated at 0.15. Subsequent troponins were negative. She was treated with lactulose for her NH4 which improved with bowel movement. She was also treated with IV fluids. She was seen by cardiology. Dr. Eden Emms evaluated her. She was not felt to have acute coronary syndrome. Overall, her LE edema is felt to be from chronic venous stasis more than anything else. Dr. Eden Emms recommended continued Lasix and Aldactone. Since d/c, she noted some increase in her weight. She increased her Lasix to 80 mg bid and Spironolactone to 25 mg bid for 2 days with 8 lb weight loss. He edema is unchanged. No chest pain, syncope, orthopnea, PND.   Last echo: 10/25/11 Study Conclusions  - Left ventricle: The cavity size was normal. Systolic function was normal. The estimated ejection fraction was in the range of 60% to 65%.  Wall motion was normal; there were no regional wall motion abnormalities. Features are consistent with a pseudonormal left ventricular filling pattern, with concomitant abnormal relaxation and increased filling pressure (grade 2 diastolic dysfunction). Doppler parameters are consistent with elevated ventricular end-diastolic filling pressure. - Aortic valve: There was severe stenosis. Mean gradient: 50mm Hg (S). Peak gradient: 91mm Hg (S). Valve area: 0.94cm^2(VTI). Valve area: 0.77cm^2 (Vmax). - Mitral valve: Leaflet separation was reduced. Mobility was restricted. Mild regurgitation. Mean gradient: 6mm Hg (D). Valve area by pressure half-time: 2.12cm^2. Valve area by continuity equation (using LVOT flow): 1.45cm^2. - Left atrium: The atrium was moderately dilated. Transthoracic echocardiography. M-mode, complete 2D, spectral Doppler, and color Doppler. Height: Height: 152.4cm. Height: 60in. Weight: Weight: 120.7kg. Weight: 265.4lb. Body mass index: BMI: 51.9kg/m^2. Body surface area: BSA: 2.70m^2. Blood pressure: 166/64. Patient status: Inpatient. Location: Bedside.  12/29/11 K 3.2 Cr .9   She stopped smoking in May but was sent home on oxgyen and has clinical sleep apnea.  In the hospital she had C02 retention with contraction alkalosis She wants a motorized wheel chair.  She always has LE edema from venous insuf.  Cannot really walk due to lumbar stenosis and right knee osteoarthritis And is not a surgical candidate for TKR  ROS: Denies fever, malais, weight loss, blurry vision, decreased visual acuity, cough, sputum, SOB, hemoptysis, pleuritic pain, palpitaitons, heartburn, abdominal pain, melena, lower extremity edema, claudication, or rash.  All other systems reviewed and negative  General: Affect appropriate Obese chronically ill white female HEENT: normal Neck supple with no adenopathy JVP normal no bruits no thyromegaly Lungs clear  with no wheezing and good diaphragmatic  motion Heart:  S1/S2 muffled with severe AS murmur, no rub, gallop or click PMI normal Abdomen: benighn, BS positve, no tenderness, no AAA no bruit.  No HSM or HJR Distal pulses intact with no bruits Plus 2 bilateral  edema Neuro non-focal Skin warm and dry LE leg weakness   Current Outpatient Prescriptions  Medication Sig Dispense Refill  . aspirin 325 MG tablet Take 1 tablet (325 mg total) by mouth daily.      . famotidine (PEPCID) 20 MG tablet Take 1 tablet (20 mg total) by mouth daily. For indigestion  30 tablet  1  . FLUoxetine (PROZAC) 20 MG capsule Take 20 mg by mouth daily.        . furosemide (LASIX) 80 MG tablet TAKE 80 AM AND 80 PM      . lactulose (CHRONULAC) 10 GM/15ML solution Take 15 mLs (10 g total) by mouth 2 (two) times daily as needed (constipation).  240 mL  0  . levothyroxine (SYNTHROID, LEVOTHROID) 137 MCG tablet Take 137 mcg by mouth daily.      . metoprolol succinate (TOPROL-XL) 25 MG 24 hr tablet Take 25 mg by mouth daily.      Marland Kitchen morphine (MS CONTIN) 60 MG 12 hr tablet Take 120 mg by mouth 2 (two) times daily.      Marland Kitchen morphine (MSIR) 15 MG tablet Take 15 mg by mouth every 4 (four) hours as needed. pain      . potassium chloride (K-DUR,KLOR-CON) 10 MEQ tablet Take 10 mEq by mouth 2 (two) times daily.      Marland Kitchen senna-docusate (SENOKOT-S) 8.6-50 MG per tablet Take 4 tablets by mouth daily. For constipation.      Marland Kitchen spironolactone (ALDACTONE) 25 MG tablet Take one tab twice a day and may increase to two tab bid as needed      . [DISCONTINUED] furosemide (LASIX) 80 MG tablet TAKE 80 AM AND 40 PM        Allergies  Latex and Penicillins  Electrocardiogram:  Assessment and Plan

## 2012-02-05 NOTE — Assessment & Plan Note (Signed)
Continue current dose of Lasix and aldactone  No venous skin break down.

## 2012-02-05 NOTE — Assessment & Plan Note (Signed)
F/U with Dr Shelle Iron needs closer monitoring of disease and ? PFT;s to gauge if she is a TAVR candidate.  Would benefit from oxygen prescription and CPAP

## 2012-02-11 ENCOUNTER — Other Ambulatory Visit: Payer: Self-pay | Admitting: *Deleted

## 2012-02-11 DIAGNOSIS — I359 Nonrheumatic aortic valve disorder, unspecified: Secondary | ICD-10-CM

## 2012-02-13 ENCOUNTER — Telehealth: Payer: Self-pay | Admitting: *Deleted

## 2012-02-13 NOTE — Telephone Encounter (Signed)
Left VM with patient about upcoming TAVR appt with Dr. Cornelius Moras and Dr. Excell Seltzer on Friday 03/05/12.  Will continue to follow up.

## 2012-02-27 ENCOUNTER — Ambulatory Visit (INDEPENDENT_AMBULATORY_CARE_PROVIDER_SITE_OTHER): Payer: Medicare Other | Admitting: Pulmonary Disease

## 2012-02-27 ENCOUNTER — Encounter: Payer: Self-pay | Admitting: Pulmonary Disease

## 2012-02-27 VITALS — BP 106/66 | HR 74 | Temp 98.2°F | Ht 60.0 in | Wt 244.0 lb

## 2012-02-27 DIAGNOSIS — J449 Chronic obstructive pulmonary disease, unspecified: Secondary | ICD-10-CM

## 2012-02-27 NOTE — Assessment & Plan Note (Signed)
The patient had minimal COPD by PFTs in 2011, primarily manifested as air trapping.  I suspect there has been very little change since that time, but will do followup PFTs for preop evaluation.  I have asked her to stay off cigarettes completely, and to work on getting off the electronic cigarettes as well.  I will also check overnight oximetry to see if she will benefit from nocturnal oxygen.  It is unclear whether she has obstructive sleep apnea or not, but can make this assessment with a sleep study after her surgical procedure.  I have stressed the importance of aggressive weight loss with her.

## 2012-02-27 NOTE — Patient Instructions (Addendum)
Will check breathing tests early next week, and call you with results. Will arrange to have your oxygen level checked one night next week. Stay off cigarettes.  You are doing well.  Will be able to assess your risks for the procedure a little more once I see test results.

## 2012-02-27 NOTE — Progress Notes (Signed)
  Subjective:    Patient ID: [name], female    DOB: Dec 11, 1945, 67 y.o.   MRN: [id number]  HPI The patient comes in today for pulmonary followup.  She has not been seen in almost 3 years, and despite her long-standing smoking history, only has minimal airflow obstruction on PFTs in 2011.  She was tried on Spiriva at that time, however discontinued because she did not appreciate any improvement in her breathing.  She and her husband both have quit using cigarettes, and are using an electronic cigarette.  She has known aortic valve disease, and is being considered for noninvasive surgical treatment.  The patient continues to have significant dyspnea on exertion, however is morbidly obese.  Her oxygen saturations are adequate at rest on room air, and she has oxygen at home but does not use it at night while sleeping.  She denies any issues with chest congestion, cough, or mucus production.  The question is been raised whether she may have obstructive sleep apnea.  She does have an elevated serum bicarbonate level, but has more of a contraction alkalosis by blood work.  The patient does have snoring as well as occasional abnormal breathing pattern during sleep, but the husband does not feel it is significant.  She admits to having restless sleep, but believes this is secondary to chronic pain.  She is not rested in the mornings upon arising, and has sleepiness during the day with inactivity.  However, she does not sleep well, and takes chronic pain medication during the day as well.   Review of Systems  Constitutional: Negative for fever and unexpected weight change.  HENT: Positive for rhinorrhea. Negative for ear pain, nosebleeds, congestion, sore throat, sneezing, trouble swallowing, dental problem, postnasal drip and sinus pressure.   Eyes: Negative for redness and itching.  Respiratory: Negative for cough, chest tightness, shortness of breath and wheezing.   Cardiovascular: Positive for leg  swelling. Negative for palpitations.  Gastrointestinal: Negative for nausea and vomiting.  Genitourinary: Negative for dysuria.  Musculoskeletal: Negative for joint swelling.  Skin: Negative for rash.  Neurological: Negative for headaches.  Hematological: Does not bruise/bleed easily.  Psychiatric/Behavioral: Negative for dysphoric mood. The patient is not nervous/anxious.        Objective:   Physical Exam Morbidly obese female in no acute distress Nose without purulence or discharge noted Oropharynx clear Neck without lymphadenopathy or thyromegaly Chest with minimal basilar crackles, adequate air flow, no wheezing Cardiac exam with regular rate and rhythm, blowing systolic murmur. Lower extremities with edema noted, no cyanosis Alert and oriented, moves all 4 extremities.       Assessment & Plan:

## 2012-03-01 ENCOUNTER — Ambulatory Visit (INDEPENDENT_AMBULATORY_CARE_PROVIDER_SITE_OTHER): Payer: Medicare Other | Admitting: Pulmonary Disease

## 2012-03-01 ENCOUNTER — Telehealth: Payer: Self-pay | Admitting: Pulmonary Disease

## 2012-03-01 DIAGNOSIS — J9610 Chronic respiratory failure, unspecified whether with hypoxia or hypercapnia: Secondary | ICD-10-CM

## 2012-03-01 LAB — PULMONARY FUNCTION TEST

## 2012-03-01 NOTE — Telephone Encounter (Signed)
So she is going to sit in the same spot and not move for 24hrs???? Let her know we usually do not do this.  I am concerned about her oxygen level while sleeping, not while awake (we can measure this in the office).

## 2012-03-01 NOTE — Telephone Encounter (Signed)
Spoke with Mayra Reel from [place] states that patient is requesting for her o2 levels to be checked over a 24hr time frame because she is not sleeping but approx. 2 hrs per night. Patient already has ONO ordered (for surgical clearance).  Dr. Shelle Iron please advise, thank you

## 2012-03-01 NOTE — Progress Notes (Signed)
PFT done today. 

## 2012-03-02 ENCOUNTER — Telehealth: Payer: Self-pay | Admitting: *Deleted

## 2012-03-02 NOTE — Telephone Encounter (Signed)
I spoke with the pt and advised that ONO needs to be done at night. Pt states understanding and I have notified Jermaine at [facility] to go ahead and setup ono. Carron Curie, CMA

## 2012-03-02 NOTE — Telephone Encounter (Signed)
Error

## 2012-03-05 ENCOUNTER — Encounter (HOSPITAL_COMMUNITY): Payer: Self-pay | Admitting: Thoracic Surgery (Cardiothoracic Vascular Surgery)

## 2012-03-05 ENCOUNTER — Ambulatory Visit (HOSPITAL_BASED_OUTPATIENT_CLINIC_OR_DEPARTMENT_OTHER)
Admission: RE | Admit: 2012-03-05 | Discharge: 2012-03-05 | Disposition: A | Discharge status: Home / Self Care | Payer: Medicare Other | Source: Ambulatory Visit | Attending: Thoracic Surgery (Cardiothoracic Vascular Surgery) | Admitting: Thoracic Surgery (Cardiothoracic Vascular Surgery)

## 2012-03-05 ENCOUNTER — Ambulatory Visit (HOSPITAL_COMMUNITY): Payer: Medicare Other

## 2012-03-05 ENCOUNTER — Ambulatory Visit (HOSPITAL_COMMUNITY)
Admission: RE | Admit: 2012-03-05 | Discharge: 2012-03-05 | Disposition: A | Discharge status: Home / Self Care | Payer: Medicare Other | Source: Ambulatory Visit | Attending: Cardiovascular Disease | Admitting: Cardiovascular Disease

## 2012-03-05 ENCOUNTER — Encounter (HOSPITAL_COMMUNITY): Payer: Medicare Other

## 2012-03-05 ENCOUNTER — Encounter (HOSPITAL_COMMUNITY): Payer: Self-pay | Admitting: Cardiovascular Disease

## 2012-03-05 VITALS — BP 108/89 | HR 87 | Resp 20 | Ht 60.0 in | Wt 242.0 lb

## 2012-03-05 VITALS — BP 108/89 | HR 87 | Resp 18 | Ht 60.0 in | Wt 242.0 lb

## 2012-03-05 DIAGNOSIS — I359 Nonrheumatic aortic valve disorder, unspecified: Secondary | ICD-10-CM

## 2012-03-05 HISTORY — DX: Spinal stenosis, lumbar region without neurogenic claudication: M48.061

## 2012-03-05 HISTORY — DX: Chronic diastolic (congestive) heart failure: I50.32

## 2012-03-05 HISTORY — DX: Morbid (severe) obesity due to excess calories: E66.01

## 2012-03-05 HISTORY — DX: Other symptoms and signs involving the musculoskeletal system: R29.898

## 2012-03-05 NOTE — Progress Notes (Signed)
[facility]  MULTIDISCIPLINARY HEART VALVE CLINIC    CARDIOTHORACIC SURGERY CONSULTATION REPORT  Referring Provider is [name] Stade, MD PCP is [name] Mask, MD  Chief Complaint  Patient presents with  . Aortic Stenosis    eval for TAVR, severe AS    HPI:  Patient is a 67 year old morbidly obese married white female followed by Dr. Eden [name] for several years with known history of aortic stenosis with chronic diastolic congestive heart failure and chronic shortness of breath.  Her congestive heart failure and shortness of breath or felt to be multifactorial related to a combination of aortic stenosis, long-standing hypertension, chronic obstructive pulmonary disease, and morbid obesity.  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 She's had long-standing history of arthritis with chronic pain in her back and legs for many years. This has continued to progress, and over the past 3 years the patient has been essentially nonambulatory. She spent the majority of her time in a wheelchair and now can only ambulate very short distances using a walker.  During this time she has continued to gain weight.  She was hospitalized last summer with severe back pain and lower extremity numbness.  MRI of the spine at that time demonstrated further progression of degenerative disease with severe lumbar spinal stenosis. She was seen in consultation by Dr. Jule [name] from the neurosurgical team and discussions were held at that time regarding possible surgical intervention. However, she was felt to be poor candidate for surgery because of her underlying severe aortic stenosis. She was initially discharged to rehabilitation and finally made at home, but was hospitalized again in October 2013 with altered mental status  and generalized weakness associated with severe constipation and secondary acute metabolic encephalopathy.  Since then she has remained clinically stable as an outpatient, but she has been referred to the multidisciplinary heart valve clinic to consider transcatheter aortic valve replacement.  The patient's history of aortic stenosis his back several years. Most recent echocardiogram performed in August 2013 demonstrates peak and mean transvalvular gradient of 91 and 55 mm mercury respectively. Left ventricular systolic function has remained well preserved. The patient has stable symptoms of chronic exertional shortness of breath but denies resting shortness of breath, dizzy spells, or syncope. She has occasional mild tightness across her chest but this does not seem to be related to physical exertion. She has severe chronic bilateral lower extremity edema with known history of severe chronic venous insufficiency complicated by skin breakdown and chronic ulcerations.   Past Medical History  Diagnosis Date  . Carotid bruit     0-39% bilateral ICA by dopplers 03/2010  . Aortic stenosis     a. Moderate echo 01/2009, then severe 03/2010 --> last echo 09/2011:  EF 60-65%, grade 2 diastolic dysfunction, severe aortic stenosis, mean gradient 50, mild MR    b. Not an operative candidate due to comorbidities, not a candidate for TAVR given PVD. c. low risk myovue 11/2007 brest attenuation EF  63%  . Venous insufficiency   . Arthritis   . Depression   . COPD (chronic obstructive pulmonary disease)   . Hypothyroidism   . Thrombocytopenia   . Hypokalemia   . Edema   . Respiratory failure     Ventilator-dependent respiratory failure secondary to hypercarbia, cardiac arrest, presumed penumonia 12/2008  .  Cardiac arrest     12/2010 in setting of respiratory failure with less than 15 minutes of CPR; initial rhythm was asystole  . PVD (peripheral vascular disease)     Eval by Dr. Excell [name] 2012: noninvasive  immaging suggested significant aortoiliac disease, ABIs in mild range  - med rx given significant comorbidities  . H/O endoscopy     For ?ampullary mass - had EGD 07/2010 with no evidence of ampullary mass, some CBD dilitation   . Chronic pain   . Lumbar stenosis     Admitted 09/2011 - not felt to be a good candidate for surgical intervention.  . CHF (congestive heart failure)   . Morbid obesity 10/27/2011  . Chronic diastolic CHF (congestive heart failure) 10/27/2011  . Lower extremity weakness 10/26/2011  . Spinal stenosis of lumbar region 10/25/2011  . ARTHRITIS 02/06/2009    Qualifier: Diagnosis of  By: [name]    . COPD 02/06/2009    Qualifier: Diagnosis of  By: [name]    . VENOUS INSUFFICIENCY 02/06/2009    Qualifier: Diagnosis of  By: [name]      Past Surgical History  Procedure Date  . Thyroidectomy   . Laparoscopic cholecystectomy   . Lithotripsy      for nephrolithiasis  . Vaginal hysterectomy      for menorrhagia  at age 50  . Breast cyst excision   . Rotator cuff repair   . Cesarean section   . Laminectomy     Family History  Problem Relation Age of Onset  . Emphysema Father   . Allergies Sister   . Asthma Sister   . Heart disease Father   . Cancer Mother     History   Social History  . Marital Status: Married    Spouse Name: N/A    Number of Children: N/A  . Years of Education: N/A   Occupational History  . Not on file.   Social History Main Topics  . Smoking status: Current Every Day Smoker -- 2.0 packs/day for 45 years    Types: Cigarettes  . Smokeless tobacco: Current User     Comment: PT USES [employer]AP[employer]AR[employer]  . Alcohol Use: No  . Drug Use: No  . Sexually Active: Not on file   Other Topics Concern  . Not on file   Social History Narrative   pt is married.pt has children.pt is a housewife. Pt essentially non-ambulatory at least 2-3 years    Current Outpatient Prescriptions  Medication Sig Dispense Refill  .  ALPRAZolam (XANAX) 1 MG tablet       . aspirin 325 MG tablet Take 1 tablet (325 mg total) by mouth daily.      . famotidine (PEPCID) 20 MG tablet Take 1 tablet (20 mg total) by mouth daily. For indigestion  30 tablet  1  . FLUoxetine (PROZAC) 20 MG capsule Take 20 mg by mouth daily.        . furosemide (LASIX) 80 MG tablet TAKE 80 AM AND 80 PM      . lactulose (CHRONULAC) 10 GM/15ML solution Take 15 mLs (10 g total) by mouth 2 (two) times daily as needed (constipation).  240 mL  0  . levothyroxine (SYNTHROID, LEVOTHROID) 137 MCG tablet Take 137 mcg by mouth daily.      . metoprolol succinate (TOPROL-XL) 25 MG 24 hr tablet Take 25 mg by mouth daily.      Marland Kitchen morphine (MS CONTIN) 60 MG 12 hr tablet Take  120 mg by mouth 2 (two) times daily.      Marland Kitchen morphine (MSIR) 15 MG tablet Take 15 mg by mouth every 4 (four) hours as needed. pain      . potassium chloride (K-DUR,KLOR-CON) 10 MEQ tablet Take 10 mEq by mouth 2 (two) times daily.      Marland Kitchen senna-docusate (SENOKOT-S) 8.6-50 MG per tablet Take 4 tablets by mouth daily. For constipation.      Marland Kitchen spironolactone (ALDACTONE) 25 MG tablet Take one tab twice a day and may increase to two tab bid as needed        Allergies  Allergen Reactions  . Latex     REACTION: itch  . Penicillins     REACTION: hives    Review of Systems:              General:                      NO decreased appetite, NO decreased  energy, YES weight gain, NO weight loss, NO fever             Cardiac:                      YES chest pain with exertion, NO chest pain at rest, YES SOB with exertion, YES resting SOB, NO PND, NO orthopnea, YES palpitations, NO arrhythmia, NO atrial fibrillation, YES LE edema, NO dizzy spells, NO syncope             Respiratory:                YES shortness of breath, YES home oxygen, NO productive cough, NO dry cough, NO bronchitis, NO wheezing, NO hemoptysis, NO asthma, NO pain with inspiration or cough, NO sleep apnea, NO CPAP at night             GI:                                 NO difficulty swallowing, YES reflux, YES frequent heartburn, NO hiatal hernia, YES abdominal pain, YES constipation, NO diarrhea, NO hematochezia, NO hematemesis, NO melena             GU:                              YES dysuria,  YES frequency, NO urinary tract infection, NO hematuria, NO enlarged prostate, YES kidney stones, NO kidney disease             Vascular:                     YES pain suggestive of claudication, YES pain in feet, YES leg cramps, NO varicose veins,  DVT, NO non-healing foot ulcer             Neuro:                         YES stroke, NO TIA's, NO seizures, NO headaches, NO temporary blindness one eye,  NO slurred speech, NO peripheral neuropathy, YES chronic pain, NO instability of gait, NO memory/cognitive dysfunction             Musculoskeletal:         YES arthritis, YES joint swelling, YES myalgias, YES difficulty walking, YES difficult  mobility, patient stays in wheelchair most of the time and can only ambulate very short distances using a walker             Skin:                            YES rash, YES itching, NO skin infections, YES pressure sores or ulcerations             Psych:                         NO anxiety, YES depression, YES nervousness, NO unusual recent stress             Eyes:                           YES blurry vision, NO floaters, NO recent vision changes, YES wears glasses or contacts             ENT:                            NO hearing loss, NO loose or painful teeth, YES dentures, last saw dentist DEC 2013             Hematologic:               NO easy bruising, NO abnormal bleeding, NOclotting disorder, NO frequent epistaxis             Endocrine:                   NO diabetes, does not check CBG's at home N/A                   Physical Exam:   BP 108/89  Pulse 87  Resp 18  Ht 5' (1.524 m)  Wt 242 lb (109.77 kg)  BMI 47.26 kg/m2  SpO2 97%  General:  Morbidly obese and chronically  ill-appearing  HEENT:  Unremarkable   Neck:   no JVD, no bruits, no adenopathy   Chest:   clear to auscultation, symmetrical breath sounds, no wheezes, no rhonchi   CV:   RRR, grade III/VI crescendo/decrescendo murmur heard best at RUSB,  no diastolic murmur  Abdomen:  Morbidly obese but soft, non-tender, no masses   Extremities:  warm, well-perfused, pulses not palpable, severe bilateral LE edema with chronic venous stasis changes, no active ulcerations  Rectal/GU  Deferred  Neuro:   Grossly non-focal and symmetrical throughout  Skin:   Clean and dry, no rashes, no breakdown   Diagnostic Tests:  Transthoracic Echocardiography  Patient:    [name], [name] MR #:       [id number] Study Date: 10/25/2011 Gender:     F Age:        66 Height:     152.4cm Weight:     120.7kg BSA:        2.33m^2 Pt. Status: Room:       WA24    PERFORMING   [facility]  ATTENDING    Philip Aspen, Estela  ADMITTING    [place], [place]  ORDERING     [place], [place]  SONOGRAPHER  [place], RDCS, CCT cc:  ------------------------------------------------------------ LV EF: 60% -   65%  ------------------------------------------------------------ Indications:      Aortic stenosis 424.1.  ------------------------------------------------------------ History:  PMH:   Congestive heart failure.  Transient ischemic attack.  Chronic obstructive pulmonary disease. Risk factors:  Emphysema. Dyspnea on Exertion. Edema. Peripheral vascular disease. Current tobacco use.  ------------------------------------------------------------ Study Conclusions  - Left ventricle: The cavity size was normal. Systolic   function was normal. The estimated ejection fraction was   in the range of 60% to 65%. Wall motion was normal; there   were no regional wall motion abnormalities. Features are   consistent with a pseudonormal left ventricular filling   pattern, with concomitant abnormal relaxation and    increased filling pressure (grade 2 diastolic   dysfunction). Doppler parameters are consistent with   elevated ventricular end-diastolic filling pressure. - Aortic valve: There was severe stenosis. Mean gradient:   50mm Hg (S). Peak gradient: 91mm Hg (S). Valve area:   0.94cm^2(VTI). Valve area: 0.77cm^2 (Vmax). - Mitral valve: Leaflet separation was reduced. Mobility was   restricted. Mild regurgitation. Mean gradient: 6mm Hg (D).   Valve area by pressure half-time: 2.12cm^2. Valve area by   continuity equation (using LVOT flow): 1.45cm^2. - Left atrium: The atrium was moderately dilated. Transthoracic echocardiography.  M-mode, complete 2D, spectral Doppler, and color Doppler.  Height:  Height: 152.4cm. Height: 60in.  Weight:  Weight: 120.7kg. Weight: 265.4lb.  Body mass index:  BMI: 51.9kg/m^2.  Body surface area:    BSA: 2.59m^2.  Blood pressure:     166/64.  Patient status:  Inpatient.  Location:  Bedside.  ------------------------------------------------------------  ------------------------------------------------------------ Left ventricle:  The cavity size was normal. Systolic function was normal. The estimated ejection fraction was in the range of 60% to 65%. Wall motion was normal; there were no regional wall motion abnormalities. Features are consistent with a pseudonormal left ventricular filling pattern, with concomitant abnormal relaxation and increased filling pressure (grade 2 diastolic dysfunction). Doppler parameters are consistent with elevated ventricular end-diastolic filling pressure.  ------------------------------------------------------------ Aortic valve:   Mildly thickened, mildly calcified leaflets.  Doppler:   There was severe stenosis.      VTI ratio of LVOT to aortic valve: 0.53. Valve area: 0.94cm^2(VTI). Indexed valve area: 0.45cm^2/m^2 (VTI). Peak velocity ratio of LVOT to aortic valve: 0.44. Valve area: 0.77cm^2 (Vmax). Indexed valve area:  0.36cm^2/m^2 (Vmax).    Mean gradient: 50mm Hg (S). Peak gradient: 91mm Hg (S).  ------------------------------------------------------------ Mitral valve:   Moderately thickened, mildly calcified leaflets . Leaflet separation was reduced. Mobility was restricted.  Doppler:   Mild regurgitation.    Valve area by pressure half-time: 2.12cm^2. Indexed valve area by pressure half-time: 1cm^2/m^2. Valve area by continuity equation (using LVOT flow): 1.45cm^2. Indexed valve area by continuity equation (using LVOT flow): 0.69cm^2/m^2.    Mean gradient: 6mm Hg (D). Peak gradient: 14mm Hg (D).  ------------------------------------------------------------ Left atrium:  The atrium was moderately dilated.  ------------------------------------------------------------ Right ventricle:  The cavity size was normal. Wall thickness was normal. Systolic function was normal.  ------------------------------------------------------------ Pulmonic valve:   Poorly visualized.  Doppler:   No significant regurgitation.  ------------------------------------------------------------ Tricuspid valve:   Structurally normal valve.   Leaflet separation was normal.  Doppler:  Transvalvular velocity was within the normal range.  No significant regurgitation.   ------------------------------------------------------------ Right atrium:  The atrium was normal in size.  ------------------------------------------------------------ Pericardium:  There was no pericardial effusion.  ------------------------------------------------------------  2D measurements        Normal  Doppler measurements   Normal Left ventricle                 Left ventricle LVID ED,  43.9 mm     43-52   Ea, lat    7.35 cm/s   ------ chord,                         ann, tiss PLAX                           DP LVID ES,   27.4 mm     23-38   E/Ea, lat  18.5        ------ chord,                         ann, tiss PLAX                            DP FS, chord,   38 %      >29     Ea, med    6.91 cm/s   ------ PLAX                           ann, tiss LVPW, ED   10.5 mm     ------  DP IVS/LVPW   1.25        <1.3    E/Ea, med  19.6        ------ ratio, ED                      ann, tiss     8 Ventricular septum             DP IVS, ED    13.1 mm     ------  LVOT LVOT                           Peak vel,   208 cm/s   ------ Diam, S      15 mm     ------  S Area       1.77 cm^2   ------  VTI, S     53.5 cm     ------ Aorta                          Peak         17 mm Hg  ------ Root diam,   29 mm     ------  gradient, ED                             S Left atrium                    Aortic valve AP dim       45 mm     ------  Peak vel,   478 cm/s   ------ AP dim     2.13 cm/m^2 <2.2    S index                          Mean vel,   321 cm/s   ------ Vol, S      112 ml     ------  S Vol index, 53.1 ml/m^2 ------  VTI, S      101 cm     ------  S                              Mean         50 mm Hg  ------                                gradient,                                S                                Peak         91 mm Hg  ------                                gradient,                                S                                VTI ratio  0.53        ------                                LVOT/AV                                Area, VTI  0.94 cm^2   ------                                Area index 0.45 cm^2/m ------                                (VTI)           ^2                                Peak vel   0.44        ------                                ratio,                                LVOT/AV                                Area, Vmax 0.77 cm^2   ------                                Area index 0.36 cm^2/m ------                                (  Vmax)          ^2                                Mitral valve                                Peak E vel  136 cm/s   ------                                Peak A vel  138 cm/s    ------                                Mean vel,   109 cm/s   ------                                D                                Decelerati  412 ms     150-23                                on time                0                                Pressure    104 ms     ------                                half-time                                Mean          6 mm Hg  ------                                gradient,                                D                                Peak         14 mm Hg  ------                                gradient,                                D  Peak E/A      1        ------                                ratio                                Area (PHT) 2.12 cm^2   ------                                Area index    1 cm^2/m ------                                (PHT)           ^2                                Area       1.45 cm^2   ------                                (LVOT)                                continuity                                Area index 0.69 cm^2/m ------                                (LVOT           ^2                                cont)                                Annulus    65.3 cm     ------                                VTI                                Right ventricle                                Sa vel,    16.2 cm/s   ------                                lat ann,                                tiss DP   ------------------------------------------------------------  Prepared and Electronically Authenticated by  Lewayne Bunting 2013-08-31T16:20:11.717       Pulmonary Function Tests  Baseline                                                                      Post-bronchodilator  FVC                 2.45 L  (89% predicted)          FVC                 2.35 L  (86% predicted) FEV1               1.77 L  (85% predicted)          FEV1               1.74 L  (83%  predicted) FEF25-75        1.24 L  (63% predicted)          FEF25-75        1.05 L  (53% predicted)  RV                   2.84 L  (152% predicted) DLCO              73% predicted  STS Risk Calculator  Procedure                                         Isloated AVRepl  Risk of Mortality                                5.213% Morbidity or Mortality                       26.191% Prolonged LOS                                   10.914% Short LOS                                           19.915% Permanent Stroke                            1.153% Prolonged Vent Support                      17.864% DSW Infection                                     0.792% Renal Failure                                       4.452% Reoperation  7.760%  6 Minute Walk Test Results  Patient:            [name] Date:               03/05/2012  Patient attempted to walk but couldn't walk but a couple steps before getting very short of breath and weak.  She stated this was normal for her.    Impression:  The patient has severe symptomatic aortic stenosis although her symptoms of chronic exertional shortness of breath are clearly multifactorial.  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  On paper the patient's risks associated with conventional aortic valve replacement would only be moderately elevated with expected operative mortality using the STS risk calculator just over 5%. However, I would not consider this patient a candidate for conventional aortic valve replacement due to her severe comorbid medical conditions, most notably including her severe morbid obesity with severe chronic arthritis and spinal stenosis that has  left her essentially wheelchair-bound. In fact, this patient's chronic pain and limited mobility remain her far greatest problem at this time. It is unclear to me whether or not surgical intervention for treatment of her spinal stenosis could potentially alleviate her pain and weakness enough to facilitate a significant improvement in her quality of life.  In addition, it is not at all clear to me that surgical treatment of her aortic stenosis was necessarily dramatically improve her symptoms of chronic exertional shortness of breath.   Plan:  I spent in excess of 45 minutes with the patient, her husband, and her daughter in the clinic discussing the matters regarding her severe aortic stenosis as well as her other ongoing medical problems.   The high-risk nature of any type of surgical intervention has been discussed in detail. Long-term prognosis with medical therapy was discussed. Alternative approaches such as conventional aortic valve replacement, transcatheter aortic valve replacement, and palliative medical therapy were compared and contrasted at length. This discussion was placed in the context of the patient's own specific clinical presentation and past medical history.  I have made it clear that I would not consider the patient candidate for conventional aortic valve replacement, and time skeptical that transcatheter aortic valve replacement would significantly improve her current quality of life. However, the patient is still interested in considering an aggressive approach to her treatment, although she has expressed understanding of my concerns as outlined. As a next step I think it is reasonable to discuss her problems with chronic pain and lower extremity weakness with her neurosurgeon to see if she might expect a reasonable chance for improvement following decompression of her spinal stenosis.  If so we could consider transcatheter aortic valve replacement as a means to improve her symptoms of  congestive heart failure and decrease risks associated with general anesthesia required for spinal stenosis surgery. Transesophageal echocardiogram might be helpful to further evaluate the patient's aortic stenosis and in particular the possibility of dynamic left ventricular output tract obstruction. Finally, CT angiogram will be necessary to further assess anatomical constraints for possible transcatheter aortic valve replacement. We will plan to see the patient back in the next several weeks to discuss matters further.      Salvatore Decent. Cornelius Moras, MD 03/05/2012 5:47 PM

## 2012-03-05 NOTE — Progress Notes (Signed)
Pulmonary Function Tests  Baseline      Post-bronchodilator  FVC  2.45 L  (89% predicted) FVC  2.35 L  (86% predicted) FEV1  1.77 L  (85% predicted) FEV1  1.74 L  (83% predicted) FEF25-75 1.24 L  (63% predicted) FEF25-75 1.05 L  (53% predicted)  RV  2.84 L  (152% predicted) DLCO  73% predicted  STS Risk Calculator  Procedure    Isloated AVRepl  Risk of Mortality   5.213% Morbidity or Mortality  26.191% Prolonged LOS   10.914% Short LOS    19.915% Permanent Stroke   1.153% Prolonged Vent Support  17.864% DSW Infection    0.792% Renal Failure    4.452% Reoperation    7.760%  6 Minute Walk Test Results  Patient: [name] Date:  03/05/2012  Patient attempted to walk but couldn't walk but a couple steps before getting very short of breath and weak.  She stated this was normal for her.    Review of Systems:   General:  NO decreased appetite, NO decreased  energy, YES weight gain, NO weight loss, NO fever  Cardiac:  YES chest pain with exertion, NO chest pain at rest, YES SOB with exertion, YES resting SOB, NO PND, NO orthopnea, YES palpitations, NO arrhythmia, NO atrial fibrillation, YES LE edema, NO dizzy spells, NO syncope  Respiratory:  YES shortness of breath, YES home oxygen, NO productive cough, NO dry cough, NO bronchitis, NO wheezing, NO hemoptysis, NO asthma, NO pain with inspiration or cough, NO sleep apnea, NO CPAP at night  GI:   NO difficulty swallowing, YES reflux, YES frequent heartburn, NO hiatal hernia, YES abdominal pain, YES constipation, NO diarrhea, NO hematochezia, NO hematemesis, NO melena  GU:   YES dysuria,  YES frequency, NO urinary tract infection, NO hematuria, NO enlarged prostate, YES kidney stones, NO kidney disease  Vascular:  YES pain suggestive of claudication, YES pain in feet, YES leg cramps, NO varicose veins,  DVT, NO non-healing foot ulcer  Neuro:   YES stroke, NO TIA's, NO seizures, NO headaches, NO temporary blindness one eye,  NO  slurred speech, NO peripheral neuropathy, YES chronic pain, NO instability of gait, NO memory/cognitive dysfunction  Musculoskeletal: YES arthritis, YES joint swelling, YES myalgias, YES difficulty walking, YES difficult mobility   Skin:   YES rash, YES itching, NO skin infections, YES pressure sores or ulcerations  Psych:   NO anxiety, YES depression, YES nervousness, NO unusual recent stress  Eyes:   YES blurry vision, NO floaters, NO recent vision changes, YES wears glasses or contacts  ENT:   NO hearing loss, NO loose or painful teeth, YES dentures, last saw dentist DEC 2013  Hematologic:  NO easy bruising, NO abnormal bleeding, NOclotting disorder, NO frequent epistaxis  Endocrine:  NO diabetes, does not check CBG's at home N/A

## 2012-03-05 NOTE — Consult Note (Signed)
MULTIDISCIPLINARY HEART VALVE CLINIC NOTE  Patient ID: [name] MRN: [id number] DOB/AGE: 1945/11/09 67 y.o.  Primary Care Physician:ELKINS,WILSON OLIVER, MD Primary Cardiologist: Dr Eden Emms  HPI: 67 year-old woman presenting for evaluation of aortic stenosis. She has been followed for several years for management of congestive heart failure and chronic dyspnea, both progressive over time. Dr Eden Emms has followed her closely as an outpatient and volume management has been difficult. She was hospitalized twice in the latter half of 2013, first for severe back pain and lower extremity numbness, and a second time with altered mental status and generalized weakness. Her major comorbid conditions include O2 dependent lung disease, severe venous insufficiency, morbid obesity, and chronic pain requiring narcotics.  From a cardiac perspective, she has been followed for aortic stenosis for several years. She had moderate AS by cardiac catheterization in 2010 (see cath note below). She has been deemed nonoperable but has not undergone formal cardiac surgery evaluation. Her most recent echo from August 2013 showed peak and mean transaortic gradients of 91 and 55 mm Hg, respectively with preserved LV systolic function, LVEF 65%.   From a symptomatic perspective, the patient is extremely limited by back problems and osteoarthritis. She would like to pursue back surgery for relief of spinal stenosis, but her cardiac risk has been considered prohibitive. She does have episodes of chest tightness, but not consistently related to exertion. She denies recent syncope. The patient admits to chronic orthopnea but she does not have PND. One of her biggest problems is chronic leg swelling. She has blisters and fluid weeping from the legs at times. Also states the legs are red much of the time.  Past Medical History  Diagnosis Date  . Carotid bruit     0-39% bilateral ICA by dopplers 03/2010  . Aortic stenosis     a.  Moderate echo 01/2009, then severe 03/2010 --> last echo 09/2011:  EF 60-65%, grade 2 diastolic dysfunction, severe aortic stenosis, mean gradient 50, mild MR    b. Not an operative candidate due to comorbidities, not a candidate for TAVR given PVD. c. low risk myovue 11/2007 brest attenuation EF  63%  . Venous insufficiency   . Arthritis   . Depression   . COPD (chronic obstructive pulmonary disease)   . Hypothyroidism   . Thrombocytopenia   . Hypokalemia   . Edema   . Respiratory failure     Ventilator-dependent respiratory failure secondary to hypercarbia, cardiac arrest, presumed penumonia 12/2008  . Cardiac arrest     12/2010 in setting of respiratory failure with less than 15 minutes of CPR; initial rhythm was asystole  . PVD (peripheral vascular disease)     Eval by Dr. Excell Seltzer 2012: noninvasive immaging suggested significant aortoiliac disease, ABIs in mild range  - med rx given significant comorbidities  . H/O endoscopy     For ?ampullary mass - had EGD 07/2010 with no evidence of ampullary mass, some CBD dilitation   . Chronic pain   . Lumbar stenosis     Admitted 09/2011 - not felt to be a good candidate for surgical intervention.  . CHF (congestive heart failure)     Past Surgical History  Procedure Date  . Thyroidectomy   . Laparoscopic cholecystectomy   . Lithotripsy      for nephrolithiasis  . Vaginal hysterectomy      for menorrhagia  at age 25  . Breast cyst excision   . Rotator cuff repair   . Cesarean section   .  Laminectomy     Family History  Problem Relation Age of Onset  . Emphysema Father   . Allergies Sister   . Asthma Sister   . Heart disease Father   . Cancer Mother     History   Social History  . Marital Status: Married    Spouse Name: N/A    Number of Children: N/A  . Years of Education: N/A   Occupational History  . Not on file.   Social History Main Topics  . Smoking status: Current Every Day Smoker -- 2.0 packs/day for 45 years     Types: Cigarettes  . Smokeless tobacco: Current User     Comment: PT USES [employer]  . Alcohol Use: No  . Drug Use: No  . Sexually Active: Not on file   Other Topics Concern  . Not on file   Social History Narrative   pt is married.pt has children.pt is a housewife.     Allergies  Allergen Reactions  . Latex     REACTION: itch  . Penicillins     REACTION: hives    ROS:  Review of Systems:  General: NO decreased appetite, NO decreased energy, YES weight gain, NO weight loss, NO fever  Cardiac: YES chest pain with exertion, NO chest pain at rest, YES SOB with exertion, YES resting SOB, NO PND, NO orthopnea, YES palpitations, NO arrhythmia, NO atrial fibrillation, YES LE edema, NO dizzy spells, NO syncope  Respiratory: YES shortness of breath, YES home oxygen, NO productive cough, NO dry cough, NO bronchitis, NO wheezing, NO hemoptysis, NO asthma, NO pain with inspiration or cough, NO sleep apnea, NO CPAP at night  GI: NO difficulty swallowing, YES reflux, YES frequent heartburn, NO hiatal hernia, YES abdominal pain, YES constipation, NO diarrhea, NO hematochezia, NO hematemesis, NO melena  GU: YES dysuria, YES frequency, NO urinary tract infection, NO hematuria, NO enlarged prostate, YES kidney stones, NO kidney disease  Vascular: YES pain suggestive of claudication, YES pain in feet, YES leg cramps, NO varicose veins, DVT, NO non-healing foot ulcer  Neuro: YES stroke, NO TIA's, NO seizures, NO headaches, NO temporary blindness one eye, NO slurred speech, NO peripheral neuropathy, YES chronic pain, NO instability of gait, NO memory/cognitive dysfunction  Musculoskeletal: YES arthritis, YES joint swelling, YES myalgias, YES difficulty walking, YES difficult mobility  Skin: YES rash, YES itching, NO skin infections, YES pressure sores or ulcerations  Psych: NO anxiety, YES depression, YES nervousness, NO unusual recent stress  Eyes: YES blurry vision, NO floaters, NO recent vision  changes, YES wears glasses or contacts  ENT: NO hearing loss, NO loose or painful teeth, YES dentures, last saw dentist DEC 2013  Hematologic: NO easy bruising, NO abnormal bleeding, NOclotting disorder, NO frequent epistaxis  Endocrine: NO diabetes, does not check CBG's at home N/A  BP 108/89  Pulse 87  Resp 20  Ht 5' (1.524 m)  Wt 242 lb (109.77 kg)  BMI 47.26 kg/m2  SpO2 97%  PHYSICAL EXAM: Pt is alert and oriented, obese, pleasant woman, in no distress. HEENT: normal Neck: JVP normal. Carotid upstrokes normal with bilateral bruits. No thyromegaly. Lungs: equal expansion, clear bilaterally CV: Apex is discrete and nondisplaced, RRR with grade 3/6 systolic murmur at the LSB Abd: soft, NT, +BS, no bruit, obese Back: no CVA tenderness Ext: diffuse leg edema bilaterally with erythema of both lower legs Neuro: CNII-XII intact             Strength intact =  bilaterally  2D ECHO:  Left ventricle: The cavity size was normal. Systolic function was normal. The estimated ejection fraction was in the range of 60% to 65%. Wall motion was normal; there were no regional wall motion abnormalities. Features are consistent with a pseudonormal left ventricular filling pattern, with concomitant abnormal relaxation and increased filling pressure (grade 2 diastolic dysfunction). Doppler parameters are consistent with elevated ventricular end-diastolic filling pressure.  ------------------------------------------------------------ Aortic valve: Mildly thickened, mildly calcified leaflets. Doppler: There was severe stenosis. VTI ratio of LVOT to aortic valve: 0.53. Valve area: 0.94cm^2(VTI). Indexed valve area: 0.45cm^2/m^2 (VTI). Peak velocity ratio of LVOT to aortic valve: 0.44. Valve area: 0.77cm^2 (Vmax). Indexed valve area: 0.36cm^2/m^2 (Vmax). Mean gradient: 50mm Hg (S). Peak gradient: 91mm Hg (S).  ------------------------------------------------------------ Mitral valve: Moderately  thickened, mildly calcified leaflets . Leaflet separation was reduced. Mobility was restricted. Doppler: Mild regurgitation. Valve area by pressure half-time: 2.12cm^2. Indexed valve area by pressure half-time: 1cm^2/m^2. Valve area by continuity equation (using LVOT flow): 1.45cm^2. Indexed valve area by continuity equation (using LVOT flow): 0.69cm^2/m^2. Mean gradient: 6mm Hg (D). Peak gradient: 14mm Hg (D).  ------------------------------------------------------------ Left atrium: The atrium was moderately dilated.  ------------------------------------------------------------ Right ventricle: The cavity size was normal. Wall thickness was normal. Systolic function was normal.  ------------------------------------------------------------ Pulmonic valve: Poorly visualized. Doppler: No significant regurgitation.  ------------------------------------------------------------ Tricuspid valve: Structurally normal valve. Leaflet separation was normal. Doppler: Transvalvular velocity was within the normal range. No significant regurgitation.  ------------------------------------------------------------ Right atrium: The atrium was normal in size.  ------------------------------------------------------------ Pericardium: There was no pericardial effusion.  CARDIAC CATH: 02/12/2009 RESULTS:   Left main coronary artery:  The left main coronary artery was free of significant disease.  Left anterior descending artery:  The left anterior descending artery gave rise to a large diagonal branch, several septal perforators, and a smaller diagonal branch.  These vessels were free of significant disease.  Circumflex artery:  The circumflex artery gave rise to a ramus branch and 2 posterolateral branches.  These vessels appeared to be free of significant disease.  Right coronary artery:  The right coronary artery is a large dominant vessel that gave rise to a conus branch, right  ventricular branch, and a large posterior descending and large posterolateral branch.  These vessels were free of significant disease.  Left ventriculogram:  The left ventriculogram performed in the RAO projection showed vigorous wall motion with a small and systolic volume. There was probable mild mitral regurgitation.  The aortic valve was not heavily calcified.  HEMODYNAMIC DATA:  The right atrial pressure was 10 mean.  The pulmonary artery pressure was 53/28 with a mean of 39.  The pulmonary wedge pressure was initially 22 but rose to 31 by the end of the procedure. The left ventricular pressure was 165/33 and the aortic pressure was 138/59 with a mean of 90.  The cardiac output/cardiac index was 6.5/3.6 liters/minute/meter squared by thermodilution and 4.7/2.6 liters/minutes/meter squared by Fick.  The aortic valve gradient was 27 peak and 21 mean.  The calculated aortic valve area was 0.9 cm2 by Fick and 1.2 m2 by thermodilution.  The pulmonary vascular resistance was 3.6 units.  CONCLUSION: 1. Normal coronary angiography. 2. Moderate aortic stenosis with a mean aortic valve gradient of 21     mmHg and a calculated aortic valve area of 0.9 to 1.2 cm2. 3. Evidence of diastolic dysfunction and diastolic heart failure with     an LVEDP of 33 and a pulmonary wedge of 22 to 31. 4.  Pulmonary vascular disease with elevated pulmonary vascular     resistance of 3.6 associated with known COPD.  RECOMMENDATIONS:  The patient's problems are multifactorial.  She has known severe COPD with hypoxemia.  She has associated elevated pulmonary vascular resistance with this.  She also has diastolic heart failure with elevated LVEDP and pulmonary wedge pressure.  She also has moderate aortic stenosis.  I will review these findings with Dr. Eden Emms and we will plan continued medical management.  Pulmonary Function Tests  Baseline Post-bronchodilator  FVC 2.45 L (89% predicted) FVC 2.35 L (86%  predicted)  FEV1 1.77 L (85% predicted) FEV1 1.74 L (83% predicted)  FEF25-75 1.24 L (63% predicted) FEF25-75 1.05 L (53% predicted)  RV 2.84 L (152% predicted)  DLCO 73% predicted  STS Risk Calculator  Procedure Isloated AVRepl  Risk of Mortality 5.213%  Morbidity or Mortality 26.191%  Prolonged LOS 10.914%  Short LOS 19.915%  Permanent Stroke 1.153%  Prolonged Vent Support 17.864%  DSW Infection 0.792%  Renal Failure 4.452%  Reoperation 7.760%  6 Minute Walk Test Results  Patient: [name]  Date: 03/05/2012  Patient attempted to walk but couldn't walk but a couple steps before getting very short of breath and weak. She stated this was normal for her.   ASSESSMENT AND PLAN:  This is a 67 year-old woman with severe aortic stenosis on a background of multiple medical comorbidities. She was seen in conjunction today with Dr Cornelius Moras. She does have symptoms that are likely related to valvular heart disease.  A major issue related to her AS is cardiac risk of lumbar surgery for relief of spinal stenosis. The patient feels that she could gain some functional ability if she were able to have surgery. However, it is concerning that she has been extremely limited physically for several years, and she undoubtedly has many other problems that will continue to keep her at a low functional capacity. Our concerns were discussed with the patient and her family at length today and they understand the complexity of her situation. All of her imaging data was reviewed, as were her hospital and outpatient records. She is clearly not a candidate for open AVR, and even TAVR may have limited benefit in light of her extremely poor functional capacity. If she desires to pursue treatment, next steps would be cardiac CTA to evaluate her aortic annulus and possibly TEE to better assess her LVOT as there is significant basal septal hypertrophy present. She would like to think things over and will return in several weeks  for follow-up evaluation.  Tonny Bollman

## 2012-03-09 ENCOUNTER — Other Ambulatory Visit: Payer: Self-pay | Admitting: *Deleted

## 2012-03-09 ENCOUNTER — Other Ambulatory Visit: Payer: Self-pay

## 2012-03-09 DIAGNOSIS — I359 Nonrheumatic aortic valve disorder, unspecified: Secondary | ICD-10-CM

## 2012-03-12 ENCOUNTER — Telehealth: Payer: Self-pay | Admitting: *Deleted

## 2012-03-12 NOTE — Telephone Encounter (Signed)
SPOKE WITH DAUGHTER PT WAS GIVEN SCRIPT FOR MOBILITY CHAIR  CO REQUIRES FORMS TO BE FILLED OUT  WILL HAVE COMPANY FAX FORMS TO BE  FILLED OUT AND WILL  FAX BACK ONCE COMPLETED .[name]

## 2012-03-12 NOTE — Telephone Encounter (Signed)
CALLED PT TO SCHEDULE  TEE PER  REPORT NOTES  PT DOES NOT WANT TO  HAVE ANY TESTING OR DOES NOT WANT TO PROCEED WITH SURGERY AT THIS TIME  WILL FORWARD TO DR Chanetta Marshall - 03/05/12 ','<More Detail >>       Wendall Stade, MD       Sent:  Tue March 09, 2012 2:53 PM   To:  Alois Cliche, LPN                 Message     Need to schedule patient for TEE to look at aortic valve ? TAVR   ----- Message -----   From: Purcell Nails, MD   Sent: 03/05/2012 6:23 PM   To: Barbaraann Share, MD, Wendall Stade, MD, *      Please see the attached note regarding our mutual patient from their visit in our office today. CHO            Attached Reports     The sender attached the following reports to this message:   Notes report for 03/05/2012

## 2012-03-15 NOTE — Telephone Encounter (Signed)
Her primary care doctor should be filling out these forms

## 2012-03-16 NOTE — Telephone Encounter (Signed)
PT'S DAUGHTER AWARE TO HAVE FORMS FAXED TO DR Jeannetta Nap  OFFICE TO BE FILLED OU T./CY

## 2012-03-18 ENCOUNTER — Telehealth: Payer: Self-pay | Admitting: Pulmonary Disease

## 2012-03-18 ENCOUNTER — Encounter: Payer: Self-pay | Admitting: Pulmonary Disease

## 2012-03-18 NOTE — Telephone Encounter (Signed)
Patient scheduled with KC to review PFT 04/01/12 at 3pm

## 2012-03-18 NOTE — Telephone Encounter (Signed)
Pt needs ov to discuss her breathing studies

## 2012-03-23 ENCOUNTER — Telehealth: Payer: Self-pay | Admitting: Pulmonary Disease

## 2012-03-23 ENCOUNTER — Encounter: Payer: Self-pay | Admitting: Pulmonary Disease

## 2012-03-23 NOTE — Telephone Encounter (Signed)
Please let pt know that oxygen levels overnight are ok.

## 2012-03-24 NOTE — Telephone Encounter (Signed)
Spoke with patients husband...the patient is asleep and unable to speak on the phone, husband did not let me leave a message. Husband to make wife aware of sleep study results.  appt with KC 2.6.14 at 3pm to PFT.

## 2012-03-29 ENCOUNTER — Telehealth: Payer: Self-pay | Admitting: Pulmonary Disease

## 2012-03-29 NOTE — Telephone Encounter (Signed)
I cant find her pfts in computer.  Suspect sent downstairs to be scanned.  See if we can get a copy from [facility] printed and will call pt.   Thanks

## 2012-03-29 NOTE — Telephone Encounter (Signed)
I spoke with the pt daughter and she states that it is very difficult for the pt to get out of the house. She states she is in a wheelchair and her home does not have ramps and it is very difficult for her and her dad to get the pt out. Per last OV note it states that we would call them with PFT results, but then there is a phone note stating that the pt needs an OV to discuss results. Pt daughter requests that either the pt and spouse be called with the results instead of her having to come in for OV. Please advise. Carron Curie, CMA

## 2012-03-29 NOTE — Telephone Encounter (Signed)
[name] is gone to the day. We will get these in the morning.

## 2012-03-30 ENCOUNTER — Encounter: Payer: Self-pay | Admitting: Pulmonary Disease

## 2012-03-30 NOTE — Telephone Encounter (Signed)
PFT has been reprinted and placed in [name] folder for your review.

## 2012-03-31 NOTE — Telephone Encounter (Signed)
Per [facility], patient ok to cancel appt 04/01/12. KC to give patient results via phone.

## 2012-03-31 NOTE — Telephone Encounter (Signed)
KC to give patient results via phone, appt for 04/01/12 3p has been cancelled.

## 2012-04-01 ENCOUNTER — Ambulatory Visit: Payer: Medicare Other | Admitting: Pulmonary Disease

## 2012-04-05 ENCOUNTER — Encounter: Payer: Self-pay | Admitting: Pulmonary Disease

## 2012-04-12 ENCOUNTER — Encounter: Payer: Self-pay | Admitting: Pulmonary Disease

## 2012-07-05 ENCOUNTER — Other Ambulatory Visit: Payer: Self-pay | Admitting: *Deleted

## 2012-07-05 MED ORDER — FUROSEMIDE 80 MG PO TABS: 80.0000 mg | ORAL_TABLET | Freq: Two times a day (BID) | ORAL | Status: DC

## 2012-09-17 ENCOUNTER — Inpatient Hospital Stay (HOSPITAL_COMMUNITY)
Admission: EM | Admit: 2012-09-17 | Discharge: 2012-09-23 | DRG: 872 | Disposition: A | Discharge status: Home Health | Payer: Medicare Other | Attending: Internal Medicine | Admitting: Internal Medicine

## 2012-09-17 ENCOUNTER — Encounter (HOSPITAL_COMMUNITY): Payer: Self-pay | Admitting: Emergency Medicine

## 2012-09-17 ENCOUNTER — Emergency Department (HOSPITAL_COMMUNITY): Payer: Medicare Other

## 2012-09-17 DIAGNOSIS — I2510 Atherosclerotic heart disease of native coronary artery without angina pectoris: Secondary | ICD-10-CM | POA: Diagnosis present

## 2012-09-17 DIAGNOSIS — M129 Arthropathy, unspecified: Secondary | ICD-10-CM

## 2012-09-17 DIAGNOSIS — A4101 Sepsis due to Methicillin susceptible Staphylococcus aureus: Principal | ICD-10-CM | POA: Diagnosis present

## 2012-09-17 DIAGNOSIS — R0989 Other specified symptoms and signs involving the circulatory and respiratory systems: Secondary | ICD-10-CM

## 2012-09-17 DIAGNOSIS — I959 Hypotension, unspecified: Secondary | ICD-10-CM | POA: Diagnosis present

## 2012-09-17 DIAGNOSIS — L02419 Cutaneous abscess of limb, unspecified: Secondary | ICD-10-CM | POA: Diagnosis present

## 2012-09-17 DIAGNOSIS — L03119 Cellulitis of unspecified part of limb: Secondary | ICD-10-CM | POA: Diagnosis present

## 2012-09-17 DIAGNOSIS — J449 Chronic obstructive pulmonary disease, unspecified: Secondary | ICD-10-CM

## 2012-09-17 DIAGNOSIS — A419 Sepsis, unspecified organism: Secondary | ICD-10-CM

## 2012-09-17 DIAGNOSIS — F329 Major depressive disorder, single episode, unspecified: Secondary | ICD-10-CM

## 2012-09-17 DIAGNOSIS — M48061 Spinal stenosis, lumbar region without neurogenic claudication: Secondary | ICD-10-CM

## 2012-09-17 DIAGNOSIS — I872 Venous insufficiency (chronic) (peripheral): Secondary | ICD-10-CM

## 2012-09-17 DIAGNOSIS — I509 Heart failure, unspecified: Secondary | ICD-10-CM

## 2012-09-17 DIAGNOSIS — F3289 Other specified depressive episodes: Secondary | ICD-10-CM

## 2012-09-17 DIAGNOSIS — G4733 Obstructive sleep apnea (adult) (pediatric): Secondary | ICD-10-CM | POA: Diagnosis present

## 2012-09-17 DIAGNOSIS — B9561 Methicillin susceptible Staphylococcus aureus infection as the cause of diseases classified elsewhere: Secondary | ICD-10-CM

## 2012-09-17 DIAGNOSIS — Z91040 Latex allergy status: Secondary | ICD-10-CM

## 2012-09-17 DIAGNOSIS — J4489 Other specified chronic obstructive pulmonary disease: Secondary | ICD-10-CM

## 2012-09-17 DIAGNOSIS — L0291 Cutaneous abscess, unspecified: Secondary | ICD-10-CM

## 2012-09-17 DIAGNOSIS — L0390 Cellulitis, unspecified: Secondary | ICD-10-CM | POA: Diagnosis present

## 2012-09-17 DIAGNOSIS — E039 Hypothyroidism, unspecified: Secondary | ICD-10-CM

## 2012-09-17 DIAGNOSIS — Z7982 Long term (current) use of aspirin: Secondary | ICD-10-CM

## 2012-09-17 DIAGNOSIS — R7881 Bacteremia: Secondary | ICD-10-CM

## 2012-09-17 DIAGNOSIS — Z9089 Acquired absence of other organs: Secondary | ICD-10-CM

## 2012-09-17 DIAGNOSIS — Z6841 Body Mass Index (BMI) 40.0 and over, adult: Secondary | ICD-10-CM

## 2012-09-17 DIAGNOSIS — R29898 Other symptoms and signs involving the musculoskeletal system: Secondary | ICD-10-CM

## 2012-09-17 DIAGNOSIS — G459 Transient cerebral ischemic attack, unspecified: Secondary | ICD-10-CM

## 2012-09-17 DIAGNOSIS — E871 Hypo-osmolality and hyponatremia: Secondary | ICD-10-CM

## 2012-09-17 DIAGNOSIS — Z836 Family history of other diseases of the respiratory system: Secondary | ICD-10-CM

## 2012-09-17 DIAGNOSIS — E119 Type 2 diabetes mellitus without complications: Secondary | ICD-10-CM | POA: Diagnosis present

## 2012-09-17 DIAGNOSIS — E662 Morbid (severe) obesity with alveolar hypoventilation: Secondary | ICD-10-CM | POA: Diagnosis present

## 2012-09-17 DIAGNOSIS — I739 Peripheral vascular disease, unspecified: Secondary | ICD-10-CM

## 2012-09-17 DIAGNOSIS — Z880 Allergy status to penicillin: Secondary | ICD-10-CM

## 2012-09-17 DIAGNOSIS — G8929 Other chronic pain: Secondary | ICD-10-CM | POA: Diagnosis present

## 2012-09-17 DIAGNOSIS — Z8674 Personal history of sudden cardiac arrest: Secondary | ICD-10-CM

## 2012-09-17 DIAGNOSIS — E872 Acidosis, unspecified: Secondary | ICD-10-CM

## 2012-09-17 DIAGNOSIS — L03115 Cellulitis of right lower limb: Secondary | ICD-10-CM

## 2012-09-17 DIAGNOSIS — I359 Nonrheumatic aortic valve disorder, unspecified: Secondary | ICD-10-CM

## 2012-09-17 DIAGNOSIS — D696 Thrombocytopenia, unspecified: Secondary | ICD-10-CM

## 2012-09-17 DIAGNOSIS — N390 Urinary tract infection, site not specified: Secondary | ICD-10-CM

## 2012-09-17 DIAGNOSIS — E860 Dehydration: Secondary | ICD-10-CM

## 2012-09-17 DIAGNOSIS — I5032 Chronic diastolic (congestive) heart failure: Secondary | ICD-10-CM

## 2012-09-17 DIAGNOSIS — Z8249 Family history of ischemic heart disease and other diseases of the circulatory system: Secondary | ICD-10-CM

## 2012-09-17 DIAGNOSIS — I5033 Acute on chronic diastolic (congestive) heart failure: Secondary | ICD-10-CM

## 2012-09-17 DIAGNOSIS — Z825 Family history of asthma and other chronic lower respiratory diseases: Secondary | ICD-10-CM

## 2012-09-17 DIAGNOSIS — Z79899 Other long term (current) drug therapy: Secondary | ICD-10-CM

## 2012-09-17 DIAGNOSIS — G9340 Encephalopathy, unspecified: Secondary | ICD-10-CM

## 2012-09-17 DIAGNOSIS — F17200 Nicotine dependence, unspecified, uncomplicated: Secondary | ICD-10-CM | POA: Diagnosis present

## 2012-09-17 DIAGNOSIS — R609 Edema, unspecified: Secondary | ICD-10-CM

## 2012-09-17 DIAGNOSIS — J438 Other emphysema: Secondary | ICD-10-CM | POA: Diagnosis present

## 2012-09-17 DIAGNOSIS — E876 Hypokalemia: Secondary | ICD-10-CM

## 2012-09-17 DIAGNOSIS — I10 Essential (primary) hypertension: Secondary | ICD-10-CM | POA: Diagnosis present

## 2012-09-17 DIAGNOSIS — J9610 Chronic respiratory failure, unspecified whether with hypoxia or hypercapnia: Secondary | ICD-10-CM

## 2012-09-17 HISTORY — DX: Essential (primary) hypertension: I10

## 2012-09-17 LAB — BASIC METABOLIC PANEL
BUN: 22 mg/dL (ref 6–23)
CO2: 27 mEq/L (ref 19–32)
Calcium: 9.1 mg/dL (ref 8.4–10.5)
Chloride: 92 mEq/L — ABNORMAL LOW (ref 96–112)
Creatinine, Ser: 0.8 mg/dL (ref 0.50–1.10)
GFR calc Af Amer: 88 mL/min — ABNORMAL LOW (ref >90)
GFR calc non Af Amer: 76 mL/min — ABNORMAL LOW (ref >90)
Glucose, Bld: 168 mg/dL — ABNORMAL HIGH (ref 70–99)
Potassium: 4.4 mEq/L (ref 3.5–5.1)
Sodium: 130 mEq/L — ABNORMAL LOW (ref 135–145)

## 2012-09-17 LAB — CBC WITH DIFFERENTIAL/PLATELET
Basophils Absolute: 0 10*3/uL (ref 0.0–0.1)
Basophils Relative: 0 % (ref 0–1)
Eosinophils Absolute: 0 10*3/uL (ref 0.0–0.7)
Eosinophils Relative: 0 % (ref 0–5)
HCT: 39.6 % (ref 36.0–46.0)
Hemoglobin: 13 g/dL (ref 12.0–15.0)
Lymphocytes Relative: 4 % — ABNORMAL LOW (ref 12–46)
Lymphs Abs: 0.4 10*3/uL — ABNORMAL LOW (ref 0.7–4.0)
MCH: 28.9 pg (ref 26.0–34.0)
MCHC: 32.8 g/dL (ref 30.0–36.0)
MCV: 88 fL (ref 78.0–100.0)
Monocytes Absolute: 0.2 10*3/uL (ref 0.1–1.0)
Monocytes Relative: 2 % — ABNORMAL LOW (ref 3–12)
Neutro Abs: 8.8 10*3/uL — ABNORMAL HIGH (ref 1.7–7.7)
Neutrophils Relative %: 94 % — ABNORMAL HIGH (ref 43–77)
Platelets: 68 10*3/uL — ABNORMAL LOW (ref 150–400)
RBC: 4.5 MIL/uL (ref 3.87–5.11)
RDW: 15.1 % (ref 11.5–15.5)
WBC: 9.4 10*3/uL (ref 4.0–10.5)

## 2012-09-17 LAB — URINE MICROSCOPIC-ADD ON

## 2012-09-17 LAB — URINALYSIS, ROUTINE W REFLEX MICROSCOPIC
Ketones, ur: 15 mg/dL — AB
Nitrite: NEGATIVE
Specific Gravity, Urine: 1.036 — ABNORMAL HIGH (ref 1.005–1.030)
pH: 6 (ref 5.0–8.0)

## 2012-09-17 LAB — CG4 I-STAT (LACTIC ACID): Lactic Acid, Venous: 4.04 mmol/L — ABNORMAL HIGH (ref 0.5–2.2)

## 2012-09-17 LAB — GLUCOSE, CAPILLARY
Glucose-Capillary: 158 mg/dL — ABNORMAL HIGH (ref 70–99)
Glucose-Capillary: 167 mg/dL — ABNORMAL HIGH (ref 70–99)

## 2012-09-17 MED ORDER — ONDANSETRON HCL 4 MG PO TABS: 4.0000 mg | ORAL_TABLET | Freq: Four times a day (QID) | ORAL | Status: DC | PRN

## 2012-09-17 MED ORDER — MORPHINE SULFATE 4 MG/ML IJ SOLN
6.0000 mg | Freq: Once | INTRAMUSCULAR | Status: AC
  Administered 2012-09-17: 6 mg via INTRAVENOUS
  Filled 2012-09-17: qty 2

## 2012-09-17 MED ORDER — ONDANSETRON HCL 4 MG/2ML IJ SOLN
4.0000 mg | Freq: Once | INTRAMUSCULAR | Status: AC
  Administered 2012-09-17: 4 mg via INTRAVENOUS
  Filled 2012-09-17: qty 2

## 2012-09-17 MED ORDER — SODIUM CHLORIDE 0.9 % IV SOLN
2.0000 g | Freq: Once | INTRAVENOUS | Status: AC
  Administered 2012-09-17: 2 g via INTRAVENOUS
  Filled 2012-09-17: qty 2

## 2012-09-17 MED ORDER — INSULIN ASPART 100 UNIT/ML ~~LOC~~ SOLN
0.0000 [IU] | Freq: Three times a day (TID) | SUBCUTANEOUS | Status: DC
  Administered 2012-09-18: 3 [IU] via SUBCUTANEOUS
  Administered 2012-09-18: 2 [IU] via SUBCUTANEOUS
  Administered 2012-09-18 – 2012-09-19 (×2): 3 [IU] via SUBCUTANEOUS
  Administered 2012-09-19 – 2012-09-20 (×4): 2 [IU] via SUBCUTANEOUS
  Administered 2012-09-20 – 2012-09-21 (×2): 3 [IU] via SUBCUTANEOUS
  Administered 2012-09-21: 5 [IU] via SUBCUTANEOUS
  Administered 2012-09-21 – 2012-09-22 (×2): 3 [IU] via SUBCUTANEOUS

## 2012-09-17 MED ORDER — ONDANSETRON HCL 4 MG/2ML IJ SOLN
4.0000 mg | Freq: Four times a day (QID) | INTRAMUSCULAR | Status: DC | PRN
  Administered 2012-09-19 – 2012-09-22 (×2): 4 mg via INTRAVENOUS
  Filled 2012-09-17 (×2): qty 2

## 2012-09-17 MED ORDER — ACETAMINOPHEN 325 MG PO TABS
650.0000 mg | ORAL_TABLET | Freq: Once | ORAL | Status: AC
  Administered 2012-09-17: 650 mg via ORAL
  Filled 2012-09-17: qty 2

## 2012-09-17 MED ORDER — INSULIN ASPART 100 UNIT/ML ~~LOC~~ SOLN: 0.0000 [IU] | Freq: Every day | SUBCUTANEOUS | Status: DC

## 2012-09-17 MED ORDER — VANCOMYCIN HCL IN DEXTROSE 1-5 GM/200ML-% IV SOLN
1000.0000 mg | INTRAVENOUS | Status: DC
  Filled 2012-09-17: qty 200

## 2012-09-17 MED ORDER — ENOXAPARIN SODIUM 40 MG/0.4ML ~~LOC~~ SOLN
40.0000 mg | SUBCUTANEOUS | Status: DC
  Filled 2012-09-17 (×2): qty 0.4

## 2012-09-17 MED ORDER — SODIUM CHLORIDE 0.9 % IV SOLN
INTRAVENOUS | Status: DC
  Administered 2012-09-17 – 2012-09-18 (×3): via INTRAVENOUS

## 2012-09-17 MED ORDER — VANCOMYCIN HCL IN DEXTROSE 1-5 GM/200ML-% IV SOLN
1000.0000 mg | Freq: Two times a day (BID) | INTRAVENOUS | Status: DC
  Administered 2012-09-18 – 2012-09-20 (×4): 1000 mg via INTRAVENOUS
  Filled 2012-09-17 (×8): qty 200

## 2012-09-17 MED ORDER — SODIUM CHLORIDE 0.9 % IJ SOLN
3.0000 mL | Freq: Two times a day (BID) | INTRAMUSCULAR | Status: DC
  Administered 2012-09-17 – 2012-09-18 (×2): 3 mL via INTRAVENOUS

## 2012-09-17 MED ORDER — VANCOMYCIN HCL IN DEXTROSE 1-5 GM/200ML-% IV SOLN
1000.0000 mg | Freq: Once | INTRAVENOUS | Status: AC
  Administered 2012-09-17: 1000 mg via INTRAVENOUS
  Filled 2012-09-17: qty 200

## 2012-09-17 MED ORDER — MEROPENEM 1 G IV SOLR
1.0000 g | Freq: Three times a day (TID) | INTRAVENOUS | Status: DC
  Administered 2012-09-18 – 2012-09-19 (×5): 1 g via INTRAVENOUS
  Filled 2012-09-17 (×9): qty 1

## 2012-09-17 MED ORDER — MORPHINE SULFATE ER 15 MG PO TBCR
60.0000 mg | EXTENDED_RELEASE_TABLET | Freq: Two times a day (BID) | ORAL | Status: DC
  Administered 2012-09-20 – 2012-09-23 (×5): 60 mg via ORAL
  Filled 2012-09-17 (×5): qty 4
  Filled 2012-09-17: qty 3
  Filled 2012-09-17 (×2): qty 4
  Filled 2012-09-17: qty 1

## 2012-09-17 MED ORDER — ACETAMINOPHEN 325 MG PO TABS
650.0000 mg | ORAL_TABLET | Freq: Four times a day (QID) | ORAL | Status: DC | PRN
  Administered 2012-09-18 – 2012-09-21 (×2): 650 mg via ORAL
  Filled 2012-09-17 (×2): qty 2

## 2012-09-17 MED ORDER — LEVOTHYROXINE SODIUM 137 MCG PO TABS
137.0000 ug | ORAL_TABLET | Freq: Every day | ORAL | Status: DC
  Administered 2012-09-18 – 2012-09-23 (×6): 137 ug via ORAL
  Filled 2012-09-17 (×7): qty 1

## 2012-09-17 MED ORDER — METOPROLOL SUCCINATE 12.5 MG HALF TABLET
12.5000 mg | ORAL_TABLET | Freq: Every day | ORAL | Status: DC
  Administered 2012-09-18 – 2012-09-23 (×6): 12.5 mg via ORAL
  Filled 2012-09-17 (×7): qty 1

## 2012-09-17 MED ORDER — SODIUM CHLORIDE 0.9 % IV BOLUS (SEPSIS)
1000.0000 mL | Freq: Once | INTRAVENOUS | Status: AC
  Administered 2012-09-17: 1000 mL via INTRAVENOUS

## 2012-09-17 MED ORDER — IBUPROFEN 400 MG PO TABS
600.0000 mg | ORAL_TABLET | Freq: Once | ORAL | Status: AC
  Administered 2012-09-17: 600 mg via ORAL
  Filled 2012-09-17: qty 2

## 2012-09-17 MED ORDER — SENNOSIDES-DOCUSATE SODIUM 8.6-50 MG PO TABS
4.0000 | ORAL_TABLET | Freq: Every day | ORAL | Status: DC
  Administered 2012-09-18 – 2012-09-23 (×3): 4 via ORAL
  Filled 2012-09-17 (×7): qty 4

## 2012-09-17 MED ORDER — SODIUM CHLORIDE 0.9 % IV BOLUS (SEPSIS): 1000.0000 mL | Freq: Once | INTRAVENOUS | Status: DC

## 2012-09-17 MED ORDER — MORPHINE SULFATE 15 MG PO TABS: 15.0000 mg | ORAL_TABLET | ORAL | Status: DC | PRN

## 2012-09-17 MED ORDER — ASPIRIN 325 MG PO TABS
325.0000 mg | ORAL_TABLET | Freq: Every day | ORAL | Status: DC
  Administered 2012-09-18 – 2012-09-23 (×6): 325 mg via ORAL
  Filled 2012-09-17 (×7): qty 1

## 2012-09-17 MED ORDER — FLUOXETINE HCL 20 MG PO CAPS
20.0000 mg | ORAL_CAPSULE | Freq: Every day | ORAL | Status: DC
  Administered 2012-09-18 – 2012-09-23 (×6): 20 mg via ORAL
  Filled 2012-09-17 (×7): qty 1

## 2012-09-17 MED ORDER — SODIUM CHLORIDE 0.9 % IV BOLUS (SEPSIS)
500.0000 mL | Freq: Once | INTRAVENOUS | Status: AC
  Administered 2012-09-17: 500 mL via INTRAVENOUS

## 2012-09-17 MED ORDER — BISACODYL 10 MG RE SUPP: 10.0000 mg | Freq: Every day | RECTAL | Status: DC | PRN

## 2012-09-17 MED ORDER — ALPRAZOLAM 0.5 MG PO TABS: 1.0000 mg | ORAL_TABLET | Freq: Three times a day (TID) | ORAL | Status: DC | PRN

## 2012-09-17 MED ORDER — SODIUM CHLORIDE 0.9 % IV BOLUS (SEPSIS): 1000.0000 mL | INTRAVENOUS | Status: DC | PRN

## 2012-09-17 MED ORDER — FAMOTIDINE 20 MG PO TABS
20.0000 mg | ORAL_TABLET | Freq: Every day | ORAL | Status: DC
  Administered 2012-09-18 – 2012-09-23 (×6): 20 mg via ORAL
  Filled 2012-09-17 (×7): qty 1

## 2012-09-17 MED ORDER — LACTULOSE 10 GM/15ML PO SOLN
10.0000 g | Freq: Two times a day (BID) | ORAL | Status: DC | PRN
  Filled 2012-09-17: qty 15

## 2012-09-17 NOTE — Progress Notes (Signed)
Pt BP 88/48 Md notified orders received. Will continue to monitor closely

## 2012-09-17 NOTE — ED Notes (Signed)
Pt to ED via EMS from home with c/o nausea for 2 days, on/off right lower leg cellulitis for 2 months. Pt states she has not been feeling well and not eating for the last 2 days. Per husband, temp-100.1 CBG-175 (diabetic), BP-100/54, O2-99% on room air, HR-98, RR-18

## 2012-09-17 NOTE — Progress Notes (Addendum)
ANTIBIOTIC CONSULT NOTE - INITIAL  Pharmacy Consult for meropenem Indication: cellulitis, sepsis  Allergies  Allergen Reactions  . Latex     REACTION: itch  . Penicillins     REACTION: hives    Patient Measurements:   Adjusted Body Weight:   Vital Signs: Temp: 102.8 F (39.3 C) (07/25 1350) Temp src: Rectal (07/25 1348) BP: 104/41 mmHg (07/25 1318) Pulse Rate: 102 (07/25 1318) Intake/Output from previous day:   Intake/Output from this shift:    Labs:  Recent Labs  09/17/12 1458  WBC 9.4  HGB 13.0  PLT PENDING   The CrCl is unknown because both a height and weight (above a minimum accepted value) are required for this calculation. No results found for this basename: VANCOTROUGH, VANCOPEAK, VANCORANDOM, GENTTROUGH, GENTPEAK, GENTRANDOM, TOBRATROUGH, TOBRAPEAK, TOBRARND, AMIKACINPEAK, AMIKACINTROU, AMIKACIN,  in the last 72 hours   Microbiology: No results found for this or any previous visit (from the past 720 hour(s)).  Medical History: Past Medical History  Diagnosis Date  . Carotid bruit     0-39% bilateral ICA by dopplers 03/2010  . Aortic stenosis     a. Moderate echo 01/2009, then severe 03/2010 --> last echo 09/2011:  EF 60-65%, grade 2 diastolic dysfunction, severe aortic stenosis, mean gradient 50, mild MR    b. Not an operative candidate due to comorbidities, not a candidate for TAVR given PVD. c. low risk myovue 11/2007 brest attenuation EF  63%  . Venous insufficiency   . Arthritis   . Depression   . COPD (chronic obstructive pulmonary disease)   . Hypothyroidism   . Thrombocytopenia   . Hypokalemia   . Edema   . Respiratory failure     Ventilator-dependent respiratory failure secondary to hypercarbia, cardiac arrest, presumed penumonia 12/2008  . Cardiac arrest     12/2010 in setting of respiratory failure with less than 15 minutes of CPR; initial rhythm was asystole  . PVD (peripheral vascular disease)     Eval by Dr. Excell Seltzer 2012: noninvasive  immaging suggested significant aortoiliac disease, ABIs in mild range  - med rx given significant comorbidities  . H/O endoscopy     For ?ampullary mass - had EGD 07/2010 with no evidence of ampullary mass, some CBD dilitation   . Chronic pain   . Lumbar stenosis     Admitted 09/2011 - not felt to be a good candidate for surgical intervention.  . CHF (congestive heart failure)   . Morbid obesity 10/27/2011  . Chronic diastolic CHF (congestive heart failure) 10/27/2011  . Lower extremity weakness 10/26/2011  . Spinal stenosis of lumbar region 10/25/2011  . ARTHRITIS 02/06/2009    Qualifier: Diagnosis of  By: Kem Parkinson    . COPD 02/06/2009    Qualifier: Diagnosis of  By: Kem Parkinson    . VENOUS INSUFFICIENCY 02/06/2009    Qualifier: Diagnosis of  By: Kem Parkinson    . Diabetes mellitus without complication   . Hypertension     Medications:  Anti-infectives   Start     Dose/Rate Route Frequency Ordered Stop   09/17/12 1530  vancomycin (VANCOCIN) IVPB 1000 mg/200 mL premix     1,000 mg 200 mL/hr over 60 Minutes Intravenous  Once 09/17/12 1525       Assessment: 66 yof presented to the ED with nausea and RLE cellulitis. Pt with Tmax of 102.8, WBC is WNL and lactic acid is elevated at 4.04. BMET is pending. Pt also received a 1x dose of vancomycin in  the ED per MD.   Meropenem 7/25>> Vanc 7/25 x 1  Goal of Therapy:  Eradication of infection  Plan:  1. Meropenem 2gm IV x 1 2. F/u labs for further doses 3. F/u further abx plans  Jackee Glasner, Drake Leach 09/17/2012,3:45 PM  Addendum: Scr is 0.8.  Plan: 1. After initial 2gm dose, start meropenem 1gm IV Q8H  2. F/u renal fxn, C&S, clinical status  Lysle Pearl, PharmD, BCPS Pager # [phone] 09/17/2012 4:07 PM  Addendum #2: Also continuing vancomycin.   Plan: 1. Give additional vanc 1gm dose now to make a total loading dose of 2gm 2. Then start vanc 1gm IV Q12H starting tomorrow 7/26 3. F/u renal fxn, C&S,  clinical status and trough at [facility], PharmD, BCPS Pager # [phone] 09/17/2012 5:02 PM

## 2012-09-17 NOTE — Progress Notes (Signed)
Patient addmited from Ed at [facility] via Doctor, general practice with RN. Patient alert and oriented x4, c/o 8/10 R LE pain . RLE Red warm and swollen with pitting edema.

## 2012-09-17 NOTE — ED Provider Notes (Signed)
CSN: [id number]     Arrival date & time 09/17/12  1315 History     First MD Initiated Contact with Patient 09/17/12 1422     Chief Complaint  Patient presents with  . Cellulitis  . Nausea   (Consider location/radiation/quality/duration/timing/severity/associated sxs/prior Treatment) HPI Pt is a 67yo female with hx of DM type 2, CHF, and CAD presenting with cellulitis of her right leg associated with nausea and fever.  Pt reports 54mo long hx of increasing right leg swelling, redness, blistering and pain, worsening over the last week.  Pain described as burning, sharp sensation up and down her right leg.  Has tried morphine for pain, which she has for chronic back pain.  No relief.  Pt and husband also report she has had decreased energy over the past week, has not eaten in 48hrs.  CBG was in 200s yesterday without eating.  Denies previous hx of hospitalization for same. Denies vomiting or diarrhea.     Past Medical History  Diagnosis Date  . Carotid bruit     0-39% bilateral ICA by dopplers 03/2010  . Aortic stenosis     a. Moderate echo 01/2009, then severe 03/2010 --> last echo 09/2011:  EF 60-65%, grade 2 diastolic dysfunction, severe aortic stenosis, mean gradient 50, mild MR    b. Not an operative candidate due to comorbidities, not a candidate for TAVR given PVD. c. low risk myovue 11/2007 brest attenuation EF  63%  . Venous insufficiency   . Arthritis   . Depression   . COPD (chronic obstructive pulmonary disease)   . Hypothyroidism   . Thrombocytopenia   . Hypokalemia   . Edema   . Respiratory failure     Ventilator-dependent respiratory failure secondary to hypercarbia, cardiac arrest, presumed penumonia 12/2008  . Cardiac arrest     12/2010 in setting of respiratory failure with less than 15 minutes of CPR; initial rhythm was asystole  . PVD (peripheral vascular disease)     Eval by Dr. Excell Seltzer 2012: noninvasive immaging suggested significant aortoiliac disease, ABIs in mild  range  - med rx given significant comorbidities  . H/O endoscopy     For ?ampullary mass - had EGD 07/2010 with no evidence of ampullary mass, some CBD dilitation   . Chronic pain   . Lumbar stenosis     Admitted 09/2011 - not felt to be a good candidate for surgical intervention.  . CHF (congestive heart failure)   . Morbid obesity 10/27/2011  . Chronic diastolic CHF (congestive heart failure) 10/27/2011  . Lower extremity weakness 10/26/2011  . Spinal stenosis of lumbar region 10/25/2011  . ARTHRITIS 02/06/2009    Qualifier: Diagnosis of  By: Kem Parkinson    . COPD 02/06/2009    Qualifier: Diagnosis of  By: Kem Parkinson    . VENOUS INSUFFICIENCY 02/06/2009    Qualifier: Diagnosis of  By: Kem Parkinson    . Diabetes mellitus without complication   . Hypertension    Past Surgical History  Procedure Laterality Date  . Thyroidectomy    . Laparoscopic cholecystectomy    . Lithotripsy       for nephrolithiasis  . Vaginal hysterectomy       for menorrhagia  at age 56  . Breast cyst excision    . Rotator cuff repair    . Cesarean section    . Laminectomy     Family History  Problem Relation Age of Onset  . Emphysema Father   . Allergies  Sister   . Asthma Sister   . Heart disease Father   . Cancer Mother    History  Substance Use Topics  . Smoking status: Current Every Day Smoker -- 2.00 packs/day for 45 years    Types: Cigarettes  . Smokeless tobacco: Current User     Comment: PT USES [employer]  . Alcohol Use: No   OB History   Grav Para Term Preterm Abortions TAB SAB Ect Mult Living                 Review of Systems  Constitutional: Positive for fever, chills, diaphoresis, activity change, appetite change and fatigue. Negative for unexpected weight change.  Respiratory: Positive for shortness of breath.   Cardiovascular: Negative for chest pain.  Musculoskeletal: Positive for myalgias, back pain, joint swelling, arthralgias and gait problem.  Skin:  Positive for color change, rash and wound. Negative for pallor.  All other systems reviewed and are negative.    Allergies  Latex and Penicillins  Home Medications   Current Outpatient Rx  Name  Route  Sig  Dispense  Refill  . ALPRAZolam (XANAX) 1 MG tablet   Oral   Take 1 mg by mouth 3 (three) times daily as needed for anxiety.          Marland Kitchen aspirin 325 MG tablet   Oral   Take 1 tablet (325 mg total) by mouth daily.         . famotidine (PEPCID) 20 MG tablet   Oral   Take 1 tablet (20 mg total) by mouth daily. For indigestion   30 tablet   1   . FLUoxetine (PROZAC) 20 MG capsule   Oral   Take 20 mg by mouth daily.           . furosemide (LASIX) 80 MG tablet   Oral   Take 40-80 mg by mouth 2 (two) times daily. 80 mg in the morning and 40 mg in the evening         . lactulose (CHRONULAC) 10 GM/15ML solution   Oral   Take 15 mLs (10 g total) by mouth 2 (two) times daily as needed (constipation).   240 mL   0   . levothyroxine (SYNTHROID, LEVOTHROID) 137 MCG tablet   Oral   Take 137 mcg by mouth daily.         . metFORMIN (GLUCOPHAGE) 1000 MG tablet   Oral   Take 1,000 mg by mouth 2 (two) times daily with a meal.         . metoprolol succinate (TOPROL-XL) 25 MG 24 hr tablet   Oral   Take 12.5 mg by mouth daily.          Marland Kitchen morphine (MS CONTIN) 60 MG 12 hr tablet   Oral   Take 60 mg by mouth every 12 (twelve) hours.          Marland Kitchen morphine (MSIR) 15 MG tablet   Oral   Take 15 mg by mouth every 4 (four) hours as needed. pain         . potassium chloride (K-DUR,KLOR-CON) 10 MEQ tablet   Oral   Take 20 mEq by mouth 2 (two) times daily.          Marland Kitchen senna-docusate (SENOKOT-S) 8.6-50 MG per tablet   Oral   Take 4 tablets by mouth daily. For constipation.         Marland Kitchen spironolactone (ALDACTONE) 25 MG tablet  Take one tab twice a day and may increase to two tab bid as needed          BP 103/51  Pulse 111  Temp(Src) 103 F (39.4 C)  (Rectal)  Resp 23  SpO2 98% Physical Exam  Nursing note and vitals reviewed. Constitutional: She appears well-developed and well-nourished.  Obese female lying in exam bed, appears chronically ill.   HENT:  Head: Normocephalic and atraumatic.  Eyes: Conjunctivae are normal. No scleral icterus.  Neck: Normal range of motion. Neck supple.  Cardiovascular: Normal rate and regular rhythm.   Murmur heard. Pulmonary/Chest: Effort normal. No respiratory distress. She has no wheezes. She has rales ( diffuse). She exhibits no tenderness.  No respiratory distress.   Abdominal: Soft. Bowel sounds are normal. She exhibits no distension and no mass. There is no tenderness. There is no rebound and no guarding.  Musculoskeletal: Normal range of motion. She exhibits edema ( bilateral legs) and tenderness ( right leg and foot).  Neurological: She is alert.  Skin: Skin is warm and dry. There is erythema.  Diffuse severe erythema, warm, edema and blistering of right lower leg and foot.  Moderate edema and mild erythema of left lower leg.      ED Course   Procedures (including critical care time)  Labs Reviewed  CBC WITH DIFFERENTIAL - Abnormal; Notable for the following:    Platelets 68 (*)    Neutrophils Relative % 94 (*)    Neutro Abs 8.8 (*)    Lymphocytes Relative 4 (*)    Lymphs Abs 0.4 (*)    Monocytes Relative 2 (*)    All other components within normal limits  BASIC METABOLIC PANEL - Abnormal; Notable for the following:    Sodium 130 (*)    Chloride 92 (*)    Glucose, Bld 168 (*)    GFR calc non Af Amer 76 (*)    GFR calc Af Amer 88 (*)    All other components within normal limits  CG4 I-STAT (LACTIC ACID) - Abnormal; Notable for the following:    Lactic Acid, Venous 4.04 (*)    All other components within normal limits  CULTURE, BLOOD (ROUTINE X 2)  CULTURE, BLOOD (ROUTINE X 2)  URINE CULTURE  URINALYSIS, ROUTINE W REFLEX MICROSCOPIC   Dg Tibia/fibula Right  09/17/2012    *RADIOLOGY REPORT*  Clinical Data: Lower leg pain, swelling and erythema extending into the foot for 3 weeks.  RIGHT TIBIA AND FIBULA - 2 VIEW  Comparison: Right knee MRI 10/31/2007.  Findings: The mineralization and alignment are normal.  There is no evidence of acute fracture, dislocation or bone destruction.  There is mildly progressive medial joint space loss at the knee.  The soft tissues of the lower leg are diffusely prominent with suspected generalized subcutaneous edema.  There is no evidence of foreign body or soft tissue emphysema.  IMPRESSION: Suspected diffuse lower leg soft tissue edema suggestive of cellulitis in this clinical context.  No soft tissue emphysema, foreign body or osseous abnormality identified.   Original Report Authenticated By: Carey Bullocks, M.D.   Dg Chest Port 1 View  09/17/2012   *RADIOLOGY REPORT*  Clinical Data: Cough and congestion.  PORTABLE CHEST - 1 VIEW  Comparison: 12/12/2011  Findings: Semi upright view of the chest demonstrates low lung volumes. There are prominent interstitial lung markings.  Heart size is upper limits of normal.  Trachea is midline.  Previous right shoulder surgery.  IMPRESSION: Low lung volumes.  Low lung volumes may be accentuating the size of the heart and interstitial lung markings.  Difficult to exclude mild edema.   Original Report Authenticated By: Richarda Overlie, M.D.   Dg Foot Complete Right  09/17/2012   *RADIOLOGY REPORT*  Clinical Data: Lower leg pain, swelling and erythema extending into the foot for 3 weeks.  RIGHT FOOT COMPLETE - 3+ VIEW  Comparison: None.  Findings: Within the foot, the bones appear mildly demineralized. There is no evidence of acute fracture, dislocation or bone destruction.  The soft tissues appear diffusely prominent, especially in the dorsum of the forefoot.  There is no evidence of foreign body or soft tissue emphysema.  IMPRESSION: Diffuse soft tissue prominence suspicious for cellulitis in this clinical  context.  No evidence of foreign body or osteomyelitis.   Original Report Authenticated By: Carey Bullocks, M.D.   1. Sepsis   2. Cellulitis of right leg     MDM  Concern for cellulitis and sepsis.  Will get labs: CBC, BMP, Lactic acid, blood cultures.   Pt is febrile at 102.8.  Acetaminophen given.  Hypotensive at 104/41, pt is on lasix, will give 500cc NS.  Lactic acid: 4.04  Plain films: soft tissue edema suggestive of cellulitis but no soft tissue emphysema, forgein body or osteomyelitis.    Pt will need to be admitted for sepsis due to cellulitis of right lower leg and foot.  Started pt on vancomycin and meropenem.  CXR, UA, urine cultures, procalcitonin also ordered.      4:23 PM Pt is stable at this time.    Consulted Dr. Benjamine Mola, internal medicine with [facility], who agreed to admit pt to [facility] 10.   Temp. 103, gave pt 600mg  ibuprofen.  Started 1L NS     Junius Finner, PA-C 09/17/12 1623

## 2012-09-17 NOTE — H&P (Signed)
[facility] History and Physical  SRITHA CHAUNCEY ZOX:[id number] DOB: 07-13-1945 DOA: 09/17/2012  Referring physician: er PCP: [name], MD  Specialists:   Chief Complaint: fevers  HPI: [name] is a 67 y.o. female  With multiple medical problems.  She comes with a 2 month hx of increasing right leg swelling, redness and pain.  She has had issues with redness in the past but it went away without abx.  This time she also developed a fever.  Decreased appetite, decreased energy.  Blood sugars have also been increasing.  +diarrhea x 1.  In the Er, there was concern for cellulitis and sepsis.  Lactic acid was elevated and BP on the low side.  X rays were done that were suggestive of cellulitis.  No osteo.  Because of BP issues and LA will admit to SDU.    Review of Systems: all systems reviewed, negative unless stated above   Past Medical History  Diagnosis Date  . Carotid bruit     0-39% bilateral ICA by dopplers 03/2010  . Aortic stenosis     a. Moderate echo 01/2009, then severe 03/2010 --> last echo 09/2011:  EF 60-65%, grade 2 diastolic dysfunction, severe aortic stenosis, mean gradient 50, mild MR    b. Not an operative candidate due to comorbidities, not a candidate for TAVR given PVD. c. low risk myovue 11/2007 brest attenuation EF  63%  . Venous insufficiency   . Arthritis   . Depression   . COPD (chronic obstructive pulmonary disease)   . Hypothyroidism   . Thrombocytopenia   . Hypokalemia   . Edema   . Respiratory failure     Ventilator-dependent respiratory failure secondary to hypercarbia, cardiac arrest, presumed penumonia 12/2008  . Cardiac arrest     12/2010 in setting of respiratory failure with less than 15 minutes of CPR; initial rhythm was asystole  . PVD (peripheral vascular disease)     Eval by Dr. Excell Seltzer 2012: noninvasive immaging suggested significant aortoiliac disease, ABIs in mild range  - med rx given significant comorbidities  . H/O  endoscopy     For ?ampullary mass - had EGD 07/2010 with no evidence of ampullary mass, some CBD dilitation   . Chronic pain   . Lumbar stenosis     Admitted 09/2011 - not felt to be a good candidate for surgical intervention.  . CHF (congestive heart failure)   . Morbid obesity 10/27/2011  . Chronic diastolic CHF (congestive heart failure) 10/27/2011  . Lower extremity weakness 10/26/2011  . Spinal stenosis of lumbar region 10/25/2011  . ARTHRITIS 02/06/2009    Qualifier: Diagnosis of  By: Kem Parkinson    . COPD 02/06/2009    Qualifier: Diagnosis of  By: Kem Parkinson    . VENOUS INSUFFICIENCY 02/06/2009    Qualifier: Diagnosis of  By: Kem Parkinson    . Diabetes mellitus without complication   . Hypertension    Past Surgical History  Procedure Laterality Date  . Thyroidectomy    . Laparoscopic cholecystectomy    . Lithotripsy       for nephrolithiasis  . Vaginal hysterectomy       for menorrhagia  at age 82  . Breast cyst excision    . Rotator cuff repair    . Cesarean section    . Laminectomy     Social History:  reports that she has been smoking Cigarettes.  She has a 90 pack-year smoking history. She uses smokeless tobacco. She  reports that she does not drink alcohol or use illicit drugs. From home  Allergies  Allergen Reactions  . Latex     REACTION: itch  . Penicillins     REACTION: hives    Family History  Problem Relation Age of Onset  . Emphysema Father   . Allergies Sister   . Asthma Sister   . Heart disease Father   . Cancer Mother     Prior to Admission medications   Medication Sig Start Date End Date Taking? Authorizing Provider  ALPRAZolam Prudy Feeler) 1 MG tablet Take 1 mg by mouth 3 (three) times daily as needed for anxiety.  02/04/12  Yes Historical Provider, MD  aspirin 325 MG tablet Take 1 tablet (325 mg total) by mouth daily. 12/14/11  Yes Joseph Art, DO  famotidine (PEPCID) 20 MG tablet Take 1 tablet (20 mg total) by mouth daily. For  indigestion 11/07/11 11/06/12 Yes Evlyn Kanner Love, PA-C  FLUoxetine (PROZAC) 20 MG capsule Take 20 mg by mouth daily.     Yes Historical Provider, MD  furosemide (LASIX) 80 MG tablet Take 40-80 mg by mouth 2 (two) times daily. 80 mg in the morning and 40 mg in the evening   Yes Historical Provider, MD  lactulose (CHRONULAC) 10 GM/15ML solution Take 15 mLs (10 g total) by mouth 2 (two) times daily as needed (constipation). 12/14/11  Yes Joseph Art, DO  levothyroxine (SYNTHROID, LEVOTHROID) 137 MCG tablet Take 137 mcg by mouth daily.   Yes Historical Provider, MD  metFORMIN (GLUCOPHAGE) 1000 MG tablet Take 1,000 mg by mouth 2 (two) times daily with a meal.   Yes Historical Provider, MD  metoprolol succinate (TOPROL-XL) 25 MG 24 hr tablet Take 12.5 mg by mouth daily.    Yes Historical Provider, MD  morphine (MS CONTIN) 60 MG 12 hr tablet Take 60 mg by mouth every 12 (twelve) hours.    Yes Historical Provider, MD  morphine (MSIR) 15 MG tablet Take 15 mg by mouth every 4 (four) hours as needed. pain   Yes Historical Provider, MD  potassium chloride (K-DUR,KLOR-CON) 10 MEQ tablet Take 20 mEq by mouth 2 (two) times daily.    Yes Historical Provider, MD  senna-docusate (SENOKOT-S) 8.6-50 MG per tablet Take 4 tablets by mouth daily. For constipation. 11/07/11 11/06/12 Yes Evlyn Kanner Love, PA-C  spironolactone (ALDACTONE) 25 MG tablet Take one tab twice a day and may increase to two tab bid as needed 12/14/11  Yes Joseph Art, DO   Physical Exam: Filed Vitals:   09/17/12 1430 09/17/12 1600 09/17/12 1615 09/17/12 1630  BP: 107/45 103/51 108/42 106/56  Pulse: 106 111 111 110  Temp:  103 F (39.4 C)    TempSrc:  Rectal    Resp:  23 15 18   SpO2: 98% 98% 99% 94%     General:  sleepy  Eyes: wnl  ENT: wnl  Neck: supple, + bruit  Cardiovascular: rrr  Respiratory: decreased breath sounds, no wheezing/crackles  Abdomen: +BS, soft, obese  Skin: +edema with sores/blisters and redness in right  leg  Musculoskeletal: moves all 4 ext  Psychiatric: sleepy  Neurologic: no focal deficit  Labs on Admission:  Basic Metabolic Panel:  Recent Labs Lab 09/17/12 1458  NA 130*  K 4.4  CL 92*  CO2 27  GLUCOSE 168*  BUN 22  CREATININE 0.80  CALCIUM 9.1   Liver Function Tests: No results found for this basename: AST, ALT, ALKPHOS, BILITOT, PROT, ALBUMIN,  in  the last 168 hours No results found for this basename: LIPASE, AMYLASE,  in the last 168 hours No results found for this basename: AMMONIA,  in the last 168 hours CBC:  Recent Labs Lab 09/17/12 1458  WBC 9.4  NEUTROABS 8.8*  HGB 13.0  HCT 39.6  MCV 88.0  PLT 68*   Cardiac Enzymes: No results found for this basename: CKTOTAL, CKMB, CKMBINDEX, TROPONINI,  in the last 168 hours  BNP (last 3 results)  Recent Labs  11/21/11 1733 12/11/11 1425 12/13/11 0830  PROBNP 128.2* 300.4* 197.8*   CBG: No results found for this basename: GLUCAP,  in the last 168 hours  Radiological Exams on Admission: Dg Tibia/fibula Right  09/17/2012   *RADIOLOGY REPORT*  Clinical Data: Lower leg pain, swelling and erythema extending into the foot for 3 weeks.  RIGHT TIBIA AND FIBULA - 2 VIEW  Comparison: Right knee MRI 10/31/2007.  Findings: The mineralization and alignment are normal.  There is no evidence of acute fracture, dislocation or bone destruction.  There is mildly progressive medial joint space loss at the knee.  The soft tissues of the lower leg are diffusely prominent with suspected generalized subcutaneous edema.  There is no evidence of foreign body or soft tissue emphysema.  IMPRESSION: Suspected diffuse lower leg soft tissue edema suggestive of cellulitis in this clinical context.  No soft tissue emphysema, foreign body or osseous abnormality identified.   Original Report Authenticated By: Carey Bullocks, M.D.   Dg Chest Port 1 View  09/17/2012   *RADIOLOGY REPORT*  Clinical Data: Cough and congestion.  PORTABLE CHEST - 1  VIEW  Comparison: 12/12/2011  Findings: Semi upright view of the chest demonstrates low lung volumes. There are prominent interstitial lung markings.  Heart size is upper limits of normal.  Trachea is midline.  Previous right shoulder surgery.  IMPRESSION: Low lung volumes.  Low lung volumes may be accentuating the size of the heart and interstitial lung markings.  Difficult to exclude mild edema.   Original Report Authenticated By: Richarda Overlie, M.D.   Dg Foot Complete Right  09/17/2012   *RADIOLOGY REPORT*  Clinical Data: Lower leg pain, swelling and erythema extending into the foot for 3 weeks.  RIGHT FOOT COMPLETE - 3+ VIEW  Comparison: None.  Findings: Within the foot, the bones appear mildly demineralized. There is no evidence of acute fracture, dislocation or bone destruction.  The soft tissues appear diffusely prominent, especially in the dorsum of the forefoot.  There is no evidence of foreign body or soft tissue emphysema.  IMPRESSION: Diffuse soft tissue prominence suspicious for cellulitis in this clinical context.  No evidence of foreign body or osteomyelitis.   Original Report Authenticated By: Carey Bullocks, M.D.      Assessment/Plan Active Problems:   AORTIC STENOSIS   Cellulitis   PVD (peripheral vascular disease)   Morbid obesity   Lactic acidosis   Hypotension   Hyponatremia   1. Cellulitis: IV vanc/merrem per pharmacy, duplex to r/o DVT, pain management, blood cultures 2. Severe sepsis- monitor LA and procalcitonin 3. Lactic acidosis- IVF- gentle as patient has AS 4. Morbid obesity- most like obesity hypoventilation +/- OSA 5. Hyponatremia- hold lasix and gentle IVF; recheck in AM 6. Hypotension- IVF    Code Status: full Family Communication: husband/daughter at bedside Disposition Plan: sdu  Time spent: 63  Marlin Canary [facility] Pager [phone]  If 7PM-7AM, please contact night-coverage [email or web address] Password [facility] 09/17/2012, 5:03 PM

## 2012-09-17 NOTE — Progress Notes (Signed)
Made x2 attempts to contact ED from report at 1631 and 1640. Rn was unavailable to give report at time

## 2012-09-18 DIAGNOSIS — I359 Nonrheumatic aortic valve disorder, unspecified: Secondary | ICD-10-CM

## 2012-09-18 DIAGNOSIS — L02419 Cutaneous abscess of limb, unspecified: Secondary | ICD-10-CM

## 2012-09-18 LAB — BASIC METABOLIC PANEL
BUN: 25 mg/dL — ABNORMAL HIGH (ref 6–23)
Calcium: 8.6 mg/dL (ref 8.4–10.5)
Creatinine, Ser: 0.82 mg/dL (ref 0.50–1.10)
GFR calc non Af Amer: 73 mL/min — ABNORMAL LOW (ref >90)
Glucose, Bld: 169 mg/dL — ABNORMAL HIGH (ref 70–99)

## 2012-09-18 LAB — CBC
MCH: 29.4 pg (ref 26.0–34.0)
MCHC: 33.2 g/dL (ref 30.0–36.0)
Platelets: 59 10*3/uL — ABNORMAL LOW (ref 150–400)
RDW: 15.4 % (ref 11.5–15.5)

## 2012-09-18 LAB — GLUCOSE, CAPILLARY
Glucose-Capillary: 152 mg/dL — ABNORMAL HIGH (ref 70–99)
Glucose-Capillary: 182 mg/dL — ABNORMAL HIGH (ref 70–99)
Glucose-Capillary: 182 mg/dL — ABNORMAL HIGH (ref 70–99)

## 2012-09-18 LAB — URINE CULTURE

## 2012-09-18 MED ORDER — HYDROCODONE-ACETAMINOPHEN 10-325 MG PO TABS: 1.0000 | ORAL_TABLET | Freq: Four times a day (QID) | ORAL | Status: DC | PRN

## 2012-09-18 NOTE — Progress Notes (Signed)
CRITICAL VALUE ALERT  Critical value received:  Gram + cocci in 2 sets of blood cx   Date of notification: 09/18/12  Time of notification: 1300  Critical value read back: yes   Nurse who received alert:  Lindajo Royal, RN  MD notified (1st page): Marlin Canary  Time of first page: 1310  MD notified (2nd page):  Time of second page:  Responding MD:   Time MD responded:  MD notified of positive blood cultures via text page.

## 2012-09-18 NOTE — Progress Notes (Signed)
Pt transferred to 3W 35 via wheelchair on 3LNC O2 and on telemetry monitoring.  VSS and pt tolerated transfer well, although sleepy.  Pt helped to bed from wheelchair and received by new RN.

## 2012-09-18 NOTE — Progress Notes (Signed)
[facility] PROGRESS NOTE  [name] AOZ:[id number] DOB: 1945-08-14 DOA: 09/17/2012 PCP: [name], MD  Assessment/Plan: 1. Cellulitis: IV vanc/merrem per pharmacy, duplex to r/o DVT, pain management, blood cultures, elevate extremity, wound care consult 2. Severe sepsis- monitor LA and procalcitonin- Blood pressure improved 3. Lactic acidosis- IVF- LA decreased 4. Morbid obesity- most like obesity hypoventilation +/- OSA- O2 at night 5. Hyponatremia- hold lasix and gentle IVF- prob resume lasix in AM 6. Hypotension- IVF 7. No lovenox secondary to dec plts   Code Status: full Family Communication: patient Disposition Plan: tx to tele   Consultants:  none  Procedures:  none  Antibiotics:  vanc/merrem  HPI/Subjective: Feeling better Pain in legs still no SOB  Objective: Filed Vitals:   09/18/12 0100 09/18/12 0300 09/18/12 0443 09/18/12 0500  BP: 94/46 110/57  116/51  Pulse: 91 96  100  Temp:   98.5 F (36.9 C)   TempSrc:   Oral   Resp: 15 19  14   SpO2: 95% 97%  96%    Intake/Output Summary (Last 24 hours) at 09/18/12 0803 Last data filed at 09/18/12 0500  Gross per 24 hour  Intake    650 ml  Output    590 ml  Net     60 ml   There were no vitals filed for this visit.  Exam:   General:  Pleasant/cooperative  Cardiovascular: rrr  Respiratory: clear anterior  Abdomen: +BS, obese  Musculoskeletal: moves all 4 ext  Right leg with blisters still  Data Reviewed: Basic Metabolic Panel:  Recent Labs Lab 09/17/12 1458 09/18/12 0500  NA 130* 132*  K 4.4 4.3  CL 92* 99  CO2 27 24  GLUCOSE 168* 169*  BUN 22 25*  CREATININE 0.80 0.82  CALCIUM 9.1 8.6   Liver Function Tests: No results found for this basename: AST, ALT, ALKPHOS, BILITOT, PROT, ALBUMIN,  in the last 168 hours No results found for this basename: LIPASE, AMYLASE,  in the last 168 hours No results found for this basename: AMMONIA,  in the last 168  hours CBC:  Recent Labs Lab 09/17/12 1458 09/18/12 0500  WBC 9.4 6.5  NEUTROABS 8.8*  --   HGB 13.0 12.5  HCT 39.6 37.6  MCV 88.0 88.5  PLT 68* 59*   Cardiac Enzymes: No results found for this basename: CKTOTAL, CKMB, CKMBINDEX, TROPONINI,  in the last 168 hours BNP (last 3 results)  Recent Labs  11/21/11 1733 12/11/11 1425 12/13/11 0830  PROBNP 128.2* 300.4* 197.8*   CBG:  Recent Labs Lab 09/17/12 1758 09/17/12 2156  GLUCAP 158* 167*    Recent Results (from the past 240 hour(s))  MRSA PCR SCREENING     Status: None   Collection Time    09/17/12  6:51 PM      Result Value Range Status   MRSA by PCR NEGATIVE  NEGATIVE Final   Comment:            The GeneXpert MRSA Assay (FDA     approved for NASAL specimens     only), is one component of a     comprehensive MRSA colonization     surveillance program. It is not     intended to diagnose MRSA     infection nor to guide or     monitor treatment for     MRSA infections.     Studies: Dg Tibia/fibula Right  09/17/2012   *RADIOLOGY REPORT*  Clinical Data: Lower leg pain, swelling  and erythema extending into the foot for 3 weeks.  RIGHT TIBIA AND FIBULA - 2 VIEW  Comparison: Right knee MRI 10/31/2007.  Findings: The mineralization and alignment are normal.  There is no evidence of acute fracture, dislocation or bone destruction.  There is mildly progressive medial joint space loss at the knee.  The soft tissues of the lower leg are diffusely prominent with suspected generalized subcutaneous edema.  There is no evidence of foreign body or soft tissue emphysema.  IMPRESSION: Suspected diffuse lower leg soft tissue edema suggestive of cellulitis in this clinical context.  No soft tissue emphysema, foreign body or osseous abnormality identified.   Original Report Authenticated By: Carey Bullocks, M.D.   Dg Chest Port 1 View  09/17/2012   *RADIOLOGY REPORT*  Clinical Data: Cough and congestion.  PORTABLE CHEST - 1 VIEW   Comparison: 12/12/2011  Findings: Semi upright view of the chest demonstrates low lung volumes. There are prominent interstitial lung markings.  Heart size is upper limits of normal.  Trachea is midline.  Previous right shoulder surgery.  IMPRESSION: Low lung volumes.  Low lung volumes may be accentuating the size of the heart and interstitial lung markings.  Difficult to exclude mild edema.   Original Report Authenticated By: Richarda Overlie, M.D.   Dg Foot Complete Right  09/17/2012   *RADIOLOGY REPORT*  Clinical Data: Lower leg pain, swelling and erythema extending into the foot for 3 weeks.  RIGHT FOOT COMPLETE - 3+ VIEW  Comparison: None.  Findings: Within the foot, the bones appear mildly demineralized. There is no evidence of acute fracture, dislocation or bone destruction.  The soft tissues appear diffusely prominent, especially in the dorsum of the forefoot.  There is no evidence of foreign body or soft tissue emphysema.  IMPRESSION: Diffuse soft tissue prominence suspicious for cellulitis in this clinical context.  No evidence of foreign body or osteomyelitis.   Original Report Authenticated By: Carey Bullocks, M.D.    Scheduled Meds: . aspirin  325 mg Oral Daily  . enoxaparin (LOVENOX) injection  40 mg Subcutaneous Q24H  . famotidine  20 mg Oral Daily  . FLUoxetine  20 mg Oral Daily  . insulin aspart  0-15 Units Subcutaneous TID WC  . insulin aspart  0-5 Units Subcutaneous QHS  . levothyroxine  137 mcg Oral QAC breakfast  . meropenem (MERREM) IV  1 g Intravenous Q8H  . metoprolol succinate  12.5 mg Oral Daily  . morphine  60 mg Oral Q12H  . senna-docusate  4 tablet Oral Daily  . sodium chloride  3 mL Intravenous Q12H  . vancomycin  1,000 mg Intravenous NOW  . vancomycin  1,000 mg Intravenous Q12H   Continuous Infusions: . sodium chloride 50 mL/hr at 09/18/12 0600    Active Problems:   AORTIC STENOSIS   Cellulitis   PVD (peripheral vascular disease)   Morbid obesity   Lactic  acidosis   Hypotension   Hyponatremia    Time spent: 35    [facility], Ranetta Armacost  [facility] Pager [phone]. If 7PM-7AM, please contact night-coverage at [email or web address], password [facility] 09/18/2012, 8:03 AM  LOS: 1 day

## 2012-09-18 NOTE — ED Provider Notes (Signed)
Medical screening examination/treatment/procedure(s) were conducted as a shared visit with non-physician practitioner(s) and myself.  I personally evaluated the patient during the encounter   Loren Racer, MD 09/18/12 1114

## 2012-09-19 DIAGNOSIS — M79609 Pain in unspecified limb: Secondary | ICD-10-CM

## 2012-09-19 DIAGNOSIS — M7989 Other specified soft tissue disorders: Secondary | ICD-10-CM

## 2012-09-19 DIAGNOSIS — A4901 Methicillin susceptible Staphylococcus aureus infection, unspecified site: Secondary | ICD-10-CM

## 2012-09-19 DIAGNOSIS — E872 Acidosis: Secondary | ICD-10-CM

## 2012-09-19 DIAGNOSIS — R7881 Bacteremia: Secondary | ICD-10-CM

## 2012-09-19 DIAGNOSIS — I059 Rheumatic mitral valve disease, unspecified: Secondary | ICD-10-CM

## 2012-09-19 LAB — GLUCOSE, CAPILLARY: Glucose-Capillary: 150 mg/dL — ABNORMAL HIGH (ref 70–99)

## 2012-09-19 LAB — BASIC METABOLIC PANEL
BUN: 16 mg/dL (ref 6–23)
Calcium: 8.5 mg/dL (ref 8.4–10.5)
Creatinine, Ser: 0.63 mg/dL (ref 0.50–1.10)
GFR calc Af Amer: >90 mL/min (ref >90)
GFR calc non Af Amer: >90 mL/min (ref >90)
Potassium: 4.1 mEq/L (ref 3.5–5.1)

## 2012-09-19 LAB — CBC
HCT: 34.7 % — ABNORMAL LOW (ref 36.0–46.0)
MCHC: 32.6 g/dL (ref 30.0–36.0)
RDW: 15.2 % (ref 11.5–15.5)

## 2012-09-19 MED ORDER — FUROSEMIDE 40 MG PO TABS
40.0000 mg | ORAL_TABLET | Freq: Every day | ORAL | Status: DC
  Administered 2012-09-19 – 2012-09-21 (×3): 40 mg via ORAL
  Filled 2012-09-19 (×3): qty 1

## 2012-09-19 MED ORDER — CEFAZOLIN SODIUM-DEXTROSE 2-3 GM-% IV SOLR
2.0000 g | Freq: Three times a day (TID) | INTRAVENOUS | Status: DC
  Administered 2012-09-19 – 2012-09-22 (×9): 2 g via INTRAVENOUS
  Filled 2012-09-19 (×12): qty 50

## 2012-09-19 NOTE — Progress Notes (Signed)
[facility] PROGRESS NOTE  [name] NUU:[id number] DOB: May 31, 1945 DOA: 09/17/2012 PCP: [name], MD  Assessment/Plan: 1. Cellulitis: IV vanc/merrem per pharmacy, duplex to r/o DVT, pain management, blood cultures, elevate extremity, wound care consult 2. Bacteremia- gram + cocci 2/2- on abx- will consult with ID once final results back 3. Severe sepsis- resolving 4. Lactic acidosis- IVF- LA decreased 5. Morbid obesity- most like obesity hypoventilation +/- OSA- O2 at night 6. Hyponatremia- hold lasix and gentle IVF- resume lower dose of lasix 7. Hypotension- IVF 8. No lovenox secondary to dec plts   Code Status: full Family Communication: patient Disposition Plan: tx to tele   Consultants:  none  Procedures:  none  Antibiotics:  vanc/merrem  HPI/Subjective: Feels "achy" all over no SOB  Objective: Filed Vitals:   09/18/12 1451 09/18/12 1703 09/18/12 2100 09/19/12 0500  BP: 108/73 105/40 80/49 109/80  Pulse: 93 88 86 85  Temp: 100.5 F (38.1 C) 98.6 F (37 C) 98.2 F (36.8 C) 99.2 F (37.3 C)  TempSrc: Oral Oral Oral Oral  Resp: 18 18 20 20   SpO2: 96% 91% 95% 99%    Intake/Output Summary (Last 24 hours) at 09/19/12 0836 Last data filed at 09/19/12 0300  Gross per 24 hour  Intake    300 ml  Output    176 ml  Net    124 ml   There were no vitals filed for this visit.  Exam:   General:  Pleasant/cooperative  Cardiovascular: rrr  Respiratory: clear anterior  Abdomen: +BS, obese  Musculoskeletal: moves all 4 ext  Right leg with blisters still- decreasing redness  Data Reviewed: Basic Metabolic Panel:  Recent Labs Lab 09/17/12 1458 09/18/12 0500  NA 130* 132*  K 4.4 4.3  CL 92* 99  CO2 27 24  GLUCOSE 168* 169*  BUN 22 25*  CREATININE 0.80 0.82  CALCIUM 9.1 8.6   Liver Function Tests: No results found for this basename: AST, ALT, ALKPHOS, BILITOT, PROT, ALBUMIN,  in the last 168 hours No results found for  this basename: LIPASE, AMYLASE,  in the last 168 hours No results found for this basename: AMMONIA,  in the last 168 hours CBC:  Recent Labs Lab 09/17/12 1458 09/18/12 0500  WBC 9.4 6.5  NEUTROABS 8.8*  --   HGB 13.0 12.5  HCT 39.6 37.6  MCV 88.0 88.5  PLT 68* 59*   Cardiac Enzymes: No results found for this basename: CKTOTAL, CKMB, CKMBINDEX, TROPONINI,  in the last 168 hours BNP (last 3 results)  Recent Labs  11/21/11 1733 12/11/11 1425 12/13/11 0830  PROBNP 128.2* 300.4* 197.8*   CBG:  Recent Labs Lab 09/18/12 1232 09/18/12 1442 09/18/12 1700 09/18/12 2110 09/19/12 0753  GLUCAP 138* 130* 182* 182* 195*    Recent Results (from the past 240 hour(s))  CULTURE, BLOOD (ROUTINE X 2)     Status: None   Collection Time    09/17/12  3:00 PM      Result Value Range Status   Specimen Description BLOOD ARM RIGHT   Final   Special Requests BOTTLES DRAWN AEROBIC AND ANAEROBIC 10CC   Final   Culture  Setup Time 09/18/2012 00:41   Final   Culture     Final   Value: GRAM POSITIVE COCCI IN CLUSTERS     Note: Gram Stain Report Called to,Read Back By and Verified With: [name] 09/18/12 @ 1PM BY [name]   Report Status PENDING   Incomplete  CULTURE, BLOOD (  ROUTINE X 2)     Status: None   Collection Time    09/17/12  3:10 PM      Result Value Range Status   Specimen Description BLOOD ARM LEFT   Final   Special Requests BOTTLES DRAWN AEROBIC AND ANAEROBIC 10CC   Final   Culture  Setup Time 09/18/2012 00:44   Final   Culture     Final   Value: GRAM POSITIVE COCCI IN CLUSTERS     Note: Gram Stain Report Called to,Read Back By and Verified With: [name] 09/18/12 @ 1PM BY [name]   Report Status PENDING   Incomplete  URINE CULTURE     Status: None   Collection Time    09/17/12  6:10 PM      Result Value Range Status   Specimen Description URINE, CLEAN CATCH   Final   Special Requests vancomycin and menopenem   Final   Culture  Setup Time 09/17/2012 19:13    Final   Colony Count NO GROWTH   Final   Culture NO GROWTH   Final   Report Status 09/18/2012 FINAL   Final  MRSA PCR SCREENING     Status: None   Collection Time    09/17/12  6:51 PM      Result Value Range Status   MRSA by PCR NEGATIVE  NEGATIVE Final   Comment:            The GeneXpert MRSA Assay (FDA     approved for NASAL specimens     only), is one component of a     comprehensive MRSA colonization     surveillance program. It is not     intended to diagnose MRSA     infection nor to guide or     monitor treatment for     MRSA infections.     Studies: Dg Tibia/fibula Right  09/17/2012   *RADIOLOGY REPORT*  Clinical Data: Lower leg pain, swelling and erythema extending into the foot for 3 weeks.  RIGHT TIBIA AND FIBULA - 2 VIEW  Comparison: Right knee MRI 10/31/2007.  Findings: The mineralization and alignment are normal.  There is no evidence of acute fracture, dislocation or bone destruction.  There is mildly progressive medial joint space loss at the knee.  The soft tissues of the lower leg are diffusely prominent with suspected generalized subcutaneous edema.  There is no evidence of foreign body or soft tissue emphysema.  IMPRESSION: Suspected diffuse lower leg soft tissue edema suggestive of cellulitis in this clinical context.  No soft tissue emphysema, foreign body or osseous abnormality identified.   Original Report Authenticated By: Carey Bullocks, M.D.   Dg Chest Port 1 View  09/17/2012   *RADIOLOGY REPORT*  Clinical Data: Cough and congestion.  PORTABLE CHEST - 1 VIEW  Comparison: 12/12/2011  Findings: Semi upright view of the chest demonstrates low lung volumes. There are prominent interstitial lung markings.  Heart size is upper limits of normal.  Trachea is midline.  Previous right shoulder surgery.  IMPRESSION: Low lung volumes.  Low lung volumes may be accentuating the size of the heart and interstitial lung markings.  Difficult to exclude mild edema.   Original Report  Authenticated By: Richarda Overlie, M.D.   Dg Foot Complete Right  09/17/2012   *RADIOLOGY REPORT*  Clinical Data: Lower leg pain, swelling and erythema extending into the foot for 3 weeks.  RIGHT FOOT COMPLETE - 3+ VIEW  Comparison: None.  Findings: Within  the foot, the bones appear mildly demineralized. There is no evidence of acute fracture, dislocation or bone destruction.  The soft tissues appear diffusely prominent, especially in the dorsum of the forefoot.  There is no evidence of foreign body or soft tissue emphysema.  IMPRESSION: Diffuse soft tissue prominence suspicious for cellulitis in this clinical context.  No evidence of foreign body or osteomyelitis.   Original Report Authenticated By: Carey Bullocks, M.D.    Scheduled Meds: . aspirin  325 mg Oral Daily  . famotidine  20 mg Oral Daily  . FLUoxetine  20 mg Oral Daily  . insulin aspart  0-15 Units Subcutaneous TID WC  . insulin aspart  0-5 Units Subcutaneous QHS  . levothyroxine  137 mcg Oral QAC breakfast  . meropenem (MERREM) IV  1 g Intravenous Q8H  . metoprolol succinate  12.5 mg Oral Daily  . morphine  60 mg Oral Q12H  . senna-docusate  4 tablet Oral Daily  . sodium chloride  3 mL Intravenous Q12H  . vancomycin  1,000 mg Intravenous Q12H   Continuous Infusions:    Active Problems:   AORTIC STENOSIS   Cellulitis   PVD (peripheral vascular disease)   Morbid obesity   Lactic acidosis   Hypotension   Hyponatremia    Time spent: 35    [facility], Dariusz Brase  [facility] Pager [phone]. If 7PM-7AM, please contact night-coverage at [email or web address], password [facility] 09/19/2012, 8:36 AM  LOS: 2 days

## 2012-09-19 NOTE — Progress Notes (Signed)
VASCULAR LAB PRELIMINARY  PRELIMINARY  PRELIMINARY  PRELIMINARY  Right lower extremity venous Doppler completed.    Preliminary report:  No DVT or SVT noted in the right lower extremity.  Eligha Kmetz, RVT 09/19/2012, 4:13 PM

## 2012-09-19 NOTE — Progress Notes (Signed)
2d echocardiogram completed 

## 2012-09-19 NOTE — Consult Note (Signed)
[facility]    Date of Admission:  09/17/2012           Day 3 vancomycin        Day 3 meropenem       Reason for Consult: Staph aureus bacteremia    Referring Physician: Dr. Marlin Canary  Principal Problem:   Staphylococcus aureus bacteremia Active Problems:   THROMBOCYTOPENIA   AORTIC STENOSIS   VENOUS INSUFFICIENCY   Cellulitis   PVD (peripheral vascular disease)   Morbid obesity   Lactic acidosis   Hypotension   Hyponatremia   . aspirin  325 mg Oral Daily  . famotidine  20 mg Oral Daily  . FLUoxetine  20 mg Oral Daily  . furosemide  40 mg Oral Daily  . insulin aspart  0-15 Units Subcutaneous TID WC  . insulin aspart  0-5 Units Subcutaneous QHS  . levothyroxine  137 mcg Oral QAC breakfast  . meropenem (MERREM) IV  1 g Intravenous Q8H  . metoprolol succinate  12.5 mg Oral Daily  . morphine  60 mg Oral Q12H  . senna-docusate  4 tablet Oral Daily  . sodium chloride  3 mL Intravenous Q12H  . vancomycin  1,000 mg Intravenous Q12H    Recommendations: 1. Continue vancomycin 2. Change meropenem to cefazolin pending final blood culture results 3. Repeat blood cultures 4. Transthoracic echocardiography  Assessment: I will continue vancomycin and change meropenem to cefazolin so that she has coverage for MSSA and MRSA pending final blood culture results. She has a history of penicillin allergy but has taken and tolerated cephalexin on several occasions in the past few years. I will also repeat blood cultures now. She is at high risk for endocarditis giving her aortic stenosis. It appears that there has been significant discussion between herself, Dr. Eden Emms and Dr. Cornelius Moras about possible aortic valve replacement. Her understanding is that the procedure would carry considerable risk and might not offer significant benefit.  I will obtain a transthoracic echocardiogram. If that shows evidence of endocarditis he will need 6 weeks of IV  therapy. If it is negative for any evidence of endocarditis I would suggest that we talked to Dr. Eden Emms about the possible utility of transesophageal echocardiogram. If she is not a surgical candidate I am not sure that it would alter her management.  HPI: [name] is a 67 y.o. female with morbid obesity, aortic stenosis, multifactorial heart failure, and chronic lower extremity venous stasis. She has had recurrent lower extremity cellulitis. She recently developed increased pain, swelling and redness of her lower leg associated with fever and chills. She was admitted 2 days ago and started on empiric vancomycin and meropenem for right leg cellulitis. Both sets of admission blood cultures have grown staph aureus. She is feeling a little bit better.   Review of Systems: Constitutional: positive for anorexia, chills, fevers and malaise, negative for sweats and weight loss Eyes: negative Ears, nose, mouth, throat, and face: negative Respiratory: negative Cardiovascular: negative Gastrointestinal: negative Genitourinary:negative Integument/breast: positive for skin color change  Past Medical History  Diagnosis Date  . Carotid bruit     0-39% bilateral ICA by dopplers 03/2010  . Aortic stenosis     a. Moderate echo 01/2009, then severe 03/2010 --> last echo 09/2011:  EF 60-65%, grade 2 diastolic dysfunction, severe aortic stenosis, mean gradient 50, mild MR    b. Not an operative candidate due to comorbidities, not a  candidate for TAVR given PVD. c. low risk myovue 11/2007 brest attenuation EF  63%  . Venous insufficiency   . Arthritis   . Depression   . COPD (chronic obstructive pulmonary disease)   . Hypothyroidism   . Thrombocytopenia   . Hypokalemia   . Edema   . Respiratory failure     Ventilator-dependent respiratory failure secondary to hypercarbia, cardiac arrest, presumed penumonia 12/2008  . Cardiac arrest     12/2010 in setting of respiratory failure with less than 15  minutes of CPR; initial rhythm was asystole  . PVD (peripheral vascular disease)     Eval by Dr. Excell Seltzer 2012: noninvasive immaging suggested significant aortoiliac disease, ABIs in mild range  - med rx given significant comorbidities  . H/O endoscopy     For ?ampullary mass - had EGD 07/2010 with no evidence of ampullary mass, some CBD dilitation   . Chronic pain   . Lumbar stenosis     Admitted 09/2011 - not felt to be a good candidate for surgical intervention.  . CHF (congestive heart failure)   . Morbid obesity 10/27/2011  . Chronic diastolic CHF (congestive heart failure) 10/27/2011  . Lower extremity weakness 10/26/2011  . Spinal stenosis of lumbar region 10/25/2011  . ARTHRITIS 02/06/2009    Qualifier: Diagnosis of  By: Kem Parkinson    . COPD 02/06/2009    Qualifier: Diagnosis of  By: Kem Parkinson    . VENOUS INSUFFICIENCY 02/06/2009    Qualifier: Diagnosis of  By: Kem Parkinson    . Diabetes mellitus without complication   . Hypertension     History  Substance Use Topics  . Smoking status: Current Every Day Smoker -- 2.00 packs/day for 45 years    Types: Cigarettes  . Smokeless tobacco: Current User     Comment: PT USES VAPOR CIGARETTE  . Alcohol Use: No    Family History  Problem Relation Age of Onset  . Emphysema Father   . Allergies Sister   . Asthma Sister   . Heart disease Father   . Cancer Mother    Allergies  Allergen Reactions  . Latex     REACTION: itch  . Penicillins     REACTION: hives    OBJECTIVE: Blood pressure 98/64, pulse 81, temperature 98.5 F (36.9 C), temperature source Oral, resp. rate 18, SpO2 100.00%. General: She is pale and somewhat lethargic Skin: Chronic venous stasis changes in both lower legs with mild redness and warmth in her right lower leg Lungs: Clear Cor: Regular S1 and S2 with a harsh 2/6 systolic murmur Abdomen: Obese, soft and nontender Neuro: Normal speech and conversation  Lab Results  Component Value Date    WBC 5.1 09/19/2012   HGB 11.3* 09/19/2012   HCT 34.7* 09/19/2012   MCV 88.3 09/19/2012   PLT 51* 09/19/2012   BMET    Component Value Date/Time   NA 130* 09/19/2012 1017   K 4.1 09/19/2012 1017   CL 98 09/19/2012 1017   CO2 27 09/19/2012 1017   GLUCOSE 176* 09/19/2012 1017   BUN 16 09/19/2012 1017   CREATININE 0.63 09/19/2012 1017   CALCIUM 8.5 09/19/2012 1017   GFRNONAA >90 09/19/2012 1017   GFRAA >90 09/19/2012 1017   Lab Results  Component Value Date   ALT 22 12/11/2011   AST 46* 12/11/2011   ALKPHOS 200* 12/11/2011   BILITOT 2.1* 12/11/2011   Microbiology: Recent Results (from the past 240 hour(s))  CULTURE, BLOOD (ROUTINE X 2)  Status: None   Collection Time    09/17/12  3:00 PM      Result Value Range Status   Specimen Description BLOOD ARM RIGHT   Final   Special Requests BOTTLES DRAWN AEROBIC AND ANAEROBIC 10CC   Final   Culture  Setup Time 09/18/2012 00:41   Final   Culture     Final   Value: STAPHYLOCOCCUS AUREUS     Note: Gram Stain Report Called to,Read Back By and Verified With: [name] 09/18/12 @ 1PM BY [name]   Report Status PENDING   Incomplete  CULTURE, BLOOD (ROUTINE X 2)     Status: None   Collection Time    09/17/12  3:10 PM      Result Value Range Status   Specimen Description BLOOD ARM LEFT   Final   Special Requests BOTTLES DRAWN AEROBIC AND ANAEROBIC 10CC   Final   Culture  Setup Time 09/18/2012 00:44   Final   Culture     Final   Value: STAPHYLOCOCCUS AUREUS     Note: RIFAMPIN AND GENTAMICIN SHOULD NOT BE USED AS SINGLE DRUGS FOR TREATMENT OF STAPH INFECTIONS.     Note: Gram Stain Report Called to,Read Back By and Verified With: [name] 09/18/12 @ 1PM BY [name]   Report Status PENDING   Incomplete  URINE CULTURE     Status: None   Collection Time    09/17/12  6:10 PM      Result Value Range Status   Specimen Description URINE, CLEAN CATCH   Final   Special Requests vancomycin and menopenem   Final   Culture  Setup Time  09/17/2012 19:13   Final   Colony Count NO GROWTH   Final   Culture NO GROWTH   Final   Report Status 09/18/2012 FINAL   Final  MRSA PCR SCREENING     Status: None   Collection Time    09/17/12  6:51 PM      Result Value Range Status   MRSA by PCR NEGATIVE  NEGATIVE Final   Comment:            The GeneXpert MRSA Assay (FDA     approved for NASAL specimens     only), is one component of a     comprehensive MRSA colonization     surveillance program. It is not     intended to diagnose MRSA     infection nor to guide or     monitor treatment for     MRSA infections.    Cliffton Asters, MD [facility] for Infectious Disease [facility] [phone] pager   [phone] cell 09/19/2012, 4:05 PM

## 2012-09-20 LAB — GLUCOSE, CAPILLARY
Glucose-Capillary: 147 mg/dL — ABNORMAL HIGH (ref 70–99)
Glucose-Capillary: 143 mg/dL — ABNORMAL HIGH (ref 70–99)

## 2012-09-20 LAB — CULTURE, BLOOD (ROUTINE X 2)

## 2012-09-20 MED ORDER — BIOTENE DRY MOUTH MT LIQD
15.0000 mL | Freq: Two times a day (BID) | OROMUCOSAL | Status: DC
  Administered 2012-09-20 – 2012-09-22 (×6): 15 mL via OROMUCOSAL

## 2012-09-20 NOTE — Progress Notes (Signed)
[facility] PROGRESS NOTE  [name] ZOX:[id number] DOB: 02-24-1946 DOA: 09/17/2012 PCP: [name] Mask, MD  Assessment/Plan: 1. Cellulitis: IV vanc/cefazolin per pharmacy, duplex negative to r/o DVT, pain management, blood cultures, elevate extremity, wound care consult 2. Bacteremia- gram + cocci 2/2- on abx- repeat blood cultures negative- will need PICC line- spoke with cardiology- not a candidate for TEE nor valve replacement/repair- was on palliative care at one point 3. Severe sepsis- resolving 4. Lactic acidosis- IVF- LA decreased 5. Morbid obesity- most like obesity hypoventilation +/- OSA- O2 at night 6. Hyponatremia- hold lasix and gentle IVF- resume lower dose of lasix 7. Hypotension- IVF 8. No lovenox secondary to dec plts   Code Status: full Family Communication: patient Disposition Plan: tele   Consultants:  none  Procedures:  none  Antibiotics:  vanc/merrem  HPI/Subjective: Feels "achy" all over no SOB  Objective: Filed Vitals:   09/19/12 2126 09/20/12 0521 09/20/12 0614 09/20/12 1056  BP: 119/58 110/50  112/67  Pulse: 88 79  85  Temp: 97.5 F (36.4 C) 98.1 F (36.7 C)    TempSrc:      Resp: 18 18    Height:      Weight:   95.528 kg (210 lb 9.6 oz)   SpO2: 94% 94%      Intake/Output Summary (Last 24 hours) at 09/20/12 1322 Last data filed at 09/19/12 2100  Gross per 24 hour  Intake    410 ml  Output    201 ml  Net    209 ml   Filed Weights   09/20/12 0614  Weight: 95.528 kg (210 lb 9.6 oz)    Exam:   General:  Pleasant/cooperative  Cardiovascular: rrr  Respiratory: clear anterior  Abdomen: +BS, obese  Musculoskeletal: moves all 4 ext  Right leg with blisters still- decreasing redness  Data Reviewed: Basic Metabolic Panel:  Recent Labs Lab 09/17/12 1458 09/18/12 0500 09/19/12 1017  NA 130* 132* 130*  K 4.4 4.3 4.1  CL 92* 99 98  CO2 27 24 27   GLUCOSE 168* 169* 176*  BUN 22 25* 16  CREATININE  0.80 0.82 0.63  CALCIUM 9.1 8.6 8.5   Liver Function Tests: No results found for this basename: AST, ALT, ALKPHOS, BILITOT, PROT, ALBUMIN,  in the last 168 hours No results found for this basename: LIPASE, AMYLASE,  in the last 168 hours No results found for this basename: AMMONIA,  in the last 168 hours CBC:  Recent Labs Lab 09/17/12 1458 09/18/12 0500 09/19/12 1017  WBC 9.4 6.5 5.1  NEUTROABS 8.8*  --   --   HGB 13.0 12.5 11.3*  HCT 39.6 37.6 34.7*  MCV 88.0 88.5 88.3  PLT 68* 59* 51*   Cardiac Enzymes: No results found for this basename: CKTOTAL, CKMB, CKMBINDEX, TROPONINI,  in the last 168 hours BNP (last 3 results)  Recent Labs  11/21/11 1733 12/11/11 1425 12/13/11 0830  PROBNP 128.2* 300.4* 197.8*   CBG:  Recent Labs Lab 09/19/12 1725 09/19/12 2110 09/20/12 0456 09/20/12 0721 09/20/12 1122  GLUCAP 133* 154* 128* 147* 171*    Recent Results (from the past 240 hour(s))  CULTURE, BLOOD (ROUTINE X 2)     Status: None   Collection Time    09/17/12  3:00 PM      Result Value Range Status   Specimen Description BLOOD ARM RIGHT   Final   Special Requests BOTTLES DRAWN AEROBIC AND ANAEROBIC 10CC   Final   Culture  Setup  Time 09/18/2012 00:41   Final   Culture     Final   Value: STAPHYLOCOCCUS AUREUS     Note: SUSCEPTIBILITIES PERFORMED ON PREVIOUS CULTURE WITHIN THE LAST 5 DAYS.     Note: Gram Stain Report Called to,Read Back By and Verified With: [name] 09/18/12 @ 1PM BY [name] A.   Report Status 09/20/2012 FINAL   Final  CULTURE, BLOOD (ROUTINE X 2)     Status: None   Collection Time    09/17/12  3:10 PM      Result Value Range Status   Specimen Description BLOOD ARM LEFT   Final   Special Requests BOTTLES DRAWN AEROBIC AND ANAEROBIC 10CC   Final   Culture  Setup Time 09/18/2012 00:44   Final   Culture     Final   Value: STAPHYLOCOCCUS AUREUS     Note: RIFAMPIN AND GENTAMICIN SHOULD NOT BE USED AS SINGLE DRUGS FOR TREATMENT OF STAPH INFECTIONS.  This organism DOES NOT demonstrate inducible Clindamycin resistance in vitro.     Note: Gram Stain Report Called to,Read Back By and Verified With: [name] 09/18/12 @ 1PM BY [name] A.   Report Status 09/20/2012 FINAL   Final   Organism ID, Bacteria STAPHYLOCOCCUS AUREUS   Final  URINE CULTURE     Status: None   Collection Time    09/17/12  6:10 PM      Result Value Range Status   Specimen Description URINE, CLEAN CATCH   Final   Special Requests vancomycin and menopenem   Final   Culture  Setup Time 09/17/2012 19:13   Final   Colony Count NO GROWTH   Final   Culture NO GROWTH   Final   Report Status 09/18/2012 FINAL   Final  MRSA PCR SCREENING     Status: None   Collection Time    09/17/12  6:51 PM      Result Value Range Status   MRSA by PCR NEGATIVE  NEGATIVE Final   Comment:            The GeneXpert MRSA Assay (FDA     approved for NASAL specimens     only), is one component of a     comprehensive MRSA colonization     surveillance program. It is not     intended to diagnose MRSA     infection nor to guide or     monitor treatment for     MRSA infections.     Studies: No results found.  Scheduled Meds: . antiseptic oral rinse  15 mL Mouth Rinse BID  . aspirin  325 mg Oral Daily  .  ceFAZolin (ANCEF) IV  2 g Intravenous Q8H  . famotidine  20 mg Oral Daily  . FLUoxetine  20 mg Oral Daily  . furosemide  40 mg Oral Daily  . insulin aspart  0-15 Units Subcutaneous TID WC  . insulin aspart  0-5 Units Subcutaneous QHS  . levothyroxine  137 mcg Oral QAC breakfast  . metoprolol succinate  12.5 mg Oral Daily  . morphine  60 mg Oral Q12H  . senna-docusate  4 tablet Oral Daily  . sodium chloride  3 mL Intravenous Q12H  . vancomycin  1,000 mg Intravenous Q12H   Continuous Infusions:    Principal Problem:   Staphylococcus aureus bacteremia Active Problems:   THROMBOCYTOPENIA   AORTIC STENOSIS   VENOUS INSUFFICIENCY   Cellulitis   PVD (peripheral vascular  disease)   Morbid  obesity   Lactic acidosis   Hypotension   Hyponatremia    Time spent: 35    [facility], [name]  [facility] Pager [phone]. If 7PM-7AM, please contact night-coverage at [email or web address], password [facility] 09/20/2012, 1:22 PM  LOS: 3 days

## 2012-09-20 NOTE — Progress Notes (Signed)
Patient ID: [name], female   DOB: 03/04/1945, 67 y.o.   MRN: [id number]         [facility]    Date of Admission:  09/17/2012   Total days of antibiotics 4         Principal Problem:   Staphylococcus aureus bacteremia Active Problems:   THROMBOCYTOPENIA   AORTIC STENOSIS   VENOUS INSUFFICIENCY   Cellulitis   PVD (peripheral vascular disease)   Morbid obesity   Lactic acidosis   Hypotension   Hyponatremia   . antiseptic oral rinse  15 mL Mouth Rinse BID  . aspirin  325 mg Oral Daily  .  ceFAZolin (ANCEF) IV  2 g Intravenous Q8H  . famotidine  20 mg Oral Daily  . FLUoxetine  20 mg Oral Daily  . furosemide  40 mg Oral Daily  . insulin aspart  0-15 Units Subcutaneous TID WC  . insulin aspart  0-5 Units Subcutaneous QHS  . levothyroxine  137 mcg Oral QAC breakfast  . metoprolol succinate  12.5 mg Oral Daily  . morphine  60 mg Oral Q12H  . senna-docusate  4 tablet Oral Daily  . sodium chloride  3 mL Intravenous Q12H    Subjective: She is feeling better. Her right leg pain has improved significantly and now she is just noting some dry skin and itching.  Objective: Temp:  [97.5 F (36.4 C)-98.1 F (36.7 C)] 98.1 F (36.7 C) (07/28 0521) Pulse Rate:  [79-88] 85 (07/28 1056) Resp:  [18] 18 (07/28 0521) BP: (110-119)/(50-67) 112/67 mmHg (07/28 1056) SpO2:  [94 %] 94 % (07/28 0521) Weight:  [95.528 kg (210 lb 9.6 oz)] 95.528 kg (210 lb 9.6 oz) (07/28 4098)  General: She is more alert and comfortable Skin: Right lower leg cellulitis is much better. She has chronic venous stasis changes and dry skin Lungs: Clear Cor: Regular S1 and S2 with a harsh 2/6 systolic murmur  Lab Results Lab Results  Component Value Date   WBC 5.1 09/19/2012   HGB 11.3* 09/19/2012   HCT 34.7* 09/19/2012   MCV 88.3 09/19/2012   PLT 51* 09/19/2012    Lab Results  Component Value Date   CREATININE 0.63 09/19/2012   BUN 16 09/19/2012   NA 130* 09/19/2012   K 4.1  09/19/2012   CL 98 09/19/2012   CO2 27 09/19/2012    Lab Results  Component Value Date   ALT 22 12/11/2011   AST 46* 12/11/2011   ALKPHOS 200* 12/11/2011   BILITOT 2.1* 12/11/2011      Microbiology: Recent Results (from the past 240 hour(s))  CULTURE, BLOOD (ROUTINE X 2)     Status: None   Collection Time    09/17/12  3:00 PM      Result Value Range Status   Specimen Description BLOOD ARM RIGHT   Final   Special Requests BOTTLES DRAWN AEROBIC AND ANAEROBIC 10CC   Final   Culture  Setup Time 09/18/2012 00:41   Final   Culture     Final   Value: STAPHYLOCOCCUS AUREUS     Note: SUSCEPTIBILITIES PERFORMED ON PREVIOUS CULTURE WITHIN THE LAST 5 DAYS.     Note: Gram Stain Report Called to,Read Back By and Verified With: [name] 09/18/12 @ 1PM BY [name]   Report Status 09/20/2012 FINAL   Final  CULTURE, BLOOD (ROUTINE X 2)     Status: None   Collection Time    09/17/12  3:10  PM      Result Value Range Status   Specimen Description BLOOD ARM LEFT   Final   Special Requests BOTTLES DRAWN AEROBIC AND ANAEROBIC 10CC   Final   Culture  Setup Time 09/18/2012 00:44   Final   Culture     Final   Value: STAPHYLOCOCCUS AUREUS     Note: RIFAMPIN AND GENTAMICIN SHOULD NOT BE USED AS SINGLE DRUGS FOR TREATMENT OF STAPH INFECTIONS. This organism DOES NOT demonstrate inducible Clindamycin resistance in vitro.     Note: Gram Stain Report Called to,Read Back By and Verified With: [name] 09/18/12 @ 1PM BY [name]   Report Status 09/20/2012 FINAL   Final   Organism ID, Bacteria STAPHYLOCOCCUS AUREUS   Final  URINE CULTURE     Status: None   Collection Time    09/17/12  6:10 PM      Result Value Range Status   Specimen Description URINE, CLEAN CATCH   Final   Special Requests vancomycin and menopenem   Final   Culture  Setup Time 09/17/2012 19:13   Final   Colony Count NO GROWTH   Final   Culture NO GROWTH   Final   Report Status 09/18/2012 FINAL   Final  MRSA PCR SCREENING      Status: None   Collection Time    09/17/12  6:51 PM      Result Value Range Status   MRSA by PCR NEGATIVE  NEGATIVE Final   Comment:            The GeneXpert MRSA Assay (FDA     approved for NASAL specimens     only), is one component of a     comprehensive MRSA colonization     surveillance program. It is not     intended to diagnose MRSA     infection nor to guide or     monitor treatment for     MRSA infections.   Assessment: She has MSSA bacteremia related to right leg cellulitis and is improving. I will continue cefazolin alone pending repeat blood cultures. She is at high risk for endocarditis given her severe aortic stenosis. The vegetation was seen on transesophageal echocardiogram but we will discuss the potential utility and difficulty of the transesophageal echocardiogram with her cardiologist, Dr. Eden Emms. She tells me that she prefers not to undergo that procedure.  Plan: 1. Continue cefazolin 2. Await results of repeat blood cultures 3. Do not place PICC until we know blood cultures are negative 4. Discuss possible TEE with Dr. Leandra Kern, MD [facility] [phone] pager   [phone] cell 09/20/2012, 4:55 PM

## 2012-09-20 NOTE — Progress Notes (Signed)
Utilization review completed.  

## 2012-09-21 ENCOUNTER — Ambulatory Visit: Payer: Medicare Other | Admitting: Physician Assistant

## 2012-09-21 LAB — GLUCOSE, CAPILLARY
Glucose-Capillary: 220 mg/dL — ABNORMAL HIGH (ref 70–99)
Glucose-Capillary: 157 mg/dL — ABNORMAL HIGH (ref 70–99)

## 2012-09-21 LAB — PROCALCITONIN: Procalcitonin: 0.67 ng/mL

## 2012-09-21 MED ORDER — FUROSEMIDE 80 MG PO TABS
80.0000 mg | ORAL_TABLET | Freq: Every day | ORAL | Status: DC
  Administered 2012-09-22 – 2012-09-23 (×2): 80 mg via ORAL
  Filled 2012-09-21 (×2): qty 1

## 2012-09-21 MED ORDER — HYDROCERIN EX CREA
TOPICAL_CREAM | Freq: Every day | CUTANEOUS | Status: DC
  Administered 2012-09-21 – 2012-09-23 (×3): via TOPICAL
  Filled 2012-09-21: qty 113

## 2012-09-21 MED ORDER — SODIUM CHLORIDE 0.9 % IJ SOLN
10.0000 mL | INTRAMUSCULAR | Status: DC | PRN
  Administered 2012-09-22 – 2012-09-23 (×2): 10 mL

## 2012-09-21 NOTE — Progress Notes (Signed)
[facility] PROGRESS NOTE  [name] UJW:[id number] DOB: Mar 06, 1945 DOA: 09/17/2012 PCP: [name], MD  Assessment/Plan: 1. Cellulitis: cefazolin per pharmacy, duplex negative to r/o DVT, pain management, blood cultures 2/2 positive but repeat are negative 2/2, elevate extremity, wound care consult 2. Bacteremia- gram + cocci 2/2- on abx- repeat blood cultures negative- will need PICC line- spoke with cardiology- not a candidate for TEE nor valve replacement/repair- was on palliative care at one point 3. Severe sepsis- resolving 4. Lactic acidosis- IVF- LA decreased 5. Morbid obesity- most like obesity hypoventilation +/- OSA- O2 at night 6. Hyponatremia- d/c IVF and resume lower dose of lasix 7. Hypotension- IVF 8. No lovenox secondary to dec plts  Lost IV access, will place PICC  Code Status: full Family Communication: patient Disposition Plan: tele   Consultants:  none  Procedures:  none  Antibiotics:  vanc/merrem  HPI/Subjective: Feeling better today Swelling in LE with pain  Objective: Filed Vitals:   09/20/12 1748 09/20/12 2054 09/21/12 0514 09/21/12 0952  BP: 117/73 124/69 124/52 106/54  Pulse: 89 101 89 88  Temp: 98.5 F (36.9 C) 98.4 F (36.9 C) 98.4 F (36.9 C)   TempSrc: Oral Oral Oral   Resp: 18 18 18    Height:      Weight:      SpO2: 94% 93% 97%     Intake/Output Summary (Last 24 hours) at 09/21/12 1043 Last data filed at 09/21/12 1041  Gross per 24 hour  Intake    440 ml  Output    850 ml  Net   -410 ml   Filed Weights   09/20/12 0614  Weight: 95.528 kg (210 lb 9.6 oz)    Exam:   General:  Pleasant/cooperative  Cardiovascular: rrr  Respiratory: clear anterior  Abdomen: +BS, obese  Musculoskeletal: moves all 4 ext  Right leg with blisters still- decreasing redness  Data Reviewed: Basic Metabolic Panel:  Recent Labs Lab 09/17/12 1458 09/18/12 0500 09/19/12 1017  NA 130* 132* 130*  K 4.4 4.3 4.1   CL 92* 99 98  CO2 27 24 27   GLUCOSE 168* 169* 176*  BUN 22 25* 16  CREATININE 0.80 0.82 0.63  CALCIUM 9.1 8.6 8.5   Liver Function Tests: No results found for this basename: AST, ALT, ALKPHOS, BILITOT, PROT, ALBUMIN,  in the last 168 hours No results found for this basename: LIPASE, AMYLASE,  in the last 168 hours No results found for this basename: AMMONIA,  in the last 168 hours CBC:  Recent Labs Lab 09/17/12 1458 09/18/12 0500 09/19/12 1017  WBC 9.4 6.5 5.1  NEUTROABS 8.8*  --   --   HGB 13.0 12.5 11.3*  HCT 39.6 37.6 34.7*  MCV 88.0 88.5 88.3  PLT 68* 59* 51*   Cardiac Enzymes: No results found for this basename: CKTOTAL, CKMB, CKMBINDEX, TROPONINI,  in the last 168 hours BNP (last 3 results)  Recent Labs  11/21/11 1733 12/11/11 1425 12/13/11 0830  PROBNP 128.2* 300.4* 197.8*   CBG:  Recent Labs Lab 09/20/12 0721 09/20/12 1122 09/20/12 1633 09/20/12 2052 09/21/12 0715  GLUCAP 147* 171* 143* 134* 179*    Recent Results (from the past 240 hour(s))  CULTURE, BLOOD (ROUTINE X 2)     Status: None   Collection Time    09/17/12  3:00 PM      Result Value Range Status   Specimen Description BLOOD ARM RIGHT   Final   Special Requests BOTTLES DRAWN AEROBIC AND ANAEROBIC  10CC   Final   Culture  Setup Time 09/18/2012 00:41   Final   Culture     Final   Value: STAPHYLOCOCCUS AUREUS     Note: SUSCEPTIBILITIES PERFORMED ON PREVIOUS CULTURE WITHIN THE LAST 5 DAYS.     Note: Gram Stain Report Called to,Read Back By and Verified With: [name] 09/18/12 @ 1PM BY [name]   Report Status 09/20/2012 FINAL   Final  CULTURE, BLOOD (ROUTINE X 2)     Status: None   Collection Time    09/17/12  3:10 PM      Result Value Range Status   Specimen Description BLOOD ARM LEFT   Final   Special Requests BOTTLES DRAWN AEROBIC AND ANAEROBIC 10CC   Final   Culture  Setup Time 09/18/2012 00:44   Final   Culture     Final   Value: STAPHYLOCOCCUS AUREUS     Note: RIFAMPIN  AND GENTAMICIN SHOULD NOT BE USED AS SINGLE DRUGS FOR TREATMENT OF STAPH INFECTIONS. This organism DOES NOT demonstrate inducible Clindamycin resistance in vitro.     Note: Gram Stain Report Called to,Read Back By and Verified With: [name] 09/18/12 @ 1PM BY [name]   Report Status 09/20/2012 FINAL   Final   Organism ID, Bacteria STAPHYLOCOCCUS AUREUS   Final  URINE CULTURE     Status: None   Collection Time    09/17/12  6:10 PM      Result Value Range Status   Specimen Description URINE, CLEAN CATCH   Final   Special Requests vancomycin and menopenem   Final   Culture  Setup Time 09/17/2012 19:13   Final   Colony Count NO GROWTH   Final   Culture NO GROWTH   Final   Report Status 09/18/2012 FINAL   Final  MRSA PCR SCREENING     Status: None   Collection Time    09/17/12  6:51 PM      Result Value Range Status   MRSA by PCR NEGATIVE  NEGATIVE Final   Comment:            The GeneXpert MRSA Assay (FDA     approved for NASAL specimens     only), is one component of a     comprehensive MRSA colonization     surveillance program. It is not     intended to diagnose MRSA     infection nor to guide or     monitor treatment for     MRSA infections.  CULTURE, BLOOD (ROUTINE X 2)     Status: None   Collection Time    09/19/12  6:30 PM      Result Value Range Status   Specimen Description BLOOD LEFT ARM   Final   Special Requests BOTTLES DRAWN AEROBIC AND ANAEROBIC 10CC EACH   Final   Culture  Setup Time 09/20/2012 01:48   Final   Culture     Final   Value:        BLOOD CULTURE RECEIVED NO GROWTH TO DATE CULTURE WILL BE HELD FOR 5 DAYS BEFORE ISSUING A FINAL NEGATIVE REPORT   Report Status PENDING   Incomplete  CULTURE, BLOOD (ROUTINE X 2)     Status: None   Collection Time    09/19/12  6:40 PM      Result Value Range Status   Specimen Description BLOOD LEFT HAND   Final   Special Requests BOTTLES DRAWN AEROBIC AND ANAEROBIC 10CC  EACH   Final   Culture  Setup Time 09/20/2012  01:48   Final   Culture     Final   Value:        BLOOD CULTURE RECEIVED NO GROWTH TO DATE CULTURE WILL BE HELD FOR 5 DAYS BEFORE ISSUING A FINAL NEGATIVE REPORT   Report Status PENDING   Incomplete     Studies: No results found.  Scheduled Meds: . antiseptic oral rinse  15 mL Mouth Rinse BID  . aspirin  325 mg Oral Daily  .  ceFAZolin (ANCEF) IV  2 g Intravenous Q8H  . famotidine  20 mg Oral Daily  . FLUoxetine  20 mg Oral Daily  . furosemide  40 mg Oral Daily  . insulin aspart  0-15 Units Subcutaneous TID WC  . insulin aspart  0-5 Units Subcutaneous QHS  . levothyroxine  137 mcg Oral QAC breakfast  . metoprolol succinate  12.5 mg Oral Daily  . morphine  60 mg Oral Q12H  . senna-docusate  4 tablet Oral Daily  . sodium chloride  3 mL Intravenous Q12H   Continuous Infusions:    Principal Problem:   Staphylococcus aureus bacteremia Active Problems:   THROMBOCYTOPENIA   AORTIC STENOSIS   VENOUS INSUFFICIENCY   Cellulitis   PVD (peripheral vascular disease)   Morbid obesity   Lactic acidosis   Hypotension   Hyponatremia    Time spent: 35    [facility], [place]  [facility] Pager [phone]. If 7PM-7AM, please contact night-coverage at [email or web address], password [facility] 09/21/2012, 10:43 AM  LOS: 4 days

## 2012-09-21 NOTE — Progress Notes (Signed)
Peripherally Inserted Central Catheter/Midline Placement  The IV Nurse has discussed with the patient and/or persons authorized to consent for the patient, the purpose of this procedure and the potential benefits and risks involved with this procedure.  The benefits include less needle sticks, lab draws from the catheter and patient may be discharged home with the catheter.  Risks include, but not limited to, infection, bleeding, blood clot (thrombus formation), and puncture of an artery; nerve damage and irregular heat beat.  Alternatives to this procedure were also discussed.  PICC/Midline Placement Documentation        Lisabeth Devoid 09/21/2012, 1:49 PM Consent obtained by Stacie Glaze,, RN

## 2012-09-21 NOTE — Progress Notes (Signed)
OT Cancellation Note  Patient Details Name: [name] MRN: [id number] DOB: August 03, 1945   Cancelled Treatment:    Reason Eval/Treat Not Completed: Patient at procedure or test/ unavailable  Cleston Lautner 09/21/2012, 2:46 PM

## 2012-09-21 NOTE — Progress Notes (Signed)
Patient ID: [name], female   DOB: 11-22-1945, 67 y.o.   MRN: [id number]         [facility] for Infectious Disease    Date of Admission:  09/17/2012           Day 4 vancomycin  Principal Problem:   Staphylococcus aureus bacteremia Active Problems:   THROMBOCYTOPENIA   AORTIC STENOSIS   VENOUS INSUFFICIENCY   Cellulitis   PVD (peripheral vascular disease)   Morbid obesity   Lactic acidosis   Hypotension   Hyponatremia   . antiseptic oral rinse  15 mL Mouth Rinse BID  . aspirin  325 mg Oral Daily  .  ceFAZolin (ANCEF) IV  2 g Intravenous Q8H  . famotidine  20 mg Oral Daily  . FLUoxetine  20 mg Oral Daily  . [START ON 09/22/2012] furosemide  80 mg Oral Daily  . hydrocerin   Topical Daily  . insulin aspart  0-15 Units Subcutaneous TID WC  . insulin aspart  0-5 Units Subcutaneous QHS  . levothyroxine  137 mcg Oral QAC breakfast  . metoprolol succinate  12.5 mg Oral Daily  . morphine  60 mg Oral Q12H  . senna-docusate  4 tablet Oral Daily  . sodium chloride  3 mL Intravenous Q12H   Objective: Temp:  [98.4 F (36.9 C)-98.5 F (36.9 C)] 98.4 F (36.9 C) (07/29 0514) Pulse Rate:  [88-101] 88 (07/29 0952) Resp:  [18] 18 (07/29 0514) BP: (106-124)/(52-73) 106/54 mmHg (07/29 0952) SpO2:  [93 %-97 %] 97 % (07/29 0514)   Lab Results Lab Results  Component Value Date   WBC 5.1 09/19/2012   HGB 11.3* 09/19/2012   HCT 34.7* 09/19/2012   MCV 88.3 09/19/2012   PLT 51* 09/19/2012    Lab Results  Component Value Date   CREATININE 0.63 09/19/2012   BUN 16 09/19/2012   NA 130* 09/19/2012   K 4.1 09/19/2012   CL 98 09/19/2012   CO2 27 09/19/2012    Lab Results  Component Value Date   ALT 22 12/11/2011   AST 46* 12/11/2011   ALKPHOS 200* 12/11/2011   BILITOT 2.1* 12/11/2011      Microbiology: Recent Results (from the past 240 hour(s))  CULTURE, BLOOD (ROUTINE X 2)     Status: None   Collection Time    09/17/12  3:00 PM      Result Value Range Status   Specimen  Description BLOOD ARM RIGHT   Final   Special Requests BOTTLES DRAWN AEROBIC AND ANAEROBIC 10CC   Final   Culture  Setup Time 09/18/2012 00:41   Final   Culture     Final   Value: STAPHYLOCOCCUS AUREUS     Note: SUSCEPTIBILITIES PERFORMED ON PREVIOUS CULTURE WITHIN THE LAST 5 DAYS.     Note: Gram Stain Report Called to,Read Back By and Verified With: [name] 09/18/12 @ 1PM BY [name]   Report Status 09/20/2012 FINAL   Final  CULTURE, BLOOD (ROUTINE X 2)     Status: None   Collection Time    09/17/12  3:10 PM      Result Value Range Status   Specimen Description BLOOD ARM LEFT   Final   Special Requests BOTTLES DRAWN AEROBIC AND ANAEROBIC 10CC   Final   Culture  Setup Time 09/18/2012 00:44   Final   Culture     Final   Value: STAPHYLOCOCCUS AUREUS     Note: RIFAMPIN AND GENTAMICIN SHOULD NOT BE  USED AS SINGLE DRUGS FOR TREATMENT OF STAPH INFECTIONS. This organism DOES NOT demonstrate inducible Clindamycin resistance in vitro.     Note: Gram Stain Report Called to,Read Back By and Verified With: [name] 09/18/12 @ 1PM BY [name]   Report Status 09/20/2012 FINAL   Final   Organism ID, Bacteria STAPHYLOCOCCUS AUREUS   Final  URINE CULTURE     Status: None   Collection Time    09/17/12  6:10 PM      Result Value Range Status   Specimen Description URINE, CLEAN CATCH   Final   Special Requests vancomycin and menopenem   Final   Culture  Setup Time 09/17/2012 19:13   Final   Colony Count NO GROWTH   Final   Culture NO GROWTH   Final   Report Status 09/18/2012 FINAL   Final  MRSA PCR SCREENING     Status: None   Collection Time    09/17/12  6:51 PM      Result Value Range Status   MRSA by PCR NEGATIVE  NEGATIVE Final   Comment:            The GeneXpert MRSA Assay (FDA     approved for NASAL specimens     only), is one component of a     comprehensive MRSA colonization     surveillance program. It is not     intended to diagnose MRSA     infection nor to guide or      monitor treatment for     MRSA infections.  CULTURE, BLOOD (ROUTINE X 2)     Status: None   Collection Time    09/19/12  6:30 PM      Result Value Range Status   Specimen Description BLOOD LEFT ARM   Final   Special Requests BOTTLES DRAWN AEROBIC AND ANAEROBIC 10CC EACH   Final   Culture  Setup Time 09/20/2012 01:48   Final   Culture     Final   Value:        BLOOD CULTURE RECEIVED NO GROWTH TO DATE CULTURE WILL BE HELD FOR 5 DAYS BEFORE ISSUING A FINAL NEGATIVE REPORT   Report Status PENDING   Incomplete  CULTURE, BLOOD (ROUTINE X 2)     Status: None   Collection Time    09/19/12  6:40 PM      Result Value Range Status   Specimen Description BLOOD LEFT HAND   Final   Special Requests BOTTLES DRAWN AEROBIC AND ANAEROBIC 10CC EACH   Final   Culture  Setup Time 09/20/2012 01:48   Final   Culture     Final   Value:        BLOOD CULTURE RECEIVED NO GROWTH TO DATE CULTURE WILL BE HELD FOR 5 DAYS BEFORE ISSUING A FINAL NEGATIVE REPORT   Report Status PENDING   Incomplete   Assessment: I note that Dr. Burna Sis discussion with cardiology that Ms. Scholten is not a candidate for TEE. She is at high risk for endocarditis given her aortic stenosis so I will plan on 6 weeks of IV vancomycin and followup in my clinic. I agree with PICC placement as repeat blood cultures are negative.  Plan: 1. Continue vancomycin for 6 weeks 2. PICC placement  Cliffton Asters, MD [facility] Infect[facility] [phone] pager   [phone] cell 09/21/2012, 1:32 PM

## 2012-09-21 NOTE — Consult Note (Signed)
WOC consult Note Reason for Consult: Consult requested for right leg cellulitis.  Pt states she previously had blisters which evolved into dry scabs with cellulitis. Wound type: Full thickness Measurement: Scabbed area 12X2cm Wound bed: dry dark brown scabs to right calf. Drainage (amount, consistency, odor) No odor or drainage, no open wound. Periwound: Generalized edema and erythremia slowly improving since pt has been on IV antibiotics. Dressing procedure/placement/frequency: Eucerin cream to promote moist healing and assist with removal of nonviable tissue.  Legs should be washed Q day and roughly towel-dried, then cream applied and wrapped to retain moisture next to skin.  Discussed plan of care with pt and husband at bedside who appear to understand.  Please re-consult if further assistance is needed.  Thank-you,  Cammie Mcgee MSN, RN, CWOCN, [place], CNS [phone]

## 2012-09-21 NOTE — Evaluation (Signed)
Physical Therapy Evaluation Patient Details Name: [name] MRN: [id number] DOB: 03-23-1945 Today's Date: 09/21/2012 Time: 6578-4696 PT Time Calculation (min): 27 min  PT Assessment / Plan / Recommendation History of Present Illness  Pt is a 67 y.o. female with multiple medical problems.  She comes with a 2 month hx of increasing right leg swelling, redness and pain.  She has had issues with redness in the past but it went away without abx.  This time she also developed a fever.  Decreased appetite, decreased energy.  Blood sugars have also been increasing.   Clinical Impression  Patient demonstrates deficits in functional mobility as indicated below. Feel patient will benefit from skilled PT to address deficits and maximize function. Will continue to see as indicated and progress activity as tolerated. Rec HHPT upon discharge with family assist.    PT Assessment  Patient needs continued PT services    Follow Up Recommendations  Home health PT;Supervision/Assistance - 24 hour          Equipment Recommendations  None recommended by PT       Frequency Min 3X/week    Precautions / Restrictions Restrictions Weight Bearing Restrictions: No   Pertinent Vitals/Pain 8/10 pain reported during ambulation in bilateral LE, no evidence of distress, VSS      Mobility  Bed Mobility Bed Mobility: Sit to Supine;Scooting to HOB Sit to Supine: 4: Min assist Scooting to [facility]: 4: Min assist;With rail Details for Bed Mobility Assistance: Assist for LE elevation and positioning in bed Transfers Transfers: Sit to Stand;Stand to Sit;Stand Pivot Transfers Sit to Stand: 4: Min assist Stand to Sit: 4: Min assist Stand Pivot Transfers: 4: Min assist Details for Transfer Assistance: VCs for technique, rocker method with cues for hand placement and use of momentum to stand (Performed x5, 3 from chair, once from bed and once from 3in1) Ambulation/Gait Ambulation/Gait Assistance: 4: Min  guard Ambulation Distance (Feet): 50 Feet Assistive device: Rolling walker Ambulation/Gait Assistance Details: VCs for upright posture and positioning within rw. 2 seated rest breaks. Patient limited in distance by pain reported in bilateral LE and low back. Gait Pattern: Step-to pattern;Decreased stride length;Trunk flexed Gait velocity: decreased General Gait Details: steady with heavy reliance n rw. VCs for positioning within rw and cues to not abandon rw during functional activities such as approaching the sink Stairs: No        PT Diagnosis: Difficulty walking;Generalized weakness  PT Problem List: Decreased strength;Decreased range of motion;Decreased activity tolerance;Decreased balance;Decreased mobility;Decreased knowledge of use of DME PT Treatment Interventions: DME instruction;Gait training;Stair training;Functional mobility training;Therapeutic activities;Therapeutic exercise;Balance training;Patient/family education     PT Goals(Current goals can be found in the care plan section) Acute Rehab PT Goals Patient Stated Goal: to go home PT Goal Formulation: With patient Time For Goal Achievement: 10/05/12 Potential to Achieve Goals: Fair  Visit Information  Last PT Received On: 09/21/12 Assistance Needed: +1 History of Present Illness: Pt is a 67 y.o. female with multiple medical problems.  She comes with a 2 month hx of increasing right leg swelling, redness and pain.  She has had issues with redness in the past but it went away without abx.  This time she also developed a fever.  Decreased appetite, decreased energy.  Blood sugars have also been increasing.        Prior Functioning  Home Living Family/patient expects to be discharged to:: Private residence Living Arrangements: Spouse/significant other Available Help at Discharge: Family Type of Home: [facility]  Access: Stairs to enter [employer] of Steps: 5 in the front, 5-6 in the back  Entrance  Stairs-Rails: Right;Left (can't reach both of them at same time) Home Layout: One level Additional Comments: wheelchair is borrowed. has been using a maskshift ramp to get into/out of house, husband as been pushing pt in and out.  Prior Function Level of Independence: Needs assistance Comments: Has 2 cats, likes cards.  Communication Communication: No difficulties Dominant Hand: Right    Cognition  Cognition Arousal/Alertness: Awake/alert Behavior During Therapy: WFL for tasks assessed/performed Overall Cognitive Status: Within Functional Limits for tasks assessed    Extremity/Trunk Assessment Upper Extremity Assessment Upper Extremity Assessment: Defer to OT evaluation Lower Extremity Assessment Lower Extremity Assessment: Generalized weakness (Limited ROM secondary to body habitus)   Balance Balance Balance Assessed: Yes High Level Balance High Level Balance Activites: Side stepping;Backward walking;Direction changes;Turns High Level Balance Comments: minimal assistance required for navigation in tight area with high level balance activities  End of Session PT - End of Session Activity Tolerance: Patient limited by fatigue Patient left: in bed;with call bell/phone within reach;with family/visitor present Nurse Communication: Mobility status  GP     Fabio Asa 09/21/2012, 2:14 PM Charlotte Crumb, PT DPT  [phone]

## 2012-09-21 NOTE — Progress Notes (Deleted)
OT Cancellation Note  Patient Details Name: [name] MRN: [id number] DOB: 1945/03/31   Cancelled Treatment:    Reason Eval/Treat Not Completed: Pain limiting ability to participate (and c/o nausea).  Upon arrival. Patient had just completed a PT session and got back into bed.  Will attempt again tomorrow or again today as time allows.  Divine Hansley 09/21/2012, 2:42 PM

## 2012-09-21 NOTE — Care Management Note (Signed)
    Page 1 of 2   09/22/2012     10:53:37 AM   CARE MANAGEMENT NOTE 09/22/2012  Patient:  [name], [name]   Account Number:  [id number]  Date Initiated:  09/21/2012  Documentation initiated by:  GRAVES-BIGELOW,Lautaro Koral  Subjective/Objective Assessment:   Pt admitted for Cellulitis: IV ABX. Plan for PICC- not a candidate for TEE nor valve replacement/repair. Plan PICC with IV ABX for 6 wks per ID. Pt is from home with husband.     Action/Plan:   Pt will need HHRN for IV ABX administration. Pt chose [facility] for services. Will make referral once orders received. CM will continue to monitor for disposition needs.   Anticipated DC Date:  09/23/2012   Anticipated DC Plan:  HOME W HOME HEALTH SERVICES      DC Planning Services  CM consult      [facility]  HOME HEALTH   Choice offered to / List presented to:  C-1 Patient        HH arranged  HH-1 RN  HH-10 DISEASE MANAGEMENT  HH-2 PT  HH-4 NURSE'S AIDE  HH-6 SOCIAL WORKER      HH agency  [facility]   Status of service:  Completed, signed off Medicare Important Message given?   (If response is "NO", the following Medicare IM given date fields will be blank) Date Medicare IM given:   Date Additional Medicare IM given:    Discharge Disposition:  HOME W HOME HEALTH SERVICES  Per UR Regulation:  Reviewed for med. necessity/level of care/duration of stay  If discussed at Long Length of Stay Meetings, dates discussed:    Comments:  09-22-12 [address] [place], [place] [phone] CM was able to speak to pt's husband today in reference to [facility] services. Husband is agreeable to [facility], however he thinsk SNF would be better option- pt declining to go to SNF at this time. HH services needed will be for RN- for IV ABX teaching and managing picc line, PT for eval and treatment, Aide for bath and SW for assistance to get pt into SNF if needed. CM did make referral for services. MD to write orders/ [facility] will need Rx for IV ABX  therapy. No further needs from CM at this time.

## 2012-09-22 DIAGNOSIS — M129 Arthropathy, unspecified: Secondary | ICD-10-CM

## 2012-09-22 DIAGNOSIS — I509 Heart failure, unspecified: Secondary | ICD-10-CM

## 2012-09-22 DIAGNOSIS — I5033 Acute on chronic diastolic (congestive) heart failure: Secondary | ICD-10-CM

## 2012-09-22 LAB — CBC
HCT: 39.5 % (ref 36.0–46.0)
MCHC: 32.9 g/dL (ref 30.0–36.0)
Platelets: 99 10*3/uL — ABNORMAL LOW (ref 150–400)
RDW: 16 % — ABNORMAL HIGH (ref 11.5–15.5)
WBC: 15.1 10*3/uL — ABNORMAL HIGH (ref 4.0–10.5)

## 2012-09-22 LAB — BASIC METABOLIC PANEL
Chloride: 101 mEq/L (ref 96–112)
GFR calc Af Amer: >90 mL/min (ref >90)
GFR calc non Af Amer: 89 mL/min — ABNORMAL LOW (ref >90)
Potassium: 4.3 mEq/L (ref 3.5–5.1)
Sodium: 137 mEq/L (ref 135–145)

## 2012-09-22 LAB — GLUCOSE, CAPILLARY
Glucose-Capillary: 187 mg/dL — ABNORMAL HIGH (ref 70–99)
Glucose-Capillary: 161 mg/dL — ABNORMAL HIGH (ref 70–99)

## 2012-09-22 MED ORDER — CEFAZOLIN SODIUM-DEXTROSE 2-3 GM-% IV SOLR
2.0000 g | Freq: Three times a day (TID) | INTRAVENOUS | Status: DC
  Administered 2012-09-22 – 2012-09-23 (×3): 2 g via INTRAVENOUS
  Filled 2012-09-22 (×6): qty 50

## 2012-09-22 MED ORDER — VANCOMYCIN HCL 10 G IV SOLR
1250.0000 mg | Freq: Two times a day (BID) | INTRAVENOUS | Status: DC
  Filled 2012-09-22 (×2): qty 1250

## 2012-09-22 MED ORDER — MORPHINE SULFATE 15 MG PO TABS
15.0000 mg | ORAL_TABLET | Freq: Three times a day (TID) | ORAL | Status: DC | PRN
  Administered 2012-09-23: 15 mg via ORAL
  Filled 2012-09-22: qty 1

## 2012-09-22 MED ORDER — FUROSEMIDE 40 MG PO TABS
40.0000 mg | ORAL_TABLET | Freq: Every evening | ORAL | Status: DC
  Administered 2012-09-22: 40 mg via ORAL
  Filled 2012-09-22 (×2): qty 1

## 2012-09-22 NOTE — Evaluation (Signed)
Occupational Therapy Evaluation Patient Details Name: [name] MRN: [id number] DOB: 09-28-1945 Today's Date: 09/22/2012 Time: 1340-1404 OT Time Calculation (min): 24 min  OT Assessment / Plan / Recommendation History of present illness Pt is a 67 y.o. female with multiple medical problems.  She comes with a 2 month hx of increasing right leg swelling, redness and pain.  She has had issues with redness in the past but it went away without abx.  This time she also developed a fever.  Decreased appetite, decreased energy.  Blood sugars have also been increasing.    Clinical Impression   Pt admitted with above. Will continue to follow pt acutely in order to address below problem list in prep for return home with spouse and 24/7 assist.     OT Assessment  Patient needs continued OT Services    Follow Up Recommendations  Home health OT;Supervision/Assistance - 24 hour    Barriers to Discharge      Equipment Recommendations  3 in 1 bedside comode;Tub/shower bench    Recommendations for Other Services    Frequency  Min 2X/week    Precautions / Restrictions Restrictions Weight Bearing Restrictions: No   Pertinent Vitals/Pain See vitals    ADL  Grooming: Performed;Wash/dry hands;Wash/dry face;Set up Where Assessed - Grooming: Unsupported sitting Upper Body Bathing: Simulated;Set up Where Assessed - Upper Body Bathing: Unsupported sitting Lower Body Bathing: Simulated;Moderate assistance Where Assessed - Lower Body Bathing: Supported sit to stand Upper Body Dressing: Performed;Set up Where Assessed - Upper Body Dressing: Unsupported sitting Lower Body Dressing: Performed;Maximal assistance Where Assessed - Lower Body Dressing: Supported sit to stand Toilet Transfer: Performed;Minimal assistance Toilet Transfer Method: Sit to stand;Stand pivot Acupuncturist: Materials engineer and Hygiene: Performed;Min guard Where Assessed -  Engineer, mining and Hygiene: Sit to stand from 3-in-1 or toilet Equipment Used: Rolling walker;Gait belt Transfers/Ambulation Related to ADLs: min guard with RW ADL Comments: Pt requires max encouragement to participate in ADL activity and therapeutic activity with PT/OT. At end of session, pt very appreciative for encouragement and states she know she just needs to push herself.    OT Diagnosis: Generalized weakness  OT Problem List: Decreased strength;Decreased activity tolerance;Impaired balance (sitting and/or standing);Decreased knowledge of use of DME or AE;Obesity OT Treatment Interventions: Self-care/ADL training;DME and/or AE instruction;Therapeutic activities;Patient/family education;Balance training   OT Goals(Current goals can be found in the care plan section) Acute Rehab OT Goals Patient Stated Goal: to go home OT Goal Formulation: With patient Time For Goal Achievement: 10/06/12 Potential to Achieve Goals: Good  Visit Information  Last OT Received On: 09/22/12 Assistance Needed: +1 History of Present Illness: Pt is a 67 y.o. female with multiple medical problems.  She comes with a 2 month hx of increasing right leg swelling, redness and pain.  She has had issues with redness in the past but it went away without abx.  This time she also developed a fever.  Decreased appetite, decreased energy.  Blood sugars have also been increasing.        Prior Functioning     Home Living Family/patient expects to be discharged to:: Private residence Living Arrangements: Spouse/significant other Available Help at Discharge: Family Type of Home: House Home Access: Stairs to enter [employer] of Steps: 5 in the front, 5-6 in the back  Entrance Stairs-Rails: Right;Left Home Layout: One level Home Equipment:  (rollator) Prior Function Level of Independence: Needs assistance ADL's / Homemaking Assistance Needed: Husband assists with  ADLs, however did not  specify level of assist.         Vision/Perception     Cognition  Cognition Arousal/Alertness: Awake/alert Behavior During Therapy: WFL for tasks assessed/performed Overall Cognitive Status: Within Functional Limits for tasks assessed    Extremity/Trunk Assessment Upper Extremity Assessment Upper Extremity Assessment: Overall WFL for tasks assessed     Mobility Bed Mobility Bed Mobility: Supine to Sit;Sitting - Scoot to Edge of Bed Supine to Sit: 4: Min guard;With rails;HOB elevated Sitting - Scoot to Edge of Bed: 6: Modified independent (Device/Increase time) Transfers Transfers: Sit to Stand;Stand to Sit Sit to Stand: 4: Min assist;From chair/3-in-1;From bed;With upper extremity assist Stand to Sit: 4: Min guard;To chair/3-in-1;With armrests;With upper extremity assist Details for Transfer Assistance: VCs for safe hand placement and to control descent.     Exercise     Balance     End of Session OT - End of Session Equipment Utilized During Treatment: Gait belt;Rolling walker;Oxygen Activity Tolerance: Patient tolerated treatment well Patient left: in chair;with call bell/phone within reach;with family/visitor present Nurse Communication: Mobility status  GO    09/22/2012 Cipriano Mile OTR/L Pager [phone] Office [phone]  Cipriano Mile 09/22/2012, 3:00 PM

## 2012-09-22 NOTE — Progress Notes (Signed)
ANTIBIOTIC CONSULT NOTE - INITIAL  Pharmacy Consult:  Vancomycin (Day#1, Abx Day#6) Indication:  MSSA bacteremia / Cellulitis  Allergies  Allergen Reactions  . Latex     REACTION: itch  . Penicillins     REACTION: hives    Patient Measurements: Height: 5' (152.4 cm) Weight: 210 lb 9.6 oz (95.528 kg) IBW/kg (Calculated) : 45.5  Vital Signs: Temp: 99 F (37.2 C) (07/30 0604) Temp src: Oral (07/30 0604) BP: 115/73 mmHg (07/30 1007) Pulse Rate: 105 (07/30 1007) Intake/Output from previous day: 07/29 0701 - 07/30 0700 In: 520 [P.O.:520] Out: 1100 [Urine:1100] Intake/Output from this shift: Total I/O In: -  Out: 400 [Urine:400]  Labs:  Recent Labs  09/22/12 0500  WBC 15.1*  HGB 13.0  PLT 99*  CREATININE 0.69   Estimated Creatinine Clearance: 71.5 ml/min (by C-G formula based on Cr of 0.69). No results found for this basename: VANCOTROUGH, Leodis Binet, VANCORANDOM, GENTTROUGH, GENTPEAK, GENTRANDOM, TOBRATROUGH, TOBRAPEAK, TOBRARND, AMIKACINPEAK, AMIKACINTROU, AMIKACIN,  in the last 72 hours   Microbiology: Recent Results (from the past 720 hour(s))  CULTURE, BLOOD (ROUTINE X 2)     Status: None   Collection Time    09/17/12  3:00 PM      Result Value Range Status   Specimen Description BLOOD ARM RIGHT   Final   Special Requests BOTTLES DRAWN AEROBIC AND ANAEROBIC 10CC   Final   Culture  Setup Time 09/18/2012 00:41   Final   Culture     Final   Value: STAPHYLOCOCCUS AUREUS     Note: SUSCEPTIBILITIES PERFORMED ON PREVIOUS CULTURE WITHIN THE LAST 5 DAYS.     Note: Gram Stain Report Called to,Read Back By and Verified With: [name] 09/18/12 @ 1PM BY [name]   Report Status 09/20/2012 FINAL   Final  CULTURE, BLOOD (ROUTINE X 2)     Status: None   Collection Time    09/17/12  3:10 PM      Result Value Range Status   Specimen Description BLOOD ARM LEFT   Final   Special Requests BOTTLES DRAWN AEROBIC AND ANAEROBIC 10CC   Final   Culture  Setup Time 09/18/2012  00:44   Final   Culture     Final   Value: STAPHYLOCOCCUS AUREUS     Note: RIFAMPIN AND GENTAMICIN SHOULD NOT BE USED AS SINGLE DRUGS FOR TREATMENT OF STAPH INFECTIONS. This organism DOES NOT demonstrate inducible Clindamycin resistance in vitro.     Note: Gram Stain Report Called to,Read Back By and Verified With: [name] 09/18/12 @ 1PM BY [name]   Report Status 09/20/2012 FINAL   Final   Organism ID, Bacteria STAPHYLOCOCCUS AUREUS   Final  URINE CULTURE     Status: None   Collection Time    09/17/12  6:10 PM      Result Value Range Status   Specimen Description URINE, CLEAN CATCH   Final   Special Requests vancomycin and menopenem   Final   Culture  Setup Time 09/17/2012 19:13   Final   Colony Count NO GROWTH   Final   Culture NO GROWTH   Final   Report Status 09/18/2012 FINAL   Final  MRSA PCR SCREENING     Status: None   Collection Time    09/17/12  6:51 PM      Result Value Range Status   MRSA by PCR NEGATIVE  NEGATIVE Final   Comment:  The GeneXpert MRSA Assay (FDA     approved for NASAL specimens     only), is one component of a     comprehensive MRSA colonization     surveillance program. It is not     intended to diagnose MRSA     infection nor to guide or     monitor treatment for     MRSA infections.  CULTURE, BLOOD (ROUTINE X 2)     Status: None   Collection Time    09/19/12  6:30 PM      Result Value Range Status   Specimen Description BLOOD LEFT ARM   Final   Special Requests BOTTLES DRAWN AEROBIC AND ANAEROBIC 10CC EACH   Final   Culture  Setup Time 09/20/2012 01:48   Final   Culture     Final   Value:        BLOOD CULTURE RECEIVED NO GROWTH TO DATE CULTURE WILL BE HELD FOR 5 DAYS BEFORE ISSUING A FINAL NEGATIVE REPORT   Report Status PENDING   Incomplete  CULTURE, BLOOD (ROUTINE X 2)     Status: None   Collection Time    09/19/12  6:40 PM      Result Value Range Status   Specimen Description BLOOD LEFT HAND   Final   Special Requests  BOTTLES DRAWN AEROBIC AND ANAEROBIC 10CC EACH   Final   Culture  Setup Time 09/20/2012 01:48   Final   Culture     Final   Value:        BLOOD CULTURE RECEIVED NO GROWTH TO DATE CULTURE WILL BE HELD FOR 5 DAYS BEFORE ISSUING A FINAL NEGATIVE REPORT   Report Status PENDING   Incomplete    Medical History: Past Medical History  Diagnosis Date  . Carotid bruit     0-39% bilateral ICA by dopplers 03/2010  . Aortic stenosis     a. Moderate echo 01/2009, then severe 03/2010 --> last echo 09/2011:  EF 60-65%, grade 2 diastolic dysfunction, severe aortic stenosis, mean gradient 50, mild MR    b. Not an operative candidate due to comorbidities, not a candidate for TAVR given PVD. c. low risk myovue 11/2007 brest attenuation EF  63%  . Venous insufficiency   . Arthritis   . Depression   . COPD (chronic obstructive pulmonary disease)   . Hypothyroidism   . Thrombocytopenia   . Hypokalemia   . Edema   . Respiratory failure     Ventilator-dependent respiratory failure secondary to hypercarbia, cardiac arrest, presumed penumonia 12/2008  . Cardiac arrest     12/2010 in setting of respiratory failure with less than 15 minutes of CPR; initial rhythm was asystole  . PVD (peripheral vascular disease)     Eval by Dr. Excell Seltzer 2012: noninvasive immaging suggested significant aortoiliac disease, ABIs in mild range  - med rx given significant comorbidities  . H/O endoscopy     For ?ampullary mass - had EGD 07/2010 with no evidence of ampullary mass, some CBD dilitation   . Chronic pain   . Lumbar stenosis     Admitted 09/2011 - not felt to be a good candidate for surgical intervention.  . CHF (congestive heart failure)   . Morbid obesity 10/27/2011  . Chronic diastolic CHF (congestive heart failure) 10/27/2011  . Lower extremity weakness 10/26/2011  . Spinal stenosis of lumbar region 10/25/2011  . ARTHRITIS 02/06/2009    Qualifier: Diagnosis of  By: Kem Parkinson    .  COPD 02/06/2009    Qualifier: Diagnosis  of  By: Kem Parkinson    . VENOUS INSUFFICIENCY 02/06/2009    Qualifier: Diagnosis of  By: Kem Parkinson    . Diabetes mellitus without complication   . Hypertension        Assessment: 13 YOF with cellulitis and MSSA bacteremia to resume vancomycin.  Unsure why therapy is changed from Ancef back to vancomycin given culture and sensitivity.  Patient's renal function has been stable.  Noted patient is not a candidate for TEE and ID plans to treat for 6 weeks.  Repeat blood culture is no growth to date.  Merrem 7/25 >> 7/27 Vanc 7/25 >> 7/28, restart 7/30 >> Ancef 7/28 >> 7/30  7/25 blood cx x 2 - MSSA 7/25 Urine Cx - negative 7/27 blood cx - NGTD   Goal of Therapy:  Vancomycin trough level 15-20 mcg/ml   Plan:  - Vanc 1250mg  IV Q12H - Monitor renal fxn, vanc trough at Css - Consider resuming home med metformin for better glycemic control    Dalaney Needle D. Laney Potash, PharmD, BCPS Pager:  [phone] 09/22/2012, 10:30 AM

## 2012-09-22 NOTE — Progress Notes (Signed)
Patient ID: [name]  female  YNW:[id number]    DOB: [birth year]    DOA: 09/17/2012  PCP: [name], MD  Assessment/Plan: Principal Problem:   Staphylococcus aureus bacteremia - PICC line placed, on Ancef for 6 weeks per pharmacy  - Dr. Benjamine Mola discussed with cardiology, she is not a candidate for TEE nor valve replacement/repair- was on palliative care at one point  Cellulitis with significant edema - Improving, wound care following, continue Ancef for 6 weeks, infectious disease, Dr. Orvan Falconer following - Increase Lasix, patient takes 80 mg Lasix a.m. and 40 mg p.m. -Decrease morphine And DC Percocet. Discussed with patient's husband, she takes MS Contin 60 mg every 12 hours but takes MSIR sparingly when needed.   Sepsis with lactic acidosis: Improved   Thrombocytopenia: Likely secondary to severe sepsis,  Now improving    PVD (peripheral vascular disease)    Morbid obesity    Hypotension- stable    Hyponatremia-resolved   DVT Prophylaxis:  Code Status:  Disposition:Foley tomorrow, discussed with patient and her husband in detail, she does not want to skilled nursing facility. Will arrange home health PT, RN for IV antibiotics.    Subjective: Feels better  Objective: Weight change:   Intake/Output Summary (Last 24 hours) at 09/22/12 1449 Last data filed at 09/22/12 0854  Gross per 24 hour  Intake    200 ml  Output    700 ml  Net   -500 ml   Blood pressure 115/73, pulse 105, temperature 99 F (37.2 C), temperature source Oral, resp. rate 20, height 5' (1.524 m), weight 95.528 kg (210 lb 9.6 oz), SpO2 91.00%.  Physical Exam: General: Alert and awake, oriented x3, not in any acute distress. HEENT: anicteric sclera, PERLA, EOMI CVS: S1-S2 clear, no murmur rubs or gallops Chest: clear to auscultation bilaterally, no wheezing, rales or rhonchi Abdomen:Morbidly obese, soft nontender, nondistended, normal bowel sounds  Extremities: no cyanosis, clubbing,  2-3+ edema noted bilaterally   Lab Results: Basic Metabolic Panel:  Recent Labs Lab 09/19/12 1017 09/22/12 0500  NA 130* 137  K 4.1 4.3  CL 98 101  CO2 27 29  GLUCOSE 176* 210*  BUN 16 15  CREATININE 0.63 0.69  CALCIUM 8.5 8.3*   CBC:  Recent Labs Lab 09/17/12 1458  09/19/12 1017 09/22/12 0500  WBC 9.4  < > 5.1 15.1*  NEUTROABS 8.8*  --   --   --   HGB 13.0  < > 11.3* 13.0  HCT 39.6  < > 34.7* 39.5  MCV 88.0  < > 88.3 89.8  PLT 68*  < > 51* 99*  < > = values in this interval not displayed. CBG:  Recent Labs Lab 09/21/12 1210 09/21/12 1639 09/21/12 2221 09/22/12 0723 09/22/12 1111  GLUCAP 169* 220* 157* 194* 187*     Micro Results: Recent Results (from the past 240 hour(s))  CULTURE, BLOOD (ROUTINE X 2)     Status: None   Collection Time    09/17/12  3:00 PM      Result Value Range Status   Specimen Description BLOOD ARM RIGHT   Final   Special Requests BOTTLES DRAWN AEROBIC AND ANAEROBIC 10CC   Final   Culture  Setup Time 09/18/2012 00:41   Final   Culture     Final   Value: STAPHYLOCOCCUS AUREUS     Note: SUSCEPTIBILITIES PERFORMED ON PREVIOUS CULTURE WITHIN THE LAST 5 DAYS.     Note: Gram Stain Report Called to,Read Back By  and Verified With: [name] 09/18/12 @ 1PM BY [name]   Report Status 09/20/2012 FINAL   Final  CULTURE, BLOOD (ROUTINE X 2)     Status: None   Collection Time    09/17/12  3:10 PM      Result Value Range Status   Specimen Description BLOOD ARM LEFT   Final   Special Requests BOTTLES DRAWN AEROBIC AND ANAEROBIC 10CC   Final   Culture  Setup Time 09/18/2012 00:44   Final   Culture     Final   Value: STAPHYLOCOCCUS AUREUS     Note: RIFAMPIN AND GENTAMICIN SHOULD NOT BE USED AS SINGLE DRUGS FOR TREATMENT OF STAPH INFECTIONS. This organism DOES NOT demonstrate inducible Clindamycin resistance in vitro.     Note: Gram Stain Report Called to,Read Back By and Verified With: [name] 09/18/12 @ 1PM BY [name]   Report  Status 09/20/2012 FINAL   Final   Organism ID, Bacteria STAPHYLOCOCCUS AUREUS   Final  URINE CULTURE     Status: None   Collection Time    09/17/12  6:10 PM      Result Value Range Status   Specimen Description URINE, CLEAN CATCH   Final   Special Requests vancomycin and menopenem   Final   Culture  Setup Time 09/17/2012 19:13   Final   Colony Count NO GROWTH   Final   Culture NO GROWTH   Final   Report Status 09/18/2012 FINAL   Final  MRSA PCR SCREENING     Status: None   Collection Time    09/17/12  6:51 PM      Result Value Range Status   MRSA by PCR NEGATIVE  NEGATIVE Final   Comment:            The GeneXpert MRSA Assay (FDA     approved for NASAL specimens     only), is one component of a     comprehensive MRSA colonization     surveillance program. It is not     intended to diagnose MRSA     infection nor to guide or     monitor treatment for     MRSA infections.  CULTURE, BLOOD (ROUTINE X 2)     Status: None   Collection Time    09/19/12  6:30 PM      Result Value Range Status   Specimen Description BLOOD LEFT ARM   Final   Special Requests BOTTLES DRAWN AEROBIC AND ANAEROBIC 10CC EACH   Final   Culture  Setup Time 09/20/2012 01:48   Final   Culture     Final   Value:        BLOOD CULTURE RECEIVED NO GROWTH TO DATE CULTURE WILL BE HELD FOR 5 DAYS BEFORE ISSUING A FINAL NEGATIVE REPORT   Report Status PENDING   Incomplete  CULTURE, BLOOD (ROUTINE X 2)     Status: None   Collection Time    09/19/12  6:40 PM      Result Value Range Status   Specimen Description BLOOD LEFT HAND   Final   Special Requests BOTTLES DRAWN AEROBIC AND ANAEROBIC 10CC EACH   Final   Culture  Setup Time 09/20/2012 01:48   Final   Culture     Final   Value:        BLOOD CULTURE RECEIVED NO GROWTH TO DATE CULTURE WILL BE HELD FOR 5 DAYS BEFORE ISSUING A FINAL NEGATIVE REPORT  Report Status PENDING   Incomplete    Studies/Results: Dg Tibia/fibula Right  09/17/2012   *RADIOLOGY REPORT*   Clinical Data: Lower leg pain, swelling and erythema extending into the foot for 3 weeks.  RIGHT TIBIA AND FIBULA - 2 VIEW  Comparison: Right knee MRI 10/31/2007.  Findings: The mineralization and alignment are normal.  There is no evidence of acute fracture, dislocation or bone destruction.  There is mildly progressive medial joint space loss at the knee.  The soft tissues of the lower leg are diffusely prominent with suspected generalized subcutaneous edema.  There is no evidence of foreign body or soft tissue emphysema.  IMPRESSION: Suspected diffuse lower leg soft tissue edema suggestive of cellulitis in this clinical context.  No soft tissue emphysema, foreign body or osseous abnormality identified.   Original Report Authenticated By: Carey Bullocks, M.D.   Dg Chest Port 1 View  09/17/2012   *RADIOLOGY REPORT*  Clinical Data: Cough and congestion.  PORTABLE CHEST - 1 VIEW  Comparison: 12/12/2011  Findings: Semi upright view of the chest demonstrates low lung volumes. There are prominent interstitial lung markings.  Heart size is upper limits of normal.  Trachea is midline.  Previous right shoulder surgery.  IMPRESSION: Low lung volumes.  Low lung volumes may be accentuating the size of the heart and interstitial lung markings.  Difficult to exclude mild edema.   Original Report Authenticated By: Richarda Overlie, M.D.   Dg Foot Complete Right  09/17/2012   *RADIOLOGY REPORT*  Clinical Data: Lower leg pain, swelling and erythema extending into the foot for 3 weeks.  RIGHT FOOT COMPLETE - 3+ VIEW  Comparison: None.  Findings: Within the foot, the bones appear mildly demineralized. There is no evidence of acute fracture, dislocation or bone destruction.  The soft tissues appear diffusely prominent, especially in the dorsum of the forefoot.  There is no evidence of foreign body or soft tissue emphysema.  IMPRESSION: Diffuse soft tissue prominence suspicious for cellulitis in this clinical context.  No evidence of  foreign body or osteomyelitis.   Original Report Authenticated By: Carey Bullocks, M.D.    Medications: Scheduled Meds: . antiseptic oral rinse  15 mL Mouth Rinse BID  . aspirin  325 mg Oral Daily  .  ceFAZolin (ANCEF) IV  2 g Intravenous Q8H  . famotidine  20 mg Oral Daily  . FLUoxetine  20 mg Oral Daily  . furosemide  40 mg Oral QPM  . furosemide  80 mg Oral Daily  . hydrocerin   Topical Daily  . insulin aspart  0-15 Units Subcutaneous TID WC  . insulin aspart  0-5 Units Subcutaneous QHS  . levothyroxine  137 mcg Oral QAC breakfast  . metoprolol succinate  12.5 mg Oral Daily  . morphine  60 mg Oral Q12H  . senna-docusate  4 tablet Oral Daily  . sodium chloride  3 mL Intravenous Q12H      LOS: 5 days   Davell Beckstead M.D. [facility] 09/22/2012, 2:49 PM Pager: [phone]  If 7PM-7AM, please contact night-coverage [email or web address] Password [email or web address]

## 2012-09-22 NOTE — Progress Notes (Signed)
Physical Therapy Treatment Patient Details Name: [name] MRN: [id number] DOB: November 04, 1945 Today's Date: 09/22/2012 Time: 9147-8295 PT Time Calculation (min): 24 min  PT Assessment / Plan / Recommendation  History of Present Illness Pt is a 67 y.o. female with multiple medical problems.  She comes with a 2 month hx of increasing right leg swelling, redness and pain.  She has had issues with redness in the past but it went away without abx.  This time she also developed a fever.  Decreased appetite, decreased energy.  Blood sugars have also been increasing.    PT Comments   Pt sleeping upon arrival & lethargic initially but improved with activity.  Pt able to increase ambulation short amount but feel she may not ambulate far distances at baseline per how husband was speaking.     Follow Up Recommendations  Home health PT;Supervision/Assistance - 24 hour     Does the patient have the potential to tolerate intense rehabilitation     Barriers to Discharge        Equipment Recommendations  None recommended by PT    Recommendations for Other Services    Frequency Min 3X/week   Progress towards PT Goals Progress towards PT goals: Progressing toward goals  Plan Current plan remains appropriate    Precautions / Restrictions Restrictions Weight Bearing Restrictions: No       Mobility  Bed Mobility Bed Mobility: Supine to Sit;Sitting - Scoot to Edge of Bed Supine to Sit: 4: Min guard;HOB elevated Sitting - Scoot to Edge of Bed: 6: Modified independent (Device/Increase time) Transfers Transfers: Sit to Stand;Stand to Sit Sit to Stand: 4: Min assist;With upper extremity assist;With armrests;From bed;From chair/3-in-1 Stand to Sit: 4: Min guard;With upper extremity assist;With armrests;To chair/3-in-1 Details for Transfer Assistance: cues for hand placement & controlled descent.   Ambulation/Gait Ambulation/Gait Assistance: 4: Min guard Ambulation Distance (Feet): 75 Feet (50' +  25' ) Assistive device: Rolling walker Ambulation/Gait Assistance Details: Pt c/o fatigue & LE weakness.  Recliner followed behind for safety.  Pt required seated rest break to complete distance.  Cues for posture & to stay closer to RW.   Gait Pattern: Step-through pattern;Decreased stride length;Trunk flexed Gait velocity: decreased General Gait Details: steady with heavy reliance n rw. VCs for positioning within rw and cues to not abandon rw during functional activities such as approaching the sink Stairs: No Wheelchair Mobility Wheelchair Mobility: No      PT Goals (current goals can now be found in the care plan section) Acute Rehab PT Goals Patient Stated Goal: to go home PT Goal Formulation: With patient Time For Goal Achievement: 10/05/12 Potential to Achieve Goals: Fair  Visit Information  Last PT Received On: 09/22/12 Assistance Needed: +1 History of Present Illness: Pt is a 67 y.o. female with multiple medical problems.  She comes with a 2 month hx of increasing right leg swelling, redness and pain.  She has had issues with redness in the past but it went away without abx.  This time she also developed a fever.  Decreased appetite, decreased energy.  Blood sugars have also been increasing.     Subjective Data  Patient Stated Goal: to go home   Cognition  Cognition Arousal/Alertness: Awake/alert Behavior During Therapy: WFL for tasks assessed/performed Overall Cognitive Status: Within Functional Limits for tasks assessed    Balance     End of Session PT - End of Session Equipment Utilized During Treatment: Oxygen Activity Tolerance: Patient limited by fatigue Patient  left: in chair;with call bell/phone within reach;with family/visitor present Nurse Communication: Mobility status     Verdell Face, [place] [phone] 09/22/2012

## 2012-09-22 NOTE — Progress Notes (Signed)
Patient ID: [name], female   DOB: November 22, 1945, 67 y.o.   MRN: [id number]         [facility] for Infectious Disease    Date of Admission:  09/17/2012   Total days of antibiotics 5         Principal Problem:   Staphylococcus aureus bacteremia Active Problems:   THROMBOCYTOPENIA   AORTIC STENOSIS   VENOUS INSUFFICIENCY   Cellulitis   PVD (peripheral vascular disease)   Morbid obesity   Lactic acidosis   Hypotension   Hyponatremia   . antiseptic oral rinse  15 mL Mouth Rinse BID  . aspirin  325 mg Oral Daily  .  ceFAZolin (ANCEF) IV  2 g Intravenous Q8H  . famotidine  20 mg Oral Daily  . FLUoxetine  20 mg Oral Daily  . furosemide  40 mg Oral QPM  . furosemide  80 mg Oral Daily  . hydrocerin   Topical Daily  . insulin aspart  0-15 Units Subcutaneous TID WC  . insulin aspart  0-5 Units Subcutaneous QHS  . levothyroxine  137 mcg Oral QAC breakfast  . metoprolol succinate  12.5 mg Oral Daily  . morphine  60 mg Oral Q12H  . senna-docusate  4 tablet Oral Daily  . sodium chloride  3 mL Intravenous Q12H    Subjective: She is feeling much better. She still has some mild throbbing pain in her right leg when she is seated but it is improving.   Past Medical History  Diagnosis Date  . Carotid bruit     0-39% bilateral ICA by dopplers 03/2010  . Aortic stenosis     a. Moderate echo 01/2009, then severe 03/2010 --> last echo 09/2011:  EF 60-65%, grade 2 diastolic dysfunction, severe aortic stenosis, mean gradient 50, mild MR    b. Not an operative candidate due to comorbidities, not a candidate for TAVR given PVD. c. low risk myovue 11/2007 brest attenuation EF  63%  . Venous insufficiency   . Arthritis   . Depression   . COPD (chronic obstructive pulmonary disease)   . Hypothyroidism   . Thrombocytopenia   . Hypokalemia   . Edema   . Respiratory failure     Ventilator-dependent respiratory failure secondary to hypercarbia, cardiac arrest, presumed penumonia 12/2008    . Cardiac arrest     12/2010 in setting of respiratory failure with less than 15 minutes of CPR; initial rhythm was asystole  . PVD (peripheral vascular disease)     Eval by Dr. Excell Seltzer 2012: noninvasive immaging suggested significant aortoiliac disease, ABIs in mild range  - med rx given significant comorbidities  . H/O endoscopy     For ?ampullary mass - had EGD 07/2010 with no evidence of ampullary mass, some CBD dilitation   . Chronic pain   . Lumbar stenosis     Admitted 09/2011 - not felt to be a good candidate for surgical intervention.  . CHF (congestive heart failure)   . Morbid obesity 10/27/2011  . Chronic diastolic CHF (congestive heart failure) 10/27/2011  . Lower extremity weakness 10/26/2011  . Spinal stenosis of lumbar region 10/25/2011  . ARTHRITIS 02/06/2009    Qualifier: Diagnosis of  By: Kem Parkinson    . COPD 02/06/2009    Qualifier: Diagnosis of  By: Kem Parkinson    . VENOUS INSUFFICIENCY 02/06/2009    Qualifier: Diagnosis of  By: Kem Parkinson    . Diabetes mellitus without complication   . Hypertension  History  Substance Use Topics  . Smoking status: Current Every Day Smoker -- 2.00 packs/day for 45 years    Types: Cigarettes  . Smokeless tobacco: Current User     Comment: PT USES VAPOR CIGARETTE  . Alcohol Use: No    Family History  Problem Relation Age of Onset  . Emphysema Father   . Allergies Sister   . Asthma Sister   . Heart disease Father   . Cancer Mother     Allergies  Allergen Reactions  . Latex     REACTION: itch  . Penicillins     REACTION: hives    Objective: Temp:  [98.4 F (36.9 C)-99 F (37.2 C)] 98.8 F (37.1 C) (07/30 1434) Pulse Rate:  [88-110] 88 (07/30 1434) Resp:  [20] 20 (07/30 1434) BP: (115-127)/(52-73) 118/69 mmHg (07/30 1434) SpO2:  [91 %-92 %] 92 % (07/30 1434)  General: She is looking much better Skin: New right arm PICC Lungs: Clear Cor: No change in heart 2/6 systolic murmur Abdomen:  Obese, soft and nontender Right leg: Acute cellulitis is nearly completely resolved  Lab Results Lab Results  Component Value Date   WBC 15.1* 09/22/2012   HGB 13.0 09/22/2012   HCT 39.5 09/22/2012   MCV 89.8 09/22/2012   PLT 99* 09/22/2012    Lab Results  Component Value Date   CREATININE 0.69 09/22/2012   BUN 15 09/22/2012   NA 137 09/22/2012   K 4.3 09/22/2012   CL 101 09/22/2012   CO2 29 09/22/2012    Lab Results  Component Value Date   ALT 22 12/11/2011   AST 46* 12/11/2011   ALKPHOS 200* 12/11/2011   BILITOT 2.1* 12/11/2011      Microbiology: Recent Results (from the past 240 hour(s))  CULTURE, BLOOD (ROUTINE X 2)     Status: None   Collection Time    09/17/12  3:00 PM      Result Value Range Status   Specimen Description BLOOD ARM RIGHT   Final   Special Requests BOTTLES DRAWN AEROBIC AND ANAEROBIC 10CC   Final   Culture  Setup Time 09/18/2012 00:41   Final   Culture     Final   Value: STAPHYLOCOCCUS AUREUS     Note: SUSCEPTIBILITIES PERFORMED ON PREVIOUS CULTURE WITHIN THE LAST 5 DAYS.     Note: Gram Stain Report Called to,Read Back By and Verified With: [name] 09/18/12 @ 1PM BY [name]   Report Status 09/20/2012 FINAL   Final  CULTURE, BLOOD (ROUTINE X 2)     Status: None   Collection Time    09/17/12  3:10 PM      Result Value Range Status   Specimen Description BLOOD ARM LEFT   Final   Special Requests BOTTLES DRAWN AEROBIC AND ANAEROBIC 10CC   Final   Culture  Setup Time 09/18/2012 00:44   Final   Culture     Final   Value: STAPHYLOCOCCUS AUREUS     Note: RIFAMPIN AND GENTAMICIN SHOULD NOT BE USED AS SINGLE DRUGS FOR TREATMENT OF STAPH INFECTIONS. This organism DOES NOT demonstrate inducible Clindamycin resistance in vitro.     Note: Gram Stain Report Called to,Read Back By and Verified With: [name] 09/18/12 @ 1PM BY [name]   Report Status 09/20/2012 FINAL   Final   Organism ID, Bacteria STAPHYLOCOCCUS AUREUS   Final  URINE CULTURE      Status: None   Collection Time    09/17/12  6:10 PM      Result Value Range Status   Specimen Description URINE, CLEAN CATCH   Final   Special Requests vancomycin and menopenem   Final   Culture  Setup Time 09/17/2012 19:13   Final   Colony Count NO GROWTH   Final   Culture NO GROWTH   Final   Report Status 09/18/2012 FINAL   Final  MRSA PCR SCREENING     Status: None   Collection Time    09/17/12  6:51 PM      Result Value Range Status   MRSA by PCR NEGATIVE  NEGATIVE Final   Comment:            The GeneXpert MRSA Assay (FDA     approved for NASAL specimens     only), is one component of a     comprehensive MRSA colonization     surveillance program. It is not     intended to diagnose MRSA     infection nor to guide or     monitor treatment for     MRSA infections.  CULTURE, BLOOD (ROUTINE X 2)     Status: None   Collection Time    09/19/12  6:30 PM      Result Value Range Status   Specimen Description BLOOD LEFT ARM   Final   Special Requests BOTTLES DRAWN AEROBIC AND ANAEROBIC 10CC EACH   Final   Culture  Setup Time 09/20/2012 01:48   Final   Culture     Final   Value:        BLOOD CULTURE RECEIVED NO GROWTH TO DATE CULTURE WILL BE HELD FOR 5 DAYS BEFORE ISSUING A FINAL NEGATIVE REPORT   Report Status PENDING   Incomplete  CULTURE, BLOOD (ROUTINE X 2)     Status: None   Collection Time    09/19/12  6:40 PM      Result Value Range Status   Specimen Description BLOOD LEFT HAND   Final   Special Requests BOTTLES DRAWN AEROBIC AND ANAEROBIC 10CC EACH   Final   Culture  Setup Time 09/20/2012 01:48   Final   Culture     Final   Value:        BLOOD CULTURE RECEIVED NO GROWTH TO DATE CULTURE WILL BE HELD FOR 5 DAYS BEFORE ISSUING A FINAL NEGATIVE REPORT   Report Status PENDING   Incomplete   Assessment: She is improving on therapy for MSSA bacteremia and possible endocarditis. I will arrange followup in my clinic in 3 weeks. Yesterday, I mistakenly recommended using  vancomycin but she is on cefazolin because it is more active against MSSA and vancomycin.  Plan: 1. Continue cefazolin for 4-6 weeks 2. I will arrange followup in my clinic in 3 weeks 3. Please call if we can be of further assistance while she is here.  Cliffton Asters, MD [facility] for Infectious Disease [facility] [phone] pager   [phone] cell 09/22/2012, 6:18 PM

## 2012-09-23 LAB — GLUCOSE, CAPILLARY
Glucose-Capillary: 120 mg/dL — ABNORMAL HIGH (ref 70–99)
Glucose-Capillary: 158 mg/dL — ABNORMAL HIGH (ref 70–99)

## 2012-09-23 MED ORDER — SODIUM CHLORIDE 0.9 % IJ SOLN: 10.0000 mL | INTRAMUSCULAR | Status: DC | PRN

## 2012-09-23 MED ORDER — HYDROCERIN EX CREA: 1.0000 "application " | TOPICAL_CREAM | Freq: Every day | CUTANEOUS | Status: DC

## 2012-09-23 MED ORDER — CEFAZOLIN SODIUM-DEXTROSE 2-3 GM-% IV SOLR: 2.0000 g | Freq: Three times a day (TID) | INTRAVENOUS | Status: DC

## 2012-09-23 MED ORDER — HEPARIN SOD (PORK) LOCK FLUSH 100 UNIT/ML IV SOLN
250.0000 [IU] | INTRAVENOUS | Status: AC | PRN
  Administered 2012-09-23: 250 [IU]

## 2012-09-23 NOTE — Discharge Summary (Signed)
Physician Discharge Summary  Patient ID: [name] MRN: [id number] DOB/AGE: 66-07-1945 66 y.o.  Admit date: 09/17/2012 Discharge date: 09/23/2012  Primary Care Physician:  [name], MD  Discharge Diagnoses:    . AORTIC STENOSIS . Cellulitis . Morbid obesity . PVD (peripheral vascular disease) . Lactic acidosis . Hypotension . Hyponatremia . Staphylococcus aureus bacteremia . THROMBOCYTOPENIA . VENOUS INSUFFICIENCY  Consults: Infectious disease, Dr. Orvan Falconer Wound care  Recommendations for Outpatient Follow-up:  1. Please follow CBC and BMET at the followup  Allergies:   Allergies  Allergen Reactions  . Latex     REACTION: itch  . Penicillins     REACTION: hives     Discharge Medications:   Medication List         ALPRAZolam 1 MG tablet  Commonly known as:  XANAX  Take 1 mg by mouth 3 (three) times daily as needed for anxiety.     aspirin 325 MG tablet  Take 1 tablet (325 mg total) by mouth daily.     ceFAZolin 2-3 GM-% Solr  Commonly known as:  ANCEF  Inject 50 mLs (2 g total) into the vein every 8 (eight) hours. X 6 weeks     famotidine 20 MG tablet  Commonly known as:  PEPCID  Take 1 tablet (20 mg total) by mouth daily. For indigestion     FLUoxetine 20 MG capsule  Commonly known as:  PROZAC  Take 20 mg by mouth daily.     furosemide 80 MG tablet  Commonly known as:  LASIX  Take 40-80 mg by mouth 2 (two) times daily. 80 mg in the morning and 40 mg in the evening     hydrocerin Crea  Apply 1 application topically daily.     lactulose 10 GM/15ML solution  Commonly known as:  CHRONULAC  Take 15 mLs (10 g total) by mouth 2 (two) times daily as needed (constipation).     levothyroxine 137 MCG tablet  Commonly known as:  SYNTHROID, LEVOTHROID  Take 137 mcg by mouth daily.     metFORMIN 1000 MG tablet  Commonly known as:  GLUCOPHAGE  Take 1,000 mg by mouth 2 (two) times daily with a meal.     metoprolol succinate 25 MG 24 hr  tablet  Commonly known as:  TOPROL-XL  Take 12.5 mg by mouth daily.     morphine 15 MG tablet  Commonly known as:  MSIR  Take 15 mg by mouth every 4 (four) hours as needed. pain     morphine 60 MG 12 hr tablet  Commonly known as:  MS CONTIN  Take 60 mg by mouth every 12 (twelve) hours.     potassium chloride 10 MEQ tablet  Commonly known as:  K-DUR,KLOR-CON  Take 20 mEq by mouth 2 (two) times daily.     senna-docusate 8.6-50 MG per tablet  Commonly known as:  Senokot-S  Take 4 tablets by mouth daily. For constipation.     sodium chloride 0.9 % injection  10-40 mLs by Intracatheter route as needed (flush).     spironolactone 25 MG tablet  Commonly known as:  ALDACTONE  Take one tab twice a day and may increase to two tab bid as needed         Brief H and P: For complete details please refer to admission H and P, but in briefStella L [name] is a 67 y.o. female  With multiple medical problems presented with 2 month hx of increasing right leg  swelling, redness and pain. She has had issues with redness in the past but it went away without abx. This time she also developed a fever. Decreased appetite, decreased energy. Blood sugars have also been increasing. +diarrhea x 1. In the Er, there was concern for cellulitis and sepsis. Lactic acid was elevated and BP on the low side. X rays were done that were suggestive of cellulitis. No osteo. Patient was admitted to step down for further workup.  Hospital Course:  Patient is a 66 year old female with obesity, aortic stenosis, multifactorial CHF, chronic lower extremity venous stasis had recurrent lower extremity cellulitis. Patient was admitted with worsening cellulitis and concern of sepsis, patient was started on empiric vancomycin and meropenem. Both set of admission blood cultures grew staph aureus. Infectious disease consultation was obtained.  Staphylococcus aureus bacteremia  - PICC line placed, on Ancef for 6 weeks. Dr. Benjamine Mola  discussed with cardiology, she is not a candidate for TEE nor valve replacement/repair- was on palliative care at one point. 2-D echo was done which did not show any mobile vegetations.   Cellulitis with significant edema  - Improving, wound care was consulted, continue Ancef for 6 weeks. Patient was followed by ID, Dr. Orvan Falconer. Initially due to the sepsis, patient had hypotension hence Lasix was decreased and spironolactone was held. The patient's home Lasix dose was resumed on 09/22/12. I advised her to take 80 mg of Lasix twice a day for 3 days and then continue her home dose.    Sepsis with lactic acidosis: Improved   Thrombocytopenia: Likely secondary to severe sepsis, Now improving. Please obtain CBC at the time of hospital follow up in 2 weeks.   PVD (peripheral vascular disease) continue aspirin  Morbid obesity :  patient was counseled for diet and weight control  Hypotension- stable  Hyponatremia-resolved     Day of Discharge BP 105/64  Pulse 78  Temp(Src) 98.2 F (36.8 C) (Oral)  Resp 22  Ht 5' (1.524 m)  Wt 95.528 kg (210 lb 9.6 oz)  BMI 41.13 kg/m2  SpO2 91%  Physical Exam:  General: Alert and awake, oriented x3, not in any acute distress.  HEENT: anicteric sclera, PERLA, EOMI  CVS: S1-S2 clear, no murmur rubs or gallops  Chest: clear to auscultation bilaterally, no wheezing, rales or rhonchi  Abdomen:Morbidly obese, soft nontender, nondistended, normal bowel sounds  Extremities: no cyanosis, clubbing, 2-3+ edema noted bilaterally   The results of significant diagnostics from this hospitalization (including imaging, microbiology, ancillary and laboratory) are listed below for reference.    LAB RESULTS: Basic Metabolic Panel:  Recent Labs Lab 09/19/12 1017 09/22/12 0500  NA 130* 137  K 4.1 4.3  CL 98 101  CO2 27 29  GLUCOSE 176* 210*  BUN 16 15  CREATININE 0.63 0.69  CALCIUM 8.5 8.3*   Liver Function Tests: No results found for this basename: AST,  ALT, ALKPHOS, BILITOT, PROT, ALBUMIN,  in the last 168 hours No results found for this basename: LIPASE, AMYLASE,  in the last 168 hours No results found for this basename: AMMONIA,  in the last 168 hours CBC:  Recent Labs Lab 09/17/12 1458  09/19/12 1017 09/22/12 0500  WBC 9.4  < > 5.1 15.1*  NEUTROABS 8.8*  --   --   --   HGB 13.0  < > 11.3* 13.0  HCT 39.6  < > 34.7* 39.5  MCV 88.0  < > 88.3 89.8  PLT 68*  < > 51* 99*  < > =  values in this interval not displayed. Cardiac Enzymes: No results found for this basename: CKTOTAL, CKMB, CKMBINDEX, TROPONINI,  in the last 168 hours BNP: No components found with this basename: POCBNP,  CBG:  Recent Labs Lab 09/22/12 2026 09/23/12 0738  GLUCAP 161* 120*    Significant Diagnostic Studies:  Dg Tibia/fibula Right  09-21-12   *RADIOLOGY REPORT*  Clinical Data: Lower leg pain, swelling and erythema extending into the foot for 3 weeks.  RIGHT TIBIA AND FIBULA - 2 VIEW  Comparison: Right knee MRI 10/31/2007.  Findings: The mineralization and alignment are normal.  There is no evidence of acute fracture, dislocation or bone destruction.  There is mildly progressive medial joint space loss at the knee.  The soft tissues of the lower leg are diffusely prominent with suspected generalized subcutaneous edema.  There is no evidence of foreign body or soft tissue emphysema.  IMPRESSION: Suspected diffuse lower leg soft tissue edema suggestive of cellulitis in this clinical context.  No soft tissue emphysema, foreign body or osseous abnormality identified.   Original Report Authenticated By: Carey Bullocks, M.D.   Dg Chest Port 1 View  September 21, 2012   *RADIOLOGY REPORT*  Clinical Data: Cough and congestion.  PORTABLE CHEST - 1 VIEW  Comparison: 12/12/2011  Findings: Semi upright view of the chest demonstrates low lung volumes. There are prominent interstitial lung markings.  Heart size is upper limits of normal.  Trachea is midline.  Previous right  shoulder surgery.  IMPRESSION: Low lung volumes.  Low lung volumes may be accentuating the size of the heart and interstitial lung markings.  Difficult to exclude mild edema.   Original Report Authenticated By: Richarda Overlie, M.D.   Dg Foot Complete Right  September 21, 2012   *RADIOLOGY REPORT*  Clinical Data: Lower leg pain, swelling and erythema extending into the foot for 3 weeks.  RIGHT FOOT COMPLETE - 3+ VIEW  Comparison: None.  Findings: Within the foot, the bones appear mildly demineralized. There is no evidence of acute fracture, dislocation or bone destruction.  The soft tissues appear diffusely prominent, especially in the dorsum of the forefoot.  There is no evidence of foreign body or soft tissue emphysema.  IMPRESSION: Diffuse soft tissue prominence suspicious for cellulitis in this clinical context.  No evidence of foreign body or osteomyelitis.   Original Report Authenticated By: Carey Bullocks, M.D.    2D ECHO: Study Conclusions  - Left ventricle: The cavity size was normal. There was moderate concentric hypertrophy. Systolic function was hyperdynamic. The estimated ejection fraction was in the range of 70% to 75%. Wall motion was normal; there were no regional wall motion abnormalities. - Aortic valve: Mildly to moderately calcified annulus. Moderately thickened, moderately calcified leaflets. Cusp separation was moderately reduced. There was moderate stenosis. Trivial regurgitation. Valve area: 1.39cm^2(VTI). Valve area: 1.1cm^2 (Vmax). - Mitral valve: Calcified annulus. Mildly thickened leaflets . Nodular thickening of the submitral apparatus, possiblyrepresenting calcification. Mild regurgitation. Valve area by continuity equation (using LVOT flow): 2.2cm^2. - Left atrium: The atrium was moderately dilated. - Atrial septum: No defect or patent foramen ovale was identified.    Disposition and Follow-up: Discharge Orders   Future Appointments Provider Department Dept Phone    10/11/2012 3:15 PM Cliffton Asters, MD [facility] for Infectious Disease [phone]   Future Orders Complete By Expires     Diet - low sodium heart healthy  As directed     Discharge instructions  As directed     Comments:  1) Please take 80mg  Lasix twice a day for 3 days, then continue your home dose of 80mg  AM and 40mg  PM    Increase activity slowly  As directed         DISPOSITION: Home  DIET: Carb modified diet, low-sodium    TESTS THAT NEED FOLLOW-UP CBC, BMET at the time of followup   DISCHARGE FOLLOW-UP Follow-up Information   Follow up with [name], MD. Schedule an appointment as soon as possible for a visit in 2 weeks. (for hospital follow-up)    Contact information:   [address] [place] [address] [phone]       Follow up with Cliffton Asters, MD On 10/11/2012. (at 3:15 PM)    Contact information:   [address] [place] [address] [phone]       Time spent on Discharge: 45 minutes   Signed:   Collie Kittel M.D. [facility] 09/23/2012, 11:06 AM Pager: [phone]

## 2012-09-23 NOTE — Progress Notes (Signed)
[facility]  Patient Status: New pt for [facility] this hospital admission  [facility] is providing the following services: HH RN, Home Infusion Pharmacy for Home IV antibiotics.  [facility] Infusion Coordinator has performed in hospital teaching visit for IV ABX administration to support independence at home.  Second teaching visit scheduled this a.m., 09/23/12.  [facility] will follow and support DC to home.  If patient discharges after hours, please call [phone].   Sedalia Muta 09/23/2012, 9:05 AM

## 2012-09-26 LAB — CULTURE, BLOOD (ROUTINE X 2): Culture: NO GROWTH

## 2012-09-28 ENCOUNTER — Telehealth: Payer: Self-pay | Admitting: Infectious Disease

## 2012-09-28 NOTE — Telephone Encounter (Signed)
Patient's daughter called stating that her Mother had irritation and blood around the PICC Line site and was having subjective fevers.  She is being rx for MSSA bacteremia/possible endocarditis with ancef 2g iv q 8 hours  I have encouraged pt to go to ED tonight otherwise I have urged that she MUST be seen tomorrow at the latest.   It sounds as if she may have PICC line infection vs DVT

## 2012-10-06 ENCOUNTER — Telehealth: Payer: Self-pay | Admitting: *Deleted

## 2012-10-06 NOTE — Telephone Encounter (Signed)
Physical Therapy has been d/ced, pt has met goals/plateaued, still c/o pain in right lower extremity.  [facility] PT reporting left lower extremity erythema, knee to ankle, no pain, no fever.  Has [facility] appt Monday, Aug. 18 w/ Dr. Orvan Falconer.

## 2012-10-06 NOTE — Telephone Encounter (Signed)
I will evaluate this at her appointment with me on August 18.

## 2012-10-11 ENCOUNTER — Encounter: Payer: Self-pay | Admitting: Internal Medicine

## 2012-10-11 ENCOUNTER — Encounter: Payer: Self-pay | Admitting: *Deleted

## 2012-10-11 ENCOUNTER — Ambulatory Visit (INDEPENDENT_AMBULATORY_CARE_PROVIDER_SITE_OTHER): Payer: Medicare Other | Admitting: Internal Medicine

## 2012-10-11 VITALS — BP 126/62 | HR 90 | Temp 98.4°F

## 2012-10-11 DIAGNOSIS — A4901 Methicillin susceptible Staphylococcus aureus infection, unspecified site: Secondary | ICD-10-CM

## 2012-10-11 DIAGNOSIS — L0291 Cutaneous abscess, unspecified: Secondary | ICD-10-CM

## 2012-10-11 DIAGNOSIS — L0390 Cellulitis, unspecified: Secondary | ICD-10-CM

## 2012-10-11 DIAGNOSIS — R7881 Bacteremia: Secondary | ICD-10-CM

## 2012-10-11 NOTE — Progress Notes (Signed)
Patient ID: [name], female   DOB: 1945/08/11, 67 y.o.   MRN: [id number]         [facility] Infectious Disease  Patient Active Problem List   Diagnosis Date Noted  . Staphylococcus aureus bacteremia 09/19/2012  . Lactic acidosis 09/17/2012  . Hypotension 09/17/2012  . Hyponatremia 09/17/2012  . Encephalopathy acute 12/11/2011  . Dehydration 12/11/2011  . UTI (lower urinary tract infection) 12/11/2011  . Hypokalemia 12/11/2011  . Respiratory failure, chronic 12/11/2011  . Acute on chronic diastolic CHF (congestive heart failure), NYHA class 1 11/04/2011  . Chronic diastolic CHF (congestive heart failure) 10/27/2011  . Morbid obesity 10/27/2011  . Lower extremity weakness 10/26/2011  . Spinal stenosis of lumbar region 10/25/2011  . Cellulitis 07/19/2010  . PVD (peripheral vascular disease) 07/19/2010  . TIA 12/25/2009  . CONGESTIVE HEART FAILURE UNSPECIFIED 06/28/2009  . HYPOTHYROIDISM 02/06/2009  . HYPOKALEMIA 02/06/2009  . THROMBOCYTOPENIA 02/06/2009  . DEPRESSION 02/06/2009  . AORTIC STENOSIS 02/06/2009  . VENOUS INSUFFICIENCY 02/06/2009  . COPD 02/06/2009  . ARTHRITIS 02/06/2009  . Edema 02/06/2009  . CAROTID BRUIT 02/06/2009    Patient's Medications  New Prescriptions   No medications on file  Previous Medications   ALPRAZOLAM (XANAX) 1 MG TABLET    Take 1 mg by mouth 3 (three) times daily as needed for anxiety.    ASPIRIN 325 MG TABLET    Take 1 tablet (325 mg total) by mouth daily.   CEFAZOLIN (ANCEF) 2-3 GM-% SOLR    Inject 50 mLs (2 g total) into the vein every 8 (eight) hours. X 6 weeks   FAMOTIDINE (PEPCID) 20 MG TABLET    Take 1 tablet (20 mg total) by mouth daily. For indigestion   FLUOXETINE (PROZAC) 20 MG CAPSULE    Take 20 mg by mouth daily.     FUROSEMIDE (LASIX) 80 MG TABLET    Take 40-80 mg by mouth 2 (two) times daily. 80 mg in the morning and 40 mg in the evening   HYDROCERIN (EUCERIN) CREA    Apply 1 application topically daily.   LACTULOSE (CHRONULAC) 10 GM/15ML SOLUTION    Take 15 mLs (10 g total) by mouth 2 (two) times daily as needed (constipation).   LEVOTHYROXINE (SYNTHROID, LEVOTHROID) 137 MCG TABLET    Take 137 mcg by mouth daily.   METFORMIN (GLUCOPHAGE) 1000 MG TABLET    Take 1,000 mg by mouth 2 (two) times daily with a meal.   METOPROLOL SUCCINATE (TOPROL-XL) 25 MG 24 HR TABLET    Take 12.5 mg by mouth daily.    MORPHINE (MS CONTIN) 60 MG 12 HR TABLET    Take 60 mg by mouth every 12 (twelve) hours.    MORPHINE (MSIR) 15 MG TABLET    Take 15 mg by mouth every 4 (four) hours as needed. pain   POTASSIUM CHLORIDE (K-DUR,KLOR-CON) 10 MEQ TABLET    Take 20 mEq by mouth 2 (two) times daily.    SENNA-DOCUSATE (SENOKOT-S) 8.6-50 MG PER TABLET    Take 4 tablets by mouth daily. For constipation.   SODIUM CHLORIDE 0.9 % INJECTION    10-40 mLs by Intracatheter route as needed (flush).   SPIRONOLACTONE (ALDACTONE) 25 MG TABLET    Take one tab twice a day and may increase to two tab bid as needed  Modified Medications   No medications on file  Discontinued Medications   No medications on file    Subjective: Ms. Cena is in for her hospital  followup visit. She was hospitalized last month with right leg cellulitis and methicillin sensitive staph aureus bacteremia. She has aortic stenosis, chronic congestive heart failure and bilateral venous stasis of her lower extremities. A transthoracic echocardiogram did not show any evidence of endocarditis. A transesophageal echocardiogram was not done because of the potential risks associated with the procedure and the cause was unlikely to alter her management. She is not a candidate for aortic valve replacement. She is now completed 29 days of antibiotic therapy. She has not had any problems tolerating her PICC or cefazolin. She still bothered by chronic swelling of her lower extremities associated with bilateral erythema but the erythema extending up into her right thigh and associated  pain of the cellulitis has resolved. She has not had any fever, chills or sweats. She has not seen any of her other physicians since discharge. She has been bothered by yeast infection across her lower abdomen. Review of Systems: Pertinent items are noted in HPI.  Past Medical History  Diagnosis Date  . Carotid bruit     0-39% bilateral ICA by dopplers 03/2010  . Aortic stenosis     a. Moderate echo 01/2009, then severe 03/2010 --> last echo 09/2011:  EF 60-65%, grade 2 diastolic dysfunction, severe aortic stenosis, mean gradient 50, mild MR    b. Not an operative candidate due to comorbidities, not a candidate for TAVR given PVD. c. low risk myovue 11/2007 brest attenuation EF  63%  . Venous insufficiency   . Arthritis   . Depression   . COPD (chronic obstructive pulmonary disease)   . Hypothyroidism   . Thrombocytopenia   . Hypokalemia   . Edema   . Respiratory failure     Ventilator-dependent respiratory failure secondary to hypercarbia, cardiac arrest, presumed penumonia 12/2008  . Cardiac arrest     12/2010 in setting of respiratory failure with less than 15 minutes of CPR; initial rhythm was asystole  . PVD (peripheral vascular disease)     Eval by Dr. Excell Seltzer 2012: noninvasive immaging suggested significant aortoiliac disease, ABIs in mild range  - med rx given significant comorbidities  . H/O endoscopy     For ?ampullary mass - had EGD 07/2010 with no evidence of ampullary mass, some CBD dilitation   . Chronic pain   . Lumbar stenosis     Admitted 09/2011 - not felt to be a good candidate for surgical intervention.  . CHF (congestive heart failure)   . Morbid obesity 10/27/2011  . Chronic diastolic CHF (congestive heart failure) 10/27/2011  . Lower extremity weakness 10/26/2011  . Spinal stenosis of lumbar region 10/25/2011  . ARTHRITIS 02/06/2009    Qualifier: Diagnosis of  By: Kem Parkinson    . COPD 02/06/2009    Qualifier: Diagnosis of  By: Kem Parkinson    . VENOUS  INSUFFICIENCY 02/06/2009    Qualifier: Diagnosis of  By: Kem Parkinson    . Diabetes mellitus without complication   . Hypertension     History  Substance Use Topics  . Smoking status: Current Every Day Smoker -- 2.00 packs/day for 45 years    Types: Cigarettes  . Smokeless tobacco: Current User     Comment: PT USES VAPOR CIGARET[employer]  . Alcohol Use: No    Family History  Problem Relation Age of Onset  . Emphysema Father   . Allergies Sister   . Asthma Sister   . Heart disease Father   . Cancer Mother     Allergies  Allergen Reactions  . Latex     REACTION: itch  . Penicillins     REACTION: hives    Objective: Temp: 98.4 F (36.9 C) (08/18 1530) Temp src: Oral (08/18 1530) BP: 126/62 mmHg (08/18 1530) Pulse Rate: 90 (08/18 1530)  General: She is alert and in no distress. She is wearing supplemental oxygen Skin: She has chronic venous stasis changes in both lower legs. Her right arm PICC site appears normal with just a little bit of erythema at the insertion site. Lungs: Clear bilaterally Cor: Regular S1 and S2 with a harsh 2/6 early systolic murmur Abdomen: Obese, soft and nontender she has weeping erythematous rash diffusely across her lower abdomen in her skin folds extending down into her groin   Assessment: Her right leg cellulitis and bacteremia are responding well to antibiotic therapy. I will extend her antibiotic treatment 13 more days to give her 6 full weeks of treatment to cover for the possibility of aortic valve endocarditis.  Plan: 1. Continue cefazolin through August 31 and then stop and have the PICC removed 2. Followup here in 4 weeks   Cliffton Asters, MD [facility] [phone] pager   [phone] cell 10/11/2012, 3:55 PM

## 2012-10-12 ENCOUNTER — Other Ambulatory Visit: Payer: Self-pay | Admitting: Licensed Clinical Social Worker

## 2012-10-12 ENCOUNTER — Telehealth: Payer: Self-pay | Admitting: Cardiovascular Disease

## 2012-10-12 DIAGNOSIS — B379 Candidiasis, unspecified: Secondary | ICD-10-CM

## 2012-10-12 MED ORDER — FLUCONAZOLE 100 MG PO TABS: 100.0000 mg | ORAL_TABLET | Freq: Every day | ORAL | Status: DC

## 2012-10-12 NOTE — Telephone Encounter (Signed)
Spoke with pt's visiting nurse. Pt continues to be SOB LE edematous, even after loosing weigh with diuretics. Pt's 02  demands have increased. Pt can ambulate about 8 to 10 feet before sitting down due to be out of breath. According to nurse pt lungs are clear. Pt appointment has re-scheduled for Friday August 22 nd at 3:00 PM. Pt's nurse aware.

## 2012-10-12 NOTE — Telephone Encounter (Signed)
New Problem   Pt has had alot of wt gain// oxygen has been increased// shortnes of breath// pt wants to know if she can get a soon appt.( Made appt for 11/02/2012.Marland Kitchen Sent this message in case pt needs an earlier appt.

## 2012-10-13 ENCOUNTER — Emergency Department (HOSPITAL_COMMUNITY): Payer: Medicare Other

## 2012-10-13 ENCOUNTER — Telehealth: Payer: Self-pay | Admitting: Cardiovascular Disease

## 2012-10-13 ENCOUNTER — Inpatient Hospital Stay (HOSPITAL_COMMUNITY)
Admission: EM | Admit: 2012-10-13 | Discharge: 2012-10-28 | DRG: 291 | Disposition: A | Discharge status: Hospice | Payer: Medicare Other | Attending: Internal Medicine | Admitting: Internal Medicine

## 2012-10-13 ENCOUNTER — Observation Stay (HOSPITAL_COMMUNITY): Payer: Medicare Other

## 2012-10-13 ENCOUNTER — Encounter (HOSPITAL_COMMUNITY): Payer: Self-pay | Admitting: Emergency Medicine

## 2012-10-13 ENCOUNTER — Encounter: Payer: Self-pay | Admitting: Cardiovascular Disease

## 2012-10-13 DIAGNOSIS — M129 Arthropathy, unspecified: Secondary | ICD-10-CM | POA: Diagnosis present

## 2012-10-13 DIAGNOSIS — I5032 Chronic diastolic (congestive) heart failure: Secondary | ICD-10-CM

## 2012-10-13 DIAGNOSIS — I10 Essential (primary) hypertension: Secondary | ICD-10-CM | POA: Diagnosis present

## 2012-10-13 DIAGNOSIS — Z9981 Dependence on supplemental oxygen: Secondary | ICD-10-CM

## 2012-10-13 DIAGNOSIS — E876 Hypokalemia: Secondary | ICD-10-CM | POA: Diagnosis present

## 2012-10-13 DIAGNOSIS — A4901 Methicillin susceptible Staphylococcus aureus infection, unspecified site: Secondary | ICD-10-CM | POA: Diagnosis present

## 2012-10-13 DIAGNOSIS — N390 Urinary tract infection, site not specified: Secondary | ICD-10-CM | POA: Diagnosis not present

## 2012-10-13 DIAGNOSIS — I798 Other disorders of arteries, arterioles and capillaries in diseases classified elsewhere: Secondary | ICD-10-CM | POA: Diagnosis present

## 2012-10-13 DIAGNOSIS — L0390 Cellulitis, unspecified: Secondary | ICD-10-CM

## 2012-10-13 DIAGNOSIS — R0902 Hypoxemia: Secondary | ICD-10-CM

## 2012-10-13 DIAGNOSIS — E873 Alkalosis: Secondary | ICD-10-CM

## 2012-10-13 DIAGNOSIS — R5381 Other malaise: Secondary | ICD-10-CM | POA: Diagnosis present

## 2012-10-13 DIAGNOSIS — Z6841 Body Mass Index (BMI) 40.0 and over, adult: Secondary | ICD-10-CM

## 2012-10-13 DIAGNOSIS — I359 Nonrheumatic aortic valve disorder, unspecified: Secondary | ICD-10-CM | POA: Diagnosis present

## 2012-10-13 DIAGNOSIS — N179 Acute kidney failure, unspecified: Secondary | ICD-10-CM | POA: Diagnosis not present

## 2012-10-13 DIAGNOSIS — G894 Chronic pain syndrome: Secondary | ICD-10-CM | POA: Diagnosis present

## 2012-10-13 DIAGNOSIS — R7881 Bacteremia: Secondary | ICD-10-CM

## 2012-10-13 DIAGNOSIS — R339 Retention of urine, unspecified: Secondary | ICD-10-CM | POA: Diagnosis not present

## 2012-10-13 DIAGNOSIS — R739 Hyperglycemia, unspecified: Secondary | ICD-10-CM

## 2012-10-13 DIAGNOSIS — D649 Anemia, unspecified: Secondary | ICD-10-CM | POA: Diagnosis present

## 2012-10-13 DIAGNOSIS — I2789 Other specified pulmonary heart diseases: Secondary | ICD-10-CM | POA: Diagnosis present

## 2012-10-13 DIAGNOSIS — J9620 Acute and chronic respiratory failure, unspecified whether with hypoxia or hypercapnia: Secondary | ICD-10-CM | POA: Diagnosis present

## 2012-10-13 DIAGNOSIS — G9340 Encephalopathy, unspecified: Secondary | ICD-10-CM

## 2012-10-13 DIAGNOSIS — K5909 Other constipation: Secondary | ICD-10-CM | POA: Diagnosis present

## 2012-10-13 DIAGNOSIS — E860 Dehydration: Secondary | ICD-10-CM

## 2012-10-13 DIAGNOSIS — E861 Hypovolemia: Secondary | ICD-10-CM | POA: Diagnosis not present

## 2012-10-13 DIAGNOSIS — E1051 Type 1 diabetes mellitus with diabetic peripheral angiopathy without gangrene: Secondary | ICD-10-CM | POA: Diagnosis present

## 2012-10-13 DIAGNOSIS — F3289 Other specified depressive episodes: Secondary | ICD-10-CM | POA: Diagnosis present

## 2012-10-13 DIAGNOSIS — E039 Hypothyroidism, unspecified: Secondary | ICD-10-CM | POA: Diagnosis present

## 2012-10-13 DIAGNOSIS — I951 Orthostatic hypotension: Secondary | ICD-10-CM

## 2012-10-13 DIAGNOSIS — K7460 Unspecified cirrhosis of liver: Secondary | ICD-10-CM | POA: Diagnosis present

## 2012-10-13 DIAGNOSIS — I5033 Acute on chronic diastolic (congestive) heart failure: Principal | ICD-10-CM | POA: Diagnosis present

## 2012-10-13 DIAGNOSIS — E872 Acidosis: Secondary | ICD-10-CM

## 2012-10-13 DIAGNOSIS — Z79899 Other long term (current) drug therapy: Secondary | ICD-10-CM

## 2012-10-13 DIAGNOSIS — N898 Other specified noninflammatory disorders of vagina: Secondary | ICD-10-CM | POA: Diagnosis not present

## 2012-10-13 DIAGNOSIS — E1059 Type 1 diabetes mellitus with other circulatory complications: Secondary | ICD-10-CM | POA: Diagnosis present

## 2012-10-13 DIAGNOSIS — E871 Hypo-osmolality and hyponatremia: Secondary | ICD-10-CM | POA: Diagnosis not present

## 2012-10-13 DIAGNOSIS — M48061 Spinal stenosis, lumbar region without neurogenic claudication: Secondary | ICD-10-CM | POA: Diagnosis present

## 2012-10-13 DIAGNOSIS — K7689 Other specified diseases of liver: Secondary | ICD-10-CM | POA: Diagnosis present

## 2012-10-13 DIAGNOSIS — I872 Venous insufficiency (chronic) (peripheral): Secondary | ICD-10-CM | POA: Diagnosis present

## 2012-10-13 DIAGNOSIS — I739 Peripheral vascular disease, unspecified: Secondary | ICD-10-CM

## 2012-10-13 DIAGNOSIS — Z8674 Personal history of sudden cardiac arrest: Secondary | ICD-10-CM

## 2012-10-13 DIAGNOSIS — R609 Edema, unspecified: Secondary | ICD-10-CM

## 2012-10-13 DIAGNOSIS — J9610 Chronic respiratory failure, unspecified whether with hypoxia or hypercapnia: Secondary | ICD-10-CM

## 2012-10-13 DIAGNOSIS — G9341 Metabolic encephalopathy: Secondary | ICD-10-CM | POA: Diagnosis not present

## 2012-10-13 DIAGNOSIS — I509 Heart failure, unspecified: Secondary | ICD-10-CM | POA: Diagnosis present

## 2012-10-13 DIAGNOSIS — J449 Chronic obstructive pulmonary disease, unspecified: Secondary | ICD-10-CM | POA: Diagnosis present

## 2012-10-13 DIAGNOSIS — Z794 Long term (current) use of insulin: Secondary | ICD-10-CM

## 2012-10-13 DIAGNOSIS — Z66 Do not resuscitate: Secondary | ICD-10-CM | POA: Diagnosis not present

## 2012-10-13 DIAGNOSIS — F17200 Nicotine dependence, unspecified, uncomplicated: Secondary | ICD-10-CM | POA: Diagnosis present

## 2012-10-13 DIAGNOSIS — D696 Thrombocytopenia, unspecified: Secondary | ICD-10-CM | POA: Diagnosis present

## 2012-10-13 DIAGNOSIS — F329 Major depressive disorder, single episode, unspecified: Secondary | ICD-10-CM | POA: Diagnosis present

## 2012-10-13 DIAGNOSIS — L02419 Cutaneous abscess of limb, unspecified: Secondary | ICD-10-CM | POA: Diagnosis present

## 2012-10-13 DIAGNOSIS — Z515 Encounter for palliative care: Secondary | ICD-10-CM

## 2012-10-13 DIAGNOSIS — J4489 Other specified chronic obstructive pulmonary disease: Secondary | ICD-10-CM | POA: Diagnosis present

## 2012-10-13 HISTORY — DX: Type 2 diabetes mellitus without complications: E11.9

## 2012-10-13 HISTORY — DX: Candidiasis, unspecified: B37.9

## 2012-10-13 HISTORY — DX: Unspecified cirrhosis of liver: K74.60

## 2012-10-13 HISTORY — DX: Bacteremia: R78.81

## 2012-10-13 HISTORY — DX: Hepatomegaly with splenomegaly, not elsewhere classified: R16.2

## 2012-10-13 LAB — COMPREHENSIVE METABOLIC PANEL
ALT: <5 U/L (ref 0–35)
Calcium: 8.5 mg/dL (ref 8.4–10.5)
GFR calc Af Amer: >90 mL/min (ref >90)
Glucose, Bld: 104 mg/dL — ABNORMAL HIGH (ref 70–99)
Sodium: 139 mEq/L (ref 135–145)
Total Protein: 7 g/dL (ref 6.0–8.3)

## 2012-10-13 LAB — D-DIMER, QUANTITATIVE: D-Dimer, Quant: 2 ug/mL-FEU — ABNORMAL HIGH (ref 0.00–0.48)

## 2012-10-13 LAB — URINALYSIS, ROUTINE W REFLEX MICROSCOPIC
Hgb urine dipstick: NEGATIVE
Nitrite: NEGATIVE
Protein, ur: NEGATIVE mg/dL
Urobilinogen, UA: 0.2 mg/dL (ref 0.0–1.0)

## 2012-10-13 LAB — CBC
HCT: 34.8 % — ABNORMAL LOW (ref 36.0–46.0)
MCH: 29.1 pg (ref 26.0–34.0)
MCV: 91.1 fL (ref 78.0–100.0)
RBC: 3.82 MIL/uL — ABNORMAL LOW (ref 3.87–5.11)
RDW: 16.2 % — ABNORMAL HIGH (ref 11.5–15.5)
WBC: 6.2 10*3/uL (ref 4.0–10.5)

## 2012-10-13 LAB — CBC WITH DIFFERENTIAL/PLATELET
Basophils Absolute: 0 10*3/uL (ref 0.0–0.1)
Basophils Relative: 0 % (ref 0–1)
Eosinophils Absolute: 0.2 10*3/uL (ref 0.0–0.7)
MCH: 28.8 pg (ref 26.0–34.0)
MCHC: 31.5 g/dL (ref 30.0–36.0)
Neutrophils Relative %: 64 % (ref 43–77)
Platelets: 138 10*3/uL — ABNORMAL LOW (ref 150–400)
RDW: 16.1 % — ABNORMAL HIGH (ref 11.5–15.5)

## 2012-10-13 LAB — CREATININE, SERUM: GFR calc Af Amer: >90 mL/min (ref >90)

## 2012-10-13 LAB — GLUCOSE, CAPILLARY: Glucose-Capillary: 109 mg/dL — ABNORMAL HIGH (ref 70–99)

## 2012-10-13 MED ORDER — MORPHINE SULFATE ER 15 MG PO TBCR
60.0000 mg | EXTENDED_RELEASE_TABLET | Freq: Two times a day (BID) | ORAL | Status: DC
  Administered 2012-10-13 – 2012-10-18 (×11): 60 mg via ORAL
  Filled 2012-10-13 (×8): qty 4
  Filled 2012-10-13: qty 2
  Filled 2012-10-13 (×3): qty 4

## 2012-10-13 MED ORDER — LACTULOSE 10 GM/15ML PO SOLN
10.0000 g | Freq: Two times a day (BID) | ORAL | Status: DC | PRN
  Administered 2012-10-15: 10 g via ORAL
  Filled 2012-10-13: qty 15

## 2012-10-13 MED ORDER — LEVOTHYROXINE SODIUM 137 MCG PO TABS
137.0000 ug | ORAL_TABLET | Freq: Every day | ORAL | Status: DC
  Administered 2012-10-14 – 2012-10-28 (×13): 137 ug via ORAL
  Filled 2012-10-13 (×17): qty 1

## 2012-10-13 MED ORDER — SODIUM CHLORIDE 0.9 % IJ SOLN: 3.0000 mL | INTRAMUSCULAR | Status: DC | PRN

## 2012-10-13 MED ORDER — FUROSEMIDE 10 MG/ML IJ SOLN
80.0000 mg | Freq: Two times a day (BID) | INTRAMUSCULAR | Status: DC
  Administered 2012-10-14: 80 mg via INTRAVENOUS
  Filled 2012-10-13 (×2): qty 8

## 2012-10-13 MED ORDER — POTASSIUM CHLORIDE CRYS ER 20 MEQ PO TBCR
40.0000 meq | EXTENDED_RELEASE_TABLET | Freq: Once | ORAL | Status: AC
  Administered 2012-10-13: 40 meq via ORAL
  Filled 2012-10-13 (×2): qty 2

## 2012-10-13 MED ORDER — ACETAMINOPHEN 325 MG PO TABS: 650.0000 mg | ORAL_TABLET | ORAL | Status: DC | PRN

## 2012-10-13 MED ORDER — SPIRONOLACTONE 25 MG PO TABS
25.0000 mg | ORAL_TABLET | Freq: Two times a day (BID) | ORAL | Status: DC
  Administered 2012-10-14 – 2012-10-18 (×11): 25 mg via ORAL
  Filled 2012-10-13 (×15): qty 1

## 2012-10-13 MED ORDER — SODIUM CHLORIDE 0.9 % IV SOLN
250.0000 mL | INTRAVENOUS | Status: DC | PRN
  Administered 2012-10-14: 250 mL via INTRAVENOUS

## 2012-10-13 MED ORDER — METOPROLOL SUCCINATE 12.5 MG HALF TABLET
12.5000 mg | ORAL_TABLET | Freq: Every day | ORAL | Status: DC
  Administered 2012-10-14 – 2012-10-16 (×3): 12.5 mg via ORAL
  Filled 2012-10-13 (×4): qty 1

## 2012-10-13 MED ORDER — SPIRONOLACTONE 25 MG PO TABS: 25.0000 mg | ORAL_TABLET | Freq: Two times a day (BID) | ORAL | Status: DC | PRN

## 2012-10-13 MED ORDER — IOHEXOL 350 MG/ML SOLN
100.0000 mL | Freq: Once | INTRAVENOUS | Status: AC | PRN
  Administered 2012-10-13: 100 mL via INTRAVENOUS

## 2012-10-13 MED ORDER — SODIUM CHLORIDE 0.9 % IJ SOLN
3.0000 mL | Freq: Two times a day (BID) | INTRAMUSCULAR | Status: DC
  Administered 2012-10-14 – 2012-10-18 (×4): 3 mL via INTRAVENOUS

## 2012-10-13 MED ORDER — NITROGLYCERIN 2 % TD OINT
1.0000 [in_us] | TOPICAL_OINTMENT | Freq: Four times a day (QID) | TRANSDERMAL | Status: DC
Start: 2012-10-13 — End: 2012-10-13
  Filled 2012-10-13: qty 1

## 2012-10-13 MED ORDER — HYDROCERIN EX CREA
1.0000 "application " | TOPICAL_CREAM | Freq: Every day | CUTANEOUS | Status: DC
  Administered 2012-10-14 – 2012-10-28 (×13): 1 via TOPICAL
  Filled 2012-10-13: qty 113

## 2012-10-13 MED ORDER — HEPARIN SODIUM (PORCINE) 5000 UNIT/ML IJ SOLN
5000.0000 [IU] | Freq: Three times a day (TID) | INTRAMUSCULAR | Status: DC
  Administered 2012-10-13 – 2012-10-23 (×30): 5000 [IU] via SUBCUTANEOUS
  Filled 2012-10-13 (×32): qty 1

## 2012-10-13 MED ORDER — CEFAZOLIN SODIUM-DEXTROSE 2-3 GM-% IV SOLR
2.0000 g | Freq: Three times a day (TID) | INTRAVENOUS | Status: DC
  Administered 2012-10-14 (×2): 2 g via INTRAVENOUS
  Filled 2012-10-13 (×4): qty 50

## 2012-10-13 MED ORDER — FLUOXETINE HCL 20 MG PO CAPS
20.0000 mg | ORAL_CAPSULE | Freq: Every day | ORAL | Status: DC
  Administered 2012-10-14 – 2012-10-23 (×8): 20 mg via ORAL
  Filled 2012-10-13 (×10): qty 1

## 2012-10-13 MED ORDER — FLUCONAZOLE 100 MG PO TABS
100.0000 mg | ORAL_TABLET | Freq: Every day | ORAL | Status: DC
  Administered 2012-10-14 – 2012-10-18 (×5): 100 mg via ORAL
  Filled 2012-10-13 (×5): qty 1

## 2012-10-13 MED ORDER — ONDANSETRON HCL 4 MG/2ML IJ SOLN
4.0000 mg | Freq: Four times a day (QID) | INTRAMUSCULAR | Status: DC | PRN
  Administered 2012-10-15 – 2012-10-26 (×8): 4 mg via INTRAVENOUS
  Filled 2012-10-13 (×8): qty 2

## 2012-10-13 MED ORDER — FUROSEMIDE 10 MG/ML IJ SOLN
80.0000 mg | Freq: Once | INTRAMUSCULAR | Status: AC
  Administered 2012-10-13: 80 mg via INTRAVENOUS
  Filled 2012-10-13: qty 8

## 2012-10-13 NOTE — Telephone Encounter (Signed)
Spoke with patient's husband who states patient is more SOB and more oxygen-dependent.  He states he has turned patient's oxygen up to 4 L/min where as she is usually at 2 L/min; c/o increased abdominal distention.  Patient's husband is anxious, states he fears that this is stemming from "her bad heart valve" and that he thinks patient needs to go to the hospital.  I advised him to call 911 and that I will let Dr. Eden Emms and Wynona Canes, Dr. Fabio Bering nurse know that patient is going to the hospital - uncertain as to which hospital they will go to.  Husband states patient has an appointment on 8/22 @ 3:00 with Norma Fredrickson, NP.  I advised husband to keep appointment at this point and to call us tomorrow or  Friday if patient needs to reschedule. Patient's husband verbalized understanding and agreement with plan of care.

## 2012-10-13 NOTE — Telephone Encounter (Signed)
I would agree with this plan.   Thanks  Lawson Fiscal

## 2012-10-13 NOTE — H&P (Signed)
Date: 10/14/2012               Patient Name:  [name] MRN: [id number]  DOB: 09/23/1945 Age / Sex: 67 y.o., female   PCP: [name], MD         Medical Service: Internal Medicine Teaching Service         Attending Physician: Dr. Jonah Blue, DO    First Contact: Dr. Otis Brace Pager: [phone]  Second Contact: Dr. Dede Query Pager: [phone]       After Hours (After 5p/  First Contact Pager: [phone]  weekends / holidays): Second Contact Pager: [phone]   Chief Complaint: Shortness of breath  History of Present Illness:  [name] is a 67 y.o. female PMH moderate aortic stenosis, diastolic CHF (EF 33-29%), COPD (on 2L home O2), chronic venous insufficiency, recent admission from 7/25-7/31/14 for RLE cellulitis complicated by MSSA bacteremia, receiving Ancef x 6 weeks through a PICC, who presents with shortness of breath and increased oxygen dependency.  [name] has noted increased shortness of breath for the past 3 weeks, especially on exertion. Her symptoms began soon after she was discharged from the hospital on 09/23/12. Her dyspnea was manageable with her home O2 @ 2L until about 1 week ago, when she began requiring more. She is now up to 4.5L continuous O2. Last night her oxygen tank was accidentally disconnected, and she became very dyspneic and distressed until her husband could fix it. This prompted her to seek emergency care today.  She also notes chest "tightness", non-radiating, for about 3 weeks, though she is not experiencing it presently. (I saw her s/p 80mg  IV Lasix.) She has baseline orthopnea requiring her to sleep upright in a chair. Denies PND. With regards to her leg swelling, she states it has improved since starting antibiotic treatment for her cellulitis; however, she does have edematous, erythematous legs at baseline 2/2 chronic venous insufficiency. Denies fever, chills. Denies pain or warmth in her legs, but notes they are somewhat crampy  at present. She thinks this is because her potassium is low, as she has had this problem before after taking Lasix. Denies nausea, vomiting, diarrhea, but notes a sensation of fullness and bloating in her abdomen. She also endorses a 20 pound weight gain over the past 2 weeks. Of note, chart review suggests this number is much higher; she is 272lbs presently, increased from 210lbs on 09/20/12. She has been very sedentary since her discharge at the end of July. No personal history of DVT or PE.   Meds: Current Facility-Administered Medications  Medication Dose Route Frequency Provider Last Rate Last Dose  . 0.9 %  sodium chloride infusion  250 mL Intravenous PRN Manuela Schwartz, MD      . ceFAZolin (ANCEF) IVPB 2 g/50 mL premix  2 g Intravenous Q8H Manuela Schwartz, MD   2 g at 10/14/12 0526  . fluconazole (DIFLUCAN) tablet 100 mg  100 mg Oral Daily Manuela Schwartz, MD      . FLUoxetine (PROZAC) capsule 20 mg  20 mg Oral Daily Manuela Schwartz, MD      . furosemide (LASIX) injection 80 mg  80 mg Intravenous BID Manuela Schwartz, MD      . heparin injection 5,000 Units  5,000 Units Subcutaneous Q8H Manuela Schwartz, MD   5,000 Units at 10/14/12 0526  . hydrocerin (EUCERIN) cream 1 application  1 application Topical Daily Manuela Schwartz, MD      .  lactulose (CHRONULAC) 10 GM/15ML solution 10 g  10 g Oral BID PRN Manuela Schwartz, MD      . levothyroxine (SYNTHROID, LEVOTHROID) tablet 137 mcg  137 mcg Oral QAC breakfast Manuela Schwartz, MD   137 mcg at 10/14/12 0528  . metoprolol succinate (TOPROL-XL) 24 hr tablet 12.5 mg  12.5 mg Oral Daily Manuela Schwartz, MD      . morphine (MS CONTIN) 12 hr tablet 60 mg  60 mg Oral Q12H Manuela Schwartz, MD   60 mg at 10/13/12 2249  . ondansetron (ZOFRAN) injection 4 mg  4 mg Intravenous Q6H PRN Manuela Schwartz, MD      . sodium chloride 0.9 % injection 10-40 mL  10-40 mL  Intracatheter Q12H Jonah Blue, DO      . sodium chloride 0.9 % injection 10-40 mL  10-40 mL Intracatheter PRN Jonah Blue, DO      . sodium chloride 0.9 % injection 3 mL  3 mL Intravenous Q12H Manuela Schwartz, MD      . sodium chloride 0.9 % injection 3 mL  3 mL Intravenous PRN Manuela Schwartz, MD      . spironolactone (ALDACTONE) tablet 25 mg  25 mg Oral BID Manuela Schwartz, MD   25 mg at 10/14/12 0053    Allergies: Allergies as of 10/13/2012 - Review Complete 10/13/2012  Allergen Reaction Noted  . Latex    . Penicillins     Past Medical History  Diagnosis Date  . Carotid bruit     0-39% bilateral ICA by dopplers 03/2010  . Aortic stenosis     a. Moderate echo 01/2009, then severe 03/2010 --> last echo 09/2011:  EF 60-65%, grade 2 diastolic dysfunction, severe aortic stenosis, mean gradient 50, mild MR    b. Not an operative candidate due to comorbidities, not a candidate for TAVR given PVD. c. low risk myovue 11/2007 brest attenuation EF  63%  . Venous insufficiency   . Arthritis   . Depression   . COPD (chronic obstructive pulmonary disease)   . Hypothyroidism   . Thrombocytopenia   . Hypokalemia   . Edema   . Respiratory failure     Ventilator-dependent respiratory failure secondary to hypercarbia, cardiac arrest, presumed penumonia 12/2008  . Cardiac arrest     12/2010 in setting of respiratory failure with less than 15 minutes of CPR; initial rhythm was asystole  . PVD (peripheral vascular disease)     Eval by Dr. Excell Seltzer 2012: noninvasive immaging suggested significant aortoiliac disease, ABIs in mild range  - med rx given significant comorbidities  . H/O endoscopy     For ?ampullary mass - had EGD 07/2010 with no evidence of ampullary mass, some CBD dilitation   . Chronic pain   . Lumbar stenosis     Admitted 09/2011 - not felt to be a good candidate for surgical intervention.  . CHF (congestive heart failure)   . Morbid obesity 10/27/2011  .  Chronic diastolic CHF (congestive heart failure) 10/27/2011  . Lower extremity weakness 10/26/2011  . Spinal stenosis of lumbar region 10/25/2011  . ARTHRITIS 02/06/2009    Qualifier: Diagnosis of  By: Kem Parkinson    . COPD 02/06/2009    Qualifier: Diagnosis of  By: Kem Parkinson    . VENOUS INSUFFICIENCY 02/06/2009    Qualifier: Diagnosis of  By: Kem Parkinson    . Diabetes mellitus without complication   . Hypertension    Past Surgical  History  Procedure Laterality Date  . Thyroidectomy    . Laparoscopic cholecystectomy    . Lithotripsy       for nephrolithiasis  . Vaginal hysterectomy       for menorrhagia  at age 47  . Breast cyst excision    . Rotator cuff repair    . Cesarean section    . Laminectomy     Family History  Problem Relation Age of Onset  . Emphysema Father   . Allergies Sister   . Asthma Sister   . Heart disease Father   . Cancer Mother    History   Social History  . Marital Status: Married    Spouse Name: N/A    Number of Children: N/A  . Years of Education: N/A   Occupational History  . Not on file.   Social History Main Topics  . Smoking status: Current Every Day Smoker -- 2.00 packs/day for 45 years    Types: Cigarettes  . Smokeless tobacco: Current User     Comment: PT USES [employer]AP[employer] CIGARETTE  . Alcohol Use: No  . Drug Use: No  . Sexual Activity: Not on file   Other Topics Concern  . Not on file   Social History Narrative   pt is married.   pt has children.   pt is a housewife.    Pt essentially non-ambulatory at least 2-3 years    Review of Systems: Pertinent items are noted in HPI.  Physical Exam: Blood pressure 130/60, pulse 93, temperature 98 F (36.7 C), temperature source Oral, resp. rate 18, height 5' (1.524 m), weight 269 lb 11.2 oz (122.335 kg), SpO2 95.00%. Physical Exam  Constitutional: She is oriented to person, place, and time and well-developed, well-nourished, and in no distress.  Pleasant and  conversant. Not wearing [place] when I walked into the room; O2 sat checked and was 80%. After placing [place] back on, O2 sat quickly increased to 88%. Pt notes baseline is "in the 90's".  HENT:  Head: Normocephalic and atraumatic.  Mouth/Throat: Oropharynx is clear and moist.  Eyes: Conjunctivae and EOM are normal. Pupils are equal, round, and reactive to light.  Neck: Normal range of motion. Neck supple.  Unable to accurately assess JVD 2/2 body habitus.  Cardiovascular: Normal rate, regular rhythm and intact distal pulses.   Murmur (Grade III systolic crescendo-decrescendo murmur heard best at right 2nd intercostal space, radiating to the carotids) heard. 2+ pitting edema to the knees  Pulmonary/Chest: Effort normal. No respiratory distress. She has no wheezes. She has rales (Soft crackles at both bases, R>L). She exhibits no tenderness.  Abdominal: Soft. Bowel sounds are normal. She exhibits no distension. There is no tenderness.  Musculoskeletal: Normal range of motion. She exhibits no tenderness (No calf tenderness).  Neurological: She is alert and oriented to person, place, and time. No cranial nerve deficit. GCS score is 15.  Skin: Skin is warm and dry. She is not diaphoretic.  Erythematous skin changes and stasis dermatitis in BL legs, R>L. Right leg is more swollen than left, also has multiple skin-colored nodules and 1 clear vesicle on the anterior portion that pt says developed after she hit her leg against her wheelchair a few days ago. No drainage, no pain, no warmth. PICC in right arm; site is clean, dry, intact.  Psychiatric: Affect normal.     Lab results: Basic Metabolic Panel:  Recent Labs  16/10/96 1419 10/13/12 2123  NA 139  --   K 3.8  --  CL 100  --   CO2 34*  --   GLUCOSE 104*  --   BUN 13  --   CREATININE 0.73 0.70  CALCIUM 8.5  --   MG  --  1.9   Liver Function Tests:  Recent Labs  10/13/12 1419  AST 43*  ALT <5  ALKPHOS 144*  BILITOT 0.7  PROT 7.0    ALBUMIN 2.5*   CBC:  Recent Labs  10/13/12 1419 10/13/12 2123  WBC 5.2 6.2  NEUTROABS 3.3  --   HGB 10.2* 11.1*  HCT 32.4* 34.8*  MCV 91.5 91.1  PLT 138* 171  Normal diff  Cardiac Enzymes:  Recent Labs  10/14/12 0045  TROPONINI <0.30    BNP:  Recent Labs  10/13/12 1419  PROBNP 694.1*   D-Dimer:  Recent Labs  10/13/12 1419  DDIMER 2.00*   Urinalysis:  Recent Labs  10/13/12 1541  COLORURINE YELLOW  LABSPEC 1.010  PHURINE 6.0  GLUCOSEU NEGATIVE  HGBUR NEGATIVE  BILIRUBINUR NEGATIVE  KETONESUR NEGATIVE  PROTEINUR NEGATIVE  UROBILINOGEN 0.2  NITRITE NEGATIVE  LEUKOCYTESUR NEGATIVE    Imaging results:  Dg Chest 2 View  10/13/2012   CLINICAL DATA:  67 year old female with shortness of breath. Cellulitis.  EXAM: CHEST  2 VIEW  COMPARISON:  09/17/2012 and earlier.  FINDINGS: Right PICC line in place, tip at the level of the lower SVC. Small right pleural effusion new or increased since 09/17/2012. Diffuse increased interstitial opacity also progressed. No pneumothorax. No consolidation. Stable cardiomegaly and mediastinal contours. Stable visualized osseous structures.  IMPRESSION: 1.  Small right pleural effusion is new/ increased.  2. Increased interstitial markings, favor pulmonary interstitial edema. Viral/ atypical infection is less likely.  3. Chronic cardiomegaly.  4. Right PICC line in place, tip at the lower SVC level.   Electronically Signed   By: Augusto Gamble   On: 10/13/2012 15:55   Ct Angio Chest Pe W/cm &/or Wo Cm  10/14/2012   *RADIOLOGY REPORT*  Clinical Data: Shortness of breath and cough.  Hypoxia.  CT ANGIOGRAPHY CHEST  Technique:  Multidetector CT imaging of the chest using the standard protocol during bolus administration of intravenous contrast. Multiplanar reconstructed images including MIPs were obtained and reviewed to evaluate the vascular anatomy.  Contrast: OMNIPAQUE IOHEXOL 350 MG/ML SOLN  Comparison: Chest x-ray dated 10/13/2012   Findings: There is cardiomegaly.  There is a small loculated right pleural effusion.  There is pulmonary vascular congestion of the origin of the main pulmonary arteries suggestive of pulmonary arterial hypertension.    No pulmonary emboli.   There are a few scattered lymph nodes in the mediastinum and left hilum, and none of which are pathologically enlarged.  There is a 3.1 cm soft tissue mass extending behind the right sternoclavicular joint which probably represents a thyroid nodule.  The patient appears to have had previous thyroid surgery on the right and left.  There is slight splenomegaly.  The patient has hepatomegaly with slight nodularity of the liver contour suggesting cirrhosis.  IMPRESSION: 1.  No pulmonary emboli. 2.  Cardiomegaly with right pleural effusion and pulmonary vascular congestion and probable pulmonary arterial hypertension. 3.  Hepatosplenomegaly.  Nodularity of the liver parenchyma consistent with cirrhosis.   Original Report Authenticated By: Francene Boyers, M.D.    Other results: EKG: normal EKG, normal sinus rhythm, unchanged from previous tracings.  Assessment & Plan by Problem: [name] is a 67 y.o. female PMH moderate aortic stenosis, diastolic CHF (  EF 70-75%), COPD (on 2L home O2), chronic venous insufficiency, recent admission from 7/25-7/31/14 for RLE cellulitis complicated by MSSA bacteremia, receiving Ancef x 6 weeks through a PICC, who presents with shortness of breath and increased oxygen dependency x1 week.  #CHF exacerbation - Acute on chronic diastolic heart failure is the most likely cause of dyspnea in this patient with a 62lb weight gain in ~3 weeks, signs of fluid overload on exam including crackles and LE edema, CXR showing pulmonary interstitial edema, and an increased pro-BNP=694. Echo on 09/19/12 showed moderate LVH, hyperdynamic systolic function with EF 70-75%, no wall motion abnormalities, moderate aortic stenosis 2/2 calcified annulus.  -  Admit to IMTS, telemetry - Lasix 80mg  IV BID - Continue home metoprolol, spironolactone - Continue O2 via [place]. Currently on 4L. Goal 92-96%. - Daily weights, strict I/Os - Will contact her cardiologist Dr. Eden Emms tomorrow. Will need to clarify why she is not on an ACE-I at home.  #Elevated d-dimer - Her Wells' score is 3.5, earning points for clinical signs of DVT (asymmetric LE edema, erythema), PE being a likely diagnosis, and immobilization for at least 3 days. This places her in a high risk group with 40.6% chance of PE in an ED population. We will therefore proceed to definitive imaging. Cr 0.70. - CTA to rule out pulmonary embolism - No PE  #Chest tightness - x3weeks, improved after receiving Lasix in the ED. Likely 2/2 dyspnea and pulmonary edema. EKG WNL, but important to rule out coronary ischemia in this patient with significant co-morbidities including obesity, diabetes, heart failure.  - Trending troponins q6h x3  #RLE cellulitis with MSSA bacteremia - Improving. On 6 weeks of IV abx to cover for endocarditis. - Continue home Ancef via PICC - Continue home MS Contin bid  #COPD - PFTs 03/01/2012 shows moderate obstructive lung defect (FEV1/FVC 63%, RV 126%) and severely decreased diffusion capacity (DLCO 49%), no response to bronchodilators. Apparently on no home mediations other than oxygen. Unknown baseline O2 sat. - Goal O2 sat 92-96% - Will monitor for signs/symptoms of hypercapnia  #History of hypokalemia - K=3.8 in ED before Lasix. Patient with leg cramps now. Perhaps this issue is why she is not on an ACE-I. Will clarify with Dr. Eden Emms. - Gave K-dur po - Follow up repeat BMP - Follow up magnesium - 1.9  #DM2 - Not on problem list, but patient says she has it and takes metformin daily. No HgA1C in system. CBG 104. - Checking A1C - CBG AC and QHS - Holding metformin tonight as she is receiving contrast agent for her CTA.  #History of hospitalization for acute  encephalopathy - See EPIC notes from 10/17-10/20/13. LFTs stable. No AMS currently. - Continuing home lactulose  #Hypothryoidism - S/p thyroidectomy. Last TSH 0.153 in 11/2011. - Checking TSH - Continue home synthroid  #Depression - Stable. - Continue prozac  #Diet - Heart healthy  #DVT PPX - subq Heparin  Dispo: Disposition is deferred at this time, awaiting improvement of current medical problems. Anticipated discharge in approximately 1-3 day(s).   The patient does have a current PCP Andrey Campanile Elizebeth Koller, MD) and does need an [facility] hospital follow-up appointment after discharge.  The patient does not have transportation limitations that hinder transportation to clinic appointments.  Signed: Vivi Barrack, MD 10/14/2012, 6:27 AM

## 2012-10-13 NOTE — Telephone Encounter (Signed)
Pt's husband needs to know if he should take pt to ER, sob, using a lot of oxygen, pls advise

## 2012-10-13 NOTE — ED Provider Notes (Signed)
CSN: [id number]     Arrival date & time 10/13/12  1317 History     First MD Initiated Contact with Patient 10/13/12 1343     Chief Complaint  Patient presents with  . Shortness of Breath   (Consider location/radiation/quality/duration/timing/severity/associated sxs/prior Treatment) HPI This is a 67 yo female who presents by EMS with shortness of breath. Patient has a history of COPD and CHF and is on home oxygen. At baseline she is on 2 L of oxygen. Her last several days she's had increasing shortness of breath and her PCP instructed her to increase it to 4 L. The patient states that if she comes off of oxygen she desaturates very quickly and it takes her a long time to recover. This is new for her. She is only recently been put on oxygen. She was recently hospitalized for bacteremia from sepsis. She is currently on antibiotics with PICC line. She denies any chest pain, fever, cough. Past Medical History  Diagnosis Date  . Carotid bruit     0-39% bilateral ICA by dopplers 03/2010  . Aortic stenosis     a. Moderate echo 01/2009, then severe 03/2010 --> last echo 09/2011:  EF 60-65%, grade 2 diastolic dysfunction, severe aortic stenosis, mean gradient 50, mild MR    b. Not an operative candidate due to comorbidities, not a candidate for TAVR given PVD. c. low risk myovue 11/2007 brest attenuation EF  63%  . Venous insufficiency   . Arthritis   . Depression   . COPD (chronic obstructive pulmonary disease)   . Hypothyroidism   . Thrombocytopenia   . Hypokalemia   . Edema   . Respiratory failure     Ventilator-dependent respiratory failure secondary to hypercarbia, cardiac arrest, presumed penumonia 12/2008  . Cardiac arrest     12/2010 in setting of respiratory failure with less than 15 minutes of CPR; initial rhythm was asystole  . PVD (peripheral vascular disease)     Eval by Dr. Excell Seltzer 2012: noninvasive immaging suggested significant aortoiliac disease, ABIs in mild range  - med rx given  significant comorbidities  . H/O endoscopy     For ?ampullary mass - had EGD 07/2010 with no evidence of ampullary mass, some CBD dilitation   . Chronic pain   . Lumbar stenosis     Admitted 09/2011 - not felt to be a good candidate for surgical intervention.  . CHF (congestive heart failure)   . Morbid obesity 10/27/2011  . Chronic diastolic CHF (congestive heart failure) 10/27/2011  . Lower extremity weakness 10/26/2011  . Spinal stenosis of lumbar region 10/25/2011  . ARTHRITIS 02/06/2009    Qualifier: Diagnosis of  By: Kem Parkinson    . COPD 02/06/2009    Qualifier: Diagnosis of  By: Kem Parkinson    . VENOUS INSUFFICIENCY 02/06/2009    Qualifier: Diagnosis of  By: Kem Parkinson    . Diabetes mellitus without complication   . Hypertension    Past Surgical History  Procedure Laterality Date  . Thyroidectomy    . Laparoscopic cholecystectomy    . Lithotripsy       for nephrolithiasis  . Vaginal hysterectomy       for menorrhagia  at age 54  . Breast cyst excision    . Rotator cuff repair    . Cesarean section    . Laminectomy     Family History  Problem Relation Age of Onset  . Emphysema Father   . Allergies Sister   .  Asthma Sister   . Heart disease Father   . Cancer Mother    History  Substance Use Topics  . Smoking status: Current Every Day Smoker -- 2.00 packs/day for 45 years    Types: Cigarettes  . Smokeless tobacco: Current User     Comment: PT USES VAPOR CIGARETTE  . Alcohol Use: No   OB History   Grav Para Term Preterm Abortions TAB SAB Ect Mult Living                 Review of Systems  Constitutional: Negative for fever and chills.  Respiratory: Positive for shortness of breath. Negative for cough and chest tightness.   Cardiovascular: Negative for chest pain.  Gastrointestinal: Negative for nausea, vomiting and abdominal pain.  Genitourinary: Negative for dysuria.  Musculoskeletal: Negative for back pain.  Skin: Negative for rash.   Neurological: Negative for dizziness and syncope.  Psychiatric/Behavioral: Negative for confusion.  All other systems reviewed and are negative.    Allergies  Latex and Penicillins  Home Medications   No current outpatient prescriptions on file. BP 91/72  Pulse 95  Temp(Src) 98.7 F (37.1 C) (Oral)  Resp 22  SpO2 97% Physical Exam  Nursing note and vitals reviewed. Constitutional: She is oriented to person, place, and time. She appears well-developed and well-nourished.  Patient on nasal cannula oxygen, talking in short sentences  HENT:  Head: Normocephalic and atraumatic.  Eyes: Pupils are equal, round, and reactive to light.  Neck: Neck supple.  Cardiovascular: Normal rate, regular rhythm and normal heart sounds.   Pulmonary/Chest: Breath sounds normal. No respiratory distress. She has no wheezes.  Mild increased work of breathing.  Crackles noted at the bases.  Abdominal: Soft. Bowel sounds are normal.  Musculoskeletal: She exhibits edema.  Neurological: She is alert and oriented to person, place, and time.  Skin: Skin is warm and dry. Rash noted.  Cellulitis noted to the left lower extremity.  Psychiatric: She has a normal mood and affect.    ED Course   Date: 10/13/2012  Rate: 88  Rhythm: normal sinus rhythm  QRS Axis: normal  Intervals: normal  ST/T Wave abnormalities: normal  Conduction Disutrbances:none  Narrative Interpretation:   Normal sinus rhythm  Old EKG Reviewed: unchanged  Procedures (including critical care time)  Labs Reviewed  PRO B NATRIURETIC PEPTIDE - Abnormal; Notable for the following:    Pro B Natriuretic peptide (BNP) 694.1 (*)    All other components within normal limits  CBC WITH DIFFERENTIAL - Abnormal; Notable for the following:    RBC 3.54 (*)    Hemoglobin 10.2 (*)    HCT 32.4 (*)    RDW 16.1 (*)    Platelets 138 (*)    All other components within normal limits  COMPREHENSIVE METABOLIC PANEL - Abnormal; Notable for the  following:    CO2 34 (*)    Glucose, Bld 104 (*)    Albumin 2.5 (*)    AST 43 (*)    Alkaline Phosphatase 144 (*)    GFR calc non Af Amer 87 (*)    All other components within normal limits  D-DIMER, QUANTITATIVE - Abnormal; Notable for the following:    D-Dimer, Quant 2.00 (*)    All other components within normal limits  URINALYSIS, ROUTINE W REFLEX MICROSCOPIC   Dg Chest 2 View  10/13/2012   CLINICAL DATA:  67 year old female with shortness of breath. Cellulitis.  EXAM: CHEST  2 VIEW  COMPARISON:  09/17/2012 and  earlier.  FINDINGS: Right PICC line in place, tip at the level of the lower SVC. Small right pleural effusion new or increased since 09/17/2012. Diffuse increased interstitial opacity also progressed. No pneumothorax. No consolidation. Stable cardiomegaly and mediastinal contours. Stable visualized osseous structures.  IMPRESSION: 1.  Small right pleural effusion is new/ increased.  2. Increased interstitial markings, favor pulmonary interstitial edema. Viral/ atypical infection is less likely.  3. Chronic cardiomegaly.  4. Right PICC line in place, tip at the lower SVC level.   Electronically Signed   By: Augusto Gamble   On: 10/13/2012 15:55   1. CHF (congestive heart failure)   2. Hypoxia     MDM  THis is a 67 yo female who presents with worsening SOB and increasing O2 requirement.  Patient with history of CHF.  Low risk for PE so d-dimer sent.  Labs notable for increased BNP and chest xray with pulmonary edema.  Patient given 80 mg IV lasix.  Nitro held given patient's soft BP.  D-dimer was positive; however, given a possible cause for edema, I feel she is a candidate for lower extremity dopplers.  I communicated this to the admitting doctor who will follow-up.  Patient will be admitted for further management.    Shon Baton, MD 10/13/12 [phone]

## 2012-10-13 NOTE — Progress Notes (Signed)
Pt rec from ED Rn. Pt has PICC line from home d/t infection. Pt has no complaints of CP or SOB.  HF booklet giving, HF educations reviewed with Pt. Pt states she is on 2L but became SOB 5days ago and out it on 4L. And weight has up. Pt states she took extra lasix and told HH Rn and call Cardiologist a couple days later.Cardiologist  Set up an office appt, but call back later (unsure of days or hrs) was told to come to ED.  Husband said Pt lost about 10lbs before coming to [facility] with increase in lasix.

## 2012-10-13 NOTE — ED Notes (Signed)
Pt to ED via [facility] for evaluation of shortness of breath.  Pt has hx of COPD and CHF- pt is on 2L of oxygen all the time at home.  Called PCP who instructed her bump oxygen up to 4L, pt has been on 4L O2 for a few days but is still short of breath- PCP instructed pt to come to hospital.  Pt has PICC line, receives at home antibiotics for blood infection.  Pt is speaking in full sentences, no distress noted.  NSR on monitor.

## 2012-10-14 ENCOUNTER — Encounter (HOSPITAL_COMMUNITY): Payer: Self-pay | Admitting: Physician Assistant

## 2012-10-14 DIAGNOSIS — E1051 Type 1 diabetes mellitus with diabetic peripheral angiopathy without gangrene: Secondary | ICD-10-CM | POA: Diagnosis present

## 2012-10-14 DIAGNOSIS — I359 Nonrheumatic aortic valve disorder, unspecified: Secondary | ICD-10-CM

## 2012-10-14 DIAGNOSIS — E1059 Type 1 diabetes mellitus with other circulatory complications: Secondary | ICD-10-CM | POA: Diagnosis present

## 2012-10-14 DIAGNOSIS — I509 Heart failure, unspecified: Secondary | ICD-10-CM | POA: Diagnosis present

## 2012-10-14 DIAGNOSIS — I5033 Acute on chronic diastolic (congestive) heart failure: Principal | ICD-10-CM

## 2012-10-14 LAB — BASIC METABOLIC PANEL
BUN: 13 mg/dL (ref 6–23)
CO2: 32 mEq/L (ref 19–32)
CO2: 38 mEq/L — ABNORMAL HIGH (ref 19–32)
Calcium: 8.4 mg/dL (ref 8.4–10.5)
Calcium: 8.9 mg/dL (ref 8.4–10.5)
Chloride: 92 mEq/L — ABNORMAL LOW (ref 96–112)
Glucose, Bld: 123 mg/dL — ABNORMAL HIGH (ref 70–99)
Glucose, Bld: 197 mg/dL — ABNORMAL HIGH (ref 70–99)
Sodium: 139 mEq/L (ref 135–145)
Sodium: 137 mEq/L (ref 135–145)

## 2012-10-14 LAB — MAGNESIUM
Magnesium: 2 mg/dL (ref 1.5–2.5)
Magnesium: 2 mg/dL (ref 1.5–2.5)

## 2012-10-14 LAB — URINALYSIS, ROUTINE W REFLEX MICROSCOPIC
Bilirubin Urine: NEGATIVE
Leukocytes, UA: NEGATIVE
Nitrite: NEGATIVE
Specific Gravity, Urine: 1.014 (ref 1.005–1.030)
Urobilinogen, UA: 1 mg/dL (ref 0.0–1.0)
pH: 7.5 (ref 5.0–8.0)

## 2012-10-14 LAB — HEMOGLOBIN A1C: Hgb A1c MFr Bld: 6.7 % — ABNORMAL HIGH (ref <5.7)

## 2012-10-14 LAB — GLUCOSE, CAPILLARY
Glucose-Capillary: 126 mg/dL — ABNORMAL HIGH (ref 70–99)
Glucose-Capillary: 120 mg/dL — ABNORMAL HIGH (ref 70–99)
Glucose-Capillary: 157 mg/dL — ABNORMAL HIGH (ref 70–99)
Glucose-Capillary: 137 mg/dL — ABNORMAL HIGH (ref 70–99)

## 2012-10-14 LAB — TROPONIN I
Troponin I: <0.3 ng/mL (ref <0.30)
Troponin I: <0.3 ng/mL (ref <0.30)

## 2012-10-14 MED ORDER — CEFAZOLIN SODIUM-DEXTROSE 2-3 GM-% IV SOLR
2.0000 g | Freq: Three times a day (TID) | INTRAVENOUS | Status: DC
  Administered 2012-10-14 – 2012-10-18 (×12): 2 g via INTRAVENOUS
  Filled 2012-10-14 (×15): qty 50

## 2012-10-14 MED ORDER — FUROSEMIDE 10 MG/ML IJ SOLN
10.0000 mg/h | INTRAVENOUS | Status: DC
  Administered 2012-10-14 – 2012-10-15 (×2): 15 mg/h via INTRAVENOUS
  Administered 2012-10-17 – 2012-10-19 (×3): 10 mg/h via INTRAVENOUS
  Filled 2012-10-14 (×10): qty 25

## 2012-10-14 MED ORDER — SODIUM CHLORIDE 0.9 % IJ SOLN: 10.0000 mL | Freq: Two times a day (BID) | INTRAMUSCULAR | Status: DC

## 2012-10-14 MED ORDER — SODIUM CHLORIDE 0.9 % IJ SOLN
10.0000 mL | INTRAMUSCULAR | Status: DC | PRN
  Administered 2012-10-16 – 2012-10-18 (×5): 10 mL
  Administered 2012-10-19: 30 mL

## 2012-10-14 MED ORDER — METOLAZONE 2.5 MG PO TABS
2.5000 mg | ORAL_TABLET | Freq: Two times a day (BID) | ORAL | Status: DC
  Administered 2012-10-14 – 2012-10-17 (×7): 2.5 mg via ORAL
  Filled 2012-10-14 (×8): qty 1

## 2012-10-14 NOTE — H&P (Signed)
INTERNAL MEDICINE TEACHING SERVICE Attending Admission Note  Date: 10/14/2012  Patient name: [name]  Medical record number: [id number]  Date of birth: 12-03-1945    I have seen and evaluated [name] and discussed their care with the Residency Team.    67 yr old female with severe AS, HFpEF (Chronic diastolic heart failure), morbid obesity Body mass index is 52.67 kg/(m^2)., Chronic respiratory failure due to COPD on home O2, chronic pain syndrome, with SOB and weight gain. She has evidence of acute on chronic diastolic heart failure.  She is currently on treatment for MSSA bacteremia and Right LE cellulitis via PICC line.  She has not been a candidate for TAVR.  She feels slightly better today after being started on IV diuretics, as she had a 20-30 lb weight gain recently. We have consulted HF team for this complex patient.  She will continue on Lasix 15 mg/hr and metolazone. Her I/O's will need to be carefully monitored as well as daily weights. Monitor BMP, Mg twice daily.    Jonah Blue, DO, FACP Faculty [facility] Internal Medicine Residency Program 10/14/2012, 1:46 PM

## 2012-10-14 NOTE — Progress Notes (Signed)
[facility]  Patient Status: Active (receiving services up to time of hospitalization)  [facility] is providing the following services: RN, PT, MSW, HHA and Home Infusion Services (teaching and education will be done by nurse in the home with patient and caregiver)  If patient discharges after hours, please call [phone].   [name] 10/14/2012, 1:38 PM

## 2012-10-14 NOTE — Progress Notes (Signed)
Utilization Review Completed.   Kyarah Enamorado, RN, BSN Nurse Case Manager  [phone]  

## 2012-10-14 NOTE — Progress Notes (Signed)
Foley cath place per cards for accurate out over the next two day w/lasix drip, sterile tach use, urine returned balloon 10cc. UA sent d/t vag discharge seen on incretion

## 2012-10-14 NOTE — Progress Notes (Signed)
Pt complaining of pain and hunger. MD notified and stated the night team will round on pt. Baron Hamper, RN

## 2012-10-14 NOTE — Evaluation (Signed)
Physical Therapy Evaluation Patient Details Name: [name] MRN: [id number] DOB: 03-09-1945 Today's Date: 10/14/2012 Time: 4098-1191 PT Time Calculation (min): 33 min  PT Assessment / Plan / Recommendation History of Present Illness  67 yr old female with severe AS, HFpEF (Chronic diastolic heart failure), morbid obesity Body mass index is 52.67 kg/(m^2)., Chronic respiratory failure due to COPD on home O2, chronic pain syndrome, with SOB and weight gain. She has evidence of acute on chronic diastolic heart failure.  She is currently on treatment for MSSA bacteremia and Right LE cellulitis via PICC line.  She has not been a candidate for TAVR.  She feels slightly better today after being started on IV diuretics, as she had a 20-30 lb weight gain recently.  Clinical Impression  Pt admitted with the above. Pt currently with functional limitations due to the deficits listed below (see PT Problem List). Limited due to generalized weakness and decrease in Sa02 with ambulation.  Pt will benefit from skilled PT to increase their independence and safety with mobility to allow discharge to the venue listed below.      PT Assessment  Patient needs continued PT services    Follow Up Recommendations  Home health PT;Supervision/Assistance - 24 hour    Equipment Recommendations  Other (comment) (wide BSC)    Frequency Min 3X/week    Precautions / Restrictions Precautions Precautions: Fall Restrictions Weight Bearing Restrictions: No   Pertinent Vitals/Pain Sa02 decreased to 88% on 2L with ambulation then increased to 4L for Sa02 to slowly increase to 96%      Mobility  Bed Mobility Sit to Supine: 4: Min guard;With rail Details for Bed Mobility Assistance: Minguard for safety Transfers Transfers: Sit to Stand;Stand to Sit Sit to Stand: 4: Min assist;From chair/3-in-1;From toilet Stand to Sit: 4: Min assist;To toilet;To bed Details for Transfer Assistance: VCs for safe hand placement  and to control descent. Ambulation/Gait Ambulation/Gait Assistance: 4: Min guard Ambulation Distance (Feet): 20 Feet Assistive device: Rolling walker Ambulation/Gait Assistance Details: VCs for upright posture and RW placement.   Gait Pattern: Step-through pattern;Decreased stride length;Trunk flexed Gait velocity: decreased General Gait Details: steady with heavy reliance n rw. VCs for positioning within rw and cues to not abandon rw during functional activities such as approaching the sink Stairs: No Wheelchair Mobility Wheelchair Mobility: No     PT Diagnosis: Difficulty walking;Generalized weakness  PT Problem List: Decreased strength;Decreased range of motion;Decreased activity tolerance;Decreased balance;Decreased mobility;Decreased knowledge of use of DME PT Treatment Interventions: DME instruction;Gait training;Stair training;Functional mobility training;Therapeutic activities;Therapeutic exercise;Balance training;Patient/family education     PT Goals(Current goals can be found in the care plan section) Acute Rehab PT Goals Patient Stated Goal: to go home PT Goal Formulation: With patient Time For Goal Achievement: 10/21/12 Potential to Achieve Goals: Fair  Visit Information  Last PT Received On: 10/14/12 Assistance Needed: +1 History of Present Illness: 67 yr old female with severe AS, HFpEF (Chronic diastolic heart failure), morbid obesity Body mass index is 52.67 kg/(m^2)., Chronic respiratory failure due to COPD on home O2, chronic pain syndrome, with SOB and weight gain. She has evidence of acute on chronic diastolic heart failure.  She is currently on treatment for MSSA bacteremia and Right LE cellulitis via PICC line.  She has not been a candidate for TAVR.  She feels slightly better today after being started on IV diuretics, as she had a 20-30 lb weight gain recently.       Prior Functioning  Home Living Family/patient  expects to be discharged to:: Private  residence Living Arrangements: Spouse/significant other Available Help at Discharge: Family Type of Home: House Home Access: Stairs to enter [employer] of Steps: 5 in the front, 5-6 in the back  Entrance Stairs-Rails: Right;Left Home Layout: One level Home Equipment: Bedside commode;Walker - 4 wheels;Wheelchair - Pharmacist, hospital Comments: wheelchair is borrowed. has been using a maskshift ramp to get into/out of house, husband as been pushing pt in and out. HHA 2 x/week; HHRN 1x/week,  Prior Function Level of Independence: Needs assistance Gait / Transfers Assistance Needed: short distance amulation ~10' with RW ADL's / Homemaking Assistance Needed: Husband assists with ADLs, however did not specify level of assist. Comments: Has 2 cats, likes cards.  Communication Communication: No difficulties Dominant Hand: Right    Cognition  Cognition Arousal/Alertness: Awake/alert Behavior During Therapy: WFL for tasks assessed/performed Overall Cognitive Status: Within Functional Limits for tasks assessed    Extremity/Trunk Assessment Lower Extremity Assessment Lower Extremity Assessment: Generalized weakness (right wound; venous insufficiency bilateral LE)   Balance    End of Session PT - End of Session Equipment Utilized During Treatment: Oxygen (3L Home O2) Activity Tolerance: Patient limited by fatigue Patient left: in chair;with call bell/phone within reach;with family/visitor present Nurse Communication: Mobility status  GP     Victorine Mcnee 10/14/2012, 4:41 PM  Jake Shark, PT DPT [phone]

## 2012-10-14 NOTE — Care Management Note (Signed)
    Page 1 of 1   10/14/2012     1:38:44 PM   CARE MANAGEMENT NOTE 10/14/2012  Patient:  [name], [name]   Account Number:  [id number]  Date Initiated:  10/14/2012  Documentation initiated by:  Tera Mater  Subjective/Objective Assessment:   67yo female admitted with CHF.  Pt. lives at home with family.     Action/Plan:   currently pt. has PICC line and has IV antibiotics at home with [facility].   Anticipated DC Date:  10/16/2012   Anticipated DC Plan:  HOME W HOME HEALTH SERVICES      DC Planning Services  CM consult      [facility] Choice  Resumption Of Svcs/PTA Provider   Choice offered to / List presented to:             [facility] agency  [facility]   Status of service:  In process, will continue to follow Medicare Important Message given?   (If response is "NO", the following Medicare IM given date fields will be blank) Date Medicare IM given:   Date Additional Medicare IM given:    Discharge Disposition:    Per UR Regulation:  Reviewed for med. necessity/level of care/duration of stay  If discussed at Long Length of Stay Meetings, dates discussed:    Comments:  10/14/12 1330 Noted CM referrral for heart failure home health screen.  Pt. is currently active with [facility] for [facility] RN, PT, nurse's aide, and CSW.  Will obtain resumption of care orders.  TC to Lupita Leash, with [facility], to make aware of hospital admission.  Pt. was receiving IV antibiotics in home via PICC line.  May benefit from PT/OT evaluation for discharge disposition.  NCM will continue to monitor for dc needs. Tera Mater, RN, BSN NCM [phone]

## 2012-10-14 NOTE — Consult Note (Signed)
CARDIOLOGY CONSULT NOTE  Patient ID: [name], MRN: [id number], DOB/AGE: February 03, 1946 67 y.o. Admit date: 10/13/2012   Date of Consult: 10/14/2012 Primary Physician: [name], MD Primary Cardiologist: Eden Emms. Also seen 02/2012 by Cooper/Owen in TAVR clinic  Chief Complaint: SOB, a/c LEE,  weight gain 20-30lb Reason for Consult: diastolic CHF, known AS  HPI: [name] is a 67 y/o F with morbid obesity, severe AS, normal coronaries 2010, multifactorial chronic diastolic CHF, chronic venous insufficiency/edema, history of VDRF, PVD (med rx), spinal stenosis (uses mostly wheelchair), COPD on home O2, and chronic pain requiring narcotics whom we are asked to see for CHF. In the past, volume management has been difficult. She was hospitalized twice in the latter half of 2013, first for severe back pain and lower extremity numbness felt to be a poor lumbar surgical candidate due to comorbidities, and a second time with altered mental status and generalized weakness.She was recently discharged July 2014 for MSSA bacteremia and R leg cellulitis. She previously carries history of severe AS, with most recent echo (TTE) last admission 08/2012 reporting moderate AS (Vmax 1.1cm, peak grad , mean grad , cusp separation moderately reduced), EF 70-75%, nodular thickening of submitral apparatus with mild MR. Due to comorbidities, she has not been a candidate for traditional AVR. In July 2013, she was not felt to be a candidate for TAVR due to PVD, but since that time saw Dr. Elmon Else in the Multidisciplinary T[facility]R Clinic in 02/2012. A major issue related to her AS was cardiac risk of lumbar surgery for relief of spinal stenosis. The patient felt perhaps she could gain some functional ability if she were able to have back surgery, but on the other hand she's been severely limited physically for years with contribution from other medical problems. The possibility of TAVR was discussed (possible  limited benefit in light of extremely poor functional capacity) but at that time time patient elected to hold off. During her admission last month for bacteremia, notes indicate the primary team conferred with cardiology regarding TEE - "spoke with cardiology- not a candidate for TEE nor valve replacement/repair." She is due to continue cefazolin for the bacteremia through Aug 31 via PICC.  She presented to [facility] yesterday with complaints of dyspnea, increased oxygen dependency, and significant weight gain of 20-30lb. Her symptoms began soon after she was discharged from the hospital on 09/23/12. Her dyspnea was manageable with her home O2 @ 2L until about 1 week ago when she began requiring more, up to 4.5L. Unfortunately she has not followed up as an outpatient and did notify our office of increasing symptoms or weight gain. She did uptitrate her Lasix from 80/40 to 80mg  BID with 7lb weight loss prior to admission. The night prior to admission, her oxygen tank was accidentally disconnected, and she became very dyspneic and distressed until her husband could fix it. This prompted her to seek emergency care. She has baseline orthopnea requiring her to sleep upright in a chair. She reported increased LEE from baseline. Reports med compliance. She was started on Lasix 80mg  IV BID. pBNP 694. troponins neg x 2. D-dimer elevated, CTA with no PE, but did show small loculated R pleural effusion, probable PAH, hepatosplenomegaly, nodulary of the liver parenchyma consistent with cirrhosis, and 3.1cm soft tissue mass behind [place] joint probably representing thyroid nodule. With diuresis she feels less dyspneic and more energetic today. She also noted chest "tightness" prior to admission, non-radiating, for about 3 weeks which has resolved with diuresis.  WEIGHTS: Previous weight: 210 on 09/20/12 but doubtful - most other weights 230-240 range Admit: 272 Today: 269 I/O - 1230 thus far   Past Medical History  Diagnosis  Date  . Carotid bruit     0-39% bilateral ICA by dopplers 03/2010  . Aortic stenosis     a. Moderate echo 01/2009, then severe 03/2010 --> last echo 09/2011:  EF 60-65%, grade 2 diastolic dysfunction, severe aortic stenosis, mean gradient 50, mild MR    b. Not an operative candidate due to comorbidities, seen in TAVR clinic 02/2012 but pt deferred further testing. c. low risk myovue 11/2007 breast attenuation EF 63%  . Venous insufficiency   . Arthritis   . Depression   . COPD (chronic obstructive pulmonary disease)   . Hypothyroidism   . Thrombocytopenia   . Hypokalemia   . Edema   . Respiratory failure     Ventilator-dependent respiratory failure secondary to hypercarbia, cardiac arrest, presumed penumonia 12/2008  . Cardiac arrest     12/2010 in setting of respiratory failure with less than 15 minutes of CPR; initial rhythm was asystole  . PVD (peripheral vascular disease)     Eval by Dr. Excell Seltzer 2012: noninvasive immaging suggested significant aortoiliac disease, ABIs in mild range  - med rx given significant comorbidities  . H/O endoscopy     For ?ampullary mass - had EGD 07/2010 with no evidence of ampullary mass, some CBD dilitation   . Chronic pain   . Lumbar stenosis     Admitted 09/2011 - not felt to be a good candidate for surgical intervention.  . CHF (congestive heart failure)   . Morbid obesity 10/27/2011  . Chronic diastolic CHF (congestive heart failure) 10/27/2011  . Lower extremity weakness 10/26/2011  . Spinal stenosis of lumbar region 10/25/2011  . Diabetes mellitus   . Hypertension   . Bacteremia     a. Adm 08/2012 with MSSA bacteremia, R leg cellulitis. TTE no endocarditis. TEE deferred. 6 weeks abx.  . Yeast infection     a. Abdominal.  . Cirrhosis     a. By CT 09/2012.  Marland Kitchen Hepatosplenomegaly     a. By CT 09/2012.      Most Recent Cardiac Studies: 2D Echo 09/19/12 - Left ventricle: The cavity size was normal. There was moderate concentric hypertrophy. Systolic function  was hyperdynamic. The estimated ejection fraction was in the range of 70% to 75%. Wall motion was normal; there were no regional wall motion abnormalities. - Aortic valve: Mildly to moderately calcified annulus. Moderately thickened, moderately calcified leaflets. Cusp separation was moderately reduced. There was moderate stenosis. Trivial regurgitation. Valve area: 1.39cm^2(VTI). Valve area: 1.1cm^2 (Vmax). Mean graduent . Peak . - Mitral valve: Calcified annulus. Mildly thickened leaflets . Nodular thickening of the submitral apparatus, possiblyrepresenting calcification. Mild regurgitation. Valve area by continuity equation (using LVOT flow): 2.2cm^2. - Left atrium: The atrium was moderately dilated. - Atrial septum: No defect or patent foramen ovale was identified.  2D echo 10/25/11 Study Conclusions - Left ventricle: The cavity size was normal. Systolic function was normal. The estimated ejection fraction was in the range of 60% to 65%. Wall motion was normal; there were no regional wall motion abnormalities. Features are consistent with a pseudonormal left ventricular filling pattern, with concomitant abnormal relaxation and increased filling pressure (grade 2 diastolic dysfunction). Doppler parameters are consistent with elevated ventricular end-diastolic filling pressure. - Aortic valve: There was severe stenosis. Mean gradient: 50mm Hg (S). Peak gradient: 91mm Hg (S).  Valve area: 0.94cm^2(VTI). Valve area: 0.77cm^2 (Vmax). - Mitral valve: Leaflet separation was reduced. Mobility was restricted. Mild regurgitation. Mean gradient: 6mm Hg (D). Valve area by pressure half-time: 2.12cm^2. Valve area by continuity equation (using LVOT flow): 1.45cm^2.  - Left atrium: The atrium was moderately dilated.  Cardiac Cath 01/2009  CONCLUSION:  1. Normal coronary angiography.  2. Moderate aortic stenosis with a mean aortic valve gradient of 21  mmHg and a calculated aortic valve  area of 0.9 to 1.2 cm2.  3. Evidence of diastolic dysfunction and diastolic heart failure with  an LVEDP of 33 and a pulmonary wedge of 22 to 31.  4. Pulmonary vascular disease with elevated pulmonary vascular  resistance of 3.6 associated with known COPD.  Dr. Earmon Phoenix note from TAVR Clinic 02/2012: ASSESSMENT AND PLAN:  This is a 67 year-old woman with severe aortic stenosis on a background of multiple medical comorbidities. She was seen in conjunction today with Dr Cornelius Moras.  She does have symptoms that are likely related to valvular heart disease. A major issue related to her AS is cardiac risk of lumbar surgery for relief of spinal stenosis. The patient feels that she could gain some functional ability if she were able to have surgery. However, it is concerning that she has been extremely limited physically for several years, and she undoubtedly has many other problems that will continue to keep her at a low functional capacity. Our concerns were discussed with the patient and her family at length today and they understand the complexity of her situation. All of her imaging data was reviewed, as were her hospital and outpatient records. She is clearly not a candidate for open AVR, and even TAVR may have limited benefit in light of her extremely poor functional capacity. If she desires to pursue treatment, next steps would be cardiac CTA to evaluate her aortic annulus and possibly TEE to better assess her LVOT as there is significant basal septal hypertrophy present. She would like to think things over and will return in several weeks for follow-up evaluation.  [name]   [name] July note from TAVR Clinic 02/2012: I spent in excess of 45 minutes with the patient, her husband, and her daughter in the clinic discussing the matters regarding her severe aortic stenosis as well as her other ongoing medical problems. The high-risk nature of any type of surgical intervention has been discussed in detail.  Long-term prognosis with medical therapy was discussed. Alternative approaches such as conventional aortic valve replacement, transcatheter aortic valve replacement, and palliative medical therapy were compared and contrasted at length. This discussion was placed in the context of the patient's own specific clinical presentation and past medical history. I have made it clear that I would not consider the patient candidate for conventional aortic valve replacement, and time skeptical that transcatheter aortic valve replacement would significantly improve her current quality of life. However, the patient is still interested in considering an aggressive approach to her treatment, although she has expressed understanding of my concerns as outlined. As a next step I think it is reasonable to discuss her problems with chronic pain and lower extremity weakness with her neurosurgeon to see if she might expect a reasonable chance for improvement following decompression of her spinal stenosis. If so we could consider transcatheter aortic valve replacement as a means to improve her symptoms of congestive heart failure and decrease risks associated with general anesthesia required for spinal stenosis surgery. Transesophageal echocardiogram might be helpful to further evaluate the  patient's aortic stenosis and in particular the possibility of dynamic left ventricular output tract obstruction. Finally, CT angiogram will be necessary to further assess anatomical constraints for possible transcatheter aortic valve replacement. We will plan to see the patient back in the next several weeks to discuss matters further.    Surgical History:  Past Surgical History  Procedure Laterality Date  . Thyroidectomy    . Laparoscopic cholecystectomy    . Lithotripsy       for nephrolithiasis  . Vaginal hysterectomy       for menorrhagia  at age 71  . Breast cyst excision    . Rotator cuff repair    . Cesarean section    .  Laminectomy       Home Meds: Prior to Admission medications   Medication Sig Start Date End Date Taking? Authorizing Provider  acetaminophen (TYLENOL) 325 MG tablet Take 650 mg by mouth every 6 (six) hours as needed (headache).   Yes Historical Provider, MD  ALPRAZolam Prudy Feeler) 1 MG tablet Take 1 mg by mouth 3 (three) times daily as needed for anxiety.  02/04/12  Yes Historical Provider, MD  aspirin 325 MG tablet Take 1 tablet (325 mg total) by mouth daily. 12/14/11  Yes Joseph Art, DO  ceFAZolin (ANCEF) 2-3 GM-% SOLR Inject 50 mLs (2 g total) into the vein every 8 (eight) hours. X 6 weeks 09/23/12  Yes Ripudeep Jenna Luo, MD  famotidine (PEPCID) 20 MG tablet Take 1 tablet (20 mg total) by mouth daily. For indigestion 11/07/11 11/06/12 Yes Evlyn Kanner Love, PA-C  fluconazole (DIFLUCAN) 100 MG tablet Take 100 mg by mouth daily. 10/12/12  Yes Cliffton Asters, MD  FLUoxetine (PROZAC) 20 MG capsule Take 20 mg by mouth daily.    Yes Historical Provider, MD  furosemide (LASIX) 80 MG tablet PATIENT RECENTLY INCREASED DOSE TO 80mg  TWICE A DAY   Yes Historical Provider, MD  hydrocerin (EUCERIN) CREA Apply 1 application topically daily. 09/23/12  Yes Ripudeep Jenna Luo, MD  levothyroxine (SYNTHROID, LEVOTHROID) 137 MCG tablet Take 137 mcg by mouth daily.   Yes Historical Provider, MD  metFORMIN (GLUCOPHAGE) 1000 MG tablet Take 1,000 mg by mouth 2 (two) times daily with a meal.   Yes Historical Provider, MD  metoprolol succinate (TOPROL-XL) 25 MG 24 hr tablet Take 12.5 mg by mouth daily.    Yes Historical Provider, MD  morphine (MS CONTIN) 60 MG 12 hr tablet Take 60 mg by mouth every 12 (twelve) hours.    Yes Historical Provider, MD  morphine (MSIR) 15 MG tablet Take 15 mg by mouth every 4 (four) hours as needed. pain   Yes Historical Provider, MD  potassium chloride (K-DUR,KLOR-CON) 10 MEQ tablet Take 20 mEq by mouth 2 (two) times daily.    Yes Historical Provider, MD  senna-docusate (SENOKOT-S) 8.6-50 MG per tablet  Take 4 tablets by mouth daily. For constipation. 11/07/11 11/06/12 Yes Evlyn Kanner Love, PA-C  sodium chloride 0.9 % injection 10-40 mLs by Intracatheter route as needed (flush). 09/23/12  Yes Ripudeep Jenna Luo, MD  spironolactone (ALDACTONE) 25 MG tablet patient states she's been taking 25mg  ONCE A day 12/14/11  Yes Jessica U Vann, DO  lactulose (CHRONULAC) 10 GM/15ML solution Take 15 mLs (10 g total) by mouth 2 (two) times daily as needed (constipation). 12/14/11   Joseph Art, DO    Inpatient Medications:  . ceFAZolin  2 g Intravenous Q8H  . fluconazole  100 mg Oral Daily  . FLUoxetine  20 mg Oral Daily  . furosemide  80 mg Intravenous BID  . heparin  5,000 Units Subcutaneous Q8H  . hydrocerin  1 application Topical Daily  . levothyroxine  137 mcg Oral QAC breakfast  . metoprolol succinate  12.5 mg Oral Daily  . morphine  60 mg Oral Q12H  . sodium chloride  10-40 mL Intracatheter Q12H  . sodium chloride  3 mL Intravenous Q12H  . spironolactone  25 mg Oral BID      Allergies:  Allergies  Allergen Reactions  . Latex     REACTION: itch  . Penicillins     REACTION: hives - tolerates Ancef    History   Social History  . Marital Status: Married    Spouse Name: N/A    Number of Children: N/A  . Years of Education: N/A   Occupational History  . Not on file.   Social History Main Topics  . Smoking status: Current Every Day Smoker -- 2.00 packs/day for 45 years    Types: Cigarettes  . Smokeless tobacco: Current User     Comment: PT USES VAPOR CIGARETTE  . Alcohol Use: No  . Drug Use: No  . Sexual Activity: Not on file   Other Topics Concern  . Not on file   Social History Narrative   pt is married.   pt has children.   pt is a housewife.    Pt essentially non-ambulatory at least 2-3 years     Family History  Problem Relation Age of Onset  . Emphysema Father   . Allergies Sister   . Asthma Sister   . Heart disease Father   . Cancer Mother      Review of  Systems: General: negative for chills, fever, night sweats  Cardiovascular: see above.  Dermatological: LE erythema Respiratory: negative for cough or wheezing Urologic: negative for hematuria Abdominal: negative for nausea, vomiting, diarrhea, bright red blood per rectum, melena, or hematemesis Neurologic: negative for visual changes, syncope, or dizziness All other systems reviewed and are otherwise negative except as noted above.  Labs:  Recent Labs  10/14/12 0045 10/14/12 0454  TROPONINI <0.30 <0.30   Lab Results  Component Value Date   WBC 6.2 10/13/2012   HGB 11.1* 10/13/2012   HCT 34.8* 10/13/2012   MCV 91.1 10/13/2012   PLT 171 10/13/2012    Recent Labs Lab 10/13/12 1419  10/14/12 0500  NA 139  --  139  K 3.8  --  3.9  CL 100  --  99  CO2 34*  --  32  BUN 13  --  13  CREATININE 0.73  < > 0.78  CALCIUM 8.5  --  8.4  PROT 7.0  --   --   BILITOT 0.7  --   --   ALKPHOS 144*  --   --   ALT <5  --   --   AST 43*  --   --   GLUCOSE 104*  --  123*  < > = values in this interval not displayed.  Lab Results  Component Value Date   DDIMER 2.00* 10/13/2012    Radiology/Studies:  Dg Chest 2 View  10/13/2012   CLINICAL DATA:  67 year old female with shortness of breath. Cellulitis.  EXAM: CHEST  2 VIEW  COMPARISON:  09/17/2012 and earlier.  FINDINGS: Right PICC line in place, tip at the level of the lower SVC. Small right pleural effusion new or increased since 09/17/2012. Diffuse increased interstitial opacity  also progressed. No pneumothorax. No consolidation. Stable cardiomegaly and mediastinal contours. Stable visualized osseous structures.  IMPRESSION: 1.  Small right pleural effusion is new/ increased.  2. Increased interstitial markings, favor pulmonary interstitial edema. Viral/ atypical infection is less likely.  3. Chronic cardiomegaly.  4. Right PICC line in place, tip at the lower SVC level.   Electronically Signed   By: Augusto Gamble   On: 10/13/2012 15:55    Ct  Angio Chest Pe W/cm &/or Wo Cm 10/14/2012   *RADIOLOGY REPORT*  Clinical Data: Shortness of breath and cough.  Hypoxia.  CT ANGIOGRAPHY CHEST  Technique:  Multidetector CT imaging of the chest using the standard protocol during bolus administration of intravenous contrast. Multiplanar reconstructed images including MIPs were obtained and reviewed to evaluate the vascular anatomy.  Contrast: OMNIPAQUE IOHEXOL 350 MG/ML SOLN  Comparison: Chest x-ray dated 10/13/2012  Findings: There is cardiomegaly.  There is a small loculated right pleural effusion.  There is pulmonary vascular congestion of the origin of the main pulmonary arteries suggestive of pulmonary arterial hypertension.    No pulmonary emboli.   There are a few scattered lymph nodes in the mediastinum and left hilum, and none of which are pathologically enlarged.  There is a 3.1 cm soft tissue mass extending behind the right sternoclavicular joint which probably represents a thyroid nodule.  The patient appears to have had previous thyroid surgery on the right and left.  There is slight splenomegaly.  The patient has hepatomegaly with slight nodularity of the liver contour suggesting cirrhosis.  IMPRESSION: 1.  No pulmonary emboli. 2.  Cardiomegaly with right pleural effusion and pulmonary vascular congestion and probable pulmonary arterial hypertension. 3.  Hepatosplenomegaly.  Nodularity of the liver parenchyma consistent with cirrhosis.   Original Report Authenticated By: Francene Boyers, M.D.   EKG: NSR 88bpm no acute change from prior, PAC, QTc 478  Physical Exam: Blood pressure 110/74, pulse 93, temperature 98 F (36.7 C), temperature source Oral, resp. rate 18, height 5' (1.524 m), weight 269 lb 11.2 oz (122.335 kg), SpO2 99.00%. General: Well developed, obese WF in no acute distress. Head: Normocephalic, atraumatic, sclera non-icteric, no xanthomas, nares are without discharge.  Neck: JVD not elevated. Bilateral carotid bruits, question  radiation from murmur. Lungs: Diminished BS at bases and coarse otherwise. Breathing is unlabored. Heart: RRR with S1 S2. 2/6 SEM RUSB. Unable to fully auscultate S2 clearly. Abdomen: Soft, non-tender, non-distended with normoactive bowel sounds. No hepatomegaly. No rebound/guarding. No obvious abdominal masses. Msk:  Strength and tone appear normal for age. Extremities: No clubbing or cyanosis. Bilateral LE erythematous skin changes and significant LEE R>L with cracking of the skin. RLE is wrapped.  Neuro: Alert and oriented X 3. No facial asymmetry. No focal deficit. Moves all extremities spontaneously. Psych:  Responds to questions appropriately with a pleasant affect.   Assessment and Plan:  1. 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 Mod-severe aortic stenosis 3. Chronic venous stasis and lower extremity edema 4. Chest tightness, likely related to volume overload 5. COPD on home O2  6. Hypoalbuminemia 7. Chronic debility / lumbar stenosis  8. Hypothyroidism with ? 3.1cm thyroid nodule 9. PAH by CT angio 10. Hepatosplenomegaly & cirrhosis by CTA 11. Recent MSSA bacteremia and R leg cellulits, abx through 10/24/12 via PICC  Patient seen and examined with Dr. Gala Romney, discussed extensively. She remains very volume overloaded. Hepatic changes likely reflective of cardiac congestion. Will change to Lasix gtt  at 15mg /hr and add metolazone 2.5mg  BID. Place foley. Will need to follow renal function and blood pressure closely particularly with known AS. Outpatient followup will be extremely important for this patient. She was educated to notify her doctor for early weight gain. Care management has been consulted. Medical issues per IM. She is still unsure about idea of pursuing intervention on her aortic valve. We can revisit this in the future, but would have to take a multidisciplinary approach with pulmonary involvement as well.  Signed, Ronie Spies PA-C 10/14/2012, 11:00 AM   Patient seen and examined with Ronie Spies, PA-C. We discussed all aspects of the encounter. I agree with the assessment and plan as stated above.   Very complicated situation. She has significant volume overload in the setting of moderate-severe AS, diastolic dysfunction, morbid obesity, COPD and severe deconditioning. We will diurese her with lasix gtt and metolazone and see how she responds.   We had a long talk about whether or not she would be a candidate for AVR or TAVR in future. I reinforced need for her to lose weight and participate in physical therapy once diuresed.   Lowana Hable,MD 4:22 PM

## 2012-10-14 NOTE — Progress Notes (Signed)
Subjective:  Pt seen and examined in AM. Feeling better today. Still with dyspnea but improved. No CP or tightness. No abdominal pain, nausea, vomiting, changes in BM or urination.  Objective: Vital signs in last 24 hours: Filed Vitals:   10/14/12 0434 10/14/12 0951 10/14/12 1300 10/14/12 2015  BP: 130/60 110/74 103/45 126/50  Pulse: 93 93 89 86  Temp: 98 F (36.7 C)  98 F (36.7 C) 98.9 F (37.2 C)  TempSrc: Oral  Oral Oral  Resp: 18 18 18 18   Height:      Weight:      SpO2: 95% 99% 98% 95%   Weight change:   Intake/Output Summary (Last 24 hours) at 10/14/12 2026 Last data filed at 10/14/12 1800  Gross per 24 hour  Intake 1588.83 ml  Output   5400 ml  Net -3811.17 ml   Head: Normocephalic and atraumatic.  Mouth/Throat: Oropharynx is clear and moist.  Eyes: Conjunctivae and EOM are normal. Pupils are equal, round, and reactive to light.  Neck: Normal range of motion. Neck supple.  Unable to accurately assess JVD 2/2 body habitus.  Cardiovascular: Normal rate, regular rhythm and intact distal pulses. Systolic III/VI murmur, 2+ pitting edema to the knees  Pulmonary/Chest: Effort normal. No respiratory distress. She has no wheezes. She has rales. She exhibits no tenderness.  Abdominal: Soft. Bowel sounds are normal. She exhibits no distension. There is no tenderness.  Musculoskeletal: Normal range of motion. She exhibits no tenderness Neurological: She is alert and oriented to person, place, and time.  Skin: Skin is warm and dry. She is not diaphoretic.  Erythematous skin changes and stasis dermatitis in BL legs, R>L with multiple skin-colored nodules PICC line intact with no surrounding erythema   Lab Results: Basic Metabolic Panel:  Recent Labs Lab 10/14/12 0500 10/14/12 1800  NA 139 137  K 3.9 3.9  CL 99 92*  CO2 32 38*  GLUCOSE 123* 197*  BUN 13 14  CREATININE 0.78 0.87  CALCIUM 8.4 8.9  MG 2.0 2.0   Liver Function Tests:  Recent Labs Lab  10/13/12 1419  AST 43*  ALT <5  ALKPHOS 144*  BILITOT 0.7  PROT 7.0  ALBUMIN 2.5*   No results found for this basename: LIPASE, AMYLASE,  in the last 168 hours No results found for this basename: AMMONIA,  in the last 168 hours CBC:  Recent Labs Lab 10/13/12 1419 10/13/12 2123  WBC 5.2 6.2  NEUTROABS 3.3  --   HGB 10.2* 11.1*  HCT 32.4* 34.8*  MCV 91.5 91.1  PLT 138* 171   Cardiac Enzymes:  Recent Labs Lab 10/14/12 0045 10/14/12 0454 10/14/12 1158  TROPONINI <0.30 <0.30 <0.30   BNP:  Recent Labs Lab 10/13/12 1419  PROBNP 694.1*   D-Dimer:  Recent Labs Lab 10/13/12 1419  DDIMER 2.00*   CBG:  Recent Labs Lab 10/13/12 2229 10/14/12 0622 10/14/12 1115 10/14/12 1605  GLUCAP 109* 126* 120* 157*   Hemoglobin A1C:  Recent Labs Lab 10/14/12 0454  HGBA1C 6.7*   Fasting Lipid Panel: No results found for this basename: CHOL, HDL, LDLCALC, TRIG, CHOLHDL, LDLDIRECT,  in the last 168 hours Thyroid Function Tests:  Recent Labs Lab 10/14/12 0454  TSH 1.202   Coagulation: No results found for this basename: LABPROT, INR,  in the last 168 hours Anemia Panel: No results found for this basename: VITAMINB12, FOLATE, FERRITIN, TIBC, IRON, RETICCTPCT,  in the last 168 hours Urine Drug Screen: Drugs of Abuse  Component Value Date/Time   LABOPIA NEGATIVE 01/15/2009 0117   COCAINSCRNUR NEGATIVE 01/15/2009 0117   LABBENZ  Value: POSITIVE (NOTE) Sent for confirmatory testing Result repeated and verified.* 01/15/2009 0117   AMPHETMU NEGATIVE 01/15/2009 0117    Alcohol Level: No results found for this basename: ETH,  in the last 168 hours Urinalysis:  Recent Labs Lab 10/13/12 1541 10/14/12 1326  COLORURINE YELLOW YELLOW  LABSPEC 1.010 1.014  PHURINE 6.0 7.5  GLUCOSEU NEGATIVE NEGATIVE  HGBUR NEGATIVE NEGATIVE  BILIRUBINUR NEGATIVE NEGATIVE  KETONESUR NEGATIVE NEGATIVE  PROTEINUR NEGATIVE NEGATIVE  UROBILINOGEN 0.2 1.0  NITRITE NEGATIVE  NEGATIVE  LEUKOCYTESUR NEGATIVE NEGATIVE   Misc. Labs:   Micro Results: No results found for this or any previous visit (from the past 240 hour(s)). Studies/Results: Dg Chest 2 View  10/13/2012   CLINICAL DATA:  67 year old female with shortness of breath. Cellulitis.  EXAM: CHEST  2 VIEW  COMPARISON:  09/17/2012 and earlier.  FINDINGS: Right PICC line in place, tip at the level of the lower SVC. Small right pleural effusion new or increased since 09/17/2012. Diffuse increased interstitial opacity also progressed. No pneumothorax. No consolidation. Stable cardiomegaly and mediastinal contours. Stable visualized osseous structures.  IMPRESSION: 1.  Small right pleural effusion is new/ increased.  2. Increased interstitial markings, favor pulmonary interstitial edema. Viral/ atypical infection is less likely.  3. Chronic cardiomegaly.  4. Right PICC line in place, tip at the lower SVC level.   Electronically Signed   By: Augusto Gamble   On: 10/13/2012 15:55   Ct Angio Chest Pe W/cm &/or Wo Cm  10/14/2012   *RADIOLOGY REPORT*  Clinical Data: Shortness of breath and cough.  Hypoxia.  CT ANGIOGRAPHY CHEST  Technique:  Multidetector CT imaging of the chest using the standard protocol during bolus administration of intravenous contrast. Multiplanar reconstructed images including MIPs were obtained and reviewed to evaluate the vascular anatomy.  Contrast: OMNIPAQUE IOHEXOL 350 MG/ML SOLN  Comparison: Chest x-ray dated 10/13/2012  Findings: There is cardiomegaly.  There is a small loculated right pleural effusion.  There is pulmonary vascular congestion of the origin of the main pulmonary arteries suggestive of pulmonary arterial hypertension.    No pulmonary emboli.   There are a few scattered lymph nodes in the mediastinum and left hilum, and none of which are pathologically enlarged.  There is a 3.1 cm soft tissue mass extending behind the right sternoclavicular joint which probably represents a thyroid  nodule.  The patient appears to have had previous thyroid surgery on the right and left.  There is slight splenomegaly.  The patient has hepatomegaly with slight nodularity of the liver contour suggesting cirrhosis.  IMPRESSION: 1.  No pulmonary emboli. 2.  Cardiomegaly with right pleural effusion and pulmonary vascular congestion and probable pulmonary arterial hypertension. 3.  Hepatosplenomegaly.  Nodularity of the liver parenchyma consistent with cirrhosis.   Original Report Authenticated By: Francene Boyers, M.D.   Medications: I have reviewed the patient's current medications. Scheduled Meds: . ceFAZolin  2 g Intravenous Q8H  . fluconazole  100 mg Oral Daily  . FLUoxetine  20 mg Oral Daily  . heparin  5,000 Units Subcutaneous Q8H  . hydrocerin  1 application Topical Daily  . levothyroxine  137 mcg Oral QAC breakfast  . metolazone  2.5 mg Oral BID  . metoprolol succinate  12.5 mg Oral Daily  . morphine  60 mg Oral Q12H  . sodium chloride  10-40 mL Intracatheter Q12H  . sodium chloride  3 mL Intravenous Q12H  . spironolactone  25 mg Oral BID   Continuous Infusions: . furosemide (LASIX) infusion 15 mg/hr (10/14/12 1627)   PRN Meds:.sodium chloride, lactulose, ondansetron (ZOFRAN) IV, sodium chloride, sodium chloride Assessment/Plan: Principal Problem:   Acute exacerbation of CHF (congestive heart failure) Active Problems:   Type I (juvenile type) diabetes mellitus with peripheral circulatory disorders, not stated as uncontrolled(250.71)  Assessment & Plan by Problem:  [name] is a 68 y.o. female PMH moderate aortic stenosis, diastolic CHF (EF 16-10%), COPD (on 2L home O2), chronic venous insufficiency, recent admission from 7/25-7/31/14 for RLE cellulitis complicated by MSSA bacteremia, receiving Ancef x 6 weeks through a PICC, who presents with shortness of breath and increased oxygen dependency x1 week.   #Acute on Chronic Multifactorial Diastolic CHF exacerbation - Acute  on chronic diastolic heart failure is the most likely cause of dyspnea in this patient with a 20-30lb weight gain in ~3 weeks, signs of fluid overload on exam including crackles and LE edema, CXR showing pulmonary interstitial edema, and an increased pro-BNP=694. Echo on 09/19/12 showed moderate LVH, hyperdynamic systolic function with EF 70-75%, no wall motion abnormalities, moderate aortic stenosis 2/2 calcified annulus, not candidate of AVR.  - Monitor on telemetry  - Consult HF team - to continue on IV lasix 15mg /hr and2.5mg  BID metolazone, place foley - Monitor BMP, Mg BID   - Continue O2 via [place]. Currently on 4, wean as tolerated. Goal 92-96%.  - Daily weights, strict I/Os  - Cardiologist Dr. Eden Emms    #Elevated d-dimer - Her Wells' score is 3.5, earning points for clinical signs of DVT (asymmetric LE edema, erythema), PE being a likely diagnosis, and immobilization for at least 3 days. This places her in a high risk group with 40.6% chance of PE in an ED population. We will therefore proceed to definitive imaging. Cr 0.70.  - CTA to rule out pulmonary embolism - No PE  -CT findings: Small loculated R pleural effusion, probable PAH, hepatosplenomegaly, nodulary of the liver parenchyma c/w cirrhosis  #Chest tightness - x3weeks, improved after receiving Lasix in the ED. Likely 2/2 dyspnea and pulmonary edema. EKG WNL, but important to rule out coronary ischemia in this patient with significant co-morbidities including obesity, diabetes, heart failure.  - Trending troponins q6h x3  - negative  #RLE cellulitis with MSSA bacteremia - Improving. On 6 weeks of IV abx to cover for endocarditis.  - Continue home Ancef via PICC until ? - Continue home MS Contin bid  - Obtain blood cultures  #COPD on home O2 2L - PFTs 03/01/2012 shows moderate obstructive lung defect (FEV1/FVC 63%, RV 126%) and severely decreased diffusion capacity (DLCO 49%), no response to bronchodilators. Apparently on no home  mediations other than oxygen. Unknown baseline O2 sat.  - Goal O2 sat 92-96%  - Will monitor for signs/symptoms of hypercapnia   #History of hypokalemia - K=3.8 in ED before Lasix. Patient with leg cramps now. Perhaps this issue is why she is not on an ACE-I. Will clarify with Dr. Eden Emms.  - Gave K-dur po  - Follow up repeat BMP  - Follow up magnesium    #DM2 - controlled, on metformin at home   Checking HbA1C - 6.7  - CBG AC and QHS    #History of hospitalization for acute encephalopathy - See EPIC notes from 10/17-10/20/13. LFTs stable. No AMS  currently.  - Continuing home lactulose   #Hypothryoidism - S/p thyroidectomy. Last TSH 0.153  in 11/2011. 3.1cm soft tissue mass behind [place] joint possible thyroid nodule - TSH WNL  - Continue home synthroid  -will need outpatient thyroid US  #Depression - Stable.  - Continue prozac   #Diet - Heart healthy   #DVT PPX - subq Heparin   Dispo: Disposition is deferred at this time, awaiting improvement of current medical problems.  Anticipated discharge in approximately 3 day(s).   The patient does have a current PCP Andrey Campanile Elizebeth Koller, MD) and does need an [facility] hospital follow-up appointment after discharge.  The patient does have transportation limitations that hinder transportation to clinic appointments.  .Services Needed at time of discharge: Y = Yes, Blank = No PT:   OT:   RN:   Equipment:   Other:     LOS: 1 day   Otis Brace, MD 10/14/2012, 8:26 PM

## 2012-10-14 NOTE — Progress Notes (Signed)
Pt sitting up edge of bed. No compliant of SOB or Cp. Pt stat 99% on 4l, moved to 3l . Will continue to monitor

## 2012-10-14 NOTE — Progress Notes (Addendum)
Foley remove. Leaking urine and Pt stated she felt Abdominal cramps, and became very anxious . Will inform MD

## 2012-10-15 ENCOUNTER — Ambulatory Visit: Payer: Medicare Other | Admitting: Nurse Practitioner

## 2012-10-15 DIAGNOSIS — I872 Venous insufficiency (chronic) (peripheral): Secondary | ICD-10-CM

## 2012-10-15 DIAGNOSIS — R609 Edema, unspecified: Secondary | ICD-10-CM

## 2012-10-15 DIAGNOSIS — E876 Hypokalemia: Secondary | ICD-10-CM

## 2012-10-15 LAB — BASIC METABOLIC PANEL
CO2: 44 mEq/L (ref 19–32)
Calcium: 9.4 mg/dL (ref 8.4–10.5)
Calcium: 8.9 mg/dL (ref 8.4–10.5)
Creatinine, Ser: 1.05 mg/dL (ref 0.50–1.10)
GFR calc Af Amer: 74 mL/min — ABNORMAL LOW (ref >90)
GFR calc Af Amer: 63 mL/min — ABNORMAL LOW (ref >90)
GFR calc non Af Amer: 63 mL/min — ABNORMAL LOW (ref >90)
GFR calc non Af Amer: 54 mL/min — ABNORMAL LOW (ref >90)
Potassium: 3.2 mEq/L — ABNORMAL LOW (ref 3.5–5.1)
Sodium: 135 mEq/L (ref 135–145)
Sodium: 132 mEq/L — ABNORMAL LOW (ref 135–145)

## 2012-10-15 LAB — GLUCOSE, CAPILLARY
Glucose-Capillary: 172 mg/dL — ABNORMAL HIGH (ref 70–99)
Glucose-Capillary: 168 mg/dL — ABNORMAL HIGH (ref 70–99)

## 2012-10-15 LAB — MAGNESIUM
Magnesium: 2 mg/dL (ref 1.5–2.5)
Magnesium: 2 mg/dL (ref 1.5–2.5)

## 2012-10-15 MED ORDER — POTASSIUM CHLORIDE CRYS ER 20 MEQ PO TBCR
40.0000 meq | EXTENDED_RELEASE_TABLET | Freq: Once | ORAL | Status: DC
  Filled 2012-10-15: qty 2

## 2012-10-15 MED ORDER — POTASSIUM CHLORIDE CRYS ER 20 MEQ PO TBCR: 40.0000 meq | EXTENDED_RELEASE_TABLET | Freq: Once | ORAL | Status: DC

## 2012-10-15 MED ORDER — POTASSIUM CHLORIDE CRYS ER 20 MEQ PO TBCR
40.0000 meq | EXTENDED_RELEASE_TABLET | Freq: Two times a day (BID) | ORAL | Status: DC
  Administered 2012-10-15 – 2012-10-16 (×3): 40 meq via ORAL
  Filled 2012-10-15 (×4): qty 2

## 2012-10-15 NOTE — Progress Notes (Addendum)
Subjective:  Pt seen and examined in AM. Pt reports SOB is same to slightly improved. Reports dry cough and weakness. Yesterday had muscle cramping. No fever, abdominal pain, nausea, or vomiting. Leg swelling about the same and still with right LE pain.  Has foley in place.   Objective: Vital signs in last 24 hours: Filed Vitals:   10/14/12 0951 10/14/12 1300 10/14/12 2015 10/15/12 0444  BP: 110/74 103/45 126/50 113/39  Pulse: 93 89 86   Temp:  98 F (36.7 C) 98.9 F (37.2 C) 98.6 F (37 C)  TempSrc:  Oral Oral Oral  Resp: 18 18 18 20   Height:      Weight:    117.119 kg (258 lb 3.2 oz)  SpO2: 99% 98% 95% 96%   Weight change: -6.35 kg (-14 lb)  Intake/Output Summary (Last 24 hours) at 10/15/12 0723 Last data filed at 10/15/12 0646  Gross per 24 hour  Intake 1498.83 ml  Output   8500 ml  Net -7001.17 ml   Head: Normocephalic and atraumatic.  Mouth/Throat: Oropharynx is clear and moist.  Eyes: Conjunctivae and EOM are normal. Pupils are equal, round, and reactive to light.  Neck: Normal range of motion. Neck supple. No JVD  Cardiovascular: Normal rate, regular rhythm and intact distal pulses. Systolic III/VI murmur, 2+ pitting edema to the knees  Pulmonary/Chest: Effort normal. No respiratory distress. She has no wheezes. She has rales R>L. She exhibits no tenderness.  Abdominal: Soft. Bowel sounds are normal. She exhibits no distension. There is no tenderness.  Musculoskeletal: Normal range of motion. She exhibits no tenderness Neurological: She is alert and oriented to person, place, and time.  Skin: Skin is warm and dry. She is not diaphoretic. RLE wrapped   PICC line intact with no surrounding erythema   Lab Results: Basic Metabolic Panel:  Recent Labs Lab 10/14/12 1800 10/15/12 0445  NA 137 135  K 3.9 3.2*  CL 92* 87*  CO2 38* 38*  GLUCOSE 197* 171*  BUN 14 16  CREATININE 0.87 0.92  CALCIUM 8.9 9.4  MG 2.0 2.0   Liver Function Tests:  Recent Labs Lab  10/13/12 1419  AST 43*  ALT <5  ALKPHOS 144*  BILITOT 0.7  PROT 7.0  ALBUMIN 2.5*   No results found for this basename: LIPASE, AMYLASE,  in the last 168 hours No results found for this basename: AMMONIA,  in the last 168 hours CBC:  Recent Labs Lab 10/13/12 1419 10/13/12 2123  WBC 5.2 6.2  NEUTROABS 3.3  --   HGB 10.2* 11.1*  HCT 32.4* 34.8*  MCV 91.5 91.1  PLT 138* 171   Cardiac Enzymes:  Recent Labs Lab 10/14/12 0045 10/14/12 0454 10/14/12 1158  TROPONINI <0.30 <0.30 <0.30   BNP:  Recent Labs Lab 10/13/12 1419  PROBNP 694.1*   D-Dimer:  Recent Labs Lab 10/13/12 1419  DDIMER 2.00*   CBG:  Recent Labs Lab 10/13/12 2229 10/14/12 0622 10/14/12 1115 10/14/12 1605 10/14/12 2116 10/15/12 0550  GLUCAP 109* 126* 120* 157* 137* 172*   Hemoglobin A1C:  Recent Labs Lab 10/14/12 0454  HGBA1C 6.7*   Fasting Lipid Panel: No results found for this basename: CHOL, HDL, LDLCALC, TRIG, CHOLHDL, LDLDIRECT,  in the last 168 hours Thyroid Function Tests:  Recent Labs Lab 10/14/12 0454  TSH 1.202   Coagulation: No results found for this basename: LABPROT, INR,  in the last 168 hours Anemia Panel: No results found for this basename: VITAMINB12, FOLATE, FERRITIN, TIBC,  IRON, RETICCTPCT,  in the last 168 hours Urine Drug Screen: Drugs of Abuse     Component Value Date/Time   LABOPIA NEGATIVE 01/15/2009 0117   COCAINSCRNUR NEGATIVE 01/15/2009 0117   LABBENZ  Value: POSITIVE (NOTE) Sent for confirmatory testing Result repeated and verified.* 01/15/2009 0117   AMPHETMU NEGATIVE 01/15/2009 0117    Alcohol Level: No results found for this basename: ETH,  in the last 168 hours Urinalysis:  Recent Labs Lab 10/13/12 1541 10/14/12 1326  COLORURINE YELLOW YELLOW  LABSPEC 1.010 1.014  PHURINE 6.0 7.5  GLUCOSEU NEGATIVE NEGATIVE  HGBUR NEGATIVE NEGATIVE  BILIRUBINUR NEGATIVE NEGATIVE  KETONESUR NEGATIVE NEGATIVE  PROTEINUR NEGATIVE NEGATIVE    UROBILINOGEN 0.2 1.0  NITRITE NEGATIVE NEGATIVE  LEUKOCYTESUR NEGATIVE NEGATIVE   Misc. Labs:   Micro Results: No results found for this or any previous visit (from the past 240 hour(s)). Studies/Results: Dg Chest 2 View  10/13/2012   CLINICAL DATA:  67 year old female with shortness of breath. Cellulitis.  EXAM: CHEST  2 VIEW  COMPARISON:  09/17/2012 and earlier.  FINDINGS: Right PICC line in place, tip at the level of the lower SVC. Small right pleural effusion new or increased since 09/17/2012. Diffuse increased interstitial opacity also progressed. No pneumothorax. No consolidation. Stable cardiomegaly and mediastinal contours. Stable visualized osseous structures.  IMPRESSION: 1.  Small right pleural effusion is new/ increased.  2. Increased interstitial markings, favor pulmonary interstitial edema. Viral/ atypical infection is less likely.  3. Chronic cardiomegaly.  4. Right PICC line in place, tip at the lower SVC level.   Electronically Signed   By: Augusto Gamble   On: 10/13/2012 15:55   Ct Angio Chest Pe W/cm &/or Wo Cm  10/14/2012   *RADIOLOGY REPORT*  Clinical Data: Shortness of breath and cough.  Hypoxia.  CT ANGIOGRAPHY CHEST  Technique:  Multidetector CT imaging of the chest using the standard protocol during bolus administration of intravenous contrast. Multiplanar reconstructed images including MIPs were obtained and reviewed to evaluate the vascular anatomy.  Contrast: OMNIPAQUE IOHEXOL 350 MG/ML SOLN  Comparison: Chest x-ray dated 10/13/2012  Findings: There is cardiomegaly.  There is a small loculated right pleural effusion.  There is pulmonary vascular congestion of the origin of the main pulmonary arteries suggestive of pulmonary arterial hypertension.    No pulmonary emboli.   There are a few scattered lymph nodes in the mediastinum and left hilum, and none of which are pathologically enlarged.  There is a 3.1 cm soft tissue mass extending behind the right sternoclavicular  joint which probably represents a thyroid nodule.  The patient appears to have had previous thyroid surgery on the right and left.  There is slight splenomegaly.  The patient has hepatomegaly with slight nodularity of the liver contour suggesting cirrhosis.  IMPRESSION: 1.  No pulmonary emboli. 2.  Cardiomegaly with right pleural effusion and pulmonary vascular congestion and probable pulmonary arterial hypertension. 3.  Hepatosplenomegaly.  Nodularity of the liver parenchyma consistent with cirrhosis.   Original Report Authenticated By: Francene Boyers, M.D.   Medications: I have reviewed the patient's current medications. Scheduled Meds: . ceFAZolin  2 g Intravenous Q8H  . fluconazole  100 mg Oral Daily  . FLUoxetine  20 mg Oral Daily  . heparin  5,000 Units Subcutaneous Q8H  . hydrocerin  1 application Topical Daily  . levothyroxine  137 mcg Oral QAC breakfast  . metolazone  2.5 mg Oral BID  . metoprolol succinate  12.5 mg Oral Daily  .  morphine  60 mg Oral Q12H  . potassium chloride SA  40 mEq Oral Once  . sodium chloride  10-40 mL Intracatheter Q12H  . sodium chloride  3 mL Intravenous Q12H  . spironolactone  25 mg Oral BID   Continuous Infusions: . furosemide (LASIX) infusion 15 mg/hr (10/15/12 0552)   PRN Meds:.sodium chloride, lactulose, ondansetron (ZOFRAN) IV, sodium chloride, sodium chloride Assessment/Plan: Principal Problem:   Acute exacerbation of CHF (congestive heart failure) Active Problems:   Type I (juvenile type) diabetes mellitus with peripheral circulatory disorders, not stated as uncontrolled(250.71)  Assessment & Plan by Problem:  [name] is a 67 y.o. female PMH moderate aortic stenosis, diastolic CHF (EF 16-10%), COPD (on 2L home O2), chronic venous insufficiency, recent admission from 7/25-7/31/14 for RLE cellulitis complicated by MSSA bacteremia, receiving Ancef x 6 weeks through a PICC, who presents with shortness of breath and increased oxygen  dependency x1 week.   #Acute on Chronic Multifactorial Diastolic CHF exacerbation - Acute on chronic diastolic heart failure is the most likely cause of dyspnea in this patient with a 20-30lb weight gain in ~3 weeks, signs of fluid overload on exam including crackles and LE edema, CXR showing pulmonary interstitial edema, and an increased pro-BNP=694. Echo on 09/19/12 showed moderate LVH, hyperdynamic systolic function with EF 70-75%, no wall motion abnormalities, moderate aortic stenosis 2/2 calcified annulus, not candidate of AVR.  - Monitor on telemetry  - Consult HF team -  IV lasix 15mg /hr to 10mg /hr and 2.5mg  BID metolazone until increase in Cr   Will discuss to see if can d/c foley due to increased risk of infection--per Dr. Gala Romney to leave in for at least another 24hrs due to high volume diuresis.  Will need demadex when discharged - Candidate for AVR or TAVR if loses 60lb - Monitor BMP, Mg BID  - K 3.2, replace K BID , Mg WNL - Continue O2 via [place]. Currently on 4, wean as tolerated. Goal 92-96%.  - Daily weights - 12lb wt loss since yesterday - Sstrict I/Os -  8.5L loss since yesterday - Cardiologist Dr. Eden Emms    #Elevated d-dimer - Her Wells' score is 3.5, earning points for clinical signs of DVT (asymmetric LE edema, erythema), PE being a likely diagnosis, and immobilization for at least 3 days. This places her in a high risk group with 40.6% chance of PE in an ED population. We will therefore proceed to definitive imaging. Cr 0.70.  - CTA to rule out pulmonary embolism - No PE  -CT findings: Small loculated R pleural effusion, probable PAH, hepatosplenomegaly, nodulary of the liver parenchyma c/w cirrhosis  #Chest tightness - x3weeks, improved after receiving Lasix in the ED. Likely 2/2 dyspnea and pulmonary edema. EKG WNL, but important to rule out coronary ischemia in this patient with significant co-morbidities including obesity, diabetes, heart failure.  - Trending troponins q6h  x3  - negative  #RLE cellulitis with MSSA bacteremia - Improving. On 6 weeks of IV abx to cover for endocarditis.  - Continue home Ancef via PICC, started 7/60for 6 weeks, until 9/11 - Continue home MS Contin bid  - Obtain blood cultures -Xray R foot 7/25 - diffuse soft tissue prominence suspicious for cellulitis  -Right LE Duplex US 7/27 - no evidence of DVT, SVT,  or baker's cyst -LE Arterial Duplex - moderate ABI, aortic iliac disease -Wound care consult   #COPD on home O2 2L - PFTs 03/01/2012 shows moderate obstructive lung defect (FEV1/FVC 63%, RV  126%) and severely decreased diffusion capacity (DLCO 49%), no response to bronchodilators. Apparently on no home mediations other than oxygen. Unknown baseline O2 sat.  - Goal O2 sat 92-96%  - Will monitor for signs/symptoms of hypercapnia   #History of hypokalemia - K=3.8 in ED before Lasix. Patient with leg cramps now. Perhaps this issue is why she is not on an ACE-I. Will clarify with Dr. Eden Emms.  -K repletion BID   - BMP, Mg BID     #DM2 - controlled, on metformin at home   Checking HbA1C - 6.7  - CBG AC and QHS   -Nutrition consult   #History of hospitalization for acute encephalopathy - See EPIC notes from 10/17-10/20/13. LFTs stable. No AMS  currently.  - Continuing home lactulose   #Hypothryoidism - S/p thyroidectomy. Last TSH 0.153 in 11/2011. 3.1cm soft tissue mass behind [place] joint possible thyroid nodule - TSH WNL  - Continue home synthroid  -will need outpatient thyroid US  #Depression - Stable.  - Continue prozac   #Diet - Heart healthy   #DVT PPX - subq Heparin   Dispo: Disposition is deferred at this time, awaiting improvement of current medical problems.  Anticipated discharge in approximately 3 day(s).   The patient does have a current PCP Andrey Campanile Elizebeth Koller, MD) and does need an [facility] hospital follow-up appointment after discharge.  The patient does have transportation limitations that hinder  transportation to clinic appointments.  .Services Needed at time of discharge: Y = Yes, Blank = No PT:   OT:   RN:   Equipment:   Other:     LOS: 2 days   Otis Brace, MD 10/15/2012, 7:23 AM

## 2012-10-15 NOTE — Progress Notes (Signed)
  Date: 10/15/2012  Patient name: [name]  Medical record number: [id number]  Date of birth: [birth year]   This patient has been seen and the plan of care was discussed with the house staff. Please see their note for complete details. I concur with their findings with the following additions/corrections:  Admits to muscle cramps recently.  Her leg swelling in unchanged. RLE is dressed with some weeping noted.  SOB is improving. Denies CP. She is diuresing well, -7 L over past day.  Weight is decreasing to 258 from 272 lbs. Currently on Lasix gtt at 10 mg /hr and on metolazone.  Goal is to diurese to a contraction alkalosis and early signs of decreasing eGFR.  Follow K and Mg at least twice a day and replace due to aggressive diuresis and symptoms.  Continue IV abx for MSSA bacteremia due to RLE cellulitis. Wound care consult for RLE.    Jonah Blue, DO, FACP [facility] 10/15/2012, 1:21 PM

## 2012-10-15 NOTE — Progress Notes (Addendum)
Primary Physician: [name], MD Primary Cardiologist: Eden Emms. Also seen 02/2012 by Cooper/Owen in TAVR clinic  Chief Complaint: SOB, a/c LEE,  weight gain 20-30lb Reason for Consult: diastolic CHF, known AS  HF Rounding NOTE    HPI: [name] is a 66 y/o F with morbid obesity, moderate to severe AS, spinal stenosis (uses mostly wheelchair), COPD on home O2, and chronic pain requiring narcotics admitted with recurrent volume overload and resp distress. Getting IV abx due to recent bacteremia.   Started on IV lasix yesterday. Down 7L overnight. Feels better. Breathing better. Weight 272->258. Baseline 230-240    Inpatient Medications:  . ceFAZolin  2 g Intravenous Q8H  . fluconazole  100 mg Oral Daily  . FLUoxetine  20 mg Oral Daily  . heparin  5,000 Units Subcutaneous Q8H  . hydrocerin  1 application Topical Daily  . levothyroxine  137 mcg Oral QAC breakfast  . metolazone  2.5 mg Oral BID  . metoprolol succinate  12.5 mg Oral Daily  . morphine  60 mg Oral Q12H  . sodium chloride  10-40 mL Intracatheter Q12H  . sodium chloride  3 mL Intravenous Q12H  . spironolactone  25 mg Oral BID   . furosemide (LASIX) infusion 15 mg/hr (10/15/12 0552)    Allergies:  Allergies  Allergen Reactions  . Latex     REACTION: itch  . Penicillins     REACTION: hives - tolerates Ancef    Physical Exam: Blood pressure 113/39, pulse 86, temperature 98.6 F (37 C), temperature source Oral, resp. rate 20, height 5' (1.524 m), weight 117.119 kg (258 lb 3.2 oz), SpO2 96.00%. General: Well developed, obese WF in no acute distress. Head: Normocephalic, atraumatic, sclera non-icteric, no xanthomas, nares are without discharge.  Neck: JVD unable to see. Bilateral carotid bruits, question radiation from murmur. Lungs: Diminished BS at bases and coarse otherwise. Breathing is unlabored. Heart: RRR with S1 S2. 2/6 SEM RUSB. Unable to fully auscultate S2 clearly. Abdomen: Massively obese,  non-tender, non-distended with normoactive bowel sounds.  Msk:  Strength and tone appear normal for age. Extremities: No clubbing or cyanosis. Bilateral LE erythematous skin changes and significant LEE R>L with cracking of the skin. RLE is wrapped.  Neuro: Alert and oriented X 3. No facial asymmetry. No focal deficit. Moves all extremities spontaneously. Psych:  Responds to questions appropriately with a pleasant affect.   Labs:  Recent Labs  10/14/12 0045 10/14/12 0454 10/14/12 1158  TROPONINI <0.30 <0.30 <0.30   Lab Results  Component Value Date   WBC 6.2 10/13/2012   HGB 11.1* 10/13/2012   HCT 34.8* 10/13/2012   MCV 91.1 10/13/2012   PLT 171 10/13/2012    Recent Labs Lab 10/13/12 1419  10/15/12 0445  NA 139  < > 135  K 3.8  < > 3.2*  CL 100  < > 87*  CO2 34*  < > 38*  BUN 13  < > 16  CREATININE 0.73  < > 0.92  CALCIUM 8.5  < > 9.4  PROT 7.0  --   --   BILITOT 0.7  --   --   ALKPHOS 144*  --   --   ALT <5  --   --   AST 43*  --   --   GLUCOSE 104*  < > 171*  < > = values in this interval not displayed.  Lab Results  Component Value Date   DDIMER 2.00* 10/13/2012    Radiology/Studies:  Dg  Chest 2 View  10/13/2012   CLINICAL DATA:  67 year old female with shortness of breath. Cellulitis.  EXAM: CHEST  2 VIEW  COMPARISON:  09/17/2012 and earlier.  FINDINGS: Right PICC line in place, tip at the level of the lower SVC. Small right pleural effusion new or increased since 09/17/2012. Diffuse increased interstitial opacity also progressed. No pneumothorax. No consolidation. Stable cardiomegaly and mediastinal contours. Stable visualized osseous structures.  IMPRESSION: 1.  Small right pleural effusion is new/ increased.  2. Increased interstitial markings, favor pulmonary interstitial edema. Viral/ atypical infection is less likely.  3. Chronic cardiomegaly.  4. Right PICC line in place, tip at the lower SVC level.   Electronically Signed   By: Augusto Gamble   On: 10/13/2012 15:55     Ct Angio Chest Pe W/cm &/or Wo Cm 10/14/2012   *RADIOLOGY REPORT*  Clinical Data: Shortness of breath and cough.  Hypoxia.  CT ANGIOGRAPHY CHEST  Technique:  Multidetector CT imaging of the chest using the standard protocol during bolus administration of intravenous contrast. Multiplanar reconstructed images including MIPs were obtained and reviewed to evaluate the vascular anatomy.  Contrast: OMNIPAQUE IOHEXOL 350 MG/ML SOLN  Comparison: Chest x-ray dated 10/13/2012  Findings: There is cardiomegaly.  There is a small loculated right pleural effusion.  There is pulmonary vascular congestion of the origin of the main pulmonary arteries suggestive of pulmonary arterial hypertension.    No pulmonary emboli.   There are a few scattered lymph nodes in the mediastinum and left hilum, and none of which are pathologically enlarged.  There is a 3.1 cm soft tissue mass extending behind the right sternoclavicular joint which probably represents a thyroid nodule.  The patient appears to have had previous thyroid surgery on the right and left.  There is slight splenomegaly.  The patient has hepatomegaly with slight nodularity of the liver contour suggesting cirrhosis.  IMPRESSION: 1.  No pulmonary emboli. 2.  Cardiomegaly with right pleural effusion and pulmonary vascular congestion and probable pulmonary arterial hypertension. 3.  Hepatosplenomegaly.  Nodularity of the liver parenchyma consistent with cirrhosis.   Original Report Authenticated By: Francene Boyers, M.D.   EKG: NSR 88bpm no acute change from prior, PAC, QTc 478   Assessment and Plan:  1. 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 Mod-severe aortic stenosis. EF 70% 3. Morbid obesity 4. Spinal stenosis 5. Chronic pain 6. Severe deconditioning 7. PAH by echo 8. COPD on home O2 9. Recent MSSA bacteremia and R leg cellulits, abx through 10/24/12 via PICC 10. Hypokalemia   Diuresing well on lasix  gtt and metolazone. Will cut lasix drip back down to 10/hr. Would keep diuresing until Cr starts to bump.  If bicarb increasing can consider diamox 500 bid for several days. Will need to switch to demadex on d/c.  Will supp K+  Will not be a candidate for AVR or TAVR in future unless she loses weight and improves her conditioning.  I have told her that I though she would need to weigh 200 pounds or less to make her a reasonable TAVR or AVR candidate. We reviewed her diet and it needs a bunch of work. She is amenable to a Nutrition consult to get her calories down.   Daniel Bensimhon,MD 7:19 AM

## 2012-10-15 NOTE — Telephone Encounter (Signed)
error 

## 2012-10-15 NOTE — Plan of Care (Signed)
Problem: Food- and Nutrition-Related Knowledge Deficit (NB-1.1) Goal: Nutrition education Formal process to instruct or train a patient/client in a skill or to impart knowledge to help patients/clients voluntarily manage or modify food choices and eating behavior to maintain or improve health. Outcome: Completed/Met Date Met:  10/15/12  RD consulted for nutrition education regarding weight loss. Pt confirms that she eats fried foods, sweets, and drinks a lot of Regular sodas.  Body mass index is 50.43 kg/(m^2). Pt meets criteria for Obese Class III based on current BMI.  RD provided "Weight Loss Tips" handout from the [employer]. Emphasized the importance of serving sizes and provided examples of correct portions of common foods. Discussed importance of controlled and consistent intake throughout the day. Provided examples of ways to balance meals/snacks and encouraged intake of high-fiber, whole grain complex carbohydrates. Emphasized the importance of hydration with calorie-free beverages and limiting sugar-sweetened beverages. Encouraged pt to discuss physical activity options with physician. Teach back method used.  Expect poor compliance.  Current diet order is Heart Healthy, patient is consuming approximately 50-100% of meals at this time. Labs and medications reviewed. No further nutrition interventions warranted at this time. RD contact information provided. If additional nutrition issues arise, please re-consult RD.  Jarold Motto MS, RD, LDN Pager: [phone] After-hours pager: [phone]

## 2012-10-15 NOTE — Progress Notes (Addendum)
Pt potassium 3.1 this PM. Pt to receive PO supplement already scheduled for this PM. Pt on lasix drip. MD on call notified. Baron Hamper, RN

## 2012-10-15 NOTE — Progress Notes (Signed)
Physical Therapy Treatment Patient Details Name: [name] MRN: [id number] DOB: 11-08-1945 Today's Date: 10/15/2012 Time: 4098-1191 PT Time Calculation (min): 38 min  PT Assessment / Plan / Recommendation  History of Present Illness 67 yr old female with severe AS, HFpEF (Chronic diastolic heart failure), morbid obesity Body mass index is 52.67 kg/(m^2)., Chronic respiratory failure due to COPD on home O2, chronic pain syndrome, with SOB and weight gain. She has evidence of acute on chronic diastolic heart failure.  She is currently on treatment for MSSA bacteremia and Right LE cellulitis via PICC line.  She has not been a candidate for TAVR.  She feels slightly better today after being started on IV diuretics, as she had a 20-30 lb weight gain recently.   PT Comments   Pt able to increase ambulation distance however continues to need multiple standing rest breaks and increase SOB during ambulation however SaO2 maintained.   Follow Up Recommendations  Home health PT;Supervision/Assistance - 24 hour     Equipment Recommendations  Other (comment) (wide BSC)    Frequency Min 3X/week   Progress towards PT Goals Progress towards PT goals: Progressing toward goals  Plan Current plan remains appropriate    Precautions / Restrictions Precautions Precautions: Fall Restrictions Weight Bearing Restrictions: No   Pertinent Vitals/Pain No c/o pain    Mobility  Bed Mobility Bed Mobility: Not assessed Details for Bed Mobility Assistance:  (Pt standing in room when entering attempting to go to 3n1) Transfers Transfers: Sit to Stand;Stand to Sit Sit to Stand: 4: Min guard;From bed;From chair/3-in-1 Stand to Sit: 4: Min guard;To chair/3-in-1;To bed Details for Transfer Assistance: Multiple sit to stands for toliet hygiene and rest breaks.  Max cues for hand placement.  Pt tends to keep hands on RW with stand > sit Ambulation/Gait Ambulation/Gait Assistance: 4: Min guard Ambulation  Distance (Feet): 125 Feet Assistive device: Rolling walker Ambulation/Gait Assistance Details: Minguard for safety with O2 and multiple lines Gait Pattern: Step-through pattern;Decreased stride length;Trunk flexed Gait velocity: decreased General Gait Details: Intermittent standing rest breaks due to fatigue Stairs: No    Exercises     PT Diagnosis:    PT Problem List:   PT Treatment Interventions:     PT Goals (current goals can now be found in the care plan section) Acute Rehab PT Goals Patient Stated Goal: to go home PT Goal Formulation: With patient Time For Goal Achievement: 10/21/12 Potential to Achieve Goals: Fair  Visit Information  Last PT Received On: 10/15/12 Assistance Needed: +1 History of Present Illness: 67 yr old female with severe AS, HFpEF (Chronic diastolic heart failure), morbid obesity Body mass index is 52.67 kg/(m^2)., Chronic respiratory failure due to COPD on home O2, chronic pain syndrome, with SOB and weight gain. She has evidence of acute on chronic diastolic heart failure.  She is currently on treatment for MSSA bacteremia and Right LE cellulitis via PICC line.  She has not been a candidate for TAVR.  She feels slightly better today after being started on IV diuretics, as she had a 20-30 lb weight gain recently.    Subjective Data  Subjective: "I'm doing ok just still weak." Patient Stated Goal: to go home   Cognition  Cognition Arousal/Alertness: Awake/alert Behavior During Therapy: WFL for tasks assessed/performed Overall Cognitive Status: Within Functional Limits for tasks assessed    Balance     End of Session PT - End of Session Equipment Utilized During Treatment: Oxygen (4L during ambulation) Activity Tolerance: Patient tolerated  treatment well Patient left: in chair;with call bell/phone within reach;with family/visitor present Nurse Communication: Mobility status   GP     Lasya Vetter 10/15/2012, 9:50 AM   Jake Shark, PT  DPT [phone]

## 2012-10-15 NOTE — Progress Notes (Signed)
Pt out of bed with standby assist to [facility] for bowel movement. Pt refuses to use bed alarm and states she will call for help and has not fallen. Pt educated on importance of having RN or nurse tech present to assist her to [facility]. Pt verbalized understanding and states "I will not fall". Baron Hamper, RN

## 2012-10-15 NOTE — Progress Notes (Signed)
CRITICAL VALUE ALERT  Critical value received:  CO2 44  Date of notification:  10/15/2012   Time of notification:  22:44  Critical value read back:yes  Nurse who received alert:  Jobe Igo, RN  MD notified (1st page):  IMTS on call  Time of first page:  22:46  MD notified (2nd page):  Time of second page:  Responding MD:  IMTS on call  Time MD responded:  22:48

## 2012-10-15 NOTE — Consult Note (Signed)
WOC consult Note Reason for Consult: Consult requested for right leg.  Pt has skin changes consistent with end stage lymphadenia and peau d' orange appearance.  Bilat calves with generalized erythremia and edema.  Pt states she bumped her right anterior mid calf prior to admission and it was leaking mod amt yellow drainage.  Currently, no open wound visible.  Small amt yellow drainage from raised irregular skin change area. Applied foam dressing and secured with kerlex to absorb drainage and protect from further injury. Please re-consult if further assistance is needed.  Thank-you,  Cammie Mcgee MSN, RN, CWOCN, [place], CNS [phone]

## 2012-10-15 NOTE — Progress Notes (Signed)
Pt alert, oriented and able to follow commands. Lasix rate changed to 10 ml/hr from 15 ml/hr per order. SO @ bedside. Able to ambulate with 1 assist.

## 2012-10-16 LAB — BASIC METABOLIC PANEL
BUN: 20 mg/dL (ref 6–23)
CO2: 42 mEq/L (ref 19–32)
Calcium: 8.8 mg/dL (ref 8.4–10.5)
Chloride: 85 mEq/L — ABNORMAL LOW (ref 96–112)
Creatinine, Ser: 1.17 mg/dL — ABNORMAL HIGH (ref 0.50–1.10)
GFR calc Af Amer: 58 mL/min — ABNORMAL LOW (ref >90)
GFR calc Af Amer: 55 mL/min — ABNORMAL LOW (ref >90)
Potassium: 2.9 mEq/L — ABNORMAL LOW (ref 3.5–5.1)

## 2012-10-16 LAB — MAGNESIUM: Magnesium: 2 mg/dL (ref 1.5–2.5)

## 2012-10-16 MED ORDER — INSULIN GLARGINE 100 UNIT/ML ~~LOC~~ SOLN
10.0000 [IU] | Freq: Every day | SUBCUTANEOUS | Status: DC
  Administered 2012-10-16 – 2012-10-18 (×3): 10 [IU] via SUBCUTANEOUS
  Filled 2012-10-16 (×3): qty 0.1

## 2012-10-16 MED ORDER — POTASSIUM CHLORIDE CRYS ER 20 MEQ PO TBCR
40.0000 meq | EXTENDED_RELEASE_TABLET | Freq: Once | ORAL | Status: AC
  Administered 2012-10-16: 40 meq via ORAL
  Filled 2012-10-16: qty 2

## 2012-10-16 MED ORDER — POTASSIUM CHLORIDE CRYS ER 20 MEQ PO TBCR
40.0000 meq | EXTENDED_RELEASE_TABLET | Freq: Two times a day (BID) | ORAL | Status: DC
  Administered 2012-10-16 – 2012-10-17 (×2): 40 meq via ORAL
  Filled 2012-10-16 (×4): qty 2

## 2012-10-16 NOTE — Progress Notes (Signed)
SUBJECTIVE:  Had nausea and vomiting overnight.  No abdominal pain or diarrhea OBJECTIVE:   Vitals:   Filed Vitals:   10/15/12 1445 10/15/12 1905 10/15/12 2051 10/16/12 0500  BP: 122/43 149/93 107/53 142/88  Pulse: 79 93 87 96  Temp: 98.5 F (36.9 C) 98.3 F (36.8 C) 97.9 F (36.6 C) 98.3 F (36.8 C)  TempSrc: Oral Oral Oral Oral  Resp: 18 19 20 20   Height:      Weight:    115.123 kg (253 lb 12.8 oz)  SpO2: 96% 94% 97% 93%   I&O's:   Intake/Output Summary (Last 24 hours) at 10/16/12 1119 Last data filed at 10/16/12 1610  Gross per 24 hour  Intake    952 ml  Output   3550 ml  Net  -2598 ml   TELEMETRY: Reviewed telemetry pt in NSR:     PHYSICAL EXAM General: Well developed, well nourished, in no acute distress Head: Eyes PERRLA, No xanthomas.   Normal cephalic and atramatic  Lungs:   Clear bilaterally to auscultation and percussion. Heart:   HRRR S1 S2 Pulses are 2+ & equal. 2/6 SEM RUSB Abdomen: Bowel sounds are positive, abdomen soft and non-tender without masses, obese Extremities:   bilateral LE erythema R>L with chronic venous stasis changes and edema Neuro: Alert and oriented X 3. Psych:  Good affect, responds appropriately   LABS: Basic Metabolic Panel:  Recent Labs  96/04/54 1951 10/16/12 0505  NA 132* 134*  K 3.1* 2.9*  CL 84* 85*  CO2 44* 42*  GLUCOSE 173* 150*  BUN 17 20  CREATININE 1.05 1.12*  CALCIUM 8.9 8.9  MG 2.0 2.0   Liver Function Tests:  Recent Labs  10/13/12 1419  AST 43*  ALT <5  ALKPHOS 144*  BILITOT 0.7  PROT 7.0  ALBUMIN 2.5*   No results found for this basename: LIPASE, AMYLASE,  in the last 72 hours CBC:  Recent Labs  10/13/12 1419 10/13/12 2123  WBC 5.2 6.2  NEUTROABS 3.3  --   HGB 10.2* 11.1*  HCT 32.4* 34.8*  MCV 91.5 91.1  PLT 138* 171   Cardiac Enzymes:  Recent Labs  10/14/12 0045 10/14/12 0454 10/14/12 1158  TROPONINI <0.30 <0.30 <0.30   BNP: No components found with this basename: POCBNP,   D-Dimer:  Recent Labs  10/13/12 1419  DDIMER 2.00*   Hemoglobin A1C:  Recent Labs  10/14/12 0454  HGBA1C 6.7*   Fasting Lipid Panel: No results found for this basename: CHOL, HDL, LDLCALC, TRIG, CHOLHDL, LDLDIRECT,  in the last 72 hours Thyroid Function Tests:  Recent Labs  10/14/12 0454  TSH 1.202   Anemia Panel: No results found for this basename: VITAMINB12, FOLATE, FERRITIN, TIBC, IRON, RETICCTPCT,  in the last 72 hours Coag Panel:   Lab Results  Component Value Date   INR 1.08 10/25/2011   INR 1.09 07/31/2010   INR 1.05 12/13/2009    RADIOLOGY: Dg Chest 2 View  10/13/2012   CLINICAL DATA:  67 year old female with shortness of breath. Cellulitis.  EXAM: CHEST  2 VIEW  COMPARISON:  09/17/2012 and earlier.  FINDINGS: Right PICC line in place, tip at the level of the lower SVC. Small right pleural effusion new or increased since 09/17/2012. Diffuse increased interstitial opacity also progressed. No pneumothorax. No consolidation. Stable cardiomegaly and mediastinal contours. Stable visualized osseous structures.  IMPRESSION: 1.  Small right pleural effusion is new/ increased.  2. Increased interstitial markings, favor pulmonary interstitial edema. Viral/ atypical infection  is less likely.  3. Chronic cardiomegaly.  4. Right PICC line in place, tip at the lower SVC level.   Electronically Signed   By: Augusto Gamble   On: 10/13/2012 15:55   Dg Tibia/fibula Right  09/17/2012   *RADIOLOGY REPORT*  Clinical Data: Lower leg pain, swelling and erythema extending into the foot for 3 weeks.  RIGHT TIBIA AND FIBULA - 2 VIEW  Comparison: Right knee MRI 10/31/2007.  Findings: The mineralization and alignment are normal.  There is no evidence of acute fracture, dislocation or bone destruction.  There is mildly progressive medial joint space loss at the knee.  The soft tissues of the lower leg are diffusely prominent with suspected generalized subcutaneous edema.  There is no evidence of foreign  body or soft tissue emphysema.  IMPRESSION: Suspected diffuse lower leg soft tissue edema suggestive of cellulitis in this clinical context.  No soft tissue emphysema, foreign body or osseous abnormality identified.   Original Report Authenticated By: Carey Bullocks, M.D.   Ct Angio Chest Pe W/cm &/or Wo Cm  10/14/2012   *RADIOLOGY REPORT*  Clinical Data: Shortness of breath and cough.  Hypoxia.  CT ANGIOGRAPHY CHEST  Technique:  Multidetector CT imaging of the chest using the standard protocol during bolus administration of intravenous contrast. Multiplanar reconstructed images including MIPs were obtained and reviewed to evaluate the vascular anatomy.  Contrast: OMNIPAQUE IOHEXOL 350 MG/ML SOLN  Comparison: Chest x-ray dated 10/13/2012  Findings: There is cardiomegaly.  There is a small loculated right pleural effusion.  There is pulmonary vascular congestion of the origin of the main pulmonary arteries suggestive of pulmonary arterial hypertension.    No pulmonary emboli.   There are a few scattered lymph nodes in the mediastinum and left hilum, and none of which are pathologically enlarged.  There is a 3.1 cm soft tissue mass extending behind the right sternoclavicular joint which probably represents a thyroid nodule.  The patient appears to have had previous thyroid surgery on the right and left.  There is slight splenomegaly.  The patient has hepatomegaly with slight nodularity of the liver contour suggesting cirrhosis.  IMPRESSION: 1.  No pulmonary emboli. 2.  Cardiomegaly with right pleural effusion and pulmonary vascular congestion and probable pulmonary arterial hypertension. 3.  Hepatosplenomegaly.  Nodularity of the liver parenchyma consistent with cirrhosis.   Original Report Authenticated By: Francene Boyers, M.D.   Dg Chest Port 1 View  09/17/2012   *RADIOLOGY REPORT*  Clinical Data: Cough and congestion.  PORTABLE CHEST - 1 VIEW  Comparison: 12/12/2011  Findings: Semi upright view of the  chest demonstrates low lung volumes. There are prominent interstitial lung markings.  Heart size is upper limits of normal.  Trachea is midline.  Previous right shoulder surgery.  IMPRESSION: Low lung volumes.  Low lung volumes may be accentuating the size of the heart and interstitial lung markings.  Difficult to exclude mild edema.   Original Report Authenticated By: Richarda Overlie, M.D.   Dg Foot Complete Right  09/17/2012   *RADIOLOGY REPORT*  Clinical Data: Lower leg pain, swelling and erythema extending into the foot for 3 weeks.  RIGHT FOOT COMPLETE - 3+ VIEW  Comparison: None.  Findings: Within the foot, the bones appear mildly demineralized. There is no evidence of acute fracture, dislocation or bone destruction.  The soft tissues appear diffusely prominent, especially in the dorsum of the forefoot.  There is no evidence of foreign body or soft tissue emphysema.  IMPRESSION: Diffuse soft tissue  prominence suspicious for cellulitis in this clinical context.  No evidence of foreign body or osteomyelitis.   Original Report Authenticated By: Carey Bullocks, M.D.   Assessment and Plan:  1. Acute on chronic multifactorial diastolic CHF / acute on chronic respiratory failure  2. Mod-severe aortic stenosis. EF 70%  3. Morbid obesity  4. Spinal stenosis  5. Chronic pain  6. Severe deconditioning  7. PAH by echo  8. COPD on home O2  9. Recent MSSA bacteremia and R leg cellulits, abx through 10/24/12 via PICC  10. Hypokalemia 11.  N/V of unknown etiology  - replete potassium - follow renal function closely - continue IV Lasix gtt and metolazone - if vomiting continues may need to back off diuretics - bicarb improved today but will continue to follow and add Diamox if needed  Quintella Reichert, MD  10/16/2012  11:19 AM

## 2012-10-16 NOTE — Progress Notes (Signed)
Physical Therapy Treatment Patient Details Name: [name] MRN: [id number] DOB: April 05, 1945 Today's Date: 10/16/2012 Time: 4098-1191 PT Time Calculation (min): 28 min  PT Assessment / Plan / Recommendation  History of Present Illness 67 yr old female with severe AS, HFpEF (Chronic diastolic heart failure), morbid obesity Body mass index is 52.67 kg/(m^2)., Chronic respiratory failure due to COPD on home O2, chronic pain syndrome, with SOB and weight gain. She has evidence of acute on chronic diastolic heart failure.  She is currently on treatment for MSSA bacteremia and Right LE cellulitis via PICC line.  She has not been a candidate for TAVR.  She feels slightly better today after being started on IV diuretics, as she had a 20-30 lb weight gain recently.   PT Comments   Pt able to increase ambulation distance and plans to use ramp for stair negotiation with w/c.  Pt reported N/V last night and not feeling as well today.  RN & MD aware.  Will continue to follow.   Follow Up Recommendations  Home health PT;Supervision/Assistance - 24 hour     Equipment Recommendations  Other (comment) (wide 3n1)    Frequency Min 3X/week   Progress towards PT Goals Progress towards PT goals: Progressing toward goals  Plan Current plan remains appropriate    Precautions / Restrictions Precautions Precautions: Fall Restrictions Weight Bearing Restrictions: No   Pertinent Vitals/Pain No c/o pain; SaO2 > 92% on 4L with ambulation    Mobility  Bed Mobility Bed Mobility: Not assessed Supine to Sit:  (Pt sitting EOB when entering) Transfers Transfers: Sit to Stand;Stand to Sit Sit to Stand: 4: Min guard;From bed;From chair/3-in-1 Stand to Sit: 4: Min guard;To chair/3-in-1;To bed Details for Transfer Assistance: Minguard for safety with multiple sit stands for [employer].  Max cues for RW.  Pt continues to put RW to side prior to sitting.  Ambulation/Gait Ambulation/Gait Assistance: 4: Min  guard Ambulation Distance (Feet): 150 Feet Assistive device: Rolling walker Ambulation/Gait Assistance Details: Cues for RW placement and proper body position within RW. Gait Pattern: Step-through pattern;Decreased stride length;Trunk flexed Gait velocity: decreased General Gait Details: Intermittent standing rest breaks due to fatigue Stairs: No (husband has ramp set up for home) Wheelchair Mobility Wheelchair Mobility: No    Exercises     PT Diagnosis:    PT Problem List:   PT Treatment Interventions:     PT Goals (current goals can now be found in the care plan section) Acute Rehab PT Goals Patient Stated Goal: to go home PT Goal Formulation: With patient Time For Goal Achievement: 10/21/12 Potential to Achieve Goals: Fair  Visit Information  Last PT Received On: 10/16/12 Assistance Needed: +1 History of Present Illness: 67 yr old female with severe AS, HFpEF (Chronic diastolic heart failure), morbid obesity Body mass index is 52.67 kg/(m^2)., Chronic respiratory failure due to COPD on home O2, chronic pain syndrome, with SOB and weight gain. She has evidence of acute on chronic diastolic heart failure.  She is currently on treatment for MSSA bacteremia and Right LE cellulitis via PICC line.  She has not been a candidate for TAVR.  She feels slightly better today after being started on IV diuretics, as she had a 20-30 lb weight gain recently.    Subjective Data  Subjective: "My husband has it fixed where I just take the wheelchair into the house.  So  I don't have to go up steps." Patient Stated Goal: to go home   Cognition  Cognition  Arousal/Alertness: Awake/alert Behavior During Therapy: WFL for tasks assessed/performed Overall Cognitive Status: Within Functional Limits for tasks assessed    Balance     End of Session PT - End of Session Equipment Utilized During Treatment: Gait belt;Oxygen (4L) Activity Tolerance: Patient tolerated treatment well Patient left: in  bed;with call bell/phone within reach (sitting EOB) Nurse Communication: Mobility status   GP     Major Santerre 10/16/2012, 4:43 PM Jake Shark, PT DPT [phone]

## 2012-10-16 NOTE — Progress Notes (Signed)
CO2 42 this AM. MD on call paged and acknowledged. CO2 44 last night. Baron Hamper, RN

## 2012-10-16 NOTE — Progress Notes (Signed)
Pt a/o, pt had c/o nausea in the am had ginger ale nausea subsided, pt still on lasix gtt at 10cc/hr, diuresing well, pt had orthostatics done, MD aware, K was repleated, pt ate lunch no c/o nausea, will continue to monitor

## 2012-10-16 NOTE — Progress Notes (Signed)
Subjective: Patient states that she felt dizziness, nausea last night when resting on bed. She reports 3 episodes of vomiting stomach content, which was relived by Zofran. Denies vertigo, tinnitus, hearing loss, chest pain, chest pressure, abdominal pain or diarrhea. She states that she does not have any dizziness, nausea or vomiting and is eating lunch now.    Objective: Vital signs in last 24 hours: Filed Vitals:   10/16/12 1411 10/16/12 1414 10/16/12 1416 10/16/12 1419  BP: 100/41 112/45 65/46 130/102  Pulse: 94 93 98 102  Temp: 98.7 F (37.1 C)     TempSrc: Oral     Resp: 19     Height:      Weight:      SpO2: 92%      Weight change: -4 lb 6.4 oz (-1.996 kg)  Intake/Output Summary (Last 24 hours) at 10/16/12 1427 Last data filed at 10/16/12 1326  Gross per 24 hour  Intake    842 ml  Output   4000 ml  Net  -3158 ml   General: NAD Neck: soft. Unable to assess JVD due to body habitus. Lungs: CTA B/L Heart: RRR, Grade III systolic crescendo-decrescendo murmur heard best at right 2nd intercostal space, radiating to the carotids. Abd: central obesity, soft. BSx4 LE: Erythematous skin changes and stasis dermatitis in BL legs, R>L. Right leg is more swollen than left, also has multiple skin-colored nodules and 1 clear vesicle on the anterior portion that pt says developed after she hit her leg against her wheelchair a few days ago. Dressing dry and intact.   Lab Results: Basic Metabolic Panel:  Recent Labs Lab 10/15/12 1951 10/16/12 0505  Mee Macdonnell 132* 134*  K 3.1* 2.9*  CL 84* 85*  CO2 44* 42*  GLUCOSE 173* 150*  BUN 17 20  CREATININE 1.05 1.12*  CALCIUM 8.9 8.9  MG 2.0 2.0   Liver Function Tests:  Recent Labs Lab 10/13/12 1419  AST 43*  ALT <5  ALKPHOS 144*  BILITOT 0.7  PROT 7.0  ALBUMIN 2.5*   CBC:  Recent Labs Lab 10/13/12 1419 10/13/12 2123  WBC 5.2 6.2  NEUTROABS 3.3  --   HGB 10.2* 11.1*  HCT 32.4* 34.8*  MCV 91.5 91.1  PLT 138* 171    Cardiac Enzymes:  Recent Labs Lab 10/14/12 0045 10/14/12 0454 10/14/12 1158  TROPONINI <0.30 <0.30 <0.30   BNP:  Recent Labs Lab 10/13/12 1419  PROBNP 694.1*   D-Dimer:  Recent Labs Lab 10/13/12 1419  DDIMER 2.00*   CBG:  Recent Labs Lab 10/15/12 0550 10/15/12 1116 10/15/12 1606 10/15/12 2139 10/16/12 0720 10/16/12 1121  GLUCAP 172* 134* 168* 156* 136* 145*   Hemoglobin A1C:  Recent Labs Lab 10/14/12 0454  HGBA1C 6.7*   Thyroid Function Tests:  Recent Labs Lab 10/14/12 0454  TSH 1.202   Urine Drug Screen: Drugs of Abuse     Component Value Date/Time   LABOPIA NEGATIVE 01/15/2009 0117   COCAINSCRNUR NEGATIVE 01/15/2009 0117   LABBENZ  Value: POSITIVE (NOTE) Sent for confirmatory testing Result repeated and verified.* 01/15/2009 0117   AMPHETMU NEGATIVE 01/15/2009 0117    Urinalysis:  Recent Labs Lab 10/13/12 1541 10/14/12 1326  COLORURINE YELLOW YELLOW  LABSPEC 1.010 1.014  PHURINE 6.0 7.5  GLUCOSEU NEGATIVE NEGATIVE  HGBUR NEGATIVE NEGATIVE  BILIRUBINUR NEGATIVE NEGATIVE  KETONESUR NEGATIVE NEGATIVE  PROTEINUR NEGATIVE NEGATIVE  UROBILINOGEN 0.2 1.0  NITRITE NEGATIVE NEGATIVE  LEUKOCYTESUR NEGATIVE NEGATIVE    Micro Results: Recent Results (from the  past 240 hour(s))  CULTURE, BLOOD (ROUTINE X 2)     Status: None   Collection Time    10/14/12 11:58 AM      Result Value Range Status   Specimen Description BLOOD LEFT ARM   Final   Special Requests BOTTLES DRAWN AEROBIC AND ANAEROBIC 10CC   Final   Culture  Setup Time     Final   Value: 10/14/2012 16:39     Performed at [facility]   Culture     Final   Value:        BLOOD CULTURE RECEIVED NO GROWTH TO DATE CULTURE WILL BE HELD FOR 5 DAYS BEFORE ISSUING A FINAL NEGATIVE REPORT     Performed at [facility]   Report Status PENDING   Incomplete  CULTURE, BLOOD (ROUTINE X 2)     Status: None   Collection Time    10/14/12 12:05 PM      Result Value Range  Status   Specimen Description BLOOD LEFT HAND   Final   Special Requests BOTTLES DRAWN AEROBIC AND ANAEROBIC 10CC   Final   Culture  Setup Time     Final   Value: 10/14/2012 16:39     Performed at [facility]   Culture     Final   Value:        BLOOD CULTURE RECEIVED NO GROWTH TO DATE CULTURE WILL BE HELD FOR 5 DAYS BEFORE ISSUING A FINAL NEGATIVE REPORT     Performed at [facility]   Report Status PENDING   Incomplete   Studies/Results: No results found. Medications: I have reviewed the patient's current medications. Scheduled Meds: . ceFAZolin  2 g Intravenous Q8H  . fluconazole  100 mg Oral Daily  . FLUoxetine  20 mg Oral Daily  . heparin  5,000 Units Subcutaneous Q8H  . hydrocerin  1 application Topical Daily  . insulin glargine  10 Units Subcutaneous QHS  . levothyroxine  137 mcg Oral QAC breakfast  . metolazone  2.5 mg Oral BID  . metoprolol succinate  12.5 mg Oral Daily  . morphine  60 mg Oral Q12H  . potassium chloride SA  40 mEq Oral BID  . sodium chloride  10-40 mL Intracatheter Q12H  . sodium chloride  3 mL Intravenous Q12H  . spironolactone  25 mg Oral BID   Continuous Infusions: . furosemide (LASIX) infusion 10 mg/hr (10/15/12 0746)   PRN Meds:.sodium chloride, lactulose, ondansetron (ZOFRAN) IV, sodium chloride, sodium chloride Assessment/Plan:  Mrs. MYKEISHA DYSERT is a 67 y.o. female PMH moderate aortic stenosis, diastolic CHF (EF 16-10%), COPD (on 2L home O2), chronic venous insufficiency, recent admission from 7/25-7/31/14 for RLE cellulitis complicated by MSSA bacteremia, receiving Ancef x 6 weeks through a PICC, who presents with [facility] CHF. HF team is consulted.   # Orthostatic hypotension.    Patient reports nausea, vomiting and dizziness last night. Her orthostatic is positive. Her systolic BP dropped 35 points from lying to standing while her HR was increased from 94 to 98 ( on betablocker). 1411 1414 1416 1419  lying sitting standing  standing  100/41 112/45 65/46 130/102  94 93 98 102    - Orthostatic VS daily - unable to apply TED due to significant LE swelling and erythemas. -  Will inform Cardiology>>>defer the management of BP lowering meds to Cardiology in the setting of [facility] CHF and AS.    #AC CHF exacerbation and mod-severe AS Echo on 09/19/12  showed moderate LVH, hyperdynamic systolic function with EF 70-75%, no wall motion abnormalities, moderate aortic stenosis 2/2 calcified annulus, not candidate of AVR.   Net neg: 11,000 ml and weight down 19 lbs since admission  - Appreciate HF team input!! - Oxygen - On lasix drip, Metolazone, Metoprolol and spirolactone per cardiology - Close monitoring of BMP and Mg  #Elevated d-dimer -  - CTA to rule out pulmonary embolism - No PE   #Chest tightness - troponins q6h x3 - negative   #RLE cellulitis with MSSA bacteremia - Improving. On 6 weeks of IV abx to cover for endocarditis.  - Continue home Ancef via PICC, started 7/31for 6 weeks, until 9/11  - Continue home MS Contin bid  - Obtain blood cultures  - Xray R foot 7/25 - diffuse soft tissue prominence suspicious for cellulitis  -Right LE Duplex US 7/27 - no evidence of DVT, SVT, or baker's cyst  -LE Arterial Duplex - moderate ABI, aortic iliac disease  -Wound care consult   #COPD on home O2 2L - stable  #Hypokalemia -on lasix drip and spirolactone. n K dur 40 po BID  - K 2.9 today. Mg 2.0>> Will give additional K dur 40 x 1 dose   - BMP, Mg BID   #DM2 - controlled, on metformin at home  - basal requirement is 28 units based on weight and GFR - will start Lantus 10 units QHS since she is insulin naive. - CBG AC and QHS   #History of hospitalization for acute encephalopathy -stable  #Hypothryoidism - S/p thyroidectomy. Last TSH 0.153 in 11/2011. 3.1cm soft tissue mass behind [place] joint possible thyroid nodule  - TSH WNL  - Continue home synthroid   #Depression - Stable.  - Continue prozac   #Diet -  Heart healthy   #DVT PPX - subq Heparin      Dispo: Disposition is deferred at this time, awaiting improvement of current medical problems.  Anticipated discharge in approximately 4-5 day(s).   The patient does have a current PCP Andrey Campanile Elizebeth Koller, MD) and does not need an [facility] hospital follow-up appointment after discharge.  The patient does not have transportation limitations that hinder transportation to clinic appointments.  .Services Needed at time of discharge: Y = Yes, Blank = No PT:   OT:   RN:   Equipment:   Other:     LOS: 3 days   Dede Query, MD 10/16/2012, 2:27 PM

## 2012-10-17 LAB — BASIC METABOLIC PANEL
BUN: 25 mg/dL — ABNORMAL HIGH (ref 6–23)
CO2: >45 mEq/L (ref 19–32)
CO2: 41 mEq/L (ref 19–32)
GFR calc non Af Amer: 51 mL/min — ABNORMAL LOW (ref >90)
GFR calc non Af Amer: 48 mL/min — ABNORMAL LOW (ref >90)
Glucose, Bld: 155 mg/dL — ABNORMAL HIGH (ref 70–99)
Glucose, Bld: 263 mg/dL — ABNORMAL HIGH (ref 70–99)
Potassium: 3.3 mEq/L — ABNORMAL LOW (ref 3.5–5.1)
Potassium: 3.3 mEq/L — ABNORMAL LOW (ref 3.5–5.1)
Sodium: 132 mEq/L — ABNORMAL LOW (ref 135–145)

## 2012-10-17 LAB — GLUCOSE, CAPILLARY
Glucose-Capillary: 163 mg/dL — ABNORMAL HIGH (ref 70–99)
Glucose-Capillary: 180 mg/dL — ABNORMAL HIGH (ref 70–99)

## 2012-10-17 LAB — MAGNESIUM: Magnesium: 2 mg/dL (ref 1.5–2.5)

## 2012-10-17 MED ORDER — ACETAZOLAMIDE ER 500 MG PO CP12
500.0000 mg | ORAL_CAPSULE | Freq: Two times a day (BID) | ORAL | Status: DC
  Administered 2012-10-17 – 2012-10-19 (×5): 500 mg via ORAL
  Filled 2012-10-17 (×8): qty 1

## 2012-10-17 MED ORDER — POTASSIUM CHLORIDE CRYS ER 20 MEQ PO TBCR
40.0000 meq | EXTENDED_RELEASE_TABLET | Freq: Once | ORAL | Status: AC
  Administered 2012-10-17: 40 meq via ORAL

## 2012-10-17 MED ORDER — METOLAZONE 2.5 MG PO TABS
2.5000 mg | ORAL_TABLET | Freq: Two times a day (BID) | ORAL | Status: DC
  Administered 2012-10-17 – 2012-10-18 (×4): 2.5 mg via ORAL
  Filled 2012-10-17 (×7): qty 1

## 2012-10-17 MED ORDER — POTASSIUM CHLORIDE CRYS ER 20 MEQ PO TBCR
40.0000 meq | EXTENDED_RELEASE_TABLET | Freq: Once | ORAL | Status: AC
  Administered 2012-10-17: 40 meq via ORAL
  Filled 2012-10-17: qty 2

## 2012-10-17 NOTE — Progress Notes (Signed)
Dr Mayford Knife in room assessing patient. Dr. Mayford Knife made aware of patients serum CO2 level. Dr Mayford Knife to put in orders regarding levels

## 2012-10-17 NOTE — Progress Notes (Signed)
Patient resting quietly with no complaints. 

## 2012-10-17 NOTE — Progress Notes (Signed)
Dr. Fran Lowes made aware of patients serum CO2 level of 45. Will let Dr. Mayford Knife know when arrives to flood

## 2012-10-17 NOTE — Progress Notes (Signed)
Subjective:  Pt seen and examined in AM. Pt with improved nausea and vomiting. Still with dry cough, dyspnea, and lightheadedness on standing. This morning she had headache after her nasal canculi  fell out. No CP, palpitations, or  abdominal pain. Had BM yesterday after lactulose given.   Objective: Vital signs in last 24 hours: Filed Vitals:   10/16/12 1416 10/16/12 1419 10/16/12 2055 10/17/12 0558  BP: 65/46 130/102 115/55 137/45  Pulse: 98 102 94 92  Temp:   97.6 F (36.4 C) 98.5 F (36.9 C)  TempSrc:   Oral Oral  Resp:   18 18  Height:      Weight:    114.034 kg (251 lb 6.4 oz)  SpO2:   98% 93%   Weight change: -1.089 kg (-2 lb 6.4 oz)  Intake/Output Summary (Last 24 hours) at 10/17/12 7829 Last data filed at 10/17/12 0100  Gross per 24 hour  Intake    720 ml  Output    648 ml  Net     72 ml   Head: Normocephalic and atraumatic.  Mouth/Throat: Oropharynx is clear and moist.  Eyes: Conjunctivae and EOM are normal. Pupils are equal, round, and reactive to light.  Neck: Normal range of motion. Neck supple. No JVD  Cardiovascular: Normal rate, regular rhythm and intact distal pulses. Systolic III/VI murmur, 2+ pitting edema to the knees  Pulmonary/Chest: Effort normal. No respiratory distress. She has no wheezes. She has rales R>L. She exhibits no tenderness.  Abdominal: Soft. Bowel sounds are normal. She exhibits no distension. There is no tenderness.  Musculoskeletal: Normal range of motion. She exhibits no tenderness Neurological: She is alert and oriented to person, place, and time.  Skin: Skin is warm and dry. She is not diaphoretic. RLE wrapped   PICC line intact with no surrounding erythema   Lab Results: Basic Metabolic Panel:  Recent Labs Lab 10/16/12 1800 10/17/12 0510  NA 132* 132*  K 3.1* 3.3*  CL 84* 83*  CO2 43* >45*  GLUCOSE 175* 155*  BUN 23 24*  CREATININE 1.17* 1.10  CALCIUM 8.8 8.9  MG 2.0 2.0   Liver Function Tests:  Recent  Labs Lab 10/13/12 1419  AST 43*  ALT <5  ALKPHOS 144*  BILITOT 0.7  PROT 7.0  ALBUMIN 2.5*   No results found for this basename: LIPASE, AMYLASE,  in the last 168 hours No results found for this basename: AMMONIA,  in the last 168 hours CBC:  Recent Labs Lab 10/13/12 1419 10/13/12 2123  WBC 5.2 6.2  NEUTROABS 3.3  --   HGB 10.2* 11.1*  HCT 32.4* 34.8*  MCV 91.5 91.1  PLT 138* 171   Cardiac Enzymes:  Recent Labs Lab 10/14/12 0045 10/14/12 0454 10/14/12 1158  TROPONINI <0.30 <0.30 <0.30   BNP:  Recent Labs Lab 10/13/12 1419  PROBNP 694.1*   D-Dimer:  Recent Labs Lab 10/13/12 1419  DDIMER 2.00*   CBG:  Recent Labs Lab 10/16/12 0720 10/16/12 1121 10/16/12 1611 10/16/12 2052 10/17/12 0313 10/17/12 0654  GLUCAP 136* 145* 138* 163* 180* 139*   Hemoglobin A1C:  Recent Labs Lab 10/14/12 0454  HGBA1C 6.7*   Fasting Lipid Panel: No results found for this basename: CHOL, HDL, LDLCALC, TRIG, CHOLHDL, LDLDIRECT,  in the last 168 hours Thyroid Function Tests:  Recent Labs Lab 10/14/12 0454  TSH 1.202   Coagulation: No results found for this basename: LABPROT, INR,  in the last 168 hours Anemia Panel: No results  found for this basename: VITAMINB12, FOLATE, FERRITIN, TIBC, IRON, RETICCTPCT,  in the last 168 hours Urine Drug Screen: Drugs of Abuse     Component Value Date/Time   LABOPIA NEGATIVE 01/15/2009 0117   COCAINSCRNUR NEGATIVE 01/15/2009 0117   LABBENZ  Value: POSITIVE (NOTE) Sent for confirmatory testing Result repeated and verified.* 01/15/2009 0117   AMPHETMU NEGATIVE 01/15/2009 0117    Alcohol Level: No results found for this basename: ETH,  in the last 168 hours Urinalysis:  Recent Labs Lab 10/13/12 1541 10/14/12 1326  COLORURINE YELLOW YELLOW  LABSPEC 1.010 1.014  PHURINE 6.0 7.5  GLUCOSEU NEGATIVE NEGATIVE  HGBUR NEGATIVE NEGATIVE  BILIRUBINUR NEGATIVE NEGATIVE  KETONESUR NEGATIVE NEGATIVE  PROTEINUR NEGATIVE  NEGATIVE  UROBILINOGEN 0.2 1.0  NITRITE NEGATIVE NEGATIVE  LEUKOCYTESUR NEGATIVE NEGATIVE   Misc. Labs:   Micro Results: Recent Results (from the past 240 hour(s))  CULTURE, BLOOD (ROUTINE X 2)     Status: None   Collection Time    10/14/12 11:58 AM      Result Value Range Status   Specimen Description BLOOD LEFT ARM   Final   Special Requests BOTTLES DRAWN AEROBIC AND ANAEROBIC 10CC   Final   Culture  Setup Time     Final   Value: 10/14/2012 16:39     Performed at [facility]   Culture     Final   Value:        BLOOD CULTURE RECEIVED NO GROWTH TO DATE CULTURE WILL BE HELD FOR 5 DAYS BEFORE ISSUING A FINAL NEGATIVE REPORT     Performed at [facility]   Report Status PENDING   Incomplete  CULTURE, BLOOD (ROUTINE X 2)     Status: None   Collection Time    10/14/12 12:05 PM      Result Value Range Status   Specimen Description BLOOD LEFT HAND   Final   Special Requests BOTTLES DRAWN AEROBIC AND ANAEROBIC 10CC   Final   Culture  Setup Time     Final   Value: 10/14/2012 16:39     Performed at [facility]   Culture     Final   Value:        BLOOD CULTURE RECEIVED NO GROWTH TO DATE CULTURE WILL BE HELD FOR 5 DAYS BEFORE ISSUING A FINAL NEGATIVE REPORT     Performed at [facility]   Report Status PENDING   Incomplete   Studies/Results: No results found. Medications: I have reviewed the patient's current medications. Scheduled Meds: . ceFAZolin  2 g Intravenous Q8H  . fluconazole  100 mg Oral Daily  . FLUoxetine  20 mg Oral Daily  . heparin  5,000 Units Subcutaneous Q8H  . hydrocerin  1 application Topical Daily  . insulin glargine  10 Units Subcutaneous QHS  . levothyroxine  137 mcg Oral QAC breakfast  . metolazone  2.5 mg Oral BID  . morphine  60 mg Oral Q12H  . potassium chloride SA  40 mEq Oral BID  . potassium chloride  40 mEq Oral Once  . sodium chloride  10-40 mL Intracatheter Q12H  . sodium chloride  3 mL Intravenous Q12H  .  spironolactone  25 mg Oral BID   Continuous Infusions: . furosemide (LASIX) infusion 10 mg/hr (10/15/12 0746)   PRN Meds:.sodium chloride, lactulose, ondansetron (ZOFRAN) IV, sodium chloride, sodium chloride Assessment/Plan: Principal Problem:   Acute exacerbation of CHF (congestive heart failure) Active Problems:   Type I (juvenile type) diabetes  mellitus with peripheral circulatory disorders, not stated as uncontrolled(250.71)  Assessment & Plan by Problem:  [name] is a 67 y.o. female PMH moderate aortic stenosis, diastolic CHF (EF 40-98%), COPD (on 2L home O2), chronic venous insufficiency, recent admission from 7/25-7/31/14 for RLE cellulitis complicated by MSSA bacteremia, receiving Ancef x 6 weeks through a PICC, who presents with shortness of breath and increased oxygen dependency x1 week.   #Acute on Chronic Multifactorial Diastolic CHF exacerbation - Acute on chronic diastolic heart failure is the most likely cause of dyspnea in this patient with a 20-30lb weight gain in ~3 weeks, signs of fluid overload on exam including crackles and LE edema, CXR showing pulmonary interstitial edema, and an increased pro-BNP=694. Echo on 09/19/12 showed moderate LVH, hyperdynamic systolic function with EF 70-75%, no wall motion abnormalities, moderate aortic stenosis 2/2 calcified annulus, not candidate of AVR.  - Monitor on telemetry  -  IV lasix 10mg /hr, spironolactone  25mg  BID,  2.5mg  BID metolazone until contraction alkalosis and increase in Cr - Foley in place -If vomiting may need to reduce diuretics - Monitor bicarb - still elevated, monitor for respiratory depression defer to cardiology for need for  demadex  - Candidate for AVR or TAVR if adequate weight loss - Monitor BMP, Mg BID - Continue O2 via [place]. Currently on 4, wean as tolerated. Goal 92-96%.  - Daily weights -  2 wt loss since yesterday, total 21 lb  - Strict I/Os - 0.648L loss since yesterday - Cardiologist Dr.  Eden Emms     #Orthostatic Hypotension - most likely due to hypovolemia due to diuresis  - Orthostatic VS daily  - unable to apply TED due to significant LE swelling and erythemas.  - Per Cardiology - d/c metoprolol for now   #Metabolic Alkalosis - contraction alkalosis due to diuretic use vs chronic hypoxic hypercapnia due to COPD - Monitor bicarb - still elevated,  defer to cardiology for need for  demadex  -Monitor for respiratory depression   #RLE cellulitis with MSSA bacteremia - Improving. On 6 weeks of IV abx to cover for endocarditis.  - Continue home Ancef via PICC, started 7/47for 6 weeks, until 9/11 - Continue home MS Contin bid  - Obtain blood cultures -Xray R foot 7/25 - diffuse soft tissue prominence suspicious for cellulitis  -Right LE Duplex US 7/27 - no evidence of DVT, SVT,  or baker's cyst -LE Arterial Duplex - moderate ABI, aortic iliac disease -Wound care consult  -PT consult - recommend home health PT and wide 3n1  #History of hypokalemia - K=3.8 in ED before Lasix. Patient with leg cramps now. Perhaps this issue is why she is not on an ACE-I. Will clarify with Dr. Eden Emms.  -K repletion BID - extra dose this AM--recheck BMP   - BMP, Mg BID     #DM2 - controlled, on metformin at home   Checking HbA1C - 6.7  -CBG AC and QHS   -Lantus 10U at bedtime  -Nutrition consult - Obese Class III - no further nutrition intervention  ative  #COPD on home O2 2L - PFTs 03/01/2012 shows moderate obstructive lung defect (FEV1/FVC 63%, RV 126%) and severely decreased diffusion capacity (DLCO 49%), no response to bronchodilators. On home oxygen. Unknown baseline O2 sat.  - Goal O2 sat 92-96%  - Will monitor for signs/symptoms of hypercapnia   #Hypothyroidism - S/p thyroidectomy. Last TSH 0.153 in 11/2011. 3.1cm soft tissue mass behind [place] joint possible thyroid nodule - TSH  WNL  - Continue home synthroid  -will need outpatient thyroid US -lactulose as needed for constipation    Normocytic Anemia - due to chronic disease (hypothyroidism) vs iron deficiency vs thalassemia vs GI blood loss - Last normal H/H 09/22/12, uptrending  -Obtain anemia panel   Thrombocytopenia -chronic since l10/19/13. Suspect due to splenomegaly.  In 2012 [place] abd with normal splenomegaly. On  10/13/12 CT chest, slight splenomegaly. Other possibilties include medication induced, ITP, HIT syndrome, pseuothrombocytopenia, and bone marrow suppression     - <100K since 11/2011, slowly uptrending -monitor for bleeding     #Elevated d-dimer - Her Wells' score is 3.5, earning points for clinical signs of DVT (asymmetric LE edema, erythema), PE being a likely diagnosis, and immobilization for at least 3 days. This places her in a high risk group with 40.6% chance of PE in an ED population. We will therefore proceed to definitive imaging. Cr 0.70.  - CTA to rule out pulmonary embolism - No PE  -CT findings: Small loculated R pleural effusion, probable PAH, hepatosplenomegaly, nodulary of the liver parenchyma c/w cirrhosis  #Chest tightness - x3weeks, improved after receiving Lasix in the ED. Likely 2/2 dyspnea and pulmonary edema. EKG WNL, but important to rule out coronary ischemia in this patient with significant co-morbidities including obesity, diabetes, heart failure.  - Trending troponins q6h x3  - neg  #History of hospitalization for acute encephalopathy - See EPIC notes from 10/17-10/20/13. LFTs stable. No AMS  currently.  - Continuing home lactulose   #Depression - Stable.  - Continue prozac   #Diet - Heart healthy   #DVT PPX - subq Heparin   Dispo: Disposition is deferred at this time, awaiting improvement of current medical problems.  Anticipated discharge in approximately 3 day(s).   The patient does have a current PCP Andrey Campanile Elizebeth Koller, MD) and does need an [facility] hospital follow-up appointment after discharge.  The patient does have transportation limitations that hinder  transportation to clinic appointments.  .Services Needed at time of discharge: Y = Yes, Blank = No PT:   OT:   RN:   Equipment:   Other:     LOS: 4 days   Otis Brace, MD 10/17/2012, 8:12 AM

## 2012-10-17 NOTE — Progress Notes (Signed)
Dr. Johna Roles in room with patient to assess for vaginal bleeding. Appears that patient may have been bleeding from vagina and not from urinary catheter. Patient not bleeding at this time. Rabbani to speak with Lia regarding discontinuation of foley catheter.

## 2012-10-17 NOTE — Progress Notes (Signed)
Patient complaining of bleeding from vagina. Minimal amount of blood was noted while washing up and after wiping appeared on wash cloth. No blood noted in urine. No sores noted around vaginal area. Dr. Johna Roles who is with Dr. Fran Lowes made aware of vaginal bleeding. Dr. Johna Roles to speak with Fran Lowes call back

## 2012-10-17 NOTE — Progress Notes (Signed)
CO2 result from [facility] in lab called with level of 41. Dr. Greggory Brandy and Dr. Mayford Knife already aware that CO2 levels elevated.

## 2012-10-17 NOTE — Progress Notes (Signed)
Potassium level 3.3. Dr. Johna Roles paged and made aware. Order for more supplemental Potassium received

## 2012-10-17 NOTE — Progress Notes (Addendum)
SUBJECTIVE:  N&V resolved  OBJECTIVE:   Vitals:   Filed Vitals:   10/17/12 1000 10/17/12 1005 10/17/12 1010 10/17/12 1014  BP: 126/36 123/48 116/56 129/57  Pulse: 91 90 100 93  Temp:      TempSrc:      Resp: 18 18 18 18   Height:      Weight:      SpO2:       I&O's:   Intake/Output Summary (Last 24 hours) at 10/17/12 1106 Last data filed at 10/17/12 1015  Gross per 24 hour  Intake    840 ml  Output   1648 ml  Net   -808 ml   TELEMETRY: Reviewed telemetry pt in NSR     PHYSICAL EXAM General: Well developed, well nourished, in no acute distress Head: Eyes PERRLA, No xanthomas.   Normal cephalic and atramatic  Lungs:   Clear bilaterally to auscultation and percussion. Heart:   HRRR S1 S2 Pulses are 2+ & equal. Abdomen: Bowel sounds are positive, abdomen soft and non-tender without masses  Extremities:   Erythema of LE bilaterally with 2-3+ edema Neuro: Alert and oriented X 3. Psych:  Good affect, responds appropriately   LABS: Basic Metabolic Panel:  Recent Labs  16/10/96 1800 10/17/12 0510  NA 132* 132*  K 3.1* 3.3*  CL 84* 83*  CO2 43* >45*  GLUCOSE 175* 155*  BUN 23 24*  CREATININE 1.17* 1.10  CALCIUM 8.8 8.9  MG 2.0 2.0   Liver Function Tests: No results found for this basename: AST, ALT, ALKPHOS, BILITOT, PROT, ALBUMIN,  in the last 72 hours No results found for this basename: LIPASE, AMYLASE,  in the last 72 hours CBC: No results found for this basename: WBC, NEUTROABS, HGB, HCT, MCV, PLT,  in the last 72 hours Cardiac Enzymes:  Recent Labs  10/14/12 1158  TROPONINI <0.30   Coag Panel:   Lab Results  Component Value Date   INR 1.08 10/25/2011   INR 1.09 07/31/2010   INR 1.05 12/13/2009    RADIOLOGY: Dg Chest 2 View  10/13/2012   CLINICAL DATA:  67 year old female with shortness of breath. Cellulitis.  EXAM: CHEST  2 VIEW  COMPARISON:  09/17/2012 and earlier.  FINDINGS: Right PICC line in place, tip at the level of the lower SVC. Small right  pleural effusion new or increased since 09/17/2012. Diffuse increased interstitial opacity also progressed. No pneumothorax. No consolidation. Stable cardiomegaly and mediastinal contours. Stable visualized osseous structures.  IMPRESSION: 1.  Small right pleural effusion is new/ increased.  2. Increased interstitial markings, favor pulmonary interstitial edema. Viral/ atypical infection is less likely.  3. Chronic cardiomegaly.  4. Right PICC line in place, tip at the lower SVC level.   Electronically Signed   By: Augusto Gamble   On: 10/13/2012 15:55   Dg Tibia/fibula Right  09/17/2012   *RADIOLOGY REPORT*  Clinical Data: Lower leg pain, swelling and erythema extending into the foot for 3 weeks.  RIGHT TIBIA AND FIBULA - 2 VIEW  Comparison: Right knee MRI 10/31/2007.  Findings: The mineralization and alignment are normal.  There is no evidence of acute fracture, dislocation or bone destruction.  There is mildly progressive medial joint space loss at the knee.  The soft tissues of the lower leg are diffusely prominent with suspected generalized subcutaneous edema.  There is no evidence of foreign body or soft tissue emphysema.  IMPRESSION: Suspected diffuse lower leg soft tissue edema suggestive of cellulitis in this clinical context.  No soft tissue emphysema, foreign body or osseous abnormality identified.   Original Report Authenticated By: Carey Bullocks, M.D.   Ct Angio Chest Pe W/cm &/or Wo Cm  10/14/2012   *RADIOLOGY REPORT*  Clinical Data: Shortness of breath and cough.  Hypoxia.  CT ANGIOGRAPHY CHEST  Technique:  Multidetector CT imaging of the chest using the standard protocol during bolus administration of intravenous contrast. Multiplanar reconstructed images including MIPs were obtained and reviewed to evaluate the vascular anatomy.  Contrast: OMNIPAQUE IOHEXOL 350 MG/ML SOLN  Comparison: Chest x-ray dated 10/13/2012  Findings: There is cardiomegaly.  There is a small loculated right pleural  effusion.  There is pulmonary vascular congestion of the origin of the main pulmonary arteries suggestive of pulmonary arterial hypertension.    No pulmonary emboli.   There are a few scattered lymph nodes in the mediastinum and left hilum, and none of which are pathologically enlarged.  There is a 3.1 cm soft tissue mass extending behind the right sternoclavicular joint which probably represents a thyroid nodule.  The patient appears to have had previous thyroid surgery on the right and left.  There is slight splenomegaly.  The patient has hepatomegaly with slight nodularity of the liver contour suggesting cirrhosis.  IMPRESSION: 1.  No pulmonary emboli. 2.  Cardiomegaly with right pleural effusion and pulmonary vascular congestion and probable pulmonary arterial hypertension. 3.  Hepatosplenomegaly.  Nodularity of the liver parenchyma consistent with cirrhosis.   Original Report Authenticated By: Francene Boyers, M.D.   Dg Chest Port 1 View  09/17/2012   *RADIOLOGY REPORT*  Clinical Data: Cough and congestion.  PORTABLE CHEST - 1 VIEW  Comparison: 12/12/2011  Findings: Semi upright view of the chest demonstrates low lung volumes. There are prominent interstitial lung markings.  Heart size is upper limits of normal.  Trachea is midline.  Previous right shoulder surgery.  IMPRESSION: Low lung volumes.  Low lung volumes may be accentuating the size of the heart and interstitial lung markings.  Difficult to exclude mild edema.   Original Report Authenticated By: Richarda Overlie, M.D.   Dg Foot Complete Right  09/17/2012   *RADIOLOGY REPORT*  Clinical Data: Lower leg pain, swelling and erythema extending into the foot for 3 weeks.  RIGHT FOOT COMPLETE - 3+ VIEW  Comparison: None.  Findings: Within the foot, the bones appear mildly demineralized. There is no evidence of acute fracture, dislocation or bone destruction.  The soft tissues appear diffusely prominent, especially in the dorsum of the forefoot.  There is no  evidence of foreign body or soft tissue emphysema.  IMPRESSION: Diffuse soft tissue prominence suspicious for cellulitis in this clinical context.  No evidence of foreign body or osteomyelitis.   Original Report Authenticated By: Carey Bullocks, M.D.    Assessment and Plan:  1. Acute on chronic multifactorial diastolic CHF / acute on chronic respiratory failure  2. Mod-severe aortic stenosis. EF 70%  3. Morbid obesity  4. Spinal stenosis  5. Chronic pain  6. Severe deconditioning  7. PAH by echo  8. COPD on home O2  9. Recent MSSA bacteremia and R leg cellulits, abx through 10/24/12 via PICC  10. Hypokalemia  11. N/V of unknown etiology - resolved - replete potassium  - follow renal function closely  - continue IV Lasix gtt/Metolazone since she still appears volume overloaded - bicarb increased  today so will add Diamox       Quintella Reichert, MD  10/17/2012  11:06 AM

## 2012-10-18 LAB — BASIC METABOLIC PANEL
BUN: 26 mg/dL — ABNORMAL HIGH (ref 6–23)
CO2: 42 mEq/L (ref 19–32)
Calcium: 9.3 mg/dL (ref 8.4–10.5)
Calcium: 9.2 mg/dL (ref 8.4–10.5)
Chloride: 81 mEq/L — ABNORMAL LOW (ref 96–112)
Chloride: 80 mEq/L — ABNORMAL LOW (ref 96–112)
Creatinine, Ser: 1.28 mg/dL — ABNORMAL HIGH (ref 0.50–1.10)
GFR calc Af Amer: 49 mL/min — ABNORMAL LOW (ref >90)
GFR calc non Af Amer: 45 mL/min — ABNORMAL LOW (ref >90)
Glucose, Bld: 146 mg/dL — ABNORMAL HIGH (ref 70–99)
Potassium: 3.1 mEq/L — ABNORMAL LOW (ref 3.5–5.1)
Sodium: 129 mEq/L — ABNORMAL LOW (ref 135–145)

## 2012-10-18 LAB — GLUCOSE, CAPILLARY: Glucose-Capillary: 191 mg/dL — ABNORMAL HIGH (ref 70–99)

## 2012-10-18 LAB — CBC
HCT: 37.6 % (ref 36.0–46.0)
Hemoglobin: 12.3 g/dL (ref 12.0–15.0)
MCH: 29.8 pg (ref 26.0–34.0)
MCHC: 32.7 g/dL (ref 30.0–36.0)

## 2012-10-18 LAB — PROTIME-INR: INR: 1.23 (ref 0.00–1.49)

## 2012-10-18 LAB — RETICULOCYTES
RBC.: 4.13 MIL/uL (ref 3.87–5.11)
Retic Count, Absolute: 194.1 10*3/uL — ABNORMAL HIGH (ref 19.0–186.0)

## 2012-10-18 LAB — FERRITIN: Ferritin: 149 ng/mL (ref 10–291)

## 2012-10-18 LAB — LACTIC ACID, PLASMA: Lactic Acid, Venous: 2.6 mmol/L — ABNORMAL HIGH (ref 0.5–2.2)

## 2012-10-18 LAB — FOLATE: Folate: 17.1 ng/mL

## 2012-10-18 LAB — IRON AND TIBC: TIBC: 422 ug/dL (ref 250–470)

## 2012-10-18 MED ORDER — FLUCONAZOLE 100 MG PO TABS: 100.0000 mg | ORAL_TABLET | Freq: Every day | ORAL | Status: DC

## 2012-10-18 MED ORDER — CEFAZOLIN SODIUM-DEXTROSE 2-3 GM-% IV SOLR
2.0000 g | Freq: Three times a day (TID) | INTRAVENOUS | Status: DC
  Administered 2012-10-18 – 2012-10-20 (×6): 2 g via INTRAVENOUS
  Filled 2012-10-18 (×9): qty 50

## 2012-10-18 MED ORDER — INSULIN ASPART 100 UNIT/ML ~~LOC~~ SOLN
3.0000 [IU] | Freq: Three times a day (TID) | SUBCUTANEOUS | Status: DC
  Administered 2012-10-18 – 2012-10-19 (×2): 3 [IU] via SUBCUTANEOUS

## 2012-10-18 MED ORDER — FLUCONAZOLE 100 MG PO TABS
100.0000 mg | ORAL_TABLET | Freq: Every day | ORAL | Status: AC
  Administered 2012-10-19: 100 mg via ORAL
  Filled 2012-10-18: qty 1

## 2012-10-18 MED ORDER — POTASSIUM CHLORIDE CRYS ER 20 MEQ PO TBCR
40.0000 meq | EXTENDED_RELEASE_TABLET | Freq: Once | ORAL | Status: AC
  Administered 2012-10-18: 40 meq via ORAL

## 2012-10-18 MED ORDER — POTASSIUM CHLORIDE CRYS ER 20 MEQ PO TBCR
40.0000 meq | EXTENDED_RELEASE_TABLET | Freq: Three times a day (TID) | ORAL | Status: DC
  Administered 2012-10-18 – 2012-10-20 (×5): 40 meq via ORAL
  Filled 2012-10-18 (×9): qty 2

## 2012-10-18 MED ORDER — POTASSIUM CHLORIDE CRYS ER 20 MEQ PO TBCR
40.0000 meq | EXTENDED_RELEASE_TABLET | Freq: Once | ORAL | Status: AC
  Administered 2012-10-19: 40 meq via ORAL
  Filled 2012-10-18: qty 2

## 2012-10-18 NOTE — Progress Notes (Signed)
Primary Physician: [name], MD Primary Cardiologist: Eden Emms. Also seen 02/2012 by Cooper/Owen in TAVR clinic  Chief Complaint: SOB, a/c LEE,  weight gain 20-30lb   HF Rounding NOTE    HPI: [name] is a 67 y/o F with morbid obesity, moderate to severe AS, spinal stenosis (uses mostly wheelchair), COPD on home O2, and chronic pain requiring narcotics admitted with recurrent volume overload and resp distress. Getting IV abx due to recent bacteremia.   Over the weekend she continued on Lasix drip and metolazone. . Weight 272->248. Baseline 230-240    Inpatient Medications:  . acetaZOLAMIDE  500 mg Oral BID  . ceFAZolin  2 g Intravenous Q8H  . fluconazole  100 mg Oral Daily  . FLUoxetine  20 mg Oral Daily  . heparin  5,000 Units Subcutaneous Q8H  . hydrocerin  1 application Topical Daily  . insulin glargine  10 Units Subcutaneous QHS  . levothyroxine  137 mcg Oral QAC breakfast  . metolazone  2.5 mg Oral BID  . morphine  60 mg Oral Q12H  . potassium chloride SA  40 mEq Oral BID  . sodium chloride  10-40 mL Intracatheter Q12H  . sodium chloride  3 mL Intravenous Q12H  . spironolactone  25 mg Oral BID   . furosemide (LASIX) infusion 10 mg/hr (10/17/12 1204)    Allergies:  Allergies  Allergen Reactions  . Latex     REACTION: itch  . Penicillins     REACTION: hives - tolerates Ancef    Physical Exam: Blood pressure 113/39, pulse 86, temperature 98.6 F (37 C), temperature source Oral, resp. rate 20, height 5' (1.524 m), weight 117.119 kg (258 lb 3.2 oz), SpO2 96.00%. General: Well developed, obese WF in no acute distress. Head: Normocephalic, atraumatic, sclera non-icteric, no xanthomas, nares are without discharge.  Neck: JVD unable to see. Bilateral carotid bruits, question radiation from murmur. Lungs: Diminished BS at bases and coarse otherwise. Breathing is unlabored. Heart: RRR with S1 S2. 2/6 SEM RUSB. Unable to fully auscultate S2 clearly. Abdomen:  Massively obese, non-tender, non-distended with normoactive bowel sounds.  Msk:  Strength and tone appear normal for age. Extremities: No clubbing or cyanosis. Bilateral LE erythematous skin changes and significant LEE R>L with cracking of the skin. RLE is wrapped.  Neuro: Alert and oriented X 3. No facial asymmetry. No focal deficit. Moves all extremities spontaneously. Psych:  Responds to questions appropriately with a pleasant affect.   Labs: No results found for this basename: CKTOTAL, CKMB, TROPONINI,  in the last 72 hours Lab Results  Component Value Date   WBC 7.9 10/18/2012   HGB 12.3 10/18/2012   HCT 37.6 10/18/2012   MCV 91.0 10/18/2012   PLT 162 10/18/2012    Recent Labs Lab 10/13/12 1419  10/18/12 0523  NA 139  < > 132*  K 3.8  < > 3.1*  CL 100  < > 81*  CO2 34*  < > 44*  BUN 13  < > 26*  CREATININE 0.73  < > 1.14*  CALCIUM 8.5  < > 9.3  PROT 7.0  --   --   BILITOT 0.7  --   --   ALKPHOS 144*  --   --   ALT <5  --   --   AST 43*  --   --   GLUCOSE 104*  < > 146*  < > = values in this interval not displayed.  Lab Results  Component Value Date  DDIMER 2.00* 10/13/2012    Radiology/Studies:  Dg Chest 2 View  10/13/2012   CLINICAL DATA:  67 year old female with shortness of breath. Cellulitis.  EXAM: CHEST  2 VIEW  COMPARISON:  09/17/2012 and earlier.  FINDINGS: Right PICC line in place, tip at the level of the lower SVC. Small right pleural effusion new or increased since 09/17/2012. Diffuse increased interstitial opacity also progressed. No pneumothorax. No consolidation. Stable cardiomegaly and mediastinal contours. Stable visualized osseous structures.  IMPRESSION: 1.  Small right pleural effusion is new/ increased.  2. Increased interstitial markings, favor pulmonary interstitial edema. Viral/ atypical infection is less likely.  3. Chronic cardiomegaly.  4. Right PICC line in place, tip at the lower SVC level.   Electronically Signed   By: Augusto Gamble   On: 10/13/2012  15:55    Ct Angio Chest Pe W/cm &/or Wo Cm 10/14/2012   *RADIOLOGY REPORT*  Clinical Data: Shortness of breath and cough.  Hypoxia.  CT ANGIOGRAPHY CHEST  Technique:  Multidetector CT imaging of the chest using the standard protocol during bolus administration of intravenous contrast. Multiplanar reconstructed images including MIPs were obtained and reviewed to evaluate the vascular anatomy.  Contrast: OMNIPAQUE IOHEXOL 350 MG/ML SOLN  Comparison: Chest x-ray dated 10/13/2012  Findings: There is cardiomegaly.  There is a small loculated right pleural effusion.  There is pulmonary vascular congestion of the origin of the main pulmonary arteries suggestive of pulmonary arterial hypertension.    No pulmonary emboli.   There are a few scattered lymph nodes in the mediastinum and left hilum, and none of which are pathologically enlarged.  There is a 3.1 cm soft tissue mass extending behind the right sternoclavicular joint which probably represents a thyroid nodule.  The patient appears to have had previous thyroid surgery on the right and left.  There is slight splenomegaly.  The patient has hepatomegaly with slight nodularity of the liver contour suggesting cirrhosis.  IMPRESSION: 1.  No pulmonary emboli. 2.  Cardiomegaly with right pleural effusion and pulmonary vascular congestion and probable pulmonary arterial hypertension. 3.  Hepatosplenomegaly.  Nodularity of the liver parenchyma consistent with cirrhosis.   Original Report Authenticated By: Francene Boyers, M.D.   EKG: NSR 88bpm no acute change from prior, PAC, QTc 478   Assessment and Plan:  1. 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 Mod-severe aortic stenosis. EF 70% 3. Morbid obesity 4. Spinal stenosis 5. Chronic pain 6. Severe deconditioning 7. PAH by echo 8. COPD on home O2 9. Recent MSSA bacteremia and R leg cellulits, abx through 10/24/12 via PICC 10. Hypokalemia 11. Metabolic  alkalosis  Volume status improving. Renal function ok. Continue diuresis  CLEGG,AMY NP-C   Patient seen and examined with Tonye Becket, NP. We discussed all aspects of the encounter. I agree with the assessment and plan as stated above.   She continues to diurese steadily on current regimen. Alkalosis improving. Renal function stable. Suspect u/o will pick up with addition of diamox. Continue current regimen.   Cellulitis persists. Continue abx.   I told her she would not be a candidate for AVR or TAVR in future unless she loses weight and improves her conditioning. She has had a Nutrition consult already while in house.   Shawnie Nicole,MD 9:46 AM

## 2012-10-18 NOTE — Progress Notes (Signed)
Physical Therapy Treatment Patient Details Name: [name] MRN: [id number] DOB: 11-Aug-1945 Today's Date: 10/18/2012 Time: 4098-1191 PT Time Calculation (min): 17 min  PT Assessment / Plan / Recommendation  History of Present Illness 67 yr old female with severe AS, HFpEF (Chronic diastolic heart failure), morbid obesity Body mass index is 52.67 kg/(m^2)., Chronic respiratory failure due to COPD on home O2, chronic pain syndrome, with SOB and weight gain. She has evidence of acute on chronic diastolic heart failure.  She is currently on treatment for MSSA bacteremia and Right LE cellulitis via PICC line.  She has not been a candidate for TAVR.  She feels slightly better today after being started on IV diuretics, as she had a 20-30 lb weight gain recently.   PT Comments   Pt c/o bil LE pain today which limited her ability to increase ambulation distance.    Follow Up Recommendations  Home health PT;Supervision/Assistance - 24 hour     Does the patient have the potential to tolerate intense rehabilitation     Barriers to Discharge        Equipment Recommendations   (wide 3-in-1)    Recommendations for Other Services    Frequency Min 3X/week   Progress towards PT Goals Progress towards PT goals: Not progressing toward goals - comment (amb distance limited by LE pain)  Plan Current plan remains appropriate    Precautions / Restrictions Precautions Precautions: Fall Restrictions Weight Bearing Restrictions: No   Pertinent Vitals/Pain C/o bil LE pain    Mobility  Bed Mobility Bed Mobility: Not assessed Details for Bed Mobility Assistance: Pt sitting on EOB upon arrival Transfers Transfers: Sit to Stand;Stand to Sit Sit to Stand: 4: Min guard;With upper extremity assist;From bed Stand to Sit: 4: Min guard;With upper extremity assist;With armrests;To chair/3-in-1 Ambulation/Gait Ambulation/Gait Assistance: 4: Min guard Ambulation Distance (Feet): 50 Feet Assistive device:  Rolling walker Ambulation/Gait Assistance Details: cues to stay closer to RW & tall posture.  Distance limited today due to bil LE pain.   Gait Pattern: Step-through pattern;Decreased stride length;Shuffle Gait velocity: decreased Stairs: No Wheelchair Mobility Wheelchair Mobility: No       PT Goals (current goals can now be found in the care plan section) Acute Rehab PT Goals PT Goal Formulation: With patient Time For Goal Achievement: 10/21/12 Potential to Achieve Goals: Fair  Visit Information  Last PT Received On: 10/18/12 Assistance Needed: +1 History of Present Illness: 67 yr old female with severe AS, HFpEF (Chronic diastolic heart failure), morbid obesity Body mass index is 52.67 kg/(m^2)., Chronic respiratory failure due to COPD on home O2, chronic pain syndrome, with SOB and weight gain. She has evidence of acute on chronic diastolic heart failure.  She is currently on treatment for MSSA bacteremia and Right LE cellulitis via PICC line.  She has not been a candidate for TAVR.  She feels slightly better today after being started on IV diuretics, as she had a 20-30 lb weight gain recently.    Subjective Data      Cognition  Cognition Arousal/Alertness: Awake/alert Behavior During Therapy: WFL for tasks assessed/performed Overall Cognitive Status: Within Functional Limits for tasks assessed    Balance     End of Session PT - End of Session Equipment Utilized During Treatment: Gait belt;Oxygen (4L 02) Activity Tolerance: Patient limited by pain Patient left: in chair;with call bell/phone within reach;with family/visitor present Nurse Communication: Mobility status   GP     Lara Mulch 10/18/2012, 3:10 PM  Verdell Face, [place] [phone] 10/18/2012

## 2012-10-18 NOTE — Progress Notes (Signed)
Pt's husband concerned about pt's BP being too low.  Rechecked BP manually and got 130/50.  Informed pt and pt's husband that this was normal for pt.  Will continue to monitor.

## 2012-10-18 NOTE — Progress Notes (Signed)
  Date: 10/18/2012  Patient name: KEELIE ZEMANEK  Medical record number: [id number]  Date of birth: Oct 27, 1945   This patient has been seen and the plan of care was discussed with the house staff. Please see their note for complete details. I concur with their findings.  Inez Catalina, MD 10/18/2012, 11:19 AM

## 2012-10-18 NOTE — Progress Notes (Signed)
Followed up with wound nurse concerning pt's leg wound.  Spoke with the wound nurse who informed me that she seen pt last Friday and went over plan of care concerning the leg wound with pt and pt's husband.  Leg washed and dressing changed, will continue to monitor.

## 2012-10-18 NOTE — Progress Notes (Signed)
Patient prefers to sleep while sitting. Reminded to call for assistance to [facility]. Bed alarm on.

## 2012-10-18 NOTE — Consult Note (Signed)
WOC consult requested for right leg; performed on 8/22.  Refer to previous progress note for assessment and plan of care. Please re-consult if further assistance is needed.  Thank-you,  Cammie Mcgee MSN, RN, CWOCN, [place], CNS [phone]

## 2012-10-18 NOTE — Progress Notes (Addendum)
Subjective:  Pt seen and examined in AM. Pt reports she is not feel well today. She reports feeling tired and sleepy. Her shortness of breath is the same. No nausea, vomiting, abdominal pain, palpitations, CP, or lightheadedness. Her vaginal bleeding has subsided and does not report any more blood when she wipes.    Objective: Vital signs in last 24 hours: Filed Vitals:   10/18/12 0635 10/18/12 0636 10/18/12 0637 10/18/12 0640  BP: 140/55 136/50 129/48 124/50  Pulse: 88 79 88 91  Temp: 98.6 F (37 C)     TempSrc: Oral     Resp: 18 18 18    Height:      Weight:    112.81 kg (248 lb 11.2 oz)  SpO2: 94% 93% 94%    Weight change: -1.225 kg (-2 lb 11.2 oz)  Intake/Output Summary (Last 24 hours) at 10/18/12 1317 Last data filed at 10/18/12 1058  Gross per 24 hour  Intake    450 ml  Output   5075 ml  Net  -4625 ml   Head: Normocephalic and atraumatic.  Mouth/Throat: Oropharynx is clear and moist.  Eyes: Conjunctivae and EOM are normal. Pupils are equal, round, and reactive to light.  Neck: Normal range of motion. Neck supple. No JVD  Cardiovascular: Normal rate, regular rhythm and intact distal pulses. Systolic III/VI murmur, 2+ pitting edema to the knees  Pulmonary/Chest: Effort normal. No respiratory distress. She has no wheezes. Coarse sounds at bases.  She exhibits no tenderness.  Abdominal: Soft. Bowel sounds are normal. She exhibits no distension. There is no tenderness.  Musculoskeletal: Normal range of motion. She exhibits no tenderness Neurological: She is alert and oriented to person, place, and time.  Skin: Skin is warm and dry. She is not diaphoretic. RLE wrapped   PICC line intact with no surrounding erythema   Lab Results: Basic Metabolic Panel:  Recent Labs Lab 10/17/12 0510 10/17/12 1200 10/18/12 0523  NA 132* 129* 132*  K 3.3* 3.3* 3.1*  CL 83* 81* 81*  CO2 >45* 41* 44*  GLUCOSE 155* 263* 146*  BUN 24* 25* 26*  CREATININE 1.10 1.15* 1.14*  CALCIUM  8.9 8.7 9.3  MG 2.0  --  2.4   Liver Function Tests:  Recent Labs Lab 10/13/12 1419  AST 43*  ALT <5  ALKPHOS 144*  BILITOT 0.7  PROT 7.0  ALBUMIN 2.5*   No results found for this basename: LIPASE, AMYLASE,  in the last 168 hours No results found for this basename: AMMONIA,  in the last 168 hours CBC:  Recent Labs Lab 10/13/12 1419 10/13/12 2123 10/18/12 0523  WBC 5.2 6.2 7.9  NEUTROABS 3.3  --   --   HGB 10.2* 11.1* 12.3  HCT 32.4* 34.8* 37.6  MCV 91.5 91.1 91.0  PLT 138* 171 162   Cardiac Enzymes:  Recent Labs Lab 10/14/12 0045 10/14/12 0454 10/14/12 1158  TROPONINI <0.30 <0.30 <0.30   BNP:  Recent Labs Lab 10/13/12 1419  PROBNP 694.1*   D-Dimer:  Recent Labs Lab 10/13/12 1419  DDIMER 2.00*   CBG:  Recent Labs Lab 10/17/12 0654 10/17/12 1116 10/17/12 1632 10/17/12 2043 10/18/12 0633 10/18/12 1217  GLUCAP 139* 184* 172* 227* 137* 191*   Hemoglobin A1C:  Recent Labs Lab 10/14/12 0454  HGBA1C 6.7*   Fasting Lipid Panel: No results found for this basename: CHOL, HDL, LDLCALC, TRIG, CHOLHDL, LDLDIRECT,  in the last 168 hours Thyroid Function Tests:  Recent Labs Lab 10/14/12 0454  TSH 1.202   Coagulation: No results found for this basename: LABPROT, INR,  in the last 168 hours Anemia Panel:  Recent Labs Lab 10/18/12 0523  VITAMINB12 718  FOLATE 17.1  FERRITIN 149  TIBC 422  IRON 50  RETICCTPCT 4.7*   Urine Drug Screen: Drugs of Abuse     Component Value Date/Time   LABOPIA NEGATIVE 01/15/2009 0117   COCAINSCRNUR NEGATIVE 01/15/2009 0117   LABBENZ  Value: POSITIVE (NOTE) Sent for confirmatory testing Result repeated and verified.* 01/15/2009 0117   AMPHETMU NEGATIVE 01/15/2009 0117    Alcohol Level: No results found for this basename: ETH,  in the last 168 hours Urinalysis:  Recent Labs Lab 10/13/12 1541 10/14/12 1326  COLORURINE YELLOW YELLOW  LABSPEC 1.010 1.014  PHURINE 6.0 7.5  GLUCOSEU NEGATIVE  NEGATIVE  HGBUR NEGATIVE NEGATIVE  BILIRUBINUR NEGATIVE NEGATIVE  KETONESUR NEGATIVE NEGATIVE  PROTEINUR NEGATIVE NEGATIVE  UROBILINOGEN 0.2 1.0  NITRITE NEGATIVE NEGATIVE  LEUKOCYTESUR NEGATIVE NEGATIVE   Misc. Labs:   Micro Results: Recent Results (from the past 240 hour(s))  CULTURE, BLOOD (ROUTINE X 2)     Status: None   Collection Time    10/14/12 11:58 AM      Result Value Range Status   Specimen Description BLOOD LEFT ARM   Final   Special Requests BOTTLES DRAWN AEROBIC AND ANAEROBIC 10CC   Final   Culture  Setup Time     Final   Value: 10/14/2012 16:39     Performed at [facility]   Culture     Final   Value:        BLOOD CULTURE RECEIVED NO GROWTH TO DATE CULTURE WILL BE HELD FOR 5 DAYS BEFORE ISSUING A FINAL NEGATIVE REPORT     Performed at [facility]   Report Status PENDING   Incomplete  CULTURE, BLOOD (ROUTINE X 2)     Status: None   Collection Time    10/14/12 12:05 PM      Result Value Range Status   Specimen Description BLOOD LEFT HAND   Final   Special Requests BOTTLES DRAWN AEROBIC AND ANAEROBIC 10CC   Final   Culture  Setup Time     Final   Value: 10/14/2012 16:39     Performed at [facility]   Culture     Final   Value:        BLOOD CULTURE RECEIVED NO GROWTH TO DATE CULTURE WILL BE HELD FOR 5 DAYS BEFORE ISSUING A FINAL NEGATIVE REPORT     Performed at [facility]   Report Status PENDING   Incomplete   Studies/Results: No results found. Medications: I have reviewed the patient's current medications. Scheduled Meds: . acetaZOLAMIDE  500 mg Oral BID  . ceFAZolin  2 g Intravenous Q8H  . fluconazole  100 mg Oral Daily  . FLUoxetine  20 mg Oral Daily  . heparin  5,000 Units Subcutaneous Q8H  . hydrocerin  1 application Topical Daily  . insulin glargine  10 Units Subcutaneous QHS  . levothyroxine  137 mcg Oral QAC breakfast  . metolazone  2.5 mg Oral BID  . morphine  60 mg Oral Q12H  . potassium chloride SA  40  mEq Oral TID  . sodium chloride  10-40 mL Intracatheter Q12H  . sodium chloride  3 mL Intravenous Q12H  . spironolactone  25 mg Oral BID   Continuous Infusions: . furosemide (LASIX) infusion 10 mg/hr (10/17/12 1204)   PRN Meds:.sodium  chloride, lactulose, ondansetron (ZOFRAN) IV, sodium chloride, sodium chloride Assessment/Plan: Principal Problem:   Acute exacerbation of CHF (congestive heart failure) Active Problems:   Type I (juvenile type) diabetes mellitus with peripheral circulatory disorders, not stated as uncontrolled(250.71)  Assessment & Plan by Problem:  [name] is a 67 y.o. female PMH moderate aortic stenosis, diastolic CHF (EF 40-98%), COPD (on 2L home O2), chronic venous insufficiency, recent admission from 7/25-7/31/14 for RLE cellulitis complicated by MSSA bacteremia, receiving Ancef x 6 weeks through a PICC, who presents with shortness of breath and increased oxygen dependency x1 week.   #Acute on Chronic Multifactorial Diastolic CHF exacerbation - Acute on chronic diastolic heart failure is the most likely cause of dyspnea in this patient with a 20-30lb weight gain in ~3 weeks, signs of fluid overload on exam including crackles and LE edema, CXR showing pulmonary interstitial edema, and an increased pro-BNP=694. Echo on 09/19/12 showed moderate LVH, hyperdynamic systolic function with EF 70-75%, no wall motion abnormalities, moderate aortic stenosis 2/2 calcified annulus, not candidate of AVR.  - Monitor on telemetry  -  IV lasix 10mg /hr, spironolactone  25mg  BID,  2.5mg  BID metolazone until contraction alkalosis and increase in Cr  -On diamox for alkalosis - Foley in place - Per cards can remove if pt  -If vomiting may need to reduce diuretics - Candidate for AVR or TAVR if adequate weight loss - Monitor BMP, Mg BID - Continue O2 via [place]. Currently on 4, wean as tolerated. Goal 92-96%.  - Daily weights -  2 wt loss since yesterday, total 24 lb  - Strict I/Os -  5L loss since yesterday - Cardiologist Dr. Eden Emms     # Hypokalemia - most likely due to chronic diuretic use vs RTA (type 2) - however low bicarb (12-20) characteristic     - K repletion TID    - Monitor BMP, Mg BID  #Orthostatic Hypotension - most likely due to hypovolemia due to diuresis  - Orthostatic VS - normal   - unable to apply TED due to significant LE swelling and erythemas.  - Per Cardiology - hold metoprolol for now   #Metabolic Alkalosis - contraction alkalosis due to diuretic use vs chronic hypoxic hypercapnia due to COPD - Monitor bicarb - currently on diamox since 8/24 -Monitor for respiratory depression   #Vaginal bleeding - resolved, possibly blood from foley catheter, however physical examination revealed mild blood in vaginal vault. She is s/p hysterectomy.  -Consulted Dr. Erin Fulling over phone who suggested that due to no current active bleeding and difficulty in examination in hospital setting and resolution of bleeding to follow-up as outpatient with her or with her OBGYN  -PT normal, PTT prolonged (past history of prolonged PTT)  #RLE cellulitis with MSSA bacteremia - Improving. On 6 weeks of IV abx to cover for endocarditis.  - Continue home Ancef via PICC, started 7/31 for 6 weeks, until 8/31 - Continue home MS Contin bid  - Obtain blood cultures - no growth to date -Xray R foot 7/25 - diffuse soft tissue prominence suspicious for cellulitis  -Right LE Duplex US 7/27 - no evidence of DVT, SVT,  or baker's cyst -LE Arterial Duplex - moderate ABI, aortic iliac disease -Wound care consult - 8/22  No open wound visible.  Applied foam dressing and secured with kerlex to absorb drainage and protect from further injury. -PT consult - recommend home health PT and wide 3n1   #DM2 - controlled, on metformin at  home   Checking HbA1C - 6.7  -CBG AC and QHS   -Lantus 10U at bedtime , Novolog 3 U -Nutrition consult - Obese Class III - no further nutrition  intervention    #COPD on home O2 2L - PFTs 03/01/2012 shows moderate obstructive lung defect (FEV1/FVC 63%, RV 126%) and severely decreased diffusion capacity (DLCO 49%), no response to bronchodilators. On home oxygen. Unknown baseline O2 sat.  - Goal O2 sat 92-96%  - Will monitor for signs/symptoms of hypercapnia   #Hypothyroidism - S/p thyroidectomy. Last TSH 0.153 in 11/2011. 3.1cm soft tissue mass behind [place] joint possible thyroid nodule - TSH WNL  - Continue home synthroid  -will need outpatient thyroid US -lactulose as needed for constipation   Normocytic Anemia - due to chronic disease (hypothyroidism) vs iron deficiency vs thalassemia vs GI blood loss -Last normal H/H 09/22/12, uptrending  -Obtain anemia panel - iron, ferritin, TIBC, vit B12, folate normal, iron sat low -Blood smear with polychromasia  Thrombocytopenia -chronic since l10/19/13. Suspect due to splenomegaly.  In 2012 [place] abd with normal splenomegaly. On  10/13/12 CT chest, slight splenomegaly. Other possibilties include medication induced, ITP, HIT syndrome, pseuothrombocytopenia, and bone marrow suppression     - <100K since 11/2011, slowly uptrending -monitor for bleeding     #Elevated d-dimer - Her Wells' score is 3.5, earning points for clinical signs of DVT (asymmetric LE edema, erythema), PE being a likely diagnosis, and immobilization for at least 3 days. This places her in a high risk group with 40.6% chance of PE in an ED population. We will therefore proceed to definitive imaging. Cr 0.70.  - CTA to rule out pulmonary embolism - No PE  -CT findings: Small loculated R pleural effusion, probable PAH, hepatosplenomegaly, nodulary of the liver parenchyma c/w cirrhosis  #Chest tightness -resolved,  x3weeks, improved after receiving Lasix in the ED. Likely 2/2 dyspnea and pulmonary edema. EKG WNL, but important to rule out coronary ischemia in this patient with significant co-morbidities including obesity, diabetes,  heart failure.  - Trending troponins q6h x3  - neg  #History of hospitalization for acute encephalopathy - See EPIC notes from 10/17-10/20/13. LFTs stable. No AMS  currently.  - Continuing home lactulose   #Depression - Stable.  - Continue prozac   #Diet - Heart healthy   #DVT PPX - subq Heparin   Dispo: Disposition is deferred at this time, awaiting improvement of current medical problems.  Anticipated discharge in approximately 3 day(s).   The patient does have a current PCP Andrey Campanile Elizebeth Koller, MD) and does need an [facility] hospital follow-up appointment after discharge.  The patient does have transportation limitations that hinder transportation to clinic appointments.  .Services Needed at time of discharge: Y = Yes, Blank = No PT:   OT:   RN:   Equipment:   Other:     LOS: 5 days   [name] Brace, MD 10/18/2012, 1:17 PM

## 2012-10-19 ENCOUNTER — Inpatient Hospital Stay (HOSPITAL_COMMUNITY): Payer: Medicare Other

## 2012-10-19 DIAGNOSIS — G9340 Encephalopathy, unspecified: Secondary | ICD-10-CM

## 2012-10-19 DIAGNOSIS — L0291 Cutaneous abscess, unspecified: Secondary | ICD-10-CM

## 2012-10-19 DIAGNOSIS — E860 Dehydration: Secondary | ICD-10-CM

## 2012-10-19 DIAGNOSIS — E873 Alkalosis: Secondary | ICD-10-CM

## 2012-10-19 DIAGNOSIS — R7309 Other abnormal glucose: Secondary | ICD-10-CM

## 2012-10-19 DIAGNOSIS — R739 Hyperglycemia, unspecified: Secondary | ICD-10-CM

## 2012-10-19 DIAGNOSIS — J9610 Chronic respiratory failure, unspecified whether with hypoxia or hypercapnia: Secondary | ICD-10-CM

## 2012-10-19 DIAGNOSIS — J9620 Acute and chronic respiratory failure, unspecified whether with hypoxia or hypercapnia: Secondary | ICD-10-CM

## 2012-10-19 DIAGNOSIS — J449 Chronic obstructive pulmonary disease, unspecified: Secondary | ICD-10-CM

## 2012-10-19 LAB — BLOOD GAS, ARTERIAL
Acid-Base Excess: 16.9 mmol/L — ABNORMAL HIGH (ref 0.0–2.0)
Drawn by: 257081
O2 Content: 4 L/min
O2 Saturation: 89.1 %
pCO2 arterial: 58.5 mmHg (ref 35.0–45.0)
pO2, Arterial: 56.6 mmHg — ABNORMAL LOW (ref 80.0–100.0)

## 2012-10-19 LAB — URINALYSIS, ROUTINE W REFLEX MICROSCOPIC
Nitrite: NEGATIVE
Specific Gravity, Urine: 1.009 (ref 1.005–1.030)
Urobilinogen, UA: 1 mg/dL (ref 0.0–1.0)
pH: 7.5 (ref 5.0–8.0)

## 2012-10-19 LAB — BASIC METABOLIC PANEL
BUN: 28 mg/dL — ABNORMAL HIGH (ref 6–23)
BUN: 28 mg/dL — ABNORMAL HIGH (ref 6–23)
Calcium: 9.4 mg/dL (ref 8.4–10.5)
Creatinine, Ser: 1.12 mg/dL — ABNORMAL HIGH (ref 0.50–1.10)
Creatinine, Ser: 1.11 mg/dL — ABNORMAL HIGH (ref 0.50–1.10)
GFR calc Af Amer: 58 mL/min — ABNORMAL LOW (ref >90)
GFR calc Af Amer: 59 mL/min — ABNORMAL LOW (ref >90)
GFR calc non Af Amer: 50 mL/min — ABNORMAL LOW (ref >90)

## 2012-10-19 LAB — GLUCOSE, CAPILLARY: Glucose-Capillary: 162 mg/dL — ABNORMAL HIGH (ref 70–99)

## 2012-10-19 LAB — NA AND K (SODIUM & POTASSIUM), RAND UR
Potassium Urine: 54 mEq/L
Sodium, Ur: 88 mEq/L

## 2012-10-19 LAB — PHOSPHORUS: Phosphorus: 3.4 mg/dL (ref 2.3–4.6)

## 2012-10-19 LAB — URINE MICROSCOPIC-ADD ON

## 2012-10-19 LAB — CHLORIDE, URINE, RANDOM: Chloride Urine: 94 mEq/L

## 2012-10-19 MED ORDER — SODIUM CHLORIDE 0.9 % IV SOLN: INTRAVENOUS | Status: AC

## 2012-10-19 MED ORDER — SODIUM CHLORIDE 0.9 % IV BOLUS (SEPSIS)
500.0000 mL | Freq: Once | INTRAVENOUS | Status: AC
  Administered 2012-10-19: 500 mL via INTRAVENOUS

## 2012-10-19 MED ORDER — ALBUTEROL SULFATE (5 MG/ML) 0.5% IN NEBU: 2.5000 mg | INHALATION_SOLUTION | RESPIRATORY_TRACT | Status: DC | PRN

## 2012-10-19 MED ORDER — POTASSIUM CHLORIDE CRYS ER 20 MEQ PO TBCR
EXTENDED_RELEASE_TABLET | ORAL | Status: AC
  Administered 2012-10-19: 08:00:00
  Filled 2012-10-19: qty 2

## 2012-10-19 MED ORDER — INSULIN ASPART 100 UNIT/ML ~~LOC~~ SOLN: 4.0000 [IU] | Freq: Three times a day (TID) | SUBCUTANEOUS | Status: DC

## 2012-10-19 MED ORDER — POTASSIUM CHLORIDE CRYS ER 20 MEQ PO TBCR
40.0000 meq | EXTENDED_RELEASE_TABLET | Freq: Two times a day (BID) | ORAL | Status: AC
  Administered 2012-10-19 (×2): 40 meq via ORAL

## 2012-10-19 MED ORDER — INSULIN GLARGINE 100 UNIT/ML ~~LOC~~ SOLN
12.0000 [IU] | Freq: Every day | SUBCUTANEOUS | Status: DC
  Administered 2012-10-20 – 2012-10-27 (×8): 12 [IU] via SUBCUTANEOUS
  Filled 2012-10-19 (×10): qty 0.12

## 2012-10-19 MED ORDER — INSULIN ASPART 100 UNIT/ML ~~LOC~~ SOLN
4.0000 [IU] | Freq: Three times a day (TID) | SUBCUTANEOUS | Status: DC
  Administered 2012-10-19 – 2012-10-23 (×8): 4 [IU] via SUBCUTANEOUS

## 2012-10-19 MED ORDER — INSULIN ASPART 100 UNIT/ML ~~LOC~~ SOLN: 0.0000 [IU] | Freq: Every day | SUBCUTANEOUS | Status: DC

## 2012-10-19 MED ORDER — SPIRONOLACTONE 50 MG PO TABS
50.0000 mg | ORAL_TABLET | Freq: Two times a day (BID) | ORAL | Status: DC
  Administered 2012-10-19 (×2): 50 mg via ORAL
  Filled 2012-10-19 (×3): qty 1

## 2012-10-19 MED ORDER — MORPHINE SULFATE ER 30 MG PO TBCR: 60.0000 mg | EXTENDED_RELEASE_TABLET | Freq: Every day | ORAL | Status: DC | PRN

## 2012-10-19 MED ORDER — INSULIN ASPART 100 UNIT/ML ~~LOC~~ SOLN
0.0000 [IU] | Freq: Three times a day (TID) | SUBCUTANEOUS | Status: DC
  Administered 2012-10-20 – 2012-10-27 (×11): 2 [IU] via SUBCUTANEOUS
  Administered 2012-10-28: 3 [IU] via SUBCUTANEOUS

## 2012-10-19 MED ORDER — POTASSIUM CHLORIDE CRYS ER 20 MEQ PO TBCR
40.0000 meq | EXTENDED_RELEASE_TABLET | ORAL | Status: AC
  Administered 2012-10-19: 40 meq via ORAL

## 2012-10-19 MED ORDER — POTASSIUM CHLORIDE 10 MEQ/100ML IV SOLN
10.0000 meq | INTRAVENOUS | Status: AC
  Administered 2012-10-19 – 2012-10-20 (×6): 10 meq via INTRAVENOUS
  Filled 2012-10-19: qty 500
  Filled 2012-10-19: qty 100

## 2012-10-19 NOTE — Consult Note (Signed)
PULMONARY  / CRITICAL CARE MEDICINE  Name: [name] MRN: [id number] DOB: 1945/08/26    ADMISSION DATE:  10/13/2012 CONSULTATION DATE:  10/19/2012  REFERRING MD :  [name] PRIMARY SERVICE:  PCCM  CHIEF COMPLAINT:  Acute encephalopathy  BRIEF PATIENT DESCRIPTION: 67 yo with past medical history of severe AS, diastolic CHF, COPD (on 2 L oxygen) and recent admission for RLE MSSA cellulitis (on Ancef) admitted on 8/20 with increased oxygen requirements and dyspnea.  Agressively diuresed. On 8/26 moved to SDU due to acute encephalopathy and multiple metabolic disturbances. Of note, husband reports frequent swings in level of consciousness since about 6 months ago.  PCCM was consulted, assumed care.  SIGNIFICANT EVENTS / STUDIES:  8/26  CT abdomen / pelvis >>>  LINES / TUBES: PICC preadmission >>> Foley 8/26 >>>  CULTURES:  ANTIBIOTICS: Ancef preadmission >>>  The patient is encephalopathic and unable to provide history, which was obtained for available medical records.  HISTORY OF PRESENT ILLNESS:  67 yo with past medical history of severe AS, diastolic CHF, COPD (on 2 L oxygen) and recent admission for RLE MSSA cellulitis (on Ancef) admitted on 8/20 with increased oxygen requirements and dyspnea.  Aggressively diuresed. On 8/26 moved to SDU due to acute encephalopathy and multiple metabolic disturbances. Of note, husband reports frequent swings in level of consciousness since about 6 months ago.  PCCM was consulted, assumed care.  PAST MEDICAL HISTORY :  Past Medical History  Diagnosis Date  . Carotid bruit     0-39% bilateral ICA by dopplers 03/2010  . Aortic stenosis     a. Moderate echo 01/2009, then severe 03/2010 --> last echo 09/2011:  EF 60-65%, grade 2 diastolic dysfunction, severe aortic stenosis, mean gradient 50, mild MR    b. Not an operative candidate due to comorbidities, seen in TAVR clinic 02/2012 but pt deferred further testing. c. low risk myovue 11/2007 breast  attenuation EF 63%  . Venous insufficiency   . Arthritis   . Depression   . COPD (chronic obstructive pulmonary disease)   . Hypothyroidism   . Thrombocytopenia   . Hypokalemia   . Edema   . Respiratory failure     Ventilator-dependent respiratory failure secondary to hypercarbia, cardiac arrest, presumed penumonia 12/2008  . Cardiac arrest     12/2010 in setting of respiratory failure with less than 15 minutes of CPR; initial rhythm was asystole  . PVD (peripheral vascular disease)     Eval by Dr. Excell Seltzer 2012: noninvasive immaging suggested significant aortoiliac disease, ABIs in mild range  - med rx given significant comorbidities  . H/O endoscopy     For ?ampullary mass - had EGD 07/2010 with no evidence of ampullary mass, some CBD dilitation   . Chronic pain   . Lumbar stenosis     Admitted 09/2011 - not felt to be a good candidate for surgical intervention.  . CHF (congestive heart failure)   . Morbid obesity 10/27/2011  . Chronic diastolic CHF (congestive heart failure) 10/27/2011  . Lower extremity weakness 10/26/2011  . Spinal stenosis of lumbar region 10/25/2011  . Diabetes mellitus   . Hypertension   . Bacteremia     a. Adm 08/2012 with MSSA bacteremia, R leg cellulitis. TTE no endocarditis. TEE deferred. 6 weeks abx.  . Yeast infection     a. Abdominal.  . Cirrhosis     a. By CT 09/2012.  Marland Kitchen Hepatosplenomegaly     a. By CT 09/2012.   Past Surgical  History  Procedure Laterality Date  . Thyroidectomy    . Laparoscopic cholecystectomy    . Lithotripsy       for nephrolithiasis  . Vaginal hysterectomy       for menorrhagia  at age 13  . Breast cyst excision    . Rotator cuff repair    . Cesarean section    . Laminectomy     Prior to Admission medications   Medication Sig Start Date End Date Taking? Authorizing Provider  acetaminophen (TYLENOL) 325 MG tablet Take 650 mg by mouth every 6 (six) hours as needed (headache).   Yes Historical Provider, MD  ALPRAZolam Prudy Feeler) 1  MG tablet Take 1 mg by mouth 3 (three) times daily as needed for anxiety.  02/04/12  Yes Historical Provider, MD  aspirin 325 MG tablet Take 1 tablet (325 mg total) by mouth daily. 12/14/11  Yes Joseph Art, DO  ceFAZolin (ANCEF) 2-3 GM-% SOLR Inject 50 mLs (2 g total) into the vein every 8 (eight) hours. X 6 weeks 09/23/12  Yes Ripudeep Jenna Luo, MD  famotidine (PEPCID) 20 MG tablet Take 1 tablet (20 mg total) by mouth daily. For indigestion 11/07/11 11/06/12 Yes Evlyn Kanner Love, PA-C  fluconazole (DIFLUCAN) 100 MG tablet Take 100 mg by mouth daily. 10/12/12  Yes Cliffton Asters, MD  FLUoxetine (PROZAC) 20 MG capsule Take 20 mg by mouth daily.    Yes Historical Provider, MD  furosemide (LASIX) 80 MG tablet Take 80 mg by mouth 2 (two) times daily. NOTE: Patient increased dose from 80mg  AM/40mg  PM to 80mg  twice daily.   Yes Historical Provider, MD  hydrocerin (EUCERIN) CREA Apply 1 application topically daily. 09/23/12  Yes Ripudeep Jenna Luo, MD  levothyroxine (SYNTHROID, LEVOTHROID) 137 MCG tablet Take 137 mcg by mouth daily.   Yes Historical Provider, MD  metFORMIN (GLUCOPHAGE) 1000 MG tablet Take 1,000 mg by mouth 2 (two) times daily with a meal.   Yes Historical Provider, MD  metoprolol succinate (TOPROL-XL) 25 MG 24 hr tablet Take 12.5 mg by mouth daily.    Yes Historical Provider, MD  morphine (MS CONTIN) 60 MG 12 hr tablet Take 60 mg by mouth every 12 (twelve) hours.    Yes Historical Provider, MD  morphine (MSIR) 15 MG tablet Take 15 mg by mouth every 4 (four) hours as needed. pain   Yes Historical Provider, MD  potassium chloride (K-DUR,KLOR-CON) 10 MEQ tablet Take 20 mEq by mouth 2 (two) times daily.    Yes Historical Provider, MD  senna-docusate (SENOKOT-S) 8.6-50 MG per tablet Take 4 tablets by mouth daily. For constipation. 11/07/11 11/06/12 Yes Evlyn Kanner Love, PA-C  sodium chloride 0.9 % injection 10-40 mLs by Intracatheter route as needed (flush). 09/23/12  Yes Ripudeep Jenna Luo, MD  spironolactone  (ALDACTONE) 25 MG tablet Take 25 mg by mouth daily.   Yes Historical Provider, MD  lactulose (CHRONULAC) 10 GM/15ML solution Take 15 mLs (10 g total) by mouth 2 (two) times daily as needed (constipation). 12/14/11   Joseph Art, DO   Allergies  Allergen Reactions  . Latex     REACTION: itch  . Penicillins     REACTION: hives - tolerates Ancef   FAMILY HISTORY:  Family History  Problem Relation Age of Onset  . Emphysema Father   . Allergies Sister   . Asthma Sister   . Heart disease Father   . Cancer Mother    SOCIAL HISTORY:  reports that she has been smoking  Cigarettes.  She has a 90 pack-year smoking history. She uses smokeless tobacco. She reports that she does not drink alcohol or use illicit drugs.  REVIEW OF SYSTEMS:  Unable to provide.  INTERVAL HISTORY:  VITAL SIGNS: Temp:  [97.6 F (36.4 C)-98.3 F (36.8 C)] 97.6 F (36.4 C) (08/26 1402) Pulse Rate:  [88-101] 94 (08/26 1402) Resp:  [18-20] 20 (08/26 1402) BP: (120-141)/(46-55) 123/52 mmHg (08/26 1402) SpO2:  [96 %-100 %] 96 % (08/26 1820) FiO2 (%):  [40 %] 40 % (08/26 1820) Weight:  [111.902 kg (246 lb 11.2 oz)] 111.902 kg (246 lb 11.2 oz) (08/26 0541)  HEMODYNAMICS:   VENTILATOR SETTINGS: Vent Mode:  [-]  FiO2 (%):  [40 %] 40 %  INTAKE / OUTPUT: Intake/Output     08/26 0701 - 08/27 0700   P.O. 240   Total Intake(mL/kg) 240 (2.1)   Urine (mL/kg/hr) 1500 (1.1)   Total Output 1500   Net -1260       Urine Occurrence 3 x     PHYSICAL EXAMINATION: General:  Appears chronically ill Neuro:  Obtunded but arouses to stimulation, gag/cough intact HEENT:  PERRL, dry membranes Cardiovascular:  RRR, no m/r/g Lungs:  Bilateral diminished air entry, no w/r/r Abdomen:  Obese, distended, significant tenderness and paraumbilical area, bowel sounds present. Musculoskeletal:  Moves all extremities, chronic stasis dermatitis LE BL Skin:  Erythema LLE, surgical dressing RLE   LABS:  Recent Labs Lab  10/13/12 1419 10/13/12 2123 10/14/12 0045 10/14/12 0454  10/14/12 1158  10/18/12 0523 10/18/12 1205 10/18/12 1845 10/19/12 0445 10/19/12 1300 10/19/12 1710 10/19/12 1755  HGB 10.2* 11.1*  --   --   --   --   --  12.3  --   --   --   --   --   --   WBC 5.2 6.2  --   --   --   --   --  7.9  --   --   --   --   --   --   PLT 138* 171  --   --   --   --   --  162  --   --   --   --   --   --   NA 139  --   --   --   < >  --   < > 132* 131* 129* 133* 132*  --   --   K 3.8  --   --   --   < >  --   < > 3.1* 3.2* 3.0* 2.6* 2.8*  --   --   CL 100  --   --   --   < >  --   < > 81* 80* 80* 82* 82*  --   --   CO2 34*  --   --   --   < >  --   < > 44* 44* 42* 44* 43*  --   --   GLUCOSE 104*  --   --   --   < >  --   < > 146* 238* 236* 152* 202*  --   --   BUN 13  --   --   --   < >  --   < > 26* 27* 27* 28* 28*  --   --   CREATININE 0.73 0.70  --   --   < >  --   < >  1.14* 1.28* 1.23* 1.12* 1.11*  --   --   CALCIUM 8.5  --   --   --   < >  --   < > 9.3 9.3 9.2 9.4 9.4  --   --   MG  --  1.9  --   --   < >  --   < > 2.4  --  2.3 2.4  --   --  2.5  PHOS  --   --   --   --   --   --   --   --   --   --   --  3.4  --   --   AST 43*  --   --   --   --   --   --   --   --   --   --   --   --   --   ALT <5  --   --   --   --   --   --   --   --   --   --   --   --   --   ALKPHOS 144*  --   --   --   --   --   --   --   --   --   --   --   --   --   BILITOT 0.7  --   --   --   --   --   --   --   --   --   --   --   --   --   PROT 7.0  --   --   --   --   --   --   --   --   --   --   --   --   --   ALBUMIN 2.5*  --   --   --   --   --   --   --   --   --   --   --   --   --   APTT  --   --   --   --   --   --   --   --  41*  --   --   --   --   --   INR  --   --   --   --   --   --   --   --  1.23  --   --   --   --   --   LATICACIDVEN  --   --   --   --   --   --   --   --   --  2.6*  --   --   --   --   TROPONINI  --   --  <0.30 <0.30  --  <0.30  --   --   --   --   --   --   --   --   PROBNP  694.1*  --   --   --   --   --   --   --   --   --   --   --   --   --   PHART  --   --   --   --   --   --   --   --   --   --   --   --  7.470*  --   PCO2ART  --   --   --   --   --   --   --   --   --   --   --   --  58.5*  --   PO2ART  --   --   --   --   --   --   --   --   --   --   --   --  56.6*  --   < > = values in this interval not displayed.  Recent Labs Lab 10/18/12 1217 10/18/12 2113 10/19/12 0647 10/19/12 1138 10/19/12 1612  GLUCAP 191* 180* 144* 195* 162*   CXR:  8/26 >>> No overt airspace disease, small R effusion, hardware in good position  ASSESSMENT / PLAN:  PULMONARY A:  COPD without evidence of exacerbation.  Chronic hypercarbic / hypoxemic respiratory failure (CO2 at baseline).  P:   Gaol SpO2>92 Supplemental oxygen PRN Added Albuterol PRN  CARDIOVASCULAR A: Chronic diastolic CHF, exacerbation no resolved.  Severe IS.  Borderline elevated lactate. P:  Goal MAP>60 Cardiology following Recheck lactate  RENAL A:  Acute renal failure (prerenal?).  Contraction alkalosis.  Hypovolemia.  Hypokalemia. P:  D/c Lasix / Aldactone  Trend BMP Continue K 40 for 2 more doses Start NS@75   GASTROINTESTINAL A:  Abdominal pain.  History of hernia. P:   GI Px is not indicated Abdomen / pelvis CT  HEMATOLOGIC A:  No active issues. P:  Trend CBC Heparin for DVT Px  INFECTIOUS A:  LE cellulitis. P:   Continue Ancef as preadmission  ENDOCRINE  A:  DM.  Hyperglycemia.  Hypothyroidism. P:   Meals coverage + basal coverage Add SSI  Preadmission Synthroid  NEUROLOGIC A:  Encephalopathy (acute on chronic).  Appeared to be on high doses opioids / benzodiazepines preadmission. No evidence of withdrawal at this time. P:   Continue to hold Xanax, MS Contin, MS IR Continue Prozac  I have personally obtained a history, examined the patient, evaluated laboratory and imaging results, formulated the assessment and plan and placed orders.  Lonia Farber, MD Pulmonary and Critical Care Medicine [facility] Pager: [phone]  10/19/2012, 7:44 PM

## 2012-10-19 NOTE — Significant Event (Signed)
Rapid Response Event Note  Overview: Time Called: 1800 Arrival Time: 1803 Event Type: Respiratory  Initial Focused Assessment: Patient arousable but falls asleepy rapidly.  Lung sounds clear decreased bases.  Heart tones, murmur ABG done prior to my arrival because patient's lethargy during the day.  Interventions: Dr Dierdre Searles initially requested patient placed on Bipap, but per MD patient PCO2 at baseline.  Place patient on venturi mask, and titrate to keep O2 sat 92-96%. Transferred patient to 2924 via bed with O2 and heart monitor.  Event Summary: Name of Physician Notified: Dr Dierdre Searles at bedside at      at    Outcome: Transferred (Comment) (TCU)  Event End Time: 1900  Marcellina Millin

## 2012-10-19 NOTE — Progress Notes (Signed)
The patient was incontinent overnight and accurate Is and Os could not be recorded.  Otherwise, she did not have any acute changes.

## 2012-10-19 NOTE — Progress Notes (Signed)
Foley huddle complete with charge rn holly,   Inserted 1f foley catheter using sterile technique.  Pt tolerated well.

## 2012-10-19 NOTE — Progress Notes (Signed)
Name: JARI CAROLLO MRN: [id number] DOB: 1945-11-12  ELECTRONIC ICU PHYSICIAN NOTE  Problem:  Very low mean bps and remains lethargic  Intervention:  Add back ns 500 cc now  Sandrea Hughs 10/19/2012, 9:52 PM

## 2012-10-19 NOTE — Progress Notes (Signed)
CRITICAL VALUE ALERT  Critical value received:  2. 6 K+ and 44 CO2  Date of notification:  10/19/2012  Time of notification:  0620  Critical value read back:  yes  Nurse who received alert:  Lindajo Royal RN  MD notified (1st page):  IMTS  Time of first page:  0625   Responding MD:  IMTS  Time MD responded:  0630

## 2012-10-19 NOTE — Progress Notes (Signed)
  Date: 10/19/2012  Patient name: [name]  Medical record number: [id number]  Date of birth: [birth year]   This patient has been seen and the plan of care was discussed with the house staff. Please see their note for complete details. I concur with their findings with the following additions/corrections:  Called re: Dr. Dierdre Searles concerning acute changes in Ms. Lesure's status.  RRT called.  Transferred to SDU.  Have requested that the team hold all sedating medications.  Dr. Dierdre Searles reported that she will be contacting Heart Failure team re: change in status.   Inez Catalina, MD 10/19/2012, 8:21 PM

## 2012-10-19 NOTE — Progress Notes (Signed)
Name: [name] MRN: [id number] DOB: 09/15/1945  ELECTRONIC ICU PHYSICIAN NOTE  Problem:  Hypokalemia, too somnolent for po  Intervention:  KCl 10 meq per hour x 6 hours   Sandrea Hughs 10/19/2012, 10:19 PM

## 2012-10-19 NOTE — Progress Notes (Addendum)
Subjective:  [name] seen and examined in AM. [name] states she does not feel good. She is more drowsy today. She does not report feeling more shortness of breath. Currently on 4L oxygen. Still with muscle cramping.  No CP, palpitations, or leg pain. Reports have 2-3 episodes of incontinence last night but able to get to commode to urinate. No burning or pain on urination. No more vaginal bleeding.       Objective: Vital signs in last 24 hours: Filed Vitals:   10/19/12 0541 10/19/12 0542 10/19/12 0543 10/19/12 0546  BP: 141/55 136/54 125/50 130/49  Pulse: 89 91 88 90  Temp: 98.1 [name] (36.7 C)     TempSrc: Oral     Resp: 20 20 18 18   Height:      Weight: 111.902 kg (246 lb 11.2 oz)     SpO2: 96%      Weight change: -0.907 kg (-2 lb)  Intake/Output Summary (Last 24 hours) at 10/19/12 0734 Last data filed at 10/18/12 2230  Gross per 24 hour  Intake    680 ml  Output   3125 ml  Net  -2445 ml   Head: Normocephalic and atraumatic.  Mouth/Throat: Oropharynx is clear and moist.  Eyes: Conjunctivae and EOM are normal. Pupils are equal, round, and reactive to light.  Neck: Normal range of motion. Neck supple. No JVD  Cardiovascular: Normal rate, regular rhythm and intact distal pulses. Systolic III/VI murmur, 2+ pitting edema to the knees  Pulmonary/Chest: Effort normal. No respiratory distress. She has no wheezes. Coarse sounds at bases.  She exhibits no tenderness. Breathing on 4L [place] Abdominal: Soft. Bowel sounds are normal. She exhibits no distension. There is no tenderness.  Musculoskeletal: Normal range of motion. She exhibits no tenderness Neurological: She is alert and oriented to person, place, and time.  Skin: Skin is warm and dry. She is not diaphoretic. RLE wrapped   PICC line intact with no surrounding erythema   Lab Results: Basic Metabolic Panel:  Recent Labs Lab 10/18/12 1845 10/19/12 0445  NA 129* 133*  K 3.0* 2.6*  CL 80* 82*  CO2 42* 44*  GLUCOSE 236* 152*  BUN  27* 28*  CREATININE 1.23* 1.12*  CALCIUM 9.2 9.4  MG 2.3 2.4   Liver Function Tests:  Recent Labs Lab 10/13/12 1419  AST 43*  ALT <5  ALKPHOS 144*  BILITOT 0.7  PROT 7.0  ALBUMIN 2.5*   No results found for this basename: LIPASE, AMYLASE,  in the last 168 hours No results found for this basename: AMMONIA,  in the last 168 hours CBC:  Recent Labs Lab 10/13/12 1419 10/13/12 2123 10/18/12 0523  WBC 5.2 6.2 7.9  NEUTROABS 3.3  --   --   HGB 10.2* 11.1* 12.3  HCT [name].4* 34.8* 37.6  MCV 91.5 91.1 91.0  PLT 138* 171 162   Cardiac Enzymes:  Recent Labs Lab 10/14/12 0045 10/14/12 0454 10/14/12 1158  TROPONINI <0.30 <0.30 <0.30   BNP:  Recent Labs Lab 10/13/12 1419  PROBNP 694.1*   D-Dimer:  Recent Labs Lab 10/13/12 1419  DDIMER 2.00*   CBG:  Recent Labs Lab 10/17/12 1632 10/17/12 2043 10/18/12 0633 10/18/12 1217 10/18/12 2113 10/19/12 0647  GLUCAP 172* 227* 137* 191* 180* 144*   Hemoglobin A1C:  Recent Labs Lab 10/14/12 0454  HGBA1C 6.7*   Fasting Lipid Panel: No results found for this basename: CHOL, HDL, LDLCALC, TRIG, CHOLHDL, LDLDIRECT,  in the last 168 hours Thyroid Function  Tests:  Recent Labs Lab 10/14/12 0454  TSH 1.202   Coagulation:  Recent Labs Lab 10/18/12 1205  LABPROT 15.2  INR 1.23   Anemia Panel:  Recent Labs Lab 10/18/12 0523  VITAMINB12 718  FOLATE 17.1  FERRITIN 149  TIBC 422  IRON 50  RETICCTPCT 4.7*   Urine Drug Screen: Drugs of Abuse     Component Value Date/Time   LABOPIA NEGATIVE 01/15/2009 0117   COCAINSCRNUR NEGATIVE 01/15/2009 0117   LABBENZ  Value: POSITIVE (NOTE) Sent for confirmatory testing Result repeated and verified.* 01/15/2009 0117   AMPHETMU NEGATIVE 01/15/2009 0117    Alcohol Level: No results found for this basename: ETH,  in the last 168 hours Urinalysis:  Recent Labs Lab 10/13/12 1541 10/14/12 1326  COLORURINE YELLOW YELLOW  LABSPEC 1.010 1.014  PHURINE 6.0 7.5   GLUCOSEU NEGATIVE NEGATIVE  HGBUR NEGATIVE NEGATIVE  BILIRUBINUR NEGATIVE NEGATIVE  KETONESUR NEGATIVE NEGATIVE  PROTEINUR NEGATIVE NEGATIVE  UROBILINOGEN 0.2 1.0  NITRITE NEGATIVE NEGATIVE  LEUKOCYTESUR NEGATIVE NEGATIVE   Misc. Labs:   Micro Results: Recent Results (from the past 240 hour(s))  CULTURE, BLOOD (ROUTINE X 2)     Status: None   Collection Time    10/14/12 11:58 AM      Result Value Range Status   Specimen Description BLOOD LEFT ARM   Final   Special Requests BOTTLES DRAWN AEROBIC AND ANAEROBIC 10CC   Final   Culture  Setup Time     Final   Value: 10/14/2012 16:39     Performed at [facility]   Culture     Final   Value:        BLOOD CULTURE RECEIVED NO GROWTH TO DATE CULTURE WILL BE HELD FOR 5 DAYS BEFORE ISSUING A FINAL NEGATIVE REPORT     Performed at [facility]   Report Status PENDING   Incomplete  CULTURE, BLOOD (ROUTINE X 2)     Status: None   Collection Time    10/14/12 12:05 PM      Result Value Range Status   Specimen Description BLOOD LEFT HAND   Final   Special Requests BOTTLES DRAWN AEROBIC AND ANAEROBIC 10CC   Final   Culture  Setup Time     Final   Value: 10/14/2012 16:39     Performed at [facility]   Culture     Final   Value:        BLOOD CULTURE RECEIVED NO GROWTH TO DATE CULTURE WILL BE HELD FOR 5 DAYS BEFORE ISSUING A FINAL NEGATIVE REPORT     Performed at [facility]   Report Status PENDING   Incomplete   Studies/Results: No results found. Medications: I have reviewed the patient's current medications. Scheduled Meds: . acetaZOLAMIDE  500 mg Oral BID  . ceFAZolin  2 g Intravenous Q8H  . fluconazole  100 mg Oral Daily  . FLUoxetine  20 mg Oral Daily  . heparin  5,000 Units Subcutaneous Q8H  . hydrocerin  1 application Topical Daily  . insulin aspart  4 Units Subcutaneous TID WC  . insulin glargine  12 Units Subcutaneous QHS  . levothyroxine  137 mcg Oral QAC breakfast  . metolazone  2.5 mg  Oral BID  . morphine  60 mg Oral Q12H  . potassium chloride SA      . potassium chloride SA  40 mEq Oral TID  . sodium chloride  10-40 mL Intracatheter Q12H  . sodium chloride  3  mL Intravenous Q12H  . spironolactone  25 mg Oral BID   Continuous Infusions: . furosemide (LASIX) infusion 10 mg/hr (10/18/12 1640)   PRN Meds:.sodium chloride, lactulose, ondansetron (ZOFRAN) IV, sodium chloride, sodium chloride Assessment/Plan: Principal Problem:   Acute exacerbation of CHF (congestive heart failure) Active Problems:   Type I (juvenile type) diabetes mellitus with peripheral circulatory disorders, not stated as uncontrolled(250.71)  Assessment & Plan by Problem:  [name] is a 67 y.o. female PMH moderate aortic stenosis, diastolic CHF (EF 40-98%), COPD (on 2L home O2), chronic venous insufficiency, recent admission from 7/25-7/31/14 for RLE cellulitis complicated by MSSA bacteremia, receiving Ancef x 6 weeks through a PICC, who presents with shortness of breath and increased oxygen dependency x1 week.   #Somnolence - [name] with increasing sleepiness and lethargy for last 2 days, today worse. Possibly due to fluid overload from CHF exacerbation vs respiratory depression from alkalosis (on diamax 500mg  BID) -Obtain ABG - CO2 at baseline (58.3), but hypoxic - pO2 56.6 --> Acute hypoxic respiratory failure -Obtain CXR -  No convincing pulmonary edema. Trace right pleural effusion with right basilar atelectasis. No segmental infiltrate -Hold MS contin  -Lactic acid 2.6, on 7/26: 2.4     # Hypokalemia - most likely due to  RTA Type 2  induced by diamax - low bicarb (12-20) characteristic but in setting of diuretic use with contraction alkalosis and chronic hypercapnia COPD is high. Normal magnesium levels.   -Monitor K TID, replacement  TID -Received 5  doses of PO K since last night 8/25   - Per heart failure team - to stop metalozone, spironolactone 50mg  BID, continue diamax 500mg   BID   -Obtain Phos (3.4), urinary Na (88) , K (54), Cl (94)   -Obtain UA - pH 7.5 - no glucosuria, proteinuria    #Acute on Chronic Multifactorial Diastolic CHF exacerbation - Acute on chronic diastolic heart failure is the most likely cause of dyspnea in this patient with a 20-30lb weight gain in ~3 weeks, signs of fluid overload on exam including crackles and LE edema, CXR showing pulmonary interstitial edema, and an increased pro-BNP=694. Echo on 09/19/12 showed moderate LVH, hyperdynamic systolic function with EF 70-75%, no wall motion abnormalities, moderate aortic stenosis 2/2 calcified annulus, not candidate of AVR.  - Monitor on telemetry  -  IV lasix 10mg /hr, spironolactone 50mg  BID DISCONTINUE 2.5mg  BID metolazone   -On diamox for alkalosis, if increased respiratory depression due to alkalosis  may need to stop diuretics - Candidate for AVR or TAVR if adequate weight loss - Monitor BMP, Mg BID - Continue O2 via [place]. Currently on 4, wean as tolerated. Goal 92-96%.  - Daily weights -  2lb wt loss since yesterday, 272 to 246lb since admission   - Strict I/Os - 3L loss since yesterday, foley no longer in place - Cardiologist Dr. Eden Emms     Orthostatic Hypotension - most likely due to hypovolemia due to diuresis  - Orthostatic VS - normal   - unable to apply TED due to significant LE swelling and erythemas.  - Per Cardiology - hold metoprolol for now   #Metabolic Alkalosis - contraction alkalosis due to diuretic use vs chronic hypoxic hypercapnia due to COPD - Monitor bicarb - currently on diamox since 8/24 -Monitor for respiratory depression   #Vaginal bleeding - resolved, possibly blood from foley catheter, however physical examination revealed mild blood in vaginal vault. She is s/p hysterectomy.  -Consulted Dr. Erin Fulling over phone  who suggested that due to no current active bleeding and difficulty in examination in hospital setting and resolution of bleeding to follow-up as  outpatient with her or with her OBGYN  -[name] normal, PTT prolonged (past history of prolonged PTT) -UA 8/26 - no blood, small LE   #RLE cellulitis with MSSA bacteremia - Improving. On 6 weeks of IV abx to cover for endocarditis.  - Continue home Ancef via PICC, started 7/31 for 6 weeks, until 8/31 - Continue home MS Contin bid  - Obtain blood cultures - no growth to date -Xray R foot 7/25 - diffuse soft tissue prominence suspicious for cellulitis  -Right LE Duplex US 7/27 - no evidence of DVT, SVT,  or baker's cyst -LE Arterial Duplex 07/19/2010 - moderate ABI, aortic iliac disease -Wound care consult - 8/22  No open wound visible.  Applied foam dressing and secured with kerlex to absorb drainage and protect from further injury. -[name] consult - recommend home health [name] and wide 3n1   #DM2 - controlled, on metformin at home  - HbA1C - 6.7  -CBG AC and QHS   -Lantus 12U at bedtime , Novolog 4U -Nutrition consult - Obese Class III - no further nutrition intervention    #COPD on home O2 2L - PFTs 03/01/2012 shows moderate obstructive lung defect (FEV1/FVC 63%, RV 126%) and severely decreased diffusion capacity (DLCO 49%), no response to bronchodilators. On home oxygen. Unknown baseline O2 sat.  - Goal O2 sat 92-96%  - Will monitor for signs/symptoms of hypercapnia   #Hypothyroidism - S/p thyroidectomy. Last TSH 0.153 in 11/2011. 3.1cm soft tissue mass behind [place] joint possible thyroid nodule - TSH WNL  - Continue home synthroid  -will need outpatient thyroid US -lactulose as needed for constipation   Normocytic Anemia - due to chronic disease (hypothyroidism) vs iron deficiency vs thalassemia vs GI blood loss -Last normal H/H 09/22/12, uptrending  -Obtain anemia panel - iron, ferritin, TIBC, vit B12, folate normal, iron sat low -Blood smear with polychromasia  Thrombocytopenia -chronic since l10/19/13. Suspect due to splenomegaly.  In 2012 [place] abd with normal splenomegaly. On  10/13/12 CT  chest, slight splenomegaly. Other possibilties include medication induced, ITP, HIT syndrome, pseuothrombocytopenia, and bone marrow suppression     - <100K since 11/2011, slowly uptrending -monitor for bleeding     #Elevated d-dimer - Her Wells' score is 3.5, earning points for clinical signs of DVT (asymmetric LE edema, erythema), PE being a likely diagnosis, and immobilization for at least 3 days. This places her in a high risk group with 40.6% chance of PE in an ED population. We will therefore proceed to definitive imaging. Cr 0.70.  -CTA chest - No PE  -CT findings: Small loculated R pleural effusion, probable PAH, hepatosplenomegaly, nodulary of the liver parenchyma c/w cirrhosis  #Chest tightness -resolved,  x3weeks, improved after receiving Lasix in the ED. Likely 2/2 dyspnea and pulmonary edema. EKG WNL, but important to rule out coronary ischemia in this patient with significant co-morbidities including obesity, diabetes, heart failure.  - Trending troponins q6h x3  - neg  #History of hospitalization for acute encephalopathy - See EPIC notes from 10/17-10/20/13. LFTs stable. No AMS  currently.  - Continuing home lactulose   #Depression - Stable.  - Continue prozac   #Diet - Heart healthy   #DVT PPX - subq Heparin    ADDENDUM:   S: Patient seen at 6:00PM due to critical ABG results performed at 5:10. [name] with increasing sleepiness and lethargy  today per nurse and husband. [name] states she does not feel good. She reports she is sleep and feels like she is going to pass out.   O: General: [name] drowsy and asleep but arousable      Lungs: Clear to auscultation bilaterally, decreased at bases R>L  , currently on 5L , sat 92-94%      Heart: Regular rhythm, systolic murmur unchanged      Abdomen - soft, non-tender, normal BS      Extremities - leg swelling unchanged from previous    A: [name] with AS currently undergoing diuresis therapy for CHF exacerbation and on diamax therapy  with hypokalemia with increasing lethargy for past 2 days, today worse with suspected acute hypoxic respiratory failure indicated by ABG (pO2 56.6) most likely due to bicarbonate excess from diuretic therapy. Chronic hypercapnia due to COPD that is at baseline. Per husband with lethargy for past 6 months. Chest X-ray without pulmonary edema or infiltrates to suggest worsening heart failure.     Plan: Dr. Dierdre Searles on consultation with Dr.  Gala Romney, Dr. Sherene Sires  (PCCM), on-call PA  Marvis Repress) to : -Transfer to Stepdown - Oxygen to keep sat >92%  - STOP lasix, STOP diamax due to worsening hypokalemia  -Continue spironolactone 50mg  BID  - IV fluids 39mL/hr   -Insert foley catheter -D/c MS contin    Dispo: Disposition is deferred at this time, awaiting improvement of current medical problems.  Anticipated discharge in approximately 3 day(s).   The patient does have a current PCP Andrey Campanile Elizebeth Koller, MD) and does need an [facility] hospital follow-up appointment after discharge.  The patient does have transportation limitations that hinder transportation to clinic appointments.  .Services Needed at time of discharge: Y = Yes, Blank = No [name]:   OT:   RN:   Equipment:   Other:     LOS: 6 days   Otis Brace, MD 10/19/2012, 7:34 AM

## 2012-10-19 NOTE — Progress Notes (Signed)
Placed Pt on  VM to keep Sp02 > 93% per MD order. MD refused Bipap. MD at bedside.

## 2012-10-19 NOTE — Progress Notes (Signed)
Primary Physician: [name], MD Primary Cardiologist: Eden Emms. Also seen 02/2012 by Cooper/Owen in TAVR clinic  Chief Complaint: SOB, a/c LEE,  weight gain 20-30lb   HF Rounding NOTE    HPI: [name] is a 67 y/o F with morbid obesity, moderate to severe AS, spinal stenosis (uses mostly wheelchair), COPD on home O2, and chronic pain requiring narcotics admitted with recurrent volume overload and resp distress. Getting IV abx due to recent bacteremia.   Over the weekend she continued on Lasix drip and metolazone. . Weight 272->246. Baseline 230-240.   Complains of dysnea with exertion. Denies PND.     Inpatient Medications:  . acetaZOLAMIDE  500 mg Oral BID  . ceFAZolin  2 g Intravenous Q8H  . fluconazole  100 mg Oral Daily  . FLUoxetine  20 mg Oral Daily  . heparin  5,000 Units Subcutaneous Q8H  . hydrocerin  1 application Topical Daily  . insulin aspart  4 Units Subcutaneous TID WC  . insulin glargine  12 Units Subcutaneous QHS  . levothyroxine  137 mcg Oral QAC breakfast  . metolazone  2.5 mg Oral BID  . morphine  60 mg Oral Q12H  . potassium chloride SA      . potassium chloride SA  40 mEq Oral TID  . sodium chloride  10-40 mL Intracatheter Q12H  . sodium chloride  3 mL Intravenous Q12H  . spironolactone  25 mg Oral BID   . furosemide (LASIX) infusion 10 mg/hr (10/18/12 1640)    Allergies:  Allergies  Allergen Reactions  . Latex     REACTION: itch  . Penicillins     REACTION: hives - tolerates Ancef    Physical Exam: Blood pressure 113/39, pulse 86, temperature 98.6 F (37 C), temperature source Oral, resp. rate 20, height 5' (1.524 m), weight 117.119 kg (258 lb 3.2 oz), SpO2 96.00%. General: Well developed, obese WF in no acute distress. Head: Normocephalic, atraumatic, sclera non-icteric, no xanthomas, nares are without discharge.  Neck: JVD difficult to see due to body habitus. Bilateral carotid bruits, question radiation from murmur. Lungs:  Diminished BS at bases and coarse otherwise. Breathing is unlabored. Heart: RRR with S1 S2. 2/6 SEM RUSB. Unable to fully auscultate S2 clearly. Abdomen: Massively obese, non-tender, non-distended with normoactive bowel sounds.  Msk:  Strength and tone appear normal for age. Extremities: No clubbing or cyanosis. Bilateral LE erythematous skin changes and significant LEE R>L with cracking of the skin. RLE is wrapped.  Neuro: Alert and oriented X 3. No facial asymmetry. No focal deficit. Moves all extremities spontaneously. Psych:  Responds to questions appropriately with a pleasant affect.   Labs: No results found for this basename: CKTOTAL, CKMB, TROPONINI,  in the last 72 hours Lab Results  Component Value Date   WBC 7.9 10/18/2012   HGB 12.3 10/18/2012   HCT 37.6 10/18/2012   MCV 91.0 10/18/2012   PLT 162 10/18/2012    Recent Labs Lab 10/13/12 1419  10/19/12 0445  NA 139  < > 133*  K 3.8  < > 2.6*  CL 100  < > 82*  CO2 34*  < > 44*  BUN 13  < > 28*  CREATININE 0.73  < > 1.12*  CALCIUM 8.5  < > 9.4  PROT 7.0  --   --   BILITOT 0.7  --   --   ALKPHOS 144*  --   --   ALT <5  --   --   AST  43*  --   --   GLUCOSE 104*  < > 152*  < > = values in this interval not displayed.  Lab Results  Component Value Date   DDIMER 2.00* 10/13/2012    Radiology/Studies:  Dg Chest 2 View  10/13/2012   CLINICAL DATA:  67 year old female with shortness of breath. Cellulitis.  EXAM: CHEST  2 VIEW  COMPARISON:  09/17/2012 and earlier.  FINDINGS: Right PICC line in place, tip at the level of the lower SVC. Small right pleural effusion new or increased since 09/17/2012. Diffuse increased interstitial opacity also progressed. No pneumothorax. No consolidation. Stable cardiomegaly and mediastinal contours. Stable visualized osseous structures.  IMPRESSION: 1.  Small right pleural effusion is new/ increased.  2. Increased interstitial markings, favor pulmonary interstitial edema. Viral/ atypical infection  is less likely.  3. Chronic cardiomegaly.  4. Right PICC line in place, tip at the lower SVC level.   Electronically Signed   By: Augusto Gamble   On: 10/13/2012 15:55    Ct Angio Chest Pe W/cm &/or Wo Cm 10/14/2012   *RADIOLOGY REPORT*  Clinical Data: Shortness of breath and cough.  Hypoxia.  CT ANGIOGRAPHY CHEST  Technique:  Multidetector CT imaging of the chest using the standard protocol during bolus administration of intravenous contrast. Multiplanar reconstructed images including MIPs were obtained and reviewed to evaluate the vascular anatomy.  Contrast: OMNIPAQUE IOHEXOL 350 MG/ML SOLN  Comparison: Chest x-ray dated 10/13/2012  Findings: There is cardiomegaly.  There is a small loculated right pleural effusion.  There is pulmonary vascular congestion of the origin of the main pulmonary arteries suggestive of pulmonary arterial hypertension.    No pulmonary emboli.   There are a few scattered lymph nodes in the mediastinum and left hilum, and none of which are pathologically enlarged.  There is a 3.1 cm soft tissue mass extending behind the right sternoclavicular joint which probably represents a thyroid nodule.  The patient appears to have had previous thyroid surgery on the right and left.  There is slight splenomegaly.  The patient has hepatomegaly with slight nodularity of the liver contour suggesting cirrhosis.  IMPRESSION: 1.  No pulmonary emboli. 2.  Cardiomegaly with right pleural effusion and pulmonary vascular congestion and probable pulmonary arterial hypertension. 3.  Hepatosplenomegaly.  Nodularity of the liver parenchyma consistent with cirrhosis.   Original Report Authenticated By: Francene Boyers, M.D.   EKG: NSR 88bpm no acute change from prior, PAC, QTc 478   Assessment and Plan:  1. 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 Mod-severe aortic stenosis. EF 70% 3. Morbid obesity 4. Spinal stenosis 5. Chronic pain 6. Severe  deconditioning 7. PAH by echo 8. COPD on home O2 9. Recent MSSA bacteremia and R leg cellulits, abx through 10/24/12 via PICC 10. Hypokalemia 11. Metabolic alkalosis  Volume status improving. Overall weight down 26 pounds.  Dry weight 230-240  punds Renal function ok. Continue lasix drip at 10 mg per hour. Supplement potassium. Give an additional  80 meq potassium this am as well as  kdur 40 meq tid. On spironolactone 25 mg bid. Check BMET at 12:30 today.   Continue diamox 500 mg bid.     CLEGG,AMY NP-C   Patient seen and examined with Tonye Becket, NP. We discussed all aspects of the encounter. I agree with the assessment and plan as stated above.   Patient see again this evening. Very lethargic. ABG ordered with metabolic alkalosis. Have discussed with  Dr. Dierdre Searles and Dr. Sherene Sires. Will move to stepdown unit. Hold lasix and metolazone. Can continue diamox if she can take po. Supp K+. Will give back some fluid. Hold narcotics. Increase spiro to 50 bid to help with hypokalemia.  Will continue to follow closely.   The patient is critically ill with multiple organ systems failure and requires high complexity decision making for assessment and support, frequent evaluation and titration of therapies, application of advanced monitoring technologies and extensive interpretation of multiple databases.   Critical Care Time devoted to patient care services described in this note is 35 Minutes.  Xiana Carns,MD 10:04 PM

## 2012-10-19 NOTE — Progress Notes (Signed)
Inpatient Diabetes Program Recommendations  AACE/ADA: New Consensus Statement on Inpatient Glycemic Control (2013)  Target Ranges:  Prepandial:   less than 140 mg/dL      Peak postprandial:   less than 180 mg/dL (1-2 hours)      Critically ill patients:  140 - 180 mg/dL  Results for [name], [name] (MRN [id number]) as of 10/19/2012 14:43  Ref. Range 10/18/2012 12:17 10/18/2012 21:13 10/19/2012 06:47 10/19/2012 11:38  Glucose-Capillary Latest Range: 70-99 mg/dL 409 (H) 811 (H) 914 (H) 195 (H)    Inpatient Diabetes Program Recommendations Correction (SSI): add sensitive correction scale TID Thank you  Piedad Climes BSN, RN,CDE Inpatient Diabetes Coordinator [phone] (team pager)

## 2012-10-20 ENCOUNTER — Inpatient Hospital Stay (HOSPITAL_COMMUNITY): Payer: Medicare Other

## 2012-10-20 DIAGNOSIS — I359 Nonrheumatic aortic valve disorder, unspecified: Secondary | ICD-10-CM

## 2012-10-20 DIAGNOSIS — T80218A Other infection due to central venous catheter, initial encounter: Secondary | ICD-10-CM

## 2012-10-20 DIAGNOSIS — Y849 Medical procedure, unspecified as the cause of abnormal reaction of the patient, or of later complication, without mention of misadventure at the time of the procedure: Secondary | ICD-10-CM

## 2012-10-20 LAB — CBC WITH DIFFERENTIAL/PLATELET
Basophils Absolute: 0 10*3/uL (ref 0.0–0.1)
Basophils Relative: 0 % (ref 0–1)
Eosinophils Absolute: 0.1 10*3/uL (ref 0.0–0.7)
Eosinophils Relative: 1 % (ref 0–5)
HCT: 36.5 % (ref 36.0–46.0)
Hemoglobin: 11.9 g/dL — ABNORMAL LOW (ref 12.0–15.0)
Lymphocytes Relative: 20 % (ref 12–46)
Lymphs Abs: 1.9 10*3/uL (ref 0.7–4.0)
MCH: 29.5 pg (ref 26.0–34.0)
MCHC: 32.6 g/dL (ref 30.0–36.0)
MCV: 90.3 fL (ref 78.0–100.0)
Monocytes Absolute: 1.1 10*3/uL — ABNORMAL HIGH (ref 0.1–1.0)
Monocytes Relative: 11 % (ref 3–12)
Neutro Abs: 6.5 10*3/uL (ref 1.7–7.7)
Neutrophils Relative %: 68 % (ref 43–77)
Platelets: 172 10*3/uL (ref 150–400)
RBC: 4.04 MIL/uL (ref 3.87–5.11)
RDW: 16.1 % — ABNORMAL HIGH (ref 11.5–15.5)
WBC: 9.6 10*3/uL (ref 4.0–10.5)

## 2012-10-20 LAB — URINALYSIS, ROUTINE W REFLEX MICROSCOPIC
Bilirubin Urine: NEGATIVE
Glucose, UA: NEGATIVE mg/dL
Ketones, ur: 15 mg/dL — AB
Nitrite: NEGATIVE
Protein, ur: NEGATIVE mg/dL
Specific Gravity, Urine: 1.017 (ref 1.005–1.030)
Urobilinogen, UA: 1 mg/dL (ref 0.0–1.0)
pH: 8.5 — ABNORMAL HIGH (ref 5.0–8.0)

## 2012-10-20 LAB — POCT I-STAT 3, ART BLOOD GAS (G3+)
O2 Saturation: 97 %
Patient temperature: 98.6
TCO2: 35 mmol/L (ref 0–100)
pCO2 arterial: 51.3 mmHg — ABNORMAL HIGH (ref 35.0–45.0)
pH, Arterial: 7.424 (ref 7.350–7.450)

## 2012-10-20 LAB — GLUCOSE, CAPILLARY
Glucose-Capillary: 147 mg/dL — ABNORMAL HIGH (ref 70–99)
Glucose-Capillary: 154 mg/dL — ABNORMAL HIGH (ref 70–99)
Glucose-Capillary: 155 mg/dL — ABNORMAL HIGH (ref 70–99)
Glucose-Capillary: 166 mg/dL — ABNORMAL HIGH (ref 70–99)

## 2012-10-20 LAB — BASIC METABOLIC PANEL
BUN: 29 mg/dL — ABNORMAL HIGH (ref 6–23)
CO2: 36 mEq/L — ABNORMAL HIGH (ref 19–32)
CO2: 27 mEq/L (ref 19–32)
Calcium: 9.4 mg/dL (ref 8.4–10.5)
Chloride: 89 mEq/L — ABNORMAL LOW (ref 96–112)
Chloride: 94 mEq/L — ABNORMAL LOW (ref 96–112)
Creatinine, Ser: 0.98 mg/dL (ref 0.50–1.10)
GFR calc Af Amer: 68 mL/min — ABNORMAL LOW (ref >90)
GFR calc non Af Amer: 59 mL/min — ABNORMAL LOW (ref >90)
Glucose, Bld: 142 mg/dL — ABNORMAL HIGH (ref 70–99)
Glucose, Bld: 172 mg/dL — ABNORMAL HIGH (ref 70–99)
Potassium: 3.8 mEq/L (ref 3.5–5.1)
Potassium: 4.1 mEq/L (ref 3.5–5.1)
Sodium: 132 mEq/L — ABNORMAL LOW (ref 135–145)
Sodium: 129 mEq/L — ABNORMAL LOW (ref 135–145)

## 2012-10-20 LAB — URINE MICROSCOPIC-ADD ON

## 2012-10-20 LAB — CULTURE, BLOOD (ROUTINE X 2)
Culture: NO GROWTH
Culture: NO GROWTH

## 2012-10-20 LAB — MAGNESIUM
Magnesium: 2.6 mg/dL — ABNORMAL HIGH (ref 1.5–2.5)
Magnesium: 2.4 mg/dL (ref 1.5–2.5)

## 2012-10-20 MED ORDER — SODIUM CHLORIDE 0.9 % IV BOLUS (SEPSIS)
500.0000 mL | Freq: Once | INTRAVENOUS | Status: AC
  Administered 2012-10-20: 500 mL via INTRAVENOUS

## 2012-10-20 MED ORDER — VANCOMYCIN HCL 10 G IV SOLR
2000.0000 mg | Freq: Once | INTRAVENOUS | Status: AC
  Administered 2012-10-20: 2000 mg via INTRAVENOUS
  Filled 2012-10-20: qty 2000

## 2012-10-20 MED ORDER — SODIUM CHLORIDE 0.9 % IV SOLN
INTRAVENOUS | Status: DC
  Administered 2012-10-20 – 2012-10-21 (×3): via INTRAVENOUS

## 2012-10-20 MED ORDER — DEXTROSE 5 % IV SOLN
1.0000 g | Freq: Three times a day (TID) | INTRAVENOUS | Status: DC
  Administered 2012-10-20 – 2012-10-21 (×4): 1 g via INTRAVENOUS
  Filled 2012-10-20 (×6): qty 1

## 2012-10-20 MED ORDER — VANCOMYCIN HCL IN DEXTROSE 1-5 GM/200ML-% IV SOLN
1000.0000 mg | Freq: Two times a day (BID) | INTRAVENOUS | Status: DC
  Administered 2012-10-20 – 2012-10-21 (×2): 1000 mg via INTRAVENOUS
  Filled 2012-10-20 (×3): qty 200

## 2012-10-20 NOTE — Progress Notes (Signed)
Pt given 2200 dose of potassium in applesauce.  10 minutes later pt vomited x2.  MD notified.  Zofran given.  Will continue to monitor.  Dara Hoyer

## 2012-10-20 NOTE — Progress Notes (Signed)
Echocardiogram 2D Echocardiogram has been performed.  Chaley Castellanos 10/20/2012, 3:21 PM

## 2012-10-20 NOTE — Progress Notes (Signed)
Primary Physician: [name], MD Primary Cardiologist: Eden Emms. Also seen 02/2012 by Cooper/Owen in [facility] clinic  Chief Complaint: SOB, a/c LEE,  weight gain 20-30lb   HF Rounding NOTE    HPI: Ms. Coiner is a 67 y/o F with morbid obesity, moderate to severe AS, spinal stenosis (uses mostly wheelchair), COPD on home O2, and chronic pain requiring narcotics admitted with recurrent volume overload and resp distress. Getting IV abx due to recent bacteremia and LE cellulitis.   She has been diuresing with lasix drip and Metolazone. Diamox added due to alkalosis. Late yesterday she was transferred to step down due to lethargy and hypotension. All diuretics stopped. Weight 272->246>236 pounds . Baseline 230-240. Over night low grade fever and tachycardic. BP OK. K improved.   Remains somnolent.  CT of abdomen - no acute findings. She has been on ancef for cellulitis. CCM on board  Complains of fatigue and leg pain.     Inpatient Medications:  . ceFAZolin  2 g Intravenous Q8H  . FLUoxetine  20 mg Oral Daily  . heparin  5,000 Units Subcutaneous Q8H  . hydrocerin  1 application Topical Daily  . insulin aspart  0-15 Units Subcutaneous TID WC  . insulin aspart  0-5 Units Subcutaneous QHS  . insulin aspart  4 Units Subcutaneous TID WC  . insulin glargine  12 Units Subcutaneous QHS  . levothyroxine  137 mcg Oral QAC breakfast  . potassium chloride SA  40 mEq Oral TID   . sodium chloride 75 mL/hr at 10/19/12 2200    Allergies:  Allergies  Allergen Reactions  . Latex     REACTION: itch  . Penicillins     REACTION: hives - tolerates Ancef    Physical Exam: Blood pressure 113/39, pulse 86, temperature 98.6 F (37 C), temperature source Oral, resp. rate 20, height 5' (1.524 m), weight 117.119 kg (258 lb 3.2 oz), SpO2 96.00%. General: Lethargic , obese WF in no acute distress. Head: Normocephalic, atraumatic, sclera non-icteric, no xanthomas, nares are without discharge.  Neck:  JVD difficult to see due to body habitus. Bilateral carotid bruits, question radiation from murmur. Lungs: Diminished BS at bases and coarse otherwise. Breathing is unlabored. On 2 liters  Heart: RRR with S1 S2. 2/6 SEM RUSB. Unable to fully auscultate S2 clearly. Abdomen: Massively obese, non-tender, non-distended with normoactive bowel sounds.  Msk:  Strength and tone appear normal for age. Extremities: No clubbing or cyanosis. Bilateral LE erythematous skin changes and significant LEE R>L with cracking of the skin. RLE is wrapped.  Neuro: Alert and oriented X 3. No facial asymmetry. No focal deficit. Moves all extremities spontaneously. Psych:  Responds to questions appropriately with a pleasant affect. GU: Foley   EKG: Sinus Tach 90-114 Labs: No results found for this basename: CKTOTAL, CKMB, TROPONINI,  in the last 72 hours Lab Results  Component Value Date   WBC 9.6 10/20/2012   HGB 11.9* 10/20/2012   HCT 36.5 10/20/2012   MCV 90.3 10/20/2012   PLT 172 10/20/2012    Recent Labs Lab 10/13/12 1419  10/20/12 0600  NA 139  < > 132*  K 3.8  < > 3.8  CL 100  < > 89*  CO2 34*  < > 36*  BUN 13  < > 29*  CREATININE 0.73  < > 0.98  CALCIUM 8.5  < > 9.4  PROT 7.0  --   --   BILITOT 0.7  --   --   ALKPHOS 144*  --   --  ALT <5  --   --   AST 43*  --   --   GLUCOSE 104*  < > 142*  < > = values in this interval not displayed.  Lab Results  Component Value Date   DDIMER 2.00* 10/13/2012    Radiology/Studies:  Dg Chest 2 View  10/13/2012   CLINICAL DATA:  67 year old female with shortness of breath. Cellulitis.  EXAM: CHEST  2 VIEW  COMPARISON:  09/17/2012 and earlier.  FINDINGS: Right PICC line in place, tip at the level of the lower SVC. Small right pleural effusion new or increased since 09/17/2012. Diffuse increased interstitial opacity also progressed. No pneumothorax. No consolidation. Stable cardiomegaly and mediastinal contours. Stable visualized osseous structures.   IMPRESSION: 1.  Small right pleural effusion is new/ increased.  2. Increased interstitial markings, favor pulmonary interstitial edema. Viral/ atypical infection is less likely.  3. Chronic cardiomegaly.  4. Right PICC line in place, tip at the lower SVC level.   Electronically Signed   By: Augusto Gamble   On: 10/13/2012 15:55    Ct Angio Chest Pe W/cm &/or Wo Cm 10/14/2012   *RADIOLOGY REPORT*  Clinical Data: Shortness of breath and cough.  Hypoxia.  CT ANGIOGRAPHY CHEST  Technique:  Multidetector CT imaging of the chest using the standard protocol during bolus administration of intravenous contrast. Multiplanar reconstructed images including MIPs were obtained and reviewed to evaluate the vascular anatomy.  Contrast: OMNIPAQUE IOHEXOL 350 MG/ML SOLN  Comparison: Chest x-ray dated 10/13/2012  Findings: There is cardiomegaly.  There is a small loculated right pleural effusion.  There is pulmonary vascular congestion of the origin of the main pulmonary arteries suggestive of pulmonary arterial hypertension.    No pulmonary emboli.   There are a few scattered lymph nodes in the mediastinum and left hilum, and none of which are pathologically enlarged.  There is a 3.1 cm soft tissue mass extending behind the right sternoclavicular joint which probably represents a thyroid nodule.  The patient appears to have had previous thyroid surgery on the right and left.  There is slight splenomegaly.  The patient has hepatomegaly with slight nodularity of the liver contour suggesting cirrhosis.  IMPRESSION: 1.  No pulmonary emboli. 2.  Cardiomegaly with right pleural effusion and pulmonary vascular congestion and probable pulmonary arterial hypertension. 3.  Hepatosplenomegaly.  Nodularity of the liver parenchyma consistent with cirrhosis.   Original Report Authenticated By: Francene Boyers, M.D.      Assessment and Plan:  1. 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  Mod-severe aortic stenosis. EF 70% 3. Morbid obesity 4. Spinal stenosis 5. Chronic pain 6. Severe deconditioning 7. PAH by echo 8. COPD on home O2 9. Recent MSSA bacteremia and R leg cellulits, abx through 10/24/12 via PICC 10. Hypokalemia 11. Metabolic alkalosis 12. Altered mental status 13. Cellulitis- on Ancef. Blood culture 09/17/12 MSSA 14. Fever   Volume status ok. Continue IV fluids. Hold all diuretics. Potassium improved with runs of potassium.   Temperature trending up. Check UA, CXR, and blood cultures.  CLEGG,AMY NP-C   Patient seen and examined with Tonye Becket, NP. We discussed all aspects of the encounter. I agree with the assessment and plan as stated above.   Alkalosis and hypokalemia much improved. Volume status improved. Remains lethargic, febrile and tachycardic concerning for early sepsis. WBC only 9.6. CXR, UA and cultures obtained. Agree with holding diuretics. CCM managing antibiotics and respiratory status.   We  will follow.   Tarina Volk,MD 8:51 AM

## 2012-10-20 NOTE — Progress Notes (Addendum)
notifed ccm Dr. Sherene Sires of pts low b/p's  /   Also notified that pt is still very lethargic.

## 2012-10-20 NOTE — Consult Note (Signed)
[facility]    Date of Admission:  10/13/2012  Date of Consult:  10/20/2012  Reason for Consult: Fever, consfusion,  Referring Physician: Dr. Shyrl Numbers   HPI: [name] is an 67 y.o. female.with morbid obesity, aortic stenosis, multifactorial heart failure, and chronic lower extremity venous stasis and  recurrent lower extremity cellulitis, admitted in July with cellulitis and found to have Staphylococcus Aureus baccteremia (MSSA). She was in the midst of being rx for this and had developed irritation at the PICC site and I had been called. She was seen in [facility] subsequent tot his by [facility] who had followed her closely as an inpatient in July on 10/11/12 who had planned to extend therapy to treat for presumptive endocarditis through September 2nd. She was admitted to the teaching service with increased shortness of breath for the past 3 weeks, especially on exertion, chest tightness. She was being aggressively diuresed but developed encephalopathy and was moved to SDU.  note, husband reports frequent swings in level of consciousness since about 6 months ago. Dr Tyson Alias was consulted from [facility], and there is concern that this encephalopathy, could be multifactorial with narcotics, CO2 retention and other metabolic issues plus concern for infection. She has had temp to above 100 has had blood cultures drawn and now PICC removed and cultured and changed to vancomycin and cefepime.      Past Medical History  Diagnosis Date  . Carotid bruit     0-39% bilateral ICA by dopplers 03/2010  . Aortic stenosis     a. Moderate echo 01/2009, then severe 03/2010 --> last echo 09/2011:  EF 60-65%, grade 2 diastolic dysfunction, severe aortic stenosis, mean gradient 50, mild MR    b. Not an operative candidate due to comorbidities, seen in [facility] clinic 02/2012 but pt deferred further testing. c. low risk myovue 11/2007 breast attenuation EF 63%  . Venous insufficiency   .  Arthritis   . Depression   . COPD (chronic obstructive pulmonary disease)   . Hypothyroidism   . Thrombocytopenia   . Hypokalemia   . Edema   . Respiratory failure     Ventilator-dependent respiratory failure secondary to hypercarbia, cardiac arrest, presumed penumonia 12/2008  . Cardiac arrest     12/2010 in setting of respiratory failure with less than 15 minutes of CPR; initial rhythm was asystole  . PVD (peripheral vascular disease)     Eval by Dr. Excell Seltzer 2012: noninvasive immaging suggested significant aortoiliac disease, ABIs in mild range  - med rx given significant comorbidities  . H/O endoscopy     For ?ampullary mass - had EGD 07/2010 with no evidence of ampullary mass, some CBD dilitation   . Chronic pain   . Lumbar stenosis     Admitted 09/2011 - not felt to be a good candidate for surgical intervention.  . CHF (congestive heart failure)   . Morbid obesity 10/27/2011  . Chronic diastolic CHF (congestive heart failure) 10/27/2011  . Lower extremity weakness 10/26/2011  . Spinal stenosis of lumbar region 10/25/2011  . Diabetes mellitus   . Hypertension   . Bacteremia     a. Adm 08/2012 with MSSA bacteremia, R leg cellulitis. TTE no endocarditis. TEE deferred. 6 weeks abx.  . Yeast infection     a. Abdominal.  . Cirrhosis     a. By CT 09/2012.  Marland Kitchen Hepatosplenomegaly     a. By CT 09/2012.    Past Surgical History  Procedure Laterality Date  .  Thyroidectomy    . Laparoscopic cholecystectomy    . Lithotripsy       for nephrolithiasis  . Vaginal hysterectomy       for menorrhagia  at age 52  . Breast cyst excision    . Rotator cuff repair    . Cesarean section    . Laminectomy    ergies:   Allergies  Allergen Reactions  . Latex     REACTION: itch  . Penicillins     REACTION: hives - tolerates Ancef     Medications: I have reviewed patients current medications as documented in Epic Anti-infectives   Start     Dose/Rate Route Frequency Ordered Stop   10/20/12 2330   vancomycin (VANCOCIN) IVPB 1000 mg/200 mL premix     1,000 mg 200 mL/hr over 60 Minutes Intravenous Every 12 hours 10/20/12 1010     10/20/12 1100  vancomycin (VANCOCIN) 2,000 mg in sodium chloride 0.9 % 500 mL IVPB     2,000 mg 250 mL/hr over 120 Minutes Intravenous  Once 10/20/12 1010 10/20/12 1232   10/20/12 1100  ceFEPIme (MAXIPIME) 1 g in dextrose 5 % 50 mL IVPB     1 g 100 mL/hr over 30 Minutes Intravenous Every 8 hours 10/20/12 1010     10/19/12 1000  fluconazole (DIFLUCAN) tablet 100 mg  Status:  Discontinued     100 mg Oral Daily 10/18/12 1332 10/18/12 1334   10/19/12 1000  fluconazole (DIFLUCAN) tablet 100 mg     100 mg Oral Daily 10/18/12 1334 10/19/12 1117   10/18/12 1400  ceFAZolin (ANCEF) IVPB 2 g/50 mL premix  Status:  Discontinued     2 g 100 mL/hr over 30 Minutes Intravenous 3 times per day 10/18/12 1331 10/20/12 0952   10/14/12 1400  ceFAZolin (ANCEF) IVPB 2 g/50 mL premix  Status:  Discontinued     2 g 100 mL/hr over 30 Minutes Intravenous 3 times per day 10/14/12 1128 10/18/12 1331   10/14/12 1000  fluconazole (DIFLUCAN) tablet 100 mg  Status:  Discontinued     100 mg Oral Daily 10/13/12 2133 10/18/12 1332   10/13/12 2200  ceFAZolin (ANCEF) IVPB 2 g/50 mL premix  Status:  Discontinued     2 g 100 mL/hr over 30 Minutes Intravenous 3 times per day 10/13/12 2133 10/14/12 1128      Social History:  reports that she has been smoking Cigarettes.  She has a 90 pack-year smoking history. She uses smokeless tobacco. She reports that she does not drink alcohol or use illicit drugs.  Family History  Problem Relation Age of Onset  . Emphysema Father   . Allergies Sister   . Asthma Sister   . Heart disease Father   . Cancer Mother     As in HPI and primary teams notes otherwise 12 point review of systems is negative  Blood pressure 121/38, pulse 96, temperature 97.8 F (36.6 C), temperature source Oral, resp. rate 25, height 5' (1.524 m), weight 236 lb 8.9 oz (107.3  kg), SpO2 94.00%. General:asleep, opens eyes to name and noxious stimuli HEENT: anicteric sclera, pupils reactive to light and accommodation, EOMI, oropharynx clear and without exudate CVS tachycardic II/VI murmur Chest: fairly  clear to auscultation bilaterally, anteriorly Abdomen: soft nontender, nondistended, normal bowel sounds, Extremities: venous stasis changes R>L Neuro: nonfocal, strength and sensation intact   Results for orders placed during the hospital encounter of 10/13/12 (from the past 48 hour(s))  LACTIC ACID, PLASMA  Status: Abnormal   Collection Time    10/18/12  6:45 PM      Result Value Range   Lactic Acid, Venous 2.6 (*) 0.5 - 2.2 mmol/L  BASIC METABOLIC PANEL     Status: Abnormal   Collection Time    10/18/12  6:45 PM      Result Value Range   Sodium 129 (*) 135 - 145 mEq/L   Potassium 3.0 (*) 3.5 - 5.1 mEq/L   Chloride 80 (*) 96 - 112 mEq/L   CO2 42 (*) 19 - 32 mEq/L   Comment: CRITICAL RESULT CALLED TO, READ BACK BY AND VERIFIED WITH:     Coralie Common [id number] WILDERK   Glucose, Bld 236 (*) 70 - 99 mg/dL   BUN 27 (*) 6 - 23 mg/dL   Creatinine, Ser 0.45 (*) 0.50 - 1.10 mg/dL   Calcium 9.2  8.4 - 40.9 mg/dL   GFR calc non Af Amer 45 (*) >90 mL/min   GFR calc Af Amer 52 (*) >90 mL/min   Comment: (NOTE)     The eGFR has been calculated using the CKD EPI equation.     This calculation has not been validated in all clinical situations.     eGFR's persistently <90 mL/min signify possible Chronic Kidney     Disease.  MAGNESIUM     Status: None   Collection Time    10/18/12  6:45 PM      Result Value Range   Magnesium 2.3  1.5 - 2.5 mg/dL  GLUCOSE, CAPILLARY     Status: Abnormal   Collection Time    10/18/12  9:13 PM      Result Value Range   Glucose-Capillary 180 (*) 70 - 99 mg/dL   Comment 1 Notify RN     Comment 2 Documented in Chart    MAGNESIUM     Status: None   Collection Time    10/19/12  4:45 AM      Result Value Range   Magnesium 2.4   1.5 - 2.5 mg/dL  BASIC METABOLIC PANEL     Status: Abnormal   Collection Time    10/19/12  4:45 AM      Result Value Range   Sodium 133 (*) 135 - 145 mEq/L   Potassium 2.6 (*) 3.5 - 5.1 mEq/L   Comment: CRITICAL RESULT CALLED TO, READ BACK BY AND VERIFIED WITH:     HARAWAY J,RN 10/19/12 0605 WAYK   Chloride 82 (*) 96 - 112 mEq/L   CO2 44 (*) 19 - 32 mEq/L   Comment: CRITICAL RESULT CALLED TO, READ BACK BY AND VERIFIED WITH:     HARAWAY J,RN 10/19/12 0605 WAYK   Glucose, Bld 152 (*) 70 - 99 mg/dL   BUN 28 (*) 6 - 23 mg/dL   Creatinine, Ser 8.11 (*) 0.50 - 1.10 mg/dL   Calcium 9.4  8.4 - 91.4 mg/dL   GFR calc non Af Amer 50 (*) >90 mL/min   GFR calc Af Amer 58 (*) >90 mL/min   Comment: (NOTE)     The eGFR has been calculated using the CKD EPI equation.     This calculation has not been validated in all clinical situations.     eGFR's persistently <90 mL/min signify possible Chronic Kidney     Disease.  GLUCOSE, CAPILLARY     Status: Abnormal   Collection Time    10/19/12  6:47 AM      Result Value Range  Glucose-Capillary 144 (*) 70 - 99 mg/dL   Comment 1 Documented in Chart     Comment 2 Notify RN    GLUCOSE, CAPILLARY     Status: Abnormal   Collection Time    10/19/12 11:38 AM      Result Value Range   Glucose-Capillary 195 (*) 70 - 99 mg/dL   Comment 1 Notify RN    URINALYSIS, ROUTINE W REFLEX MICROSCOPIC     Status: Abnormal   Collection Time    10/19/12 11:56 AM      Result Value Range   Color, Urine YELLOW  YELLOW   APPearance CLEAR  CLEAR   Specific Gravity, Urine 1.009  1.005 - 1.030   pH 7.5  5.0 - 8.0   Glucose, UA NEGATIVE  NEGATIVE mg/dL   Hgb urine dipstick NEGATIVE  NEGATIVE   Bilirubin Urine NEGATIVE  NEGATIVE   Ketones, ur NEGATIVE  NEGATIVE mg/dL   Protein, ur NEGATIVE  NEGATIVE mg/dL   Urobilinogen, UA 1.0  0.0 - 1.0 mg/dL   Nitrite NEGATIVE  NEGATIVE   Leukocytes, UA SMALL (*) NEGATIVE  NA AND K (SODIUM & POTASSIUM), RAND UR     Status: None    Collection Time    10/19/12 11:56 AM      Result Value Range   Sodium, Ur 88     Potassium Urine Timed 54    CHLORIDE, URINE, RANDOM     Status: None   Collection Time    10/19/12 11:56 AM      Result Value Range   Chloride Urine 94    URINE MICROSCOPIC-ADD ON     Status: Abnormal   Collection Time    10/19/12 11:56 AM      Result Value Range   Squamous Epithelial / LPF FEW (*) RARE   WBC, UA 3-6  <3 WBC/hpf   Bacteria, UA RARE  RARE  BASIC METABOLIC PANEL     Status: Abnormal   Collection Time    10/19/12  1:00 PM      Result Value Range   Sodium 132 (*) 135 - 145 mEq/L   Potassium 2.8 (*) 3.5 - 5.1 mEq/L   Chloride 82 (*) 96 - 112 mEq/L   CO2 43 (*) 19 - 32 mEq/L   Comment: CRITICAL RESULT CALLED TO, READ BACK BY AND VERIFIED WITH:     P.GEORGE,RN 1409 10/19/12 CLARK,S   Glucose, Bld 202 (*) 70 - 99 mg/dL   BUN 28 (*) 6 - 23 mg/dL   Creatinine, Ser 1.61 (*) 0.50 - 1.10 mg/dL   Calcium 9.4  8.4 - 09.6 mg/dL   GFR calc non Af Amer 51 (*) >90 mL/min   GFR calc Af Amer 59 (*) >90 mL/min   Comment: (NOTE)     The eGFR has been calculated using the CKD EPI equation.     This calculation has not been validated in all clinical situations.     eGFR's persistently <90 mL/min signify possible Chronic Kidney     Disease.  PHOSPHORUS     Status: None   Collection Time    10/19/12  1:00 PM      Result Value Range   Phosphorus 3.4  2.3 - 4.6 mg/dL  GLUCOSE, CAPILLARY     Status: Abnormal   Collection Time    10/19/12  4:12 PM      Result Value Range   Glucose-Capillary 162 (*) 70 - 99 mg/dL  BLOOD GAS, ARTERIAL  Status: Abnormal   Collection Time    10/19/12  5:10 PM      Result Value Range   O2 Content 4.0     Delivery systems NASAL CANNULA     pH, Arterial 7.470 (*) 7.350 - 7.450   pCO2 arterial 58.5 (*) 35.0 - 45.0 mmHg   Comment: CRITICAL RESULT CALLED TO, READ BACK BY AND VERIFIED WITH:     PARIS GEORGE RN AT 1716 BY NICKY DICK,RRT,RCP ON 10/19/12   pO2, Arterial  56.6 (*) 80.0 - 100.0 mmHg   Bicarbonate 42.0 (*) 20.0 - 24.0 mEq/L   TCO2 43.8  0 - 100 mmol/L   Acid-Base Excess 16.9 (*) 0.0 - 2.0 mmol/L   O2 Saturation 89.1     Patient temperature 98.6     Collection site LEFT RADIAL     Drawn by [id number]     Sample type ARTERIAL     Allens test (pass/fail) PASS  PASS  MAGNESIUM     Status: None   Collection Time    10/19/12  5:55 PM      Result Value Range   Magnesium 2.5  1.5 - 2.5 mg/dL  GLUCOSE, CAPILLARY     Status: Abnormal   Collection Time    10/19/12  9:55 PM      Result Value Range   Glucose-Capillary 158 (*) 70 - 99 mg/dL  LACTIC ACID, PLASMA     Status: None   Collection Time    10/19/12 10:25 PM      Result Value Range   Lactic Acid, Venous 1.4  0.5 - 2.2 mmol/L  BASIC METABOLIC PANEL     Status: Abnormal   Collection Time    10/20/12  6:00 AM      Result Value Range   Sodium 132 (*) 135 - 145 mEq/L   Potassium 3.8  3.5 - 5.1 mEq/L   Chloride 89 (*) 96 - 112 mEq/L   CO2 36 (*) 19 - 32 mEq/L   Glucose, Bld 142 (*) 70 - 99 mg/dL   BUN 29 (*) 6 - 23 mg/dL   Creatinine, Ser 6.29  0.50 - 1.10 mg/dL   Calcium 9.4  8.4 - 52.8 mg/dL   GFR calc non Af Amer 59 (*) >90 mL/min   GFR calc Af Amer 68 (*) >90 mL/min   Comment: (NOTE)     The eGFR has been calculated using the CKD EPI equation.     This calculation has not been validated in all clinical situations.     eGFR's persistently <90 mL/min signify possible Chronic Kidney     Disease.  MAGNESIUM     Status: Abnormal   Collection Time    10/20/12  6:00 AM      Result Value Range   Magnesium 2.6 (*) 1.5 - 2.5 mg/dL  CBC WITH DIFFERENTIAL     Status: Abnormal   Collection Time    10/20/12  6:06 AM      Result Value Range   WBC 9.6  4.0 - 10.5 K/uL   RBC 4.04  3.87 - 5.11 MIL/uL   Hemoglobin 11.9 (*) 12.0 - 15.0 g/dL   HCT 41.3  24.4 - 01.0 %   MCV 90.3  78.0 - 100.0 fL   MCH 29.5  26.0 - 34.0 pg   MCHC 32.6  30.0 - 36.0 g/dL   RDW 27.2 (*) 53.6 - 64.4 %   Platelets  172  150 - 400 K/uL   Neutrophils  Relative % 68  43 - 77 %   Neutro Abs 6.5  1.7 - 7.7 K/uL   Lymphocytes Relative 20  12 - 46 %   Lymphs Abs 1.9  0.7 - 4.0 K/uL   Monocytes Relative 11  3 - 12 %   Monocytes Absolute 1.1 (*) 0.1 - 1.0 K/uL   Eosinophils Relative 1  0 - 5 %   Eosinophils Absolute 0.1  0.0 - 0.7 K/uL   Basophils Relative 0  0 - 1 %   Basophils Absolute 0.0  0.0 - 0.1 K/uL  GLUCOSE, CAPILLARY     Status: Abnormal   Collection Time    10/20/12  8:11 AM      Result Value Range   Glucose-Capillary 147 (*) 70 - 99 mg/dL  URINALYSIS, ROUTINE W REFLEX MICROSCOPIC     Status: Abnormal   Collection Time    10/20/12  8:16 AM      Result Value Range   Color, Urine YELLOW  YELLOW   APPearance CLEAR  CLEAR   Specific Gravity, Urine 1.017  1.005 - 1.030   pH 8.5 (*) 5.0 - 8.0   Glucose, UA NEGATIVE  NEGATIVE mg/dL   Hgb urine dipstick SMALL (*) NEGATIVE   Bilirubin Urine NEGATIVE  NEGATIVE   Ketones, ur 15 (*) NEGATIVE mg/dL   Protein, ur NEGATIVE  NEGATIVE mg/dL   Urobilinogen, UA 1.0  0.0 - 1.0 mg/dL   Nitrite NEGATIVE  NEGATIVE   Leukocytes, UA LARGE (*) NEGATIVE  URINE MICROSCOPIC-ADD ON     Status: None   Collection Time    10/20/12  8:16 AM      Result Value Range   Squamous Epithelial / LPF RARE  RARE   WBC, UA 11-20  <3 WBC/hpf   RBC / HPF 3-6  <3 RBC/hpf   Bacteria, UA RARE  RARE  POCT I-STAT 3, BLOOD GAS (G3+)     Status: Abnormal   Collection Time    10/20/12 10:34 AM      Result Value Range   pH, Arterial 7.424  7.350 - 7.450   pCO2 arterial 51.3 (*) 35.0 - 45.0 mmHg   pO2, Arterial 95.0  80.0 - 100.0 mmHg   Bicarbonate 33.6 (*) 20.0 - 24.0 mEq/L   TCO2 35  0 - 100 mmol/L   O2 Saturation 97.0     Acid-Base Excess 8.0 (*) 0.0 - 2.0 mmol/L   Patient temperature 98.6 F     Collection site RADIAL, ALLEN'S TEST ACCEPTABLE     Drawn by RT     Sample type ARTERIAL    GLUCOSE, CAPILLARY     Status: Abnormal   Collection Time    10/20/12 11:44 AM       Result Value Range   Glucose-Capillary 154 (*) 70 - 99 mg/dL      Component Value Date/Time   SDES BLOOD LEFT HAND 10/14/2012 1205   SPECREQUEST BOTTLES DRAWN AEROBIC AND ANAEROBIC 10CC 10/14/2012 1205   CULT  Value: NO GROWTH 5 DAYS Performed at [facility] 10/14/2012 1205   REPTSTATUS 10/20/2012 FINAL 10/14/2012 1205   Ct Abdomen Pelvis Wo Contrast  10/19/2012   *RADIOLOGY REPORT*  Clinical Data: Suspected abdominal sepsis  CT ABDOMEN AND PELVIS WITHOUT CONTRAST  Technique:  Multidetector CT imaging of the abdomen and pelvis was performed following the standard protocol without intravenous contrast.  Comparison: Chest CT - 10/13/2012  Findings:  The lack of intravenous contrast limits the ability to evaluate  solid abdominal organs.  There is nodularity of the hepatic contour suggestive of cirrhosis. This finding is associated with splenomegaly and hypertrophied venous collaterals within the splenic hilum and along the left mid abdomen extending to into the right lower pelvis.  No ascites.  Post cholecystectomy.  There is mild dilatation of the common bile duct measuring 11 mm in greatest coronal dimension, presumably sequelae of postcholecystectomy state.  No ascites.  Normal noncontrast appearance of the bilateral kidneys.  No discrete renal stones.  No urinary obstruction. The urinary bladder is decompressed with a Foley catheter.  Normal noncontrast appearance of the bilateral adrenal glands and pancreas.  The bowel is normal in course and caliber without wall thickening. Moderate colonic stool burden without evidence of obstruction.  The appendix is not visualized, however there is no inflammatory change within the right lower abdominal quadrant.  No pneumoperitoneum, pneumatosis or portal venous gas.  Scattered atherosclerotic plaque with a normal caliber abdominal aorta.  Scattered shoddy retroperitoneal lymph nodes are not enlarged by CT criteria.  No definite retroperitoneal, mesenteric,  pelvic or inguinal lymphadenopathy on this noncontrast examination.  Post hysterectomy.  No discrete adnexal lesion.  No free fluid within the pelvis.  Limited visualization of the lower thorax demonstrates trace bilateral effusions with associated partial atelectasis of the bilateral lower lobes, right greater than left.  Cardiomegaly.  Calcifications within the aortic valve leaflets.  No pericardial effusion.  No acute or aggressive osseous abnormalities.  Mild to moderate multilevel lumbar spine DDD, worse at L5 - S1 with disc space height loss, end plate irregularity and sclerosis.  There is eventration of the caudal aspect of the rectus abdominous musculature.  There are two adjacent mesenteric fat containing periumbilical hernias.  IMPRESSION:  1.  Findings within the abdomen or pelvis on this noncontrast examination.  Specifically, no evidence of enteric or urinary obstruction.  2.  Trace bilateral pleural effusions with partial atelectasis of the bilateral lower lobes, right greater than left, though note, underlying infection not excluded.  3.  Cardiomegaly with calcifications within the aortic valve leaflets. 4.  Findings compatible with cirrhosis, splenomegaly and portal venous hypertension with hypertrophied splenic collaterals extending along the left mid hemiabdomen into the right hemi pelvis.  No ascites.   Original Report Authenticated By: Tacey Ruiz, MD   Dg Chest Port 1 View  10/20/2012   *RADIOLOGY REPORT*  Clinical Data: Fever with diastolic heart failure  PORTABLE CHEST - 1 VIEW  Comparison: 10/19/2012  Findings: Stable cardiac enlargement.  PICC line stable.  The perihilar vessels are indistinct and there is vascular congestion with peribronchial cuffing.  There is mild interstitial change similar to the prior study.  Hazy bilateral lower lobe attenuation also similar.  IMPRESSION: Stable mild pulmonary edema.  Hazy bilateral lower lobe densities may represent a combination of small  effusions and atelectasis.   Original Report Authenticated By: Esperanza Heir, M.D.   Dg Chest Port 1 View  10/19/2012   *RADIOLOGY REPORT*  Clinical Data: Respiratory distress  PORTABLE CHEST - 1 VIEW  Comparison: 10/13/2012  Findings: Cardiomediastinal silhouette is stable. Stable right arm PICC line position.  Residual mild interstitial prominence bilaterally without convincing pulmonary edema.  Trace right pleural effusion with right basilar atelectasis.  No segmental infiltrate.  IMPRESSION: Residual mild interstitial prominence bilaterally without convincing pulmonary edema.  Trace right pleural effusion with right basilar atelectasis.  No segmental infiltrate.   Original Report Authenticated By: Natasha Mead, M.D.     Recent Results (from  the past 720 hour(s))  CULTURE, BLOOD (ROUTINE X 2)     Status: None   Collection Time    10/14/12 11:58 AM      Result Value Range Status   Specimen Description BLOOD LEFT ARM   Final   Special Requests BOTTLES DRAWN AEROBIC AND ANAEROBIC 10CC   Final   Culture  Setup Time     Final   Value: 10/14/2012 16:39     Performed at [facility]   Culture     Final   Value: NO GROWTH 5 DAYS     Performed at [facility]   Report Status 10/20/2012 FINAL   Final  CULTURE, BLOOD (ROUTINE X 2)     Status: None   Collection Time    10/14/12 12:05 PM      Result Value Range Status   Specimen Description BLOOD LEFT HAND   Final   Special Requests BOTTLES DRAWN AEROBIC AND ANAEROBIC 10CC   Final   Culture  Setup Time     Final   Value: 10/14/2012 16:39     Performed at [facility]   Culture     Final   Value: NO GROWTH 5 DAYS     Performed at [facility]   Report Status 10/20/2012 FINAL   Final     Impression/Recommendation  68 year old with morbid obesity, COPD, CHF, LE venous stasis, Aortic stenosis and recent SAB admitted with respiratory distress now with obtundation and fevers  #1 Encephalopathy: Likely  multifactorial. With electrolytes, ABG abnormalities +/- line infection playing a role   Given that we did not ever get good imaging of heart valves AND we never had MRI brain I would consider MRI brain when she is stable for this study to look for possible emboli to the CNS (ancef has poor CNS penetration and there would be risk of failure in CNS and need for nafcillin instead)  --for now would continue vancomycin and cefepime, but if cultures negative and have not YET imaged brain with MRI would go with nafcillin instead of ancef UNTIL we can make sure she does not have CNS emboli  I spent greater than 45 minutes with the patient including greater than 50% of time in face to face counsel of the patient and in coordination of their care.   #2 SAB: see above discussion. -- I think we need to get an MRI of the brain which is stable for this with contrast preferably.  --We could pursue transesophageal echocardiogram as well but would defer safety of this given her respiratory status to CCM  #3 Line infection: PICC removed and culture sent as well as peripheral blood cutlures  #4 Screening: check HIV and Hep C  Thank you so much for this interesting consult  [facility] [phone] (pager) [phone] (office) 10/20/2012, 1:24 PM  Paulette Blanch Dam 10/20/2012, 1:24 PM

## 2012-10-20 NOTE — Progress Notes (Signed)
ANTIBIOTIC CONSULT NOTE - INITIAL  Pharmacy Consult for fortaz, vancomycin Indication: r/o endocarditis  Allergies  Allergen Reactions  . Latex     REACTION: itch  . Penicillins     REACTION: hives - tolerates Ancef    Patient Measurements: Height: 5' (152.4 cm) Weight: 236 lb 8.9 oz (107.3 kg) IBW/kg (Calculated) : 45.5  Vital Signs: Temp: 99.6 F (37.6 C) (08/27 0800) Temp src: Axillary (08/27 0800) BP: 122/24 mmHg (08/27 0900) Pulse Rate: 108 (08/27 0900) Intake/Output from previous day: 08/26 0701 - 08/27 0700 In: 2515 [P.O.:240; I.V.:975; IV Piggyback:1300] Out: 3400 [Urine:3400] Intake/Output from this shift: Total I/O In: 150 [I.V.:150] Out: 300 [Urine:300]  Labs:  Recent Labs  10/18/12 0523  10/19/12 0445 10/19/12 1300 10/20/12 0600 10/20/12 0606  WBC 7.9  --   --   --   --  9.6  HGB 12.3  --   --   --   --  11.9*  PLT 162  --   --   --   --  172  CREATININE 1.14*  < > 1.12* 1.11* 0.98  --   < > = values in this interval not displayed. Estimated Creatinine Clearance: 62.6 ml/min (by C-G formula based on Cr of 0.98). No results found for this basename: VANCOTROUGH, VANCOPEAK, VANCORANDOM, GENTTROUGH, GENTPEAK, GENTRANDOM, TOBRATROUGH, TOBRAPEAK, TOBRARND, AMIKACINPEAK, AMIKACINTROU, AMIKACIN,  in the last 72 hours     Medical History: Past Medical History  Diagnosis Date  . Carotid bruit     0-39% bilateral ICA by dopplers 03/2010  . Aortic stenosis     a. Moderate echo 01/2009, then severe 03/2010 --> last echo 09/2011:  EF 60-65%, grade 2 diastolic dysfunction, severe aortic stenosis, mean gradient 50, mild MR    b. Not an operative candidate due to comorbidities, seen in TAVR clinic 02/2012 but pt deferred further testing. c. low risk myovue 11/2007 breast attenuation EF 63%  . Venous insufficiency   . Arthritis   . Depression   . COPD (chronic obstructive pulmonary disease)   . Hypothyroidism   . Thrombocytopenia   . Hypokalemia   . Edema   .  Respiratory failure     Ventilator-dependent respiratory failure secondary to hypercarbia, cardiac arrest, presumed penumonia 12/2008  . Cardiac arrest     12/2010 in setting of respiratory failure with less than 15 minutes of CPR; initial rhythm was asystole  . PVD (peripheral vascular disease)     Eval by Dr. Excell Seltzer 2012: noninvasive immaging suggested significant aortoiliac disease, ABIs in mild range  - med rx given significant comorbidities  . H/O endoscopy     For ?ampullary mass - had EGD 07/2010 with no evidence of ampullary mass, some CBD dilitation   . Chronic pain   . Lumbar stenosis     Admitted 09/2011 - not felt to be a good candidate for surgical intervention.  . CHF (congestive heart failure)   . Morbid obesity 10/27/2011  . Chronic diastolic CHF (congestive heart failure) 10/27/2011  . Lower extremity weakness 10/26/2011  . Spinal stenosis of lumbar region 10/25/2011  . Diabetes mellitus   . Hypertension   . Bacteremia     a. Adm 08/2012 with MSSA bacteremia, R leg cellulitis. TTE no endocarditis. TEE deferred. 6 weeks abx.  . Yeast infection     a. Abdominal.  . Cirrhosis     a. By CT 09/2012.  Marland Kitchen Hepatosplenomegaly     a. By CT 09/2012.    Medications:  Scheduled:  .  FLUoxetine  20 mg Oral Daily  . heparin  5,000 Units Subcutaneous Q8H  . hydrocerin  1 application Topical Daily  . insulin aspart  0-15 Units Subcutaneous TID WC  . insulin aspart  0-5 Units Subcutaneous QHS  . insulin aspart  4 Units Subcutaneous TID WC  . insulin glargine  12 Units Subcutaneous QHS  . levothyroxine  137 mcg Oral QAC breakfast  . potassium chloride SA  40 mEq Oral TID  . sodium chloride  500 mL Intravenous Once   Assessment: 67 yo female with acute encephalopathy and now noted with hypotension and fever (noted recent MSSA bactermia). Patient was on ancef for cellulitis to change to vancomycin and cefepime. SCr= 0.98 and CrCl~ 60.   Ancef preadmit>>8/27 Cefepime 8/27>> Vancomycin  8/27>>  8/21 blood  Goal of Therapy:  Vancomycin trough level 15-20 mcg/ml  Plan:  -vancomycin IV 2000 mg x1 followed by 1000mg  IV q12h -Cefepime 1gm IV q8h -Will follow renal function, cultures and clinical progress  Harland German, Pharm D 10/20/2012 10:05 AM

## 2012-10-20 NOTE — Consult Note (Signed)
PULMONARY  / CRITICAL CARE MEDICINE  Name: [name] MRN: [id number] DOB: Jul 09, 1945    ADMISSION DATE:  10/13/2012 CONSULTATION DATE:  10/19/2012  REFERRING MD :  [name] PRIMARY SERVICE:  PCCM  CHIEF COMPLAINT:  Acute encephalopathy  BRIEF PATIENT DESCRIPTION: 67 yo with past medical history of severe AS, diastolic CHF, COPD (on 2 L oxygen) and recent admission for RLE MSSA cellulitis (on Ancef) admitted on 8/20 with increased oxygen requirements and dyspnea.  Agressively diuresed. On 8/26 moved to SDU due to acute encephalopathy and multiple metabolic disturbances. Of note, husband reports frequent swings in level of consciousness since about 6 months ago.  PCCM was consulted.  SIGNIFICANT EVENTS / STUDIES:  8/26  CT abdomen / pelvis >>>cirrhosis, splenomegaly and portal  venous hypertension no evidence of enteric or urinary obstruction. 8/27- Int hypotension, tachypnea, fever  LINES / TUBES: PICC preadmission 7/29>>> Foley 8/26 >>>  CULTURES: 8/27 BC>>>  ANTIBIOTICS: Ancef preadmission >>>  INTERVAL HISTORY: fever, lethargic  VITAL SIGNS: Temp:  [97.6 F (36.4 C)-100.1 F (37.8 C)] 99.6 F (37.6 C) (08/27 0800) Pulse Rate:  [89-114] 108 (08/27 0900) Resp:  [10-25] 25 (08/27 0900) BP: (92-143)/(17-76) 122/24 mmHg (08/27 0900) SpO2:  [90 %-100 %] 94 % (08/27 0900) FiO2 (%):  [40 %] 40 % (08/26 1820) Weight:  [107.3 kg (236 lb 8.9 oz)] 107.3 kg (236 lb 8.9 oz) (08/27 0500)  HEMODYNAMICS:   VENTILATOR SETTINGS: Vent Mode:  [-]  FiO2 (%):  [40 %] 40 %  INTAKE / OUTPUT: Intake/Output     08/26 0701 - 08/27 0700 08/27 0701 - 08/28 0700   P.O. 240    I.V. (mL/kg) 975 (9.1) 150 (1.4)   IV Piggyback 1300    Total Intake(mL/kg) 2515 (23.4) 150 (1.4)   Urine (mL/kg/hr) 3400 (1.3) 300 (1.1)   Total Output 3400 300   Net -885 -150        Urine Occurrence 3 x      PHYSICAL EXAMINATION: General:  Appears chronically ill, less responsive Neuro:  Obtunded but  arouses to stimulation, gag/cough intact HEENT:  PERRL, dry membranes Cardiovascular:  RRR, no m/r/g Lungs:  Reduced, worse at base Abdomen:  Obese, distended, mild tenderness diffuse Musculoskeletal:  Moves all extremities, chronic stasis dermatitis LE BL, dry Skin:  Erythema LLE, surgical dressing RLE   LABS:  Recent Labs Lab 10/13/12 1419 10/13/12 2123 10/14/12 0045 10/14/12 0454  10/14/12 1158  10/18/12 0523 10/18/12 1205 10/18/12 1845 10/19/12 0445 10/19/12 1300 10/19/12 1710 10/19/12 1755 10/19/12 2225 10/20/12 0600 10/20/12 0606  HGB 10.2* 11.1*  --   --   --   --   --  12.3  --   --   --   --   --   --   --   --  11.9*  WBC 5.2 6.2  --   --   --   --   --  7.9  --   --   --   --   --   --   --   --  9.6  PLT 138* 171  --   --   --   --   --  162  --   --   --   --   --   --   --   --  172  NA 139  --   --   --   < >  --   < > 132* 131* 129* 133* 132*  --   --   --  132*  --   K 3.8  --   --   --   < >  --   < > 3.1* 3.2* 3.0* 2.6* 2.8*  --   --   --  3.8  --   CL 100  --   --   --   < >  --   < > 81* 80* 80* 82* 82*  --   --   --  89*  --   CO2 34*  --   --   --   < >  --   < > 44* 44* 42* 44* 43*  --   --   --  36*  --   GLUCOSE 104*  --   --   --   < >  --   < > 146* 238* 236* 152* 202*  --   --   --  142*  --   BUN 13  --   --   --   < >  --   < > 26* 27* 27* 28* 28*  --   --   --  29*  --   CREATININE 0.73 0.70  --   --   < >  --   < > 1.14* 1.28* 1.23* 1.12* 1.11*  --   --   --  0.98  --   CALCIUM 8.5  --   --   --   < >  --   < > 9.3 9.3 9.2 9.4 9.4  --   --   --  9.4  --   MG  --  1.9  --   --   < >  --   < > 2.4  --  2.3 2.4  --   --  2.5  --  2.6*  --   PHOS  --   --   --   --   --   --   --   --   --   --   --  3.4  --   --   --   --   --   AST 43*  --   --   --   --   --   --   --   --   --   --   --   --   --   --   --   --   ALT <5  --   --   --   --   --   --   --   --   --   --   --   --   --   --   --   --   ALKPHOS 144*  --   --   --   --   --   --    --   --   --   --   --   --   --   --   --   --   BILITOT 0.7  --   --   --   --   --   --   --   --   --   --   --   --   --   --   --   --   PROT 7.0  --   --   --   --   --   --   --   --   --   --   --   --   --   --   --   --  ALBUMIN 2.5*  --   --   --   --   --   --   --   --   --   --   --   --   --   --   --   --   APTT  --   --   --   --   --   --   --   --  41*  --   --   --   --   --   --   --   --   INR  --   --   --   --   --   --   --   --  1.23  --   --   --   --   --   --   --   --   LATICACIDVEN  --   --   --   --   --   --   --   --   --  2.6*  --   --   --   --  1.4  --   --   TROPONINI  --   --  <0.30 <0.30  --  <0.30  --   --   --   --   --   --   --   --   --   --   --   PROBNP 694.1*  --   --   --   --   --   --   --   --   --   --   --   --   --   --   --   --   PHART  --   --   --   --   --   --   --   --   --   --   --   --  7.470*  --   --   --   --   PCO2ART  --   --   --   --   --   --   --   --   --   --   --   --  58.5*  --   --   --   --   PO2ART  --   --   --   --   --   --   --   --   --   --   --   --  56.6*  --   --   --   --   < > = values in this interval not displayed.  Recent Labs Lab 10/19/12 0647 10/19/12 1138 10/19/12 1612 10/19/12 2155 10/20/12 0811  GLUCAP 144* 195* 162* 158* 147*   CXR:  8/27- atx bases  ASSESSMENT / PLAN:  PULMONARY A:  COPD without evidence of exacerbation.  Chronic hypercarbic / hypoxemic respiratory failure (CO2 at baseline).  Now more lethargic, r/o narcosis now P:   Gaol SpO2>92 Supplemental oxygen PRN Added Albuterol PRN ABG now, may rewire ett, more to ICU  CARDIOVASCULAR A: Chronic diastolic CHF, exacerbation no resolved.  Severe IS.  Borderline elevated lactate. R/o endocarditis Preload dependence from AS P:  Goal MAP>60, met on won, low threshold bolus Cardiology following Recheck lactate re assuring Needs repeat cho and likely TEE when able, see ID volume  RENAL A:  Acute renal failure  (prerenal?).  Contraction alkalosis.  Hypovolemia.  Hypokalemia. P:  Trend BMP in am  Start NS@75 , bolus, may need further increase rate  GASTROINTESTINAL A:  Abdominal pain.  History of hernia, cirrhosis on CT, poor neurostatus P:   GI Px is not indicated Abdomen / pelvis CT-reviewed Npo lft in am  ppi  HEMATOLOGIC A:  cirhosis P:  Trend CBC Heparin for DVT Px  INFECTIOUS A:  R/o picc line sepsis source, r/o endocarditis, LE cellulitis, mild UTI P:   Continue Ancef as preadmission-dc STAT vanc, cefipime ID consult called BC x 3 if able Echo Tee if clinically safe Foley new  ENDOCRINE  A:  DM.  Hyperglycemia.  Hypothyroidism. P:   Meals coverage + basal coverage Add SSI  Synthroid Dc lantus as NPO repeat cortisol  NEUROLOGIC A:  Encephalopathy (acute on chronic).  Appeared to be on high doses opioids / benzodiazepines preadmission. No evidence of withdrawal at this time. Sepsis encephalopathy likely P:   Continue to hold Xanax, MS Contin, MS IR Continue Prozac Treat sepsis Move to icu abg  I have personally obtained a history, examined the patient, evaluated laboratory and imaging results, formulated the assessment and plan and placed orders. Ccm time 30 min Can take to pccm service  Nelda Bucks., MD Pulmonary and Critical Care Medicine [facility] Pager: [phone]  10/20/2012, 9:38 AM

## 2012-10-20 NOTE — Progress Notes (Addendum)
Spoke with  Dr. Darrick Penna and notified of low grade temp 100.1,continued hypotension and new   Tachycardia.  Pt no change in lethargy.  Orders received.  Continue to monitor closely.

## 2012-10-21 ENCOUNTER — Inpatient Hospital Stay (HOSPITAL_COMMUNITY): Payer: Medicare Other

## 2012-10-21 LAB — POCT I-STAT 3, ART BLOOD GAS (G3+)
O2 Saturation: 95 %
pCO2 arterial: 35.3 mmHg (ref 35.0–45.0)
pH, Arterial: 7.471 — ABNORMAL HIGH (ref 7.350–7.450)

## 2012-10-21 LAB — CBC WITH DIFFERENTIAL/PLATELET
Basophils Absolute: 0 10*3/uL (ref 0.0–0.1)
Lymphocytes Relative: 13 % (ref 12–46)
Lymphs Abs: 1 10*3/uL (ref 0.7–4.0)
Neutro Abs: 6.3 10*3/uL (ref 1.7–7.7)
Neutrophils Relative %: 81 % — ABNORMAL HIGH (ref 43–77)
Platelets: 145 10*3/uL — ABNORMAL LOW (ref 150–400)
RBC: 4.12 MIL/uL (ref 3.87–5.11)
RDW: 15.6 % — ABNORMAL HIGH (ref 11.5–15.5)
WBC: 7.8 10*3/uL (ref 4.0–10.5)

## 2012-10-21 LAB — COMPREHENSIVE METABOLIC PANEL
Alkaline Phosphatase: 118 U/L — ABNORMAL HIGH (ref 39–117)
BUN: 24 mg/dL — ABNORMAL HIGH (ref 6–23)
Chloride: 95 mEq/L — ABNORMAL LOW (ref 96–112)
Creatinine, Ser: 0.63 mg/dL (ref 0.50–1.10)
GFR calc Af Amer: >90 mL/min (ref >90)
Glucose, Bld: 177 mg/dL — ABNORMAL HIGH (ref 70–99)
Potassium: 2.9 mEq/L — ABNORMAL LOW (ref 3.5–5.1)
Total Bilirubin: 1.1 mg/dL (ref 0.3–1.2)

## 2012-10-21 LAB — MAGNESIUM: Magnesium: 2.2 mg/dL (ref 1.5–2.5)

## 2012-10-21 LAB — GLUCOSE, CAPILLARY: Glucose-Capillary: 123 mg/dL — ABNORMAL HIGH (ref 70–99)

## 2012-10-21 MED ORDER — DEXTROSE 5 % IV SOLN
1.0000 g | Freq: Three times a day (TID) | INTRAVENOUS | Status: AC
  Administered 2012-10-21 – 2012-10-24 (×10): 1 g via INTRAVENOUS
  Filled 2012-10-21 (×10): qty 1

## 2012-10-21 MED ORDER — POTASSIUM CHLORIDE 10 MEQ/100ML IV SOLN
10.0000 meq | INTRAVENOUS | Status: AC
  Administered 2012-10-21 (×5): 10 meq via INTRAVENOUS
  Filled 2012-10-21: qty 300
  Filled 2012-10-21: qty 200

## 2012-10-21 MED ORDER — BIOTENE DRY MOUTH MT LIQD
15.0000 mL | Freq: Two times a day (BID) | OROMUCOSAL | Status: DC
  Administered 2012-10-21 – 2012-10-28 (×13): 15 mL via OROMUCOSAL

## 2012-10-21 MED ORDER — PROMETHAZINE HCL 25 MG/ML IJ SOLN
12.5000 mg | Freq: Once | INTRAMUSCULAR | Status: AC
  Administered 2012-10-21: 12.5 mg via INTRAVENOUS
  Filled 2012-10-21: qty 1

## 2012-10-21 MED ORDER — METOCLOPRAMIDE HCL 5 MG/ML IJ SOLN
5.0000 mg | Freq: Two times a day (BID) | INTRAMUSCULAR | Status: DC
  Administered 2012-10-21 – 2012-10-28 (×14): 5 mg via INTRAVENOUS
  Filled 2012-10-21 (×16): qty 1

## 2012-10-21 MED ORDER — FENTANYL CITRATE 0.05 MG/ML IJ SOLN
50.0000 ug | INTRAMUSCULAR | Status: DC | PRN
  Administered 2012-10-21 – 2012-10-23 (×10): 50 ug via INTRAVENOUS
  Filled 2012-10-21 (×7): qty 2
  Filled 2012-10-21: qty 4
  Filled 2012-10-21 (×2): qty 2

## 2012-10-21 MED ORDER — FENTANYL CITRATE 0.05 MG/ML IJ SOLN
INTRAMUSCULAR | Status: AC
  Administered 2012-10-21: 50 ug via INTRAVENOUS
  Filled 2012-10-21: qty 2

## 2012-10-21 NOTE — Progress Notes (Signed)
[facility]    Subjective: More awake   Antibiotics:  Anti-infectives   Start     Dose/Rate Route Frequency Ordered Stop   10/20/12 2330  vancomycin (VANCOCIN) IVPB 1000 mg/200 mL premix     1,000 mg 200 mL/hr over 60 Minutes Intravenous Every 12 hours 10/20/12 1010     10/20/12 1100  vancomycin (VANCOCIN) 2,000 mg in sodium chloride 0.9 % 500 mL IVPB     2,000 mg 250 mL/hr over 120 Minutes Intravenous  Once 10/20/12 1010 10/20/12 1232   10/20/12 1100  ceFEPIme (MAXIPIME) 1 g in dextrose 5 % 50 mL IVPB     1 g 100 mL/hr over 30 Minutes Intravenous Every 8 hours 10/20/12 1010     10/19/12 1000  fluconazole (DIFLUCAN) tablet 100 mg  Status:  Discontinued     100 mg Oral Daily 10/18/12 1332 10/18/12 1334   10/19/12 1000  fluconazole (DIFLUCAN) tablet 100 mg     100 mg Oral Daily 10/18/12 1334 10/19/12 1117   10/18/12 1400  ceFAZolin (ANCEF) IVPB 2 g/50 mL premix  Status:  Discontinued     2 g 100 mL/hr over 30 Minutes Intravenous 3 times per day 10/18/12 1331 10/20/12 0952   10/14/12 1400  ceFAZolin (ANCEF) IVPB 2 g/50 mL premix  Status:  Discontinued     2 g 100 mL/hr over 30 Minutes Intravenous 3 times per day 10/14/12 1128 10/18/12 1331   10/14/12 1000  fluconazole (DIFLUCAN) tablet 100 mg  Status:  Discontinued     100 mg Oral Daily 10/13/12 2133 10/18/12 1332   10/13/12 2200  ceFAZolin (ANCEF) IVPB 2 g/50 mL premix  Status:  Discontinued     2 g 100 mL/hr over 30 Minutes Intravenous 3 times per day 10/13/12 2133 10/14/12 1128      Medications: Scheduled Meds: . antiseptic oral rinse  15 mL Mouth Rinse BID  . ceFEPime (MAXIPIME) IV  1 g Intravenous Q8H  . FLUoxetine  20 mg Oral Daily  . heparin  5,000 Units Subcutaneous Q8H  . hydrocerin  1 application Topical Daily  . insulin aspart  0-15 Units Subcutaneous TID WC  . insulin aspart  0-5 Units Subcutaneous QHS  . insulin aspart  4 Units Subcutaneous TID WC  . insulin glargine  12 Units  Subcutaneous QHS  . levothyroxine  137 mcg Oral QAC breakfast  . metoCLOPramide (REGLAN) injection  5 mg Intravenous Q12H  . vancomycin  1,000 mg Intravenous Q12H   Continuous Infusions: . sodium chloride 10 mL/hr at 10/21/12 0849   PRN Meds:.albuterol, fentaNYL, ondansetron (ZOFRAN) IV   Objective: Weight change: 3 lb 4.9 oz (1.5 kg)  Intake/Output Summary (Last 24 hours) at 10/21/12 1640 Last data filed at 10/21/12 1400  Gross per 24 hour  Intake   3390 ml  Output   1659 ml  Net   1731 ml   Blood pressure 135/51, pulse 105, temperature 98.3 F (36.8 C), temperature source Oral, resp. rate 22, height 5' (1.524 m), weight 239 lb 13.8 oz (108.8 kg), SpO2 99.00%. Temp:  [97.8 F (36.6 C)-98.4 F (36.9 C)] 98.3 F (36.8 C) (08/28 1200) Pulse Rate:  [88-105] 105 (08/28 1400) Resp:  [14-26] 22 (08/28 1400) BP: (109-168)/(21-119) 135/51 mmHg (08/28 1200) SpO2:  [91 %-100 %] 99 % (08/28 1400) Weight:  [239 lb 13.8 oz (108.8 kg)] 239 lb 13.8 oz (108.8 kg) (08/28 0358)  Physical Exam: General:awake oriented x 3 HEENT: anicteric sclera,  pupils reactive to light and accommodation, EOMI, oropharynx clear and without exudate  CVS tachycardic II/VI murmur  Chest: fairly clear to auscultation bilaterally, anteriorly  Abdomen: soft nontender, nondistended, normal bowel sounds,  Extremities: venous stasis changes R>L  Neuro: nonfocal, strength and sensation intact   Lab Results:  Recent Labs  10/20/12 0606 10/21/12 0400  WBC 9.6 7.8  HGB 11.9* 12.0  HCT 36.5 36.1  PLT 172 145*    BMET  Recent Labs  10/20/12 1841 10/21/12 0400  NA 129* 131*  K 4.1 2.9*  CL 94* 95*  CO2 27 24  GLUCOSE 172* 177*  BUN 26* 24*  CREATININE 0.72 0.63  CALCIUM 8.7 8.9    Micro Results: Recent Results (from the past 240 hour(s))  CULTURE, BLOOD (ROUTINE X 2)     Status: None   Collection Time    10/14/12 11:58 AM      Result Value Range Status   Specimen Description BLOOD LEFT ARM    Final   Special Requests BOTTLES DRAWN AEROBIC AND ANAEROBIC 10CC   Final   Culture  Setup Time     Final   Value: 10/14/2012 16:39     Performed at [facility]   Culture     Final   Value: NO GROWTH 5 DAYS     Performed at [facility]   Report Status 10/20/2012 FINAL   Final  CULTURE, BLOOD (ROUTINE X 2)     Status: None   Collection Time    10/14/12 12:05 PM      Result Value Range Status   Specimen Description BLOOD LEFT HAND   Final   Special Requests BOTTLES DRAWN AEROBIC AND ANAEROBIC 10CC   Final   Culture  Setup Time     Final   Value: 10/14/2012 16:39     Performed at [facility]   Culture     Final   Value: NO GROWTH 5 DAYS     Performed at [facility]   Report Status 10/20/2012 FINAL   Final  CULTURE, BLOOD (ROUTINE X 2)     Status: None   Collection Time    10/20/12 10:00 AM      Result Value Range Status   Specimen Description BLOOD LEFT HAND   Final   Special Requests BOTTLES DRAWN AEROBIC ONLY 10CC   Final   Culture  Setup Time     Final   Value: 10/20/2012 23:47     Performed at [facility]   Culture     Final   Value:        BLOOD CULTURE RECEIVED NO GROWTH TO DATE CULTURE WILL BE HELD FOR 5 DAYS BEFORE ISSUING A FINAL NEGATIVE REPORT     Performed at [facility]   Report Status PENDING   Incomplete  CULTURE, BLOOD (ROUTINE X 2)     Status: None   Collection Time    10/20/12 10:10 AM      Result Value Range Status   Specimen Description BLOOD RIGHT HAND   Final   Special Requests BOTTLES DRAWN AEROBIC ONLY 10CC   Final   Culture  Setup Time     Final   Value: 10/20/2012 23:47     Performed at [facility]   Culture     Final   Value:        BLOOD CULTURE RECEIVED NO GROWTH TO DATE CULTURE WILL BE HELD FOR 5 DAYS BEFORE ISSUING  A FINAL NEGATIVE REPORT     Performed at [facility]   Report Status PENDING   Incomplete    Studies/Results: Ct Abdomen Pelvis Wo Contrast  10/19/2012    *RADIOLOGY REPORT*  Clinical Data: Suspected abdominal sepsis  CT ABDOMEN AND PELVIS WITHOUT CONTRAST  Technique:  Multidetector CT imaging of the abdomen and pelvis was performed following the standard protocol without intravenous contrast.  Comparison: Chest CT - 10/13/2012  Findings:  The lack of intravenous contrast limits the ability to evaluate solid abdominal organs.  There is nodularity of the hepatic contour suggestive of cirrhosis. This finding is associated with splenomegaly and hypertrophied venous collaterals within the splenic hilum and along the left mid abdomen extending to into the right lower pelvis.  No ascites.  Post cholecystectomy.  There is mild dilatation of the common bile duct measuring 11 mm in greatest coronal dimension, presumably sequelae of postcholecystectomy state.  No ascites.  Normal noncontrast appearance of the bilateral kidneys.  No discrete renal stones.  No urinary obstruction. The urinary bladder is decompressed with a Foley catheter.  Normal noncontrast appearance of the bilateral adrenal glands and pancreas.  The bowel is normal in course and caliber without wall thickening. Moderate colonic stool burden without evidence of obstruction.  The appendix is not visualized, however there is no inflammatory change within the right lower abdominal quadrant.  No pneumoperitoneum, pneumatosis or portal venous gas.  Scattered atherosclerotic plaque with a normal caliber abdominal aorta.  Scattered shoddy retroperitoneal lymph nodes are not enlarged by CT criteria.  No definite retroperitoneal, mesenteric, pelvic or inguinal lymphadenopathy on this noncontrast examination.  Post hysterectomy.  No discrete adnexal lesion.  No free fluid within the pelvis.  Limited visualization of the lower thorax demonstrates trace bilateral effusions with associated partial atelectasis of the bilateral lower lobes, right greater than left.  Cardiomegaly.  Calcifications within the aortic valve  leaflets.  No pericardial effusion.  No acute or aggressive osseous abnormalities.  Mild to moderate multilevel lumbar spine DDD, worse at L5 - S1 with disc space height loss, end plate irregularity and sclerosis.  There is eventration of the caudal aspect of the rectus abdominous musculature.  There are two adjacent mesenteric fat containing periumbilical hernias.  IMPRESSION:  1.  Findings within the abdomen or pelvis on this noncontrast examination.  Specifically, no evidence of enteric or urinary obstruction.  2.  Trace bilateral pleural effusions with partial atelectasis of the bilateral lower lobes, right greater than left, though note, underlying infection not excluded.  3.  Cardiomegaly with calcifications within the aortic valve leaflets. 4.  Findings compatible with cirrhosis, splenomegaly and portal venous hypertension with hypertrophied splenic collaterals extending along the left mid hemiabdomen into the right hemi pelvis.  No ascites.   Original Report Authenticated By: Tacey Ruiz, MD   Dg Chest [facility]  10/21/2012   *RADIOLOGY REPORT*  Clinical Data: Shortness of breath.  PORTABLE CHEST - 1 VIEW  Comparison: 10/20/2012.  Findings: The right PICC line has been removed.  Persistent low lung volumes with vascular crowding and atelectasis.  There is also vascular congestion and mild interstitial edema.  Probable small pleural effusions.  IMPRESSION:  1.  Removal of right PICC line. 2.  Slight worsening lung aeration in part due to lower lung volumes.  Slight worsening pulmonary edema.   Original Report Authenticated By: Rudie Meyer, M.D.   Dg Chest Port 1 View  10/20/2012   *RADIOLOGY REPORT*  Clinical Data: Fever  with diastolic heart failure  PORTABLE CHEST - 1 VIEW  Comparison: 10/19/2012  Findings: Stable cardiac enlargement.  PICC line stable.  The perihilar vessels are indistinct and there is vascular congestion with peribronchial cuffing.  There is mild interstitial change similar to  the prior study.  Hazy bilateral lower lobe attenuation also similar.  IMPRESSION: Stable mild pulmonary edema.  Hazy bilateral lower lobe densities may represent a combination of small effusions and atelectasis.   Original Report Authenticated By: Esperanza Heir, M.D.   Dg Chest Port 1 View  10/19/2012   *RADIOLOGY REPORT*  Clinical Data: Respiratory distress  PORTABLE CHEST - 1 VIEW  Comparison: 10/13/2012  Findings: Cardiomediastinal silhouette is stable. Stable right arm PICC line position.  Residual mild interstitial prominence bilaterally without convincing pulmonary edema.  Trace right pleural effusion with right basilar atelectasis.  No segmental infiltrate.  IMPRESSION: Residual mild interstitial prominence bilaterally without convincing pulmonary edema.  Trace right pleural effusion with right basilar atelectasis.  No segmental infiltrate.   Original Report Authenticated By: Natasha Mead, M.D.   Dg Abd Portable 1v  10/21/2012   *RADIOLOGY REPORT*  Clinical Data: Abdominal pain  PORTABLE ABDOMEN - 1 VIEW  Comparison: CT scan 10/19/2012  Findings: Moderate colonic stool is noted.  There is mild gaseous distended small bowel loop in mid abdomen.  Ileus or enteritis cannot be excluded. Mild dextroscoliosis and mild degenerative changes lumbar spine.  The study is limited by patient's large body habitus.  IMPRESSION: Mild gaseous distended small bowel loop in mid abdomen.  Ileus or enteritis cannot be excluded.   Original Report Authenticated By: Natasha Mead, M.D.      Assessment/Plan: [name] is a 67 y.o. female with morbid obesity, COPD, CHF, LE venous stasis, Aortic stenosis and recent SAB admitted with respiratory distress now with obtundation and fevers    #1 Encephalopathy: Likely multifactorial. With electrolytes, ABG abnormalities +/- line infection playing a role   Given that we did not ever get good imaging of heart valves AND we never had MRI brain I would consider MRI brain when  she is stable for this study to look for possible emboli to the CNS (ancef has poor CNS penetration and there would be risk of failure in CNS and need for nafcillin instead)   --for now would continue cefepime and DC vancomycin given no growth so far, but if cultures negative and have not YET imaged brain with MRI would  Narrow back to nafcillin instead of ancef UNTIL we can make sure she does not have CNS emboli     #2 SAB: see above discussion.  She was to finish her course of abx on the 31st of August but given recent events would:  --get an MRI of the brain which is stable for this with contrast preferably.  -- TEE --followup blood and PICC line cultures  #3 Line infection: PICC removed and culture sent as well as peripheral blood cutlures  #4 Screening: HIV and Hep C rechecked this admission, were negative in 2012  Dr. Ninetta Lights is taking over the service tomorrow.   LOS: 8 days   Acey Lav 10/21/2012, 4:40 PM

## 2012-10-21 NOTE — Progress Notes (Signed)
Primary Physician: [name], MD Primary Cardiologist: Eden Emms. Also seen 02/2012 by Cooper/Owen in TAVR clinic  Chief Complaint: SOB, a/c LEE,  weight gain 20-30lb   HF Rounding NOTE    HPI: [name] is a 67 y/o F with morbid obesity, moderate to severe AS, spinal stenosis (uses mostly wheelchair), COPD on home O2, and chronic pain requiring narcotics admitted with recurrent volume overload and resp distress. Getting IV abx due to recent bacteremia and LE cellulitis.   She has been diuresing with lasix drip and Metolazone. Diamox added due to alkalosis. On 8/26 she was transferred to stepdown and then ICU due to lethargy and hypotension. All diuretics stopped. Weight 272->246>236>239 pounds .   Yesterday had fever. Abx broadened. Now defervescing. BCX drawn and pending. Awaiting TEE to evaluate for endocarditis. TTE with no obvious vegetation. ID on board. Remains nauseated. Much more awake. Denies SOB. Lying flat. PH on ABG remains 7.47   Inpatient Medications:  . ceFEPime (MAXIPIME) IV  1 g Intravenous Q8H  . FLUoxetine  20 mg Oral Daily  . heparin  5,000 Units Subcutaneous Q8H  . hydrocerin  1 application Topical Daily  . insulin aspart  0-15 Units Subcutaneous TID WC  . insulin aspart  0-5 Units Subcutaneous QHS  . insulin aspart  4 Units Subcutaneous TID WC  . insulin glargine  12 Units Subcutaneous QHS  . levothyroxine  137 mcg Oral QAC breakfast  . vancomycin  1,000 mg Intravenous Q12H   . sodium chloride 125 mL/hr at 10/21/12 0239    Allergies:  Allergies  Allergen Reactions  . Latex     REACTION: itch  . Penicillins     REACTION: hives - tolerates Ancef    Physical Exam: Blood pressure 113/39, pulse 86, temperature 98.6 F (37 C), temperature source Oral, resp. rate 20, height 5' (1.524 m), weight 117.119 kg (258 lb 3.2 oz), SpO2 96.00%. General: Awake, obese WF in no acute distress. Head: Normocephalic, atraumatic, sclera non-icteric, no xanthomas,  nares are without discharge.  Neck: JVD difficult to see due to body habitus. Bilateral carotid bruits, question radiation from murmur. Lungs: Diminished BS at bases and coarse otherwise. Breathing is unlabored. On 2 liters  Heart: RRR with S1 S2. 2/6 SEM RUSB. S2 markedly depressed Abdomen: Massively obese, non-tender, non-distended with normoactive bowel sounds.  Msk:  Strength and tone appear normal for age. Extremities: No clubbing or cyanosis. Bilateral LE erythematous skin changes and significant R>L with cracking of the skin. RLE with nodular thickening. Trace edema Neuro: Alert and oriented X 3. No facial asymmetry. No focal deficit. Moves all extremities spontaneously. Psych:  Responds to questions appropriately with a pleasant affect. GU: Foley   EKG: Sinus Tach 90-114 Labs: No results found for this basename: CKTOTAL, CKMB, TROPONINI,  in the last 72 hours Lab Results  Component Value Date   WBC 7.8 10/21/2012   HGB 12.0 10/21/2012   HCT 36.1 10/21/2012   MCV 87.6 10/21/2012   PLT 145* 10/21/2012     Recent Labs Lab 10/21/12 0400  NA 131*  K 2.9*  CL 95*  CO2 24  BUN 24*  CREATININE 0.63  CALCIUM 8.9  PROT 7.8  BILITOT 1.1  ALKPHOS 118*  ALT PENDING  AST 45*  GLUCOSE 177*    Lab Results  Component Value Date   DDIMER 2.00* 10/13/2012    Radiology/Studies:  Dg Chest 2 View  10/13/2012   CLINICAL DATA:  67 year old female with shortness of breath. Cellulitis.  EXAM: CHEST  2 VIEW  COMPARISON:  09/17/2012 and earlier.  FINDINGS: Right PICC line in place, tip at the level of the lower SVC. Small right pleural effusion new or increased since 09/17/2012. Diffuse increased interstitial opacity also progressed. No pneumothorax. No consolidation. Stable cardiomegaly and mediastinal contours. Stable visualized osseous structures.  IMPRESSION: 1.  Small right pleural effusion is new/ increased.  2. Increased interstitial markings, favor pulmonary interstitial edema. Viral/  atypical infection is less likely.  3. Chronic cardiomegaly.  4. Right PICC line in place, tip at the lower SVC level.   Electronically Signed   By: Augusto Gamble   On: 10/13/2012 15:55    Ct Angio Chest Pe W/cm &/or Wo Cm 10/14/2012   *RADIOLOGY REPORT*  Clinical Data: Shortness of breath and cough.  Hypoxia.  CT ANGIOGRAPHY CHEST  Technique:  Multidetector CT imaging of the chest using the standard protocol during bolus administration of intravenous contrast. Multiplanar reconstructed images including MIPs were obtained and reviewed to evaluate the vascular anatomy.  Contrast: OMNIPAQUE IOHEXOL 350 MG/ML SOLN  Comparison: Chest x-ray dated 10/13/2012  Findings: There is cardiomegaly.  There is a small loculated right pleural effusion.  There is pulmonary vascular congestion of the origin of the main pulmonary arteries suggestive of pulmonary arterial hypertension.    No pulmonary emboli.   There are a few scattered lymph nodes in the mediastinum and left hilum, and none of which are pathologically enlarged.  There is a 3.1 cm soft tissue mass extending behind the right sternoclavicular joint which probably represents a thyroid nodule.  The patient appears to have had previous thyroid surgery on the right and left.  There is slight splenomegaly.  The patient has hepatomegaly with slight nodularity of the liver contour suggesting cirrhosis.  IMPRESSION: 1.  No pulmonary emboli. 2.  Cardiomegaly with right pleural effusion and pulmonary vascular congestion and probable pulmonary arterial hypertension. 3.  Hepatosplenomegaly.  Nodularity of the liver parenchyma consistent with cirrhosis.   Original Report Authenticated By: Francene Boyers, M.D.      Assessment and Plan:  1. 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 Mod-severe aortic stenosis. EF 70% 3. Morbid obesity 4. Spinal stenosis 5. Chronic pain 6. Severe deconditioning 7. PAH by echo 8. COPD on home  O2 9. Recent MSSA bacteremia and R leg cellulits, abx through 10/24/12 via PICC 10. Hypokalemia 11. Metabolic alkalosis 12. Altered mental status 13. Cellulitis- on Ancef. Blood culture 09/17/12 MSSA 14. Fever  Volume status much improved. Remains alkalotic. Can consider resuming diamox. Would hold other diuretics for now but may need to restart soon as she will re-accumulate quickly. Agree with TEE to more fully assess for SBE as tolerated. Her AS is severe.   We will follow.   Daniel Bensimhon,MD 7:02 AM

## 2012-10-21 NOTE — Consult Note (Signed)
PULMONARY  / CRITICAL CARE MEDICINE  Name: [name] MRN: [id number] DOB: 11-18-1945    ADMISSION DATE:  10/13/2012 CONSULTATION DATE:  10/19/2012  REFERRING MD :  [name] PRIMARY SERVICE:  PCCM  CHIEF COMPLAINT:  Acute encephalopathy  BRIEF PATIENT DESCRIPTION: 67 yo with past medical history of severe AS, diastolic CHF, COPD (on 2 L oxygen) and recent admission for RLE MSSA cellulitis (on Ancef) admitted on 8/20 with increased oxygen requirements and dyspnea.  Agressively diuresed. On 8/26 moved to SDU due to acute encephalopathy and multiple metabolic disturbances. Of note, husband reports frequent swings in level of consciousness since about 6 months ago.  PCCM was consulted.  SIGNIFICANT EVENTS / STUDIES:  8/26  CT abdomen / pelvis >>>cirrhosis, splenomegaly and portal  venous hypertension no evidence of enteric or urinary obstruction. 8/27- Int hypotension, tachypnea, fever 8/27 echo - 70%, Technically difficult study. I doubt any significant change from July,2014 valves 8/28- improved  LINES / TUBES: PICC preadmission 7/29>>>8/27 Foley 8/26 >>>  CULTURES: 8/27 BC x 3>>>  ANTIBIOTICS: Ancef preadmission >>>8/27 cefipime 8/27>>> vanc 8/27>>>  INTERVAL HISTORY: fever resolved, neurostatus better, N/V present  VITAL SIGNS: Temp:  [97.8 F (36.6 C)-99.1 F (37.3 C)] 98 F (36.7 C) (08/28 0800) Pulse Rate:  [85-108] 98 (08/28 0800) Resp:  [14-26] 22 (08/28 0800) BP: (92-160)/(18-96) 154/45 mmHg (08/28 0800) SpO2:  [89 %-100 %] 99 % (08/28 0800) Weight:  [108.8 kg (239 lb 13.8 oz)] 108.8 kg (239 lb 13.8 oz) (08/28 0358)  HEMODYNAMICS:   VENTILATOR SETTINGS:    INTAKE / OUTPUT: Intake/Output     08/27 0701 - 08/28 0700 08/28 0701 - 08/29 0700   P.O. 360    I.V. (mL/kg) 2350 (21.6) 125 (1.1)   IV Piggyback 850    Total Intake(mL/kg) 3560 (32.7) 125 (1.1)   Urine (mL/kg/hr) 2725 (1) 50 (0.3)   Emesis/NG output 4 (0)    Total Output 2729 50   Net +831 +75         PHYSICAL EXAMINATION: General:  Appears chronically ill, more awake Neuro:  Follows commands, appropriate HEENT:  PERRL, dry membranes Cardiovascular:  RRR, sys murmur 4/6 unchanged Lungs:  CTA diminshed Abdomen:  Obese, distended unchanged, mild tenderness diffuse Musculoskeletal:  Moves all extremities, chronic stasis dermatitis LE BL, dry Skin:  Erythema LLE mild, surgical dressing RLE   LABS:  Recent Labs Lab 10/14/12 1158  10/18/12 0523 10/18/12 1205 10/18/12 1845  10/19/12 1300 10/19/12 1710  10/19/12 2225 10/20/12 0600 10/20/12 0606 10/20/12 1034 10/20/12 1841 10/21/12 0329 10/21/12 0400  HGB  --   --  12.3  --   --   --   --   --   --   --   --  11.9*  --   --   --  12.0  WBC  --   --  7.9  --   --   --   --   --   --   --   --  9.6  --   --   --  7.8  PLT  --   --  162  --   --   --   --   --   --   --   --  172  --   --   --  145*  NA  --   < > 132* 131* 129*  < > 132*  --   --   --  132*  --   --  129*  --  131*  K  --   < > 3.1* 3.2* 3.0*  < > 2.8*  --   --   --  3.8  --   --  4.1  --  2.9*  CL  --   < > 81* 80* 80*  < > 82*  --   --   --  89*  --   --  94*  --  95*  CO2  --   < > 44* 44* 42*  < > 43*  --   --   --  36*  --   --  27  --  24  GLUCOSE  --   < > 146* 238* 236*  < > 202*  --   --   --  142*  --   --  172*  --  177*  BUN  --   < > 26* 27* 27*  < > 28*  --   --   --  29*  --   --  26*  --  24*  CREATININE  --   < > 1.14* 1.28* 1.23*  < > 1.11*  --   --   --  0.98  --   --  0.72  --  0.63  CALCIUM  --   < > 9.3 9.3 9.2  < > 9.4  --   --   --  9.4  --   --  8.7  --  8.9  MG  --   < > 2.4  --  2.3  < >  --   --   < >  --  2.6*  --   --  2.4  --  2.3  PHOS  --   --   --   --   --   --  3.4  --   --   --   --   --   --   --   --   --   AST  --   --   --   --   --   --   --   --   --   --   --   --   --   --   --  45*  ALT  --   --   --   --   --   --   --   --   --   --   --   --   --   --   --  <5  ALKPHOS  --   --   --   --   --   --   --   --    --   --   --   --   --   --   --  118*  BILITOT  --   --   --   --   --   --   --   --   --   --   --   --   --   --   --  1.1  PROT  --   --   --   --   --   --   --   --   --   --   --   --   --   --   --  7.8  ALBUMIN  --   --   --   --   --   --   --   --   --   --   --   --   --   --   --  2.7*  APTT  --   --   --  41*  --   --   --   --   --   --   --   --   --   --   --   --   INR  --   --   --  1.23  --   --   --   --   --   --   --   --   --   --   --   --   LATICACIDVEN  --   --   --   --  2.6*  --   --   --   --  1.4  --   --   --   --   --   --   TROPONINI <0.30  --   --   --   --   --   --   --   --   --   --   --   --   --   --   --   PHART  --   --   --   --   --   --   --  7.470*  --   --   --   --  7.424  --  7.471*  --   PCO2ART  --   --   --   --   --   --   --  58.5*  --   --   --   --  51.3*  --  35.3  --   PO2ART  --   --   --   --   --   --   --  56.6*  --   --   --   --  95.0  --  71.0*  --   < > = values in this interval not displayed.  Recent Labs Lab 10/20/12 0811 10/20/12 1144 10/20/12 1639 10/20/12 2118 10/21/12 0758  GLUCAP 147* 154* 155* 166* 136*   CXR:  8/28, increasing edema  ASSESSMENT / PLAN:  PULMONARY A:  COPD without evidence of exacerbation.  Chronic hypercarbic / hypoxemic respiratory failure (CO2 at baseline).  Improved clinically' Rising edema 8/28 P:   KVO [pcxr in am  O2 support Upright position  CARDIOVASCULAR A: Chronic diastolic CHF, exacerbation no resolved.  Severe AS R/o endocarditis Preload dependence from AS Early edema on pcxr 8/28 P:  Echo reviewed, will need tee Tele No lasix  RENAL A:  Acute renal failure (prerenal?).  Contraction alkalosis.  Hypovolemia.  Hypokalemia, hyponatremia P:  Trend BMP in am  salien to kvo k supp  GASTROINTESTINAL A:  Abdominal pain.  History of hernia, cirrhosis on CT, n/v, r/o ileus P:   May need NGT to suction kub now zofran Npo lft in am  ppi May need reglan attempt,  assess qtc prior  HEMATOLOGIC A:  cirhosis P:  Trend CBC in am  Heparin for DVT Px  INFECTIOUS A:  R/o picc line sepsis source, r/o endocarditis, LE cellulitis (not the main source ), mild UTI P:   STAT vanc, cefipime ID consult appreciated BC x 3 follow for growth Echo, need TEE  ENDOCRINE  A:  DM.  Hyperglycemia.  Hypothyroidism. P:   SSI  Synthroid repeat cortisol-not seen  NEUROLOGIC A:  Encephalopathy (acute on chronic).  Appeared to be on high doses opioids / benzodiazepines preadmission. No evidence of withdrawal  at this time. Sepsis encephalopathy likely P:   Continue to hold Xanax, MS Contin, MS IR Likely will need to add narc to avoid wd in am  Continue Prozac, dc if na does not rise  To sdu, back to IMTS as primary for am   Nelda Bucks., MD Pulmonary and Critical Care Medicine [facility] Pager: [phone]  10/21/2012, 8:41 AM

## 2012-10-21 NOTE — Significant Event (Signed)
Pt continues to c/o nausea.  Will give dose of phenergan 12.5 mg IV x one and monitor.  Coralyn Helling, MD 10/21/2012, 12:49 AM

## 2012-10-22 ENCOUNTER — Inpatient Hospital Stay (HOSPITAL_COMMUNITY): Payer: Medicare Other

## 2012-10-22 LAB — BASIC METABOLIC PANEL
BUN: 19 mg/dL (ref 6–23)
Calcium: 9.3 mg/dL (ref 8.4–10.5)
Chloride: 96 mEq/L (ref 96–112)
Creatinine, Ser: 0.68 mg/dL (ref 0.50–1.10)
GFR calc Af Amer: >90 mL/min (ref >90)

## 2012-10-22 LAB — GLUCOSE, CAPILLARY
Glucose-Capillary: 140 mg/dL — ABNORMAL HIGH (ref 70–99)
Glucose-Capillary: 147 mg/dL — ABNORMAL HIGH (ref 70–99)

## 2012-10-22 LAB — MAGNESIUM: Magnesium: 2.1 mg/dL (ref 1.5–2.5)

## 2012-10-22 MED ORDER — LACTULOSE 10 GM/15ML PO SOLN
20.0000 g | Freq: Every day | ORAL | Status: DC | PRN
  Administered 2012-10-22: 20 g via ORAL
  Filled 2012-10-22: qty 30

## 2012-10-22 MED ORDER — FENTANYL CITRATE 0.05 MG/ML IJ SOLN
25.0000 ug | Freq: Once | INTRAMUSCULAR | Status: AC
  Administered 2012-10-22: 25 ug via INTRAVENOUS
  Filled 2012-10-22: qty 2

## 2012-10-22 MED ORDER — MIDAZOLAM BOLUS VIA INFUSION
2.0000 mg | Freq: Once | INTRAVENOUS | Status: DC
Start: 2012-10-22 — End: 2012-10-22

## 2012-10-22 MED ORDER — SODIUM CHLORIDE 0.9 % IV SOLN: INTRAVENOUS | Status: DC

## 2012-10-22 MED ORDER — GADOBENATE DIMEGLUMINE 529 MG/ML IV SOLN
20.0000 mL | Freq: Once | INTRAVENOUS | Status: AC | PRN
  Administered 2012-10-22: 20 mL via INTRAVENOUS

## 2012-10-22 MED ORDER — MIDAZOLAM HCL 2 MG/2ML IJ SOLN
4.0000 mg | Freq: Once | INTRAMUSCULAR | Status: DC
  Filled 2012-10-22: qty 4
  Filled 2012-10-22: qty 2

## 2012-10-22 MED ORDER — MIDAZOLAM HCL 2 MG/2ML IJ SOLN
2.0000 mg | Freq: Once | INTRAMUSCULAR | Status: AC
  Administered 2012-10-22: 2 mg via INTRAVENOUS

## 2012-10-22 MED ORDER — FENTANYL BOLUS VIA INFUSION
25.0000 ug | Freq: Once | INTRAVENOUS | Status: DC
  Filled 2012-10-22: qty 25

## 2012-10-22 MED ORDER — BLISTEX EX OINT
TOPICAL_OINTMENT | CUTANEOUS | Status: DC | PRN
  Filled 2012-10-22 (×2): qty 10

## 2012-10-22 NOTE — Progress Notes (Addendum)
Primary Physician: [name], MD Primary Cardiologist: Eden Emms. Also seen 02/2012 by Cooper/Owen in TAVR clinic  Chief Complaint: SOB, a/c LEE,  weight gain 20-30lb   HF Rounding NOTE    HPI: Ms. Plaut is a 67 y/o F with morbid obesity, moderate to severe AS, spinal stenosis (uses mostly wheelchair), COPD on home O2, and chronic pain requiring narcotics admitted with recurrent volume overload and resp distress. Getting IV abx due to recent bacteremia and LE cellulitis.   She has been diuresing with lasix drip and Metolazone. Diamox added due to alkalosis. On 8/26 she was transferred to stepdown and then ICU due to lethargy and hypotension. All diuretics stopped. Weight 272->246>236>239>240 pounds .   Improving slowly. Bicarb and potassium better. Weight up slightly.   Inpatient Medications:  . antiseptic oral rinse  15 mL Mouth Rinse BID  . ceFEPime (MAXIPIME) IV  1 g Intravenous Q8H  . FLUoxetine  20 mg Oral Daily  . heparin  5,000 Units Subcutaneous Q8H  . hydrocerin  1 application Topical Daily  . insulin aspart  0-15 Units Subcutaneous TID WC  . insulin aspart  0-5 Units Subcutaneous QHS  . insulin aspart  4 Units Subcutaneous TID WC  . insulin glargine  12 Units Subcutaneous QHS  . levothyroxine  137 mcg Oral QAC breakfast  . metoCLOPramide (REGLAN) injection  5 mg Intravenous Q12H   . sodium chloride 10 mL/hr at 10/21/12 1500    Allergies:  Allergies  Allergen Reactions  . Latex     REACTION: itch  . Penicillins     REACTION: hives - tolerates Ancef    Physical Exam: Blood pressure 113/39, pulse 86, temperature 98.6 F (37 C), temperature source Oral, resp. rate 20, height 5' (1.524 m), weight 117.119 kg (258 lb 3.2 oz), SpO2 96.00%. General: Awake, obese WF in no acute distress. Head: Normocephalic, atraumatic, sclera non-icteric, no xanthomas, nares are without discharge.  Neck: JVD difficult to see due to body habitus. Bilateral carotid bruits,  question radiation from murmur. Lungs: Diminished BS at bases and coarse otherwise. Breathing is unlabored. On 2 liters  Heart: RRR with S1 S2. 2/6 SEM RUSB. S2 markedly depressed Abdomen: Massively obese, non-tender, non-distended with normoactive bowel sounds.  Msk:  Strength and tone appear normal for age. Extremities: No clubbing or cyanosis. Bilateral LE erythematous skin changes and significant R>L with cracking of the skin. RLE with nodular thickening. Trace edema Neuro: Alert and oriented X 3. No facial asymmetry. No focal deficit. Moves all extremities spontaneously. Psych:  Responds to questions appropriately with a pleasant affect. GU: Foley   EKG: Sinus Tach 90-114 Labs: No results found for this basename: CKTOTAL, CKMB, TROPONINI,  in the last 72 hours Lab Results  Component Value Date   WBC 7.8 10/21/2012   HGB 12.0 10/21/2012   HCT 36.1 10/21/2012   MCV 87.6 10/21/2012   PLT 145* 10/21/2012     Recent Labs Lab 10/21/12 0400 10/22/12 0430  NA 131* 130*  K 2.9* 3.5  CL 95* 96  CO2 24 23  BUN 24* 19  CREATININE 0.63 0.68  CALCIUM 8.9 9.3  PROT 7.8  --   BILITOT 1.1  --   ALKPHOS 118*  --   ALT <5  --   AST 45*  --   GLUCOSE 177* 152*    Lab Results  Component Value Date   DDIMER 2.00* 10/13/2012    Radiology/Studies:  Dg Chest 2 View  10/13/2012   CLINICAL DATA:  67 year old female with shortness of breath. Cellulitis.  EXAM: CHEST  2 VIEW  COMPARISON:  09/17/2012 and earlier.  FINDINGS: Right PICC line in place, tip at the level of the lower SVC. Small right pleural effusion new or increased since 09/17/2012. Diffuse increased interstitial opacity also progressed. No pneumothorax. No consolidation. Stable cardiomegaly and mediastinal contours. Stable visualized osseous structures.  IMPRESSION: 1.  Small right pleural effusion is new/ increased.  2. Increased interstitial markings, favor pulmonary interstitial edema. Viral/ atypical infection is less likely.  3.  Chronic cardiomegaly.  4. Right PICC line in place, tip at the lower SVC level.   Electronically Signed   By: Augusto Gamble   On: 10/13/2012 15:55    Ct Angio Chest Pe W/cm &/or Wo Cm 10/14/2012   *RADIOLOGY REPORT*  Clinical Data: Shortness of breath and cough.  Hypoxia.  CT ANGIOGRAPHY CHEST  Technique:  Multidetector CT imaging of the chest using the standard protocol during bolus administration of intravenous contrast. Multiplanar reconstructed images including MIPs were obtained and reviewed to evaluate the vascular anatomy.  Contrast: OMNIPAQUE IOHEXOL 350 MG/ML SOLN  Comparison: Chest x-ray dated 10/13/2012  Findings: There is cardiomegaly.  There is a small loculated right pleural effusion.  There is pulmonary vascular congestion of the origin of the main pulmonary arteries suggestive of pulmonary arterial hypertension.    No pulmonary emboli.   There are a few scattered lymph nodes in the mediastinum and left hilum, and none of which are pathologically enlarged.  There is a 3.1 cm soft tissue mass extending behind the right sternoclavicular joint which probably represents a thyroid nodule.  The patient appears to have had previous thyroid surgery on the right and left.  There is slight splenomegaly.  The patient has hepatomegaly with slight nodularity of the liver contour suggesting cirrhosis.  IMPRESSION: 1.  No pulmonary emboli. 2.  Cardiomegaly with right pleural effusion and pulmonary vascular congestion and probable pulmonary arterial hypertension. 3.  Hepatosplenomegaly.  Nodularity of the liver parenchyma consistent with cirrhosis.   Original Report Authenticated By: Francene Boyers, M.D.      Assessment and Plan:  1. 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 Mod-severe aortic stenosis. EF 70% 3. Morbid obesity 4. Spinal stenosis 5. Chronic pain 6. Severe deconditioning 7. PAH by echo 8. COPD on home O2 9. Recent MSSA bacteremia and R leg  cellulits, abx through 10/24/12 via PICC 10. Hypokalemia 11. Metabolic alkalosis 12. Altered mental status 13. Cellulitis- on Ancef. Blood culture 09/17/12 MSSA 14. Fever  Continues to improve. Will likely need to restart diuretics over the weekend. Will try to arrange TEE for later today at the bedside to evaluate for endocarditis. Will make NPO and contact anesthesia to help with sedation   Would try to limit narcotics as much as possible.   Idalee Foxworthy,MD 8:22 AM

## 2012-10-22 NOTE — Consult Note (Signed)
PULMONARY  / CRITICAL CARE MEDICINE  Name: [name] MRN: [id number] DOB: 20-Oct-1945    ADMISSION DATE:  10/13/2012 CONSULTATION DATE:  10/19/2012  REFERRING MD :  [name] PRIMARY SERVICE:  PCCM  CHIEF COMPLAINT:  Acute encephalopathy  BRIEF PATIENT DESCRIPTION: 67 yo with past medical history of severe AS, diastolic CHF, COPD (on 2 L oxygen) and recent admission for RLE MSSA cellulitis (on Ancef) admitted on 8/20 with increased oxygen requirements and dyspnea.  Agressively diuresed. On 8/26 moved to SDU due to acute encephalopathy and multiple metabolic disturbances. Of note, husband reports frequent swings in level of consciousness since about 6 months ago.  PCCM was consulted.  SIGNIFICANT EVENTS / STUDIES:  8/26  CT abdomen / pelvis >>>cirrhosis, splenomegaly and portal  venous hypertension no evidence of enteric or urinary obstruction. 8/27- Int hypotension, tachypnea, fever 8/27 echo - 70%, Technically difficult study. I doubt any significant change from July,2014 valves 8/28- improved 8/29 mri neg emboli, Scattered periventricular sub cortical T2 hyperintensities  bilaterally. The finding is nonspecific  8/29 tee>>>  LINES / TUBES: PICC preadmission 7/29>>>8/27 Foley 8/26 >>>8/29  CULTURES: 8/27 BC x 3>>>  ANTIBIOTICS: Ancef preadmission >>>8/27 cefipime 8/27>>> vanc 8/27>>>  INTERVAL HISTORY:  No fevers, tee planned  VITAL SIGNS: Temp:  [97.4 F (36.3 C)-98.9 F (37.2 C)] 97.9 F (36.6 C) (08/29 1150) Pulse Rate:  [94-110] 110 (08/29 1124) Resp:  [18-25] 19 (08/29 0800) BP: (151-180)/(45-59) 180/52 mmHg (08/29 1124) SpO2:  [92 %-99 %] 95 % (08/29 1150) Weight:  [108.9 kg (240 lb 1.3 oz)] 108.9 kg (240 lb 1.3 oz) (08/29 0500)  HEMODYNAMICS:   VENTILATOR SETTINGS:    INTAKE / OUTPUT: Intake/Output     08/28 0701 - 08/29 0700 08/29 0701 - 08/30 0700   P.O. 1060 120   I.V. (mL/kg) 305 (2.8) 20 (0.2)   IV Piggyback 850    Total Intake(mL/kg) 2215  (20.3) 140 (1.3)   Urine (mL/kg/hr) 2190 (0.8) 150 (0.2)   Emesis/NG output 50 (0)    Total Output 2240 150   Net -25 -10          PHYSICAL EXAMINATION: General:  Appears chronically ill,awkae, alert Neuro:  Follows commands, appropriate HEENT:  PERRL, dry membranes Cardiovascular:  RRR, sys murmur 4/6 unchanged Lungs:  CTA diminshed Abdomen:  Obese, NT, mild distention today Musculoskeletal:  Moves all extremities, chronic stasis dermatitis LE BL, dry Skin:  Erythema LLE mild, surgical dressing RLE   LABS:  Recent Labs Lab 10/18/12 0523 10/18/12 1205 10/18/12 1845  10/19/12 1300 10/19/12 1710  10/19/12 2225  10/20/12 0606 10/20/12 1034 10/20/12 1841 10/21/12 0329 10/21/12 0400 10/21/12 1841 10/22/12 0430  HGB 12.3  --   --   --   --   --   --   --   --  11.9*  --   --   --  12.0  --   --   WBC 7.9  --   --   --   --   --   --   --   --  9.6  --   --   --  7.8  --   --   PLT 162  --   --   --   --   --   --   --   --  172  --   --   --  145*  --   --   NA 132* 131* 129*  < > 132*  --   --   --   < >  --   --  129*  --  131*  --  130*  K 3.1* 3.2* 3.0*  < > 2.8*  --   --   --   < >  --   --  4.1  --  2.9*  --  3.5  CL 81* 80* 80*  < > 82*  --   --   --   < >  --   --  94*  --  95*  --  96  CO2 44* 44* 42*  < > 43*  --   --   --   < >  --   --  27  --  24  --  23  GLUCOSE 146* 238* 236*  < > 202*  --   --   --   < >  --   --  172*  --  177*  --  152*  BUN 26* 27* 27*  < > 28*  --   --   --   < >  --   --  26*  --  24*  --  19  CREATININE 1.14* 1.28* 1.23*  < > 1.11*  --   --   --   < >  --   --  0.72  --  0.63  --  0.68  CALCIUM 9.3 9.3 9.2  < > 9.4  --   --   --   < >  --   --  8.7  --  8.9  --  9.3  MG 2.4  --  2.3  < >  --   --   < >  --   < >  --   --  2.4  --  2.3 2.2 2.1  PHOS  --   --   --   --  3.4  --   --   --   --   --   --   --   --   --   --   --   AST  --   --   --   --   --   --   --   --   --   --   --   --   --  45*  --   --   ALT  --   --   --   --    --   --   --   --   --   --   --   --   --  <5  --   --   ALKPHOS  --   --   --   --   --   --   --   --   --   --   --   --   --  118*  --   --   BILITOT  --   --   --   --   --   --   --   --   --   --   --   --   --  1.1  --   --   PROT  --   --   --   --   --   --   --   --   --   --   --   --   --  7.8  --   --   ALBUMIN  --   --   --   --   --   --   --   --   --   --   --   --   --  2.7*  --   --   APTT  --  41*  --   --   --   --   --   --   --   --   --   --   --   --   --   --   INR  --  1.23  --   --   --   --   --   --   --   --   --   --   --   --   --   --   LATICACIDVEN  --   --  2.6*  --   --   --   --  1.4  --   --   --   --   --   --   --   --   PHART  --   --   --   --   --  7.470*  --   --   --   --  7.424  --  7.471*  --   --   --   PCO2ART  --   --   --   --   --  58.5*  --   --   --   --  51.3*  --  35.3  --   --   --   PO2ART  --   --   --   --   --  56.6*  --   --   --   --  95.0  --  71.0*  --   --   --   < > = values in this interval not displayed.  Recent Labs Lab 10/21/12 1141 10/21/12 1703 10/21/12 2122 10/22/12 0737 10/22/12 1149  GLUCAP 123* 147* 144* 140* 134*   CXR:  atx  ASSESSMENT / PLAN:  PULMONARY A:  COPD without evidence of exacerbation.  Chronic hypercarbic / hypoxemic respiratory failure (CO2 at baseline).  Improved clinically' Rising edema 8/28 P:   IS Upright position Low threshold restart lasix, low dose  CARDIOVASCULAR A: Chronic diastolic CHF, exacerbation no resolved.  Severe AS R/o endocarditis Preload dependence from AS Early edema on pcxr 8/28 P:  Echo reviewed, will need tee Tele No lasix today Tee at 3 pm, agree  RENAL A:  Acute renal failure (prerenal?).  Contraction alkalosis.  Hypovolemia.  Hypokalemia, hyponatremia P:  bmet in am  No lasix today, even balance goals  GASTROINTESTINAL A:  Abdominal pain.  History of hernia, cirrhosis on CT, n/v resolving with reglan P:   reglan zofran Npo tee, then diet  planned Constipatiopn, lactulose  HEMATOLOGIC A:  cirhosis P:  Heparin for DVT Px  INFECTIOUS A:  R/o picc line sepsis source, r/o endocarditis, LE cellulitis (not the main source ), mild UTI P:   cefipime per ID Today TEE  ENDOCRINE  A:  DM.  Hyperglycemia.  Hypothyroidism. P:   SSI  Synthroid  NEUROLOGIC A:  Encephalopathy (acute on chronic).  Appeared to be on high doses opioids / benzodiazepines preadmission. No evidence of withdrawal at this time. Sepsis encephalopathy likely Component hepatic enceph? P:   Continue to hold Xanax, MS Contin, MS IR Low dose fent started to avoid WD Pall care to see, pain, goals Lactulose, as also constipated, cirhosis on CT  To sdu, back to IMTS as primary Will sign off Pall care consideration   Nelda Bucks., MD Pulmonary and Critical Care Medicine [facility] Pager: [phone]  10/22/2012, 12:37 PM

## 2012-10-22 NOTE — Progress Notes (Signed)
INFECTIOUS DISEASE PROGRESS NOTE  ID: [name] is a 67 y.o. female with  Principal Problem:   Acute exacerbation of CHF (congestive heart failure) Active Problems:   Type I (juvenile type) diabetes mellitus with peripheral circulatory disorders, not stated as uncontrolled(250.71)   Hyperglycemia   Alkalemia   Chronic respiratory failure   Metabolic alkalosis  Subjective: Without complaints  Abtx:  Anti-infectives   Start     Dose/Rate Route Frequency Ordered Stop   10/21/12 2000  ceFEPIme (MAXIPIME) 1 g in dextrose 5 % 50 mL IVPB     1 g 100 mL/hr over 30 Minutes Intravenous Every 8 hours 10/21/12 1730     10/20/12 2330  vancomycin (VANCOCIN) IVPB 1000 mg/200 mL premix  Status:  Discontinued     1,000 mg 200 mL/hr over 60 Minutes Intravenous Every 12 hours 10/20/12 1010 10/21/12 1645   10/20/12 1100  vancomycin (VANCOCIN) 2,000 mg in sodium chloride 0.9 % 500 mL IVPB     2,000 mg 250 mL/hr over 120 Minutes Intravenous  Once 10/20/12 1010 10/20/12 1232   10/20/12 1100  ceFEPIme (MAXIPIME) 1 g in dextrose 5 % 50 mL IVPB  Status:  Discontinued     1 g 100 mL/hr over 30 Minutes Intravenous Every 8 hours 10/20/12 1010 10/21/12 1730   10/19/12 1000  fluconazole (DIFLUCAN) tablet 100 mg  Status:  Discontinued     100 mg Oral Daily 10/18/12 1332 10/18/12 1334   10/19/12 1000  fluconazole (DIFLUCAN) tablet 100 mg     100 mg Oral Daily 10/18/12 1334 10/19/12 1117   10/18/12 1400  ceFAZolin (ANCEF) IVPB 2 g/50 mL premix  Status:  Discontinued     2 g 100 mL/hr over 30 Minutes Intravenous 3 times per day 10/18/12 1331 10/20/12 0952   10/14/12 1400  ceFAZolin (ANCEF) IVPB 2 g/50 mL premix  Status:  Discontinued     2 g 100 mL/hr over 30 Minutes Intravenous 3 times per day 10/14/12 1128 10/18/12 1331   10/14/12 1000  fluconazole (DIFLUCAN) tablet 100 mg  Status:  Discontinued     100 mg Oral Daily 10/13/12 2133 10/18/12 1332   10/13/12 2200  ceFAZolin (ANCEF) IVPB 2 g/50 mL  premix  Status:  Discontinued     2 g 100 mL/hr over 30 Minutes Intravenous 3 times per day 10/13/12 2133 10/14/12 1128      Medications:  Scheduled: . antiseptic oral rinse  15 mL Mouth Rinse BID  . ceFEPime (MAXIPIME) IV  1 g Intravenous Q8H  . FLUoxetine  20 mg Oral Daily  . heparin  5,000 Units Subcutaneous Q8H  . hydrocerin  1 application Topical Daily  . insulin aspart  0-15 Units Subcutaneous TID WC  . insulin aspart  0-5 Units Subcutaneous QHS  . insulin aspart  4 Units Subcutaneous TID WC  . insulin glargine  12 Units Subcutaneous QHS  . levothyroxine  137 mcg Oral QAC breakfast  . metoCLOPramide (REGLAN) injection  5 mg Intravenous Q12H    Objective: Vital signs in last 24 hours: Temp:  [97.4 F (36.3 C)-98.9 F (37.2 C)] 97.4 F (36.3 C) (08/29 0733) Pulse Rate:  [94-110] 100 (08/29 0800) Resp:  [18-25] 19 (08/29 0800) BP: (135-177)/(45-59) 170/51 mmHg (08/29 0733) SpO2:  [92 %-99 %] 98 % (08/29 0733) Weight:  [108.9 kg (240 lb 1.3 oz)] 108.9 kg (240 lb 1.3 oz) (08/29 0500)   General appearance: alert, cooperative and no distress Resp: diminished breath sounds anterior -  bilateral Cardio: regular rate and rhythm and systolic murmur: early systolic 3/6, crescendo at 2nd left intercostal space GI: normal findings: bowel sounds normal and soft, non-tender Extremities: edema 3+  Lab Results  Recent Labs  10/20/12 0606  10/21/12 0400 10/22/12 0430  WBC 9.6  --  7.8  --   HGB 11.9*  --  12.0  --   HCT 36.5  --  36.1  --   NA  --   < > 131* 130*  K  --   < > 2.9* 3.5  CL  --   < > 95* 96  CO2  --   < > 24 23  BUN  --   < > 24* 19  CREATININE  --   < > 0.63 0.68  < > = values in this interval not displayed. Liver Panel  Recent Labs  10/21/12 0400  PROT 7.8  ALBUMIN 2.7*  AST 45*  ALT <5  ALKPHOS 118*  BILITOT 1.1   Sedimentation Rate No results found for this basename: ESRSEDRATE,  in the last 72 hours C-Reactive Protein No results found for  this basename: CRP,  in the last 72 hours  Microbiology: Recent Results (from the past 240 hour(s))  CULTURE, BLOOD (ROUTINE X 2)     Status: None   Collection Time    10/14/12 11:58 AM      Result Value Range Status   Specimen Description BLOOD LEFT ARM   Final   Special Requests BOTTLES DRAWN AEROBIC AND ANAEROBIC 10CC   Final   Culture  Setup Time     Final   Value: 10/14/2012 16:39     Performed at [facility]   Culture     Final   Value: NO GROWTH 5 DAYS     Performed at [facility]   Report Status 10/20/2012 FINAL   Final  CULTURE, BLOOD (ROUTINE X 2)     Status: None   Collection Time    10/14/12 12:05 PM      Result Value Range Status   Specimen Description BLOOD LEFT HAND   Final   Special Requests BOTTLES DRAWN AEROBIC AND ANAEROBIC 10CC   Final   Culture  Setup Time     Final   Value: 10/14/2012 16:39     Performed at [facility]   Culture     Final   Value: NO GROWTH 5 DAYS     Performed at [facility]   Report Status 10/20/2012 FINAL   Final  CULTURE, BLOOD (ROUTINE X 2)     Status: None   Collection Time    10/20/12 10:00 AM      Result Value Range Status   Specimen Description BLOOD LEFT HAND   Final   Special Requests BOTTLES DRAWN AEROBIC ONLY 10CC   Final   Culture  Setup Time     Final   Value: 10/20/2012 23:47     Performed at [facility]   Culture     Final   Value:        BLOOD CULTURE RECEIVED NO GROWTH TO DATE CULTURE WILL BE HELD FOR 5 DAYS BEFORE ISSUING A FINAL NEGATIVE REPORT     Performed at [facility]   Report Status PENDING   Incomplete  CULTURE, BLOOD (ROUTINE X 2)     Status: None   Collection Time    10/20/12 10:10 AM      Result Value Range Status  Specimen Description BLOOD RIGHT HAND   Final   Special Requests BOTTLES DRAWN AEROBIC ONLY 10CC   Final   Culture  Setup Time     Final   Value: 10/20/2012 23:47     Performed at [facility]   Culture     Final   Value:         BLOOD CULTURE RECEIVED NO GROWTH TO DATE CULTURE WILL BE HELD FOR 5 DAYS BEFORE ISSUING A FINAL NEGATIVE REPORT     Performed at [facility]   Report Status PENDING   Incomplete    Studies/Results: [name] Wo Contrast  10/22/2012   *RADIOLOGY REPORT*  Clinical Data: Encephalopathy.  Recent staph aureus bacteremia.  MRI HEAD WITHOUT AND WITH CONTRAST  Technique:  Multiplanar, multiecho pulse sequences of the brain and surrounding structures were obtained according to standard protocol without and with intravenous contrast  Contrast: 20mL MULTIHANCE GADOBENATE DIMEGLUMINE 529 MG/ML IV SOLN  Comparison: CT head without contrast 12/11/2011.  Findings: The fusion and images demonstrate no evidence for acute infarct or focal abnormality.  Scattered periventricular subcortical T2 hyperintensities are present bilaterally.  No significant cortical or confluent subcortical signal abnormality is present.  The postcontrast images are somewhat distorted by patient motion. No pathologic enhancement is present.  Flow is present in the major intracranial arteries.  The globes and orbits are intact.  The paranasal sinuses and mastoid air cells are clear.  IMPRESSION:  1.  Scattered periventricular sub cortical T2 hyperintensities bilaterally. The finding is nonspecific but can be seen in the setting of chronic microvascular ischemia, a demyelinating process such as multiple sclerosis, vasculitis, complicated migraine headaches, or as the sequelae of a prior infectious or inflammatory process. 2.  No acute intracranial abnormality or pathologic enhancement to suggest septic emboli.   Original Report Authenticated By: Marin Roberts, M.D.   Dg Chest Port 1 View  10/22/2012   *RADIOLOGY REPORT*  Clinical Data: Assess edema  PORTABLE CHEST - 1 VIEW  Comparison: 10/21/2012  Findings: There is improved lung aeration.  The irregularly thickened interstitial markings have decreased and more confluent lung  base opacity has improved.  The cardiac silhouette is normal in size.  No mediastinal or hilar masses are appreciated.  IMPRESSION: Improved lung aeration since the previous day's study consistent with improved pulmonary edema.   Original Report Authenticated By: Amie Portland, M.D.   Dg Chest Port 1 View  10/21/2012   *RADIOLOGY REPORT*  Clinical Data: Shortness of breath.  PORTABLE CHEST - 1 VIEW  Comparison: 10/20/2012.  Findings: The right PICC line has been removed.  Persistent low lung volumes with vascular crowding and atelectasis.  There is also vascular congestion and mild interstitial edema.  Probable small pleural effusions.  IMPRESSION:  1.  Removal of right PICC line. 2.  Slight worsening lung aeration in part due to lower lung volumes.  Slight worsening pulmonary edema.   Original Report Authenticated By: Rudie Meyer, M.D.   Dg Abd Portable 1v  10/21/2012   *RADIOLOGY REPORT*  Clinical Data: Abdominal pain  PORTABLE ABDOMEN - 1 VIEW  Comparison: CT scan 10/19/2012  Findings: Moderate colonic stool is noted.  There is mild gaseous distended small bowel loop in mid abdomen.  Ileus or enteritis cannot be excluded. Mild dextroscoliosis and mild degenerative changes lumbar spine.  The study is limited by patient's large body habitus.  IMPRESSION: Mild gaseous distended small bowel loop in mid abdomen.  Ileus or enteritis cannot be excluded.  Original Report Authenticated By: Natasha Mead, M.D.     Assessment/Plan: MSSA Bacteremia 7-25 Encephalopathy  MRI brain does not show septic emboli TEE pending (today) BCx 8-27 (-) Palliative care for goals, pain mgmt?  Total days of antibiotics- since 7-25- plan to stop on 8-31 if studies do not reveal new source.          Johny Sax Infectious Diseases (pager) [phone] [email or web address] 10/22/2012, 11:29 AM  LOS: 9 days

## 2012-10-22 NOTE — Progress Notes (Signed)
Echo Lab  TEE completed.  Lamiya Naas L Keishawna Carranza, RDCS 10/22/2012 4:06 PM

## 2012-10-22 NOTE — Progress Notes (Signed)
Nursing: Patient given 50 mcg IV fentanyl for chronic back pain rated as a 5/10. Discussed with patient at that time about the need to remove the urinary catheter. Patient requested to have foley removed. Internal balloon deflated and 10 ml of clear fluid removed. Urinary catheter removed without difficulty. Patient reported no c/o of pain upon removal. Patient verbalized understanding regarding the need to call nursing staff with the need to void.

## 2012-10-22 NOTE — CV Procedure (Signed)
    TRANSESOPHAGEAL ECHOCARDIOGRAM   NAME:  [name]   MRN: [id number] DOB:  09/24/1945   ADMIT DATE: 10/13/2012  INDICATIONS:   PROCEDURE:   Informed consent was obtained prior to the procedure. The risks, benefits and alternatives for the procedure were discussed and the patient comprehended these risks.  Risks include, but are not limited to, cough, sore throat, vomiting, nausea, somnolence, esophageal and stomach trauma or perforation, bleeding, low blood pressure, aspiration, pneumonia, infection, trauma to the teeth and death.    After a procedural time-out, the patient was given 2 mg versed and 25 mcg fentanyl for moderate sedation.  The oropharynx was anesthetized with cetacaine spray.  The transesophageal probe was inserted in the esophagus and stomach without difficulty and multiple views were obtained.    COMPLICATIONS:    There were no immediate complications.  FINDINGS:  LEFT VENTRICLE: EF = 65-70%. No regional wall motion abnormalities. +LVH with sigmoid septum. LVOT very narrow at 1.6 cm.   RIGHT VENTRICLE: Normal size and function.   LEFT ATRIUM: Dilated  RIGHT ATRIUM: Normal  AORTIC VALVE:  Trileaflet. Severely calcified. Moderate to severe AS. Mean gradient 37mm HG. AVA by planimetry 1.05cm2 . Moderate to severe AI. No vegetation  MITRAL VALVE:    Normal. Mild to moderate MR. No vegetation/   TRICUSPID VALVE: Normal. No TR. No vegetation.  PULMONIC VALVE: Grossly normal. No PI. No vegetation.  INTERATRIAL SEPTUM: No PFO or ASD.  PERICARDIUM: No effusion  DESCENDING AORTA: Moderate to severe plaque.    CONCLUSION:  No TEE evidence of endocarditis.   Truman Hayward 3:51 PM

## 2012-10-22 NOTE — Progress Notes (Signed)
Thank you for consulting the Palliative Medicine Team at [facility] to meet your patient's and family's needs.   The reason that you asked [place] to see your patient is for goals of care discussion  We have scheduled your patient for a meeting: tomorrow, Saturday 8/30 @ 3:00 pm   The Surrogate decision maker is spouse  Lorella Nimrod [phone]  Other family members that need to be present: daughter Dannielle Burn  Your patient is able to participate  Additional Narrative: spoke with patient at bedside who is agreeable to writer contacting her spouse- spoke with Roselyn Doby [phone] who indicated that because of his daughter's disability it is difficult to get to the hospital until the afternoon and requests meeting time when they both can be present- meeting scheduled as noted above -Saturday @ 3:00 pm    Valente David, RN 10/22/2012, 5:15 PM Palliative Medicine Team RN Liaison [phone]

## 2012-10-22 NOTE — Progress Notes (Addendum)
Subjective: [name] was transferred from ICU today and says she is feeling better.  She denies SOB/CP, N/V.  Per the nurse, her foley was removed this morning and since then she has had little urine output.  Bladder scan showed 686cc and in and out cath this afternoon returned 1000cc.  The patient reports lower abdominal pain and burning with urinatinon. Urinalysis is pending.   Patient with tachy (low 110-120s) and frequent PACs on telemetry today.  Objective: Vital signs in last 24 hours: Filed Vitals:   10/22/12 1800 10/22/12 1830 10/22/12 1918 10/22/12 2000  BP: 182/62 183/54 187/39   Pulse: 108 119    Temp:   97.6 F (36.4 C)   TempSrc:   Oral   Resp: 20 21 21 18   Height:      Weight:      SpO2: 98% 97%  96%   Weight change: 0.1 kg (3.5 oz)  Intake/Output Summary (Last 24 hours) at 10/22/12 2039 Last data filed at 10/22/12 2000  Gross per 24 hour  Intake    895 ml  Output   2125 ml  Net  -1230 ml   General: resting in bed in NAD, answering questions appropriately HEENT: PERRL, no scleral icterus Cardiac: irregular rhythm, grade 3 systolic murmur, peripheral pulses intact Pulm: clear to auscultation bilaterally, moving normal volumes of air, on 5 L via [place] Abd: soft, nondistended, BS present, + lower abdominal/suprapubic tenderness Ext: warm and well perfused, 3+ B/L lower extremity edema Neuro: alert and oriented X3  Lab Results: Basic Metabolic Panel:  Recent Labs Lab 10/19/12 1300  10/21/12 0400 10/21/12 1841 10/22/12 0430  NA 132*  < > 131*  --  130*  K 2.8*  < > 2.9*  --  3.5  CL 82*  < > 95*  --  96  CO2 43*  < > 24  --  23  GLUCOSE 202*  < > 177*  --  152*  BUN 28*  < > 24*  --  19  CREATININE 1.11*  < > 0.63  --  0.68  CALCIUM 9.4  < > 8.9  --  9.3  MG  --   < > 2.3 2.2 2.1  PHOS 3.4  --   --   --   --   < > = values in this interval not displayed. Liver Function Tests:  Recent Labs Lab 10/21/12 0400  AST 45*  ALT <5  ALKPHOS 118*    BILITOT 1.1  PROT 7.8  ALBUMIN 2.7*   CBC:  Recent Labs Lab 10/20/12 0606 10/21/12 0400  WBC 9.6 7.8  NEUTROABS 6.5 6.3  HGB 11.9* 12.0  HCT 36.5 36.1  MCV 90.3 87.6  PLT 172 145*   CBG:  Recent Labs Lab 10/21/12 1141 10/21/12 1703 10/21/12 2122 10/22/12 0737 10/22/12 1149 10/22/12 1629  GLUCAP 123* 147* 144* 140* 134* 143*   Coagulation:  Recent Labs Lab 10/18/12 1205  LABPROT 15.2  INR 1.23   Anemia Panel:  Recent Labs Lab 10/18/12 0523  VITAMINB12 718  FOLATE 17.1  FERRITIN 149  TIBC 422  IRON 50  RETICCTPCT 4.7*   Urine Drug Screen: Drugs of Abuse     Component Value Date/Time   LABOPIA NEGATIVE 01/15/2009 0117   COCAINSCRNUR NEGATIVE 01/15/2009 0117   LABBENZ  Value: POSITIVE (NOTE) Sent for confirmatory testing Result repeated and verified.* 01/15/2009 0117   AMPHETMU NEGATIVE 01/15/2009 0117    Urinalysis:  Recent Labs Lab 10/19/12 1156 10/20/12  0816  COLORURINE YELLOW YELLOW  LABSPEC 1.009 1.017  PHURINE 7.5 8.5*  GLUCOSEU NEGATIVE NEGATIVE  HGBUR NEGATIVE SMALL*  BILIRUBINUR NEGATIVE NEGATIVE  KETONESUR NEGATIVE 15*  PROTEINUR NEGATIVE NEGATIVE  UROBILINOGEN 1.0 1.0  NITRITE NEGATIVE NEGATIVE  LEUKOCYTESUR SMALL* LARGE*   Micro Results: Recent Results (from the past 240 hour(s))  CULTURE, BLOOD (ROUTINE X 2)     Status: None   Collection Time    10/14/12 11:58 AM      Result Value Range Status   Specimen Description BLOOD LEFT ARM   Final   Special Requests BOTTLES DRAWN AEROBIC AND ANAEROBIC 10CC   Final   Culture  Setup Time     Final   Value: 10/14/2012 16:39     Performed at [facility]   Culture     Final   Value: NO GROWTH 5 DAYS     Performed at [facility]   Report Status 10/20/2012 FINAL   Final  CULTURE, BLOOD (ROUTINE X 2)     Status: None   Collection Time    10/14/12 12:05 PM      Result Value Range Status   Specimen Description BLOOD LEFT HAND   Final   Special Requests BOTTLES  DRAWN AEROBIC AND ANAEROBIC 10CC   Final   Culture  Setup Time     Final   Value: 10/14/2012 16:39     Performed at [facility]   Culture     Final   Value: NO GROWTH 5 DAYS     Performed at [facility]   Report Status 10/20/2012 FINAL   Final  CULTURE, BLOOD (ROUTINE X 2)     Status: None   Collection Time    10/20/12 10:00 AM      Result Value Range Status   Specimen Description BLOOD LEFT HAND   Final   Special Requests BOTTLES DRAWN AEROBIC ONLY 10CC   Final   Culture  Setup Time     Final   Value: 10/20/2012 23:47     Performed at [facility]   Culture     Final   Value:        BLOOD CULTURE RECEIVED NO GROWTH TO DATE CULTURE WILL BE HELD FOR 5 DAYS BEFORE ISSUING A FINAL NEGATIVE REPORT     Performed at [facility]   Report Status PENDING   Incomplete  CULTURE, BLOOD (ROUTINE X 2)     Status: None   Collection Time    10/20/12 10:10 AM      Result Value Range Status   Specimen Description BLOOD RIGHT HAND   Final   Special Requests BOTTLES DRAWN AEROBIC ONLY 10CC   Final   Culture  Setup Time     Final   Value: 10/20/2012 23:47     Performed at [facility]   Culture     Final   Value:        BLOOD CULTURE RECEIVED NO GROWTH TO DATE CULTURE WILL BE HELD FOR 5 DAYS BEFORE ISSUING A FINAL NEGATIVE REPORT     Performed at [facility]   Report Status PENDING   Incomplete   Studies/Results: Mr [name] Wo Contrast  10/22/2012   *RADIOLOGY REPORT*  Clinical Data: Encephalopathy.  Recent staph aureus bacteremia.  MRI HEAD WITHOUT AND WITH CONTRAST  Technique:  Multiplanar, multiecho pulse sequences of the brain and surrounding structures were obtained according to standard protocol without and with intravenous  contrast  Contrast: 20mL MULTIHANCE GADOBENATE DIMEGLUMINE 529 MG/ML IV SOLN  Comparison: CT head without contrast 12/11/2011.  Findings: The fusion and images demonstrate no evidence for acute infarct or focal abnormality.   Scattered periventricular subcortical T2 hyperintensities are present bilaterally.  No significant cortical or confluent subcortical signal abnormality is present.  The postcontrast images are somewhat distorted by patient motion. No pathologic enhancement is present.  Flow is present in the major intracranial arteries.  The globes and orbits are intact.  The paranasal sinuses and mastoid air cells are clear.  IMPRESSION:  1.  Scattered periventricular sub cortical T2 hyperintensities bilaterally. The finding is nonspecific but can be seen in the setting of chronic microvascular ischemia, a demyelinating process such as multiple sclerosis, vasculitis, complicated migraine headaches, or as the sequelae of a prior infectious or inflammatory process. 2.  No acute intracranial abnormality or pathologic enhancement to suggest septic emboli.   Original Report Authenticated By: Marin Roberts, M.D.   Dg Chest Port 1 View  10/22/2012   *RADIOLOGY REPORT*  Clinical Data: Assess edema  PORTABLE CHEST - 1 VIEW  Comparison: 10/21/2012  Findings: There is improved lung aeration.  The irregularly thickened interstitial markings have decreased and more confluent lung base opacity has improved.  The cardiac silhouette is normal in size.  No mediastinal or hilar masses are appreciated.  IMPRESSION: Improved lung aeration since the previous day's study consistent with improved pulmonary edema.   Original Report Authenticated By: Amie Portland, M.D.   Dg Chest Port 1 View  10/21/2012   *RADIOLOGY REPORT*  Clinical Data: Shortness of breath.  PORTABLE CHEST - 1 VIEW  Comparison: 10/20/2012.  Findings: The right PICC line has been removed.  Persistent low lung volumes with vascular crowding and atelectasis.  There is also vascular congestion and mild interstitial edema.  Probable small pleural effusions.  IMPRESSION:  1.  Removal of right PICC line. 2.  Slight worsening lung aeration in part due to lower lung volumes.  Slight  worsening pulmonary edema.   Original Report Authenticated By: Rudie Meyer, M.D.   Dg Abd Portable 1v  10/21/2012   *RADIOLOGY REPORT*  Clinical Data: Abdominal pain  PORTABLE ABDOMEN - 1 VIEW  Comparison: CT scan 10/19/2012  Findings: Moderate colonic stool is noted.  There is mild gaseous distended small bowel loop in mid abdomen.  Ileus or enteritis cannot be excluded. Mild dextroscoliosis and mild degenerative changes lumbar spine.  The study is limited by patient's large body habitus.  IMPRESSION: Mild gaseous distended small bowel loop in mid abdomen.  Ileus or enteritis cannot be excluded.   Original Report Authenticated By: Natasha Mead, M.D.   Medications: I have reviewed the patient's current medications. Scheduled Meds: . antiseptic oral rinse  15 mL Mouth Rinse BID  . ceFEPime (MAXIPIME) IV  1 g Intravenous Q8H  . FLUoxetine  20 mg Oral Daily  . heparin  5,000 Units Subcutaneous Q8H  . hydrocerin  1 application Topical Daily  . insulin aspart  0-15 Units Subcutaneous TID WC  . insulin aspart  0-5 Units Subcutaneous QHS  . insulin aspart  4 Units Subcutaneous TID WC  . insulin glargine  12 Units Subcutaneous QHS  . levothyroxine  137 mcg Oral QAC breakfast  . metoCLOPramide (REGLAN) injection  5 mg Intravenous Q12H  . midazolam  4 mg Intravenous Once   Continuous Infusions: . sodium chloride Stopped (10/22/12 0925)  . sodium chloride 10 mL/hr at 10/22/12 1230   PRN Meds:.albuterol, fentaNYL,  lactulose, lip balm, ondansetron (ZOFRAN) IV  Assessment/Plan: Mrs. DEVONIA FARRO is a 67 y.o. female PMH moderate aortic stenosis, diastolic CHF (EF 16-10%), COPD (on 2L home O2), chronic venous insufficiency, recent admission from 7/25-7/31/14 for RLE cellulitis complicated by MSSA bacteremia, receiving Ancef x 6 weeks through a PICC, who presents with shortness of breath and increased oxygen dependency x1 week.    #Acute encephalopathy -  She was transferred to [facility] care on 10/19/12  for acute encephalopathy and metabolic disturbances.  08/28/ blood gas:  pH 7.471, pCO2- 35.3, pO2- 71.0, Bicarb 25.8.  She is AAO x3 today.   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  Normal magnesium levels. K is 3.5 today. - AM CMP/Mg and replete as necessary  #Acute on Chronic Multifactorial Diastolic CHF exacerbation - Acute on chronic diastolic heart failure is the most likely cause of dyspnea in this patient with a 20-30lb weight gain in ~3 weeks, signs of fluid overload on exam including crackles and LE edema, CXR showing pulmonary interstitial edema, and an increased pro-BNP=694. Echo on 09/19/12 showed moderate LVH, hyperdynamic systolic function with EF 70-75%, no wall motion abnormalities, moderate aortic stenosis 2/2 calcified annulus, not candidate of AVR.  - Monitor on telemetry  - per cardiology, will most likely need to restart diuretics over the weekend - Candidate for AVR or TAVR if adequate weight loss  - Monitor BMP, Mg BID  - Continue O2 via [place]. Currently on 5. Goal 92-96%.  - Daily weights - 2lb wt loss since yesterday, 272 --> 240lb since admission  - Strict I/Os - down 18L loss since admission, foley removed this morning - Cardiologist Dr. Eden Emms  Orthostatic Hypotension - most likely due to hypovolemia due to diuresis  - Orthostatic VS - normal  - unable to apply TED due to significant LE swelling and erythemas.  - Per Cardiology - hold metoprolol for now   #Urinary retention and UTI - pt foley d/c this AM and since then she has had little output.  Bladder scan showed 686cc.  In and out cath returned 1L.   - urine culture pending - patient is on cefepime  #Metabolic Alkalosis -  contraction alkalosis due to diuretic use vs chronic hypoxic hypercapnia due to COPD  - Monitor bicarb   - Monitor for respiratory depression   #Vaginal bleeding - resolved, possibly blood from foley catheter, however physical examination revealed mild blood in vaginal vault. She is s/p hysterectomy.  -Consulted Dr. Erin Fulling over phone who suggested that due to no current active bleeding and difficulty in examination in hospital setting and resolution of bleeding to follow-up as outpatient with her or with her OBGYN  -PT normal, PTT prolonged (past history of prolonged PTT)  -UA 8/26 - no blood, small LE   #RLE cellulitis with MSSA bacteremia - Improving. On 6 weeks of IV abx to cover for endocarditis.  - Continue home Ancef via PICC, started 7/31 for 6 weeks, until 8/31  - home MS Contin - held - Obtain blood cultures - no growth to date  - Xray R foot 7/25 - diffuse soft tissue prominence suspicious for cellulitis  - Right LE Duplex US 7/27 -  no evidence of DVT, SVT, or baker's cyst  - LE Arterial Duplex 07/19/2010 - moderate ABI, aortic iliac disease  - Wound care consult - 8/22 No open wound visible. Applied foam dressing and secured with kerlex to absorb drainage and protect from further injury.  -PT consult - recommend home health PT and wide 3n1  - TEE on 08/29 - no evidence of endocarditis  #DM2 - controlled, on metformin at home  - HbA1C - 6.7  -CBG AC and QHS  - Lantus 12U at bedtime , Novolog 4U  - Nutrition consult - Obese Class III - no further nutrition intervention   #COPD on home O2 2L - PFTs 03/01/2012 shows moderate obstructive lung defect (FEV1/FVC 63%, RV 126%) and severely decreased diffusion capacity (DLCO 49%), no response to bronchodilators. On home oxygen. Unknown baseline O2 sat.  - Goal O2 sat 92-96%  - Will monitor for signs/symptoms of hypercapnia  - incentive spirometry, upright position  #Hypothyroidism - S/p thyroidectomy. Last TSH 0.153 in 11/2011.  3.1cm soft tissue mass behind Aeryn joint possible thyroid nodule  - TSH WNL  - Continue home synthroid  -will need outpatient thyroid US  - lactulose as needed for constipation   Normocytic Anemia - due to chronic disease (hypothyroidism) vs iron deficiency vs thalassemia vs GI blood loss  -improving, Hgb 12.0 -Obtain anemia panel - iron, ferritin, TIBC, vit B12, folate normal, iron sat low  -Blood smear with polychromasia   Thrombocytopenia -chronic since 12/13/11. Suspect due to splenomegaly. In 2012 [place] abd with normal splenomegaly. On 10/13/12 CT chest, slight splenomegaly. Other possibilties include medication induced, ITP, HIT syndrome, pseuothrombocytopenia, and bone marrow suppression  - improving, 145  -monitor for bleeding   #Elevated d-dimer - Her Wells' score is 3.5, earning points for clinical signs of DVT (asymmetric LE edema, erythema), PE being a likely diagnosis, and immobilization for at least 3 days. This places her in a high risk group with 40.6% chance of PE in an ED population. We will therefore proceed to definitive imaging. Cr 0.70.  -CTA chest - No PE  -CT findings: Small loculated R pleural effusion, probable PAH, hepatosplenomegaly, nodulary of the liver parenchyma c/w cirrhosis   #Chest tightness -resolved, x3weeks, improved after receiving Lasix in the ED. Likely 2/2 dyspnea and pulmonary edema. EKG WNL, but important to rule out coronary ischemia in this patient with significant co-morbidities including obesity, diabetes, heart failure.   - Trending troponins q6h x3 - neg   #Depression - Stable.  - Continue prozac   #Diet - Heart healthy   #DVT PPX - subq Heparin   Dispo: Disposition is deferred at this time, awaiting improvement of current medical problems.  Anticipated discharge in approximately 2-3 day(s).   The patient does have a current PCP Andrey Campanile Elizebeth Koller, MD) and does need an [facility] hospital follow-up appointment after discharge.  The patient  does not have transportation limitations that hinder transportation to clinic appointments.  .Services Needed at time of discharge: Y = Yes, Blank = No PT:   OT:   RN:   Equipment:   Other:     LOS: 9 days   Evelena Peat, DO 10/22/2012, 8:39 PM

## 2012-10-22 NOTE — Progress Notes (Signed)
Dr. Vassie Loll notified of pt having difficulty voiding (is voiding small amounts every 20-30 minutes) and bladder scan shows 686 cc post void.  Orders received.  Will continue to monitor pt closely.

## 2012-10-23 DIAGNOSIS — Z515 Encounter for palliative care: Secondary | ICD-10-CM

## 2012-10-23 DIAGNOSIS — M48061 Spinal stenosis, lumbar region without neurogenic claudication: Secondary | ICD-10-CM

## 2012-10-23 DIAGNOSIS — F329 Major depressive disorder, single episode, unspecified: Secondary | ICD-10-CM

## 2012-10-23 LAB — BASIC METABOLIC PANEL
BUN: 21 mg/dL (ref 6–23)
Creatinine, Ser: 0.61 mg/dL (ref 0.50–1.10)
GFR calc Af Amer: >90 mL/min (ref >90)
GFR calc non Af Amer: >90 mL/min (ref >90)
Potassium: 4.2 mEq/L (ref 3.5–5.1)

## 2012-10-23 LAB — COMPREHENSIVE METABOLIC PANEL
ALT: 9 U/L (ref 0–35)
AST: 53 U/L — ABNORMAL HIGH (ref 0–37)
Alkaline Phosphatase: 127 U/L — ABNORMAL HIGH (ref 39–117)
CO2: 21 mEq/L (ref 19–32)
Calcium: 9.4 mg/dL (ref 8.4–10.5)
Chloride: 97 mEq/L (ref 96–112)
GFR calc Af Amer: >90 mL/min (ref >90)
GFR calc non Af Amer: >90 mL/min (ref >90)
Glucose, Bld: 141 mg/dL — ABNORMAL HIGH (ref 70–99)
Potassium: 3.2 mEq/L — ABNORMAL LOW (ref 3.5–5.1)
Sodium: 131 mEq/L — ABNORMAL LOW (ref 135–145)
Total Bilirubin: 1.2 mg/dL (ref 0.3–1.2)

## 2012-10-23 LAB — CBC WITH DIFFERENTIAL/PLATELET
Basophils Absolute: 0 10*3/uL (ref 0.0–0.1)
Basophils Relative: 0 % (ref 0–1)
Eosinophils Relative: 1 % (ref 0–5)
HCT: 41.1 % (ref 36.0–46.0)
Lymphocytes Relative: 15 % (ref 12–46)
MCHC: 34.5 g/dL (ref 30.0–36.0)
Monocytes Absolute: 1.1 10*3/uL — ABNORMAL HIGH (ref 0.1–1.0)
Neutro Abs: 10.4 10*3/uL — ABNORMAL HIGH (ref 1.7–7.7)
Platelets: 156 10*3/uL (ref 150–400)
RDW: 15.4 % (ref 11.5–15.5)
WBC: 13.6 10*3/uL — ABNORMAL HIGH (ref 4.0–10.5)

## 2012-10-23 LAB — URINE CULTURE: Culture: NO GROWTH

## 2012-10-23 LAB — URINE MICROSCOPIC-ADD ON

## 2012-10-23 LAB — GLUCOSE, CAPILLARY: Glucose-Capillary: 143 mg/dL — ABNORMAL HIGH (ref 70–99)

## 2012-10-23 LAB — MAGNESIUM: Magnesium: 2.1 mg/dL (ref 1.5–2.5)

## 2012-10-23 LAB — URINALYSIS, ROUTINE W REFLEX MICROSCOPIC
Bilirubin Urine: NEGATIVE
Hgb urine dipstick: NEGATIVE
Nitrite: NEGATIVE
Protein, ur: NEGATIVE mg/dL
Urobilinogen, UA: 2 mg/dL — ABNORMAL HIGH (ref 0.0–1.0)

## 2012-10-23 LAB — LACTIC ACID, PLASMA: Lactic Acid, Venous: 1.9 mmol/L (ref 0.5–2.2)

## 2012-10-23 MED ORDER — ALPRAZOLAM 0.5 MG PO TABS
0.5000 mg | ORAL_TABLET | Freq: Every evening | ORAL | Status: DC | PRN
  Administered 2012-10-24: 0.5 mg via ORAL
  Filled 2012-10-23: qty 1

## 2012-10-23 MED ORDER — OXYCODONE HCL 5 MG PO TABS
5.0000 mg | ORAL_TABLET | Freq: Four times a day (QID) | ORAL | Status: DC
  Administered 2012-10-23 – 2012-10-28 (×20): 5 mg via ORAL
  Filled 2012-10-23 (×21): qty 1

## 2012-10-23 MED ORDER — LABETALOL HCL 5 MG/ML IV SOLN
20.0000 mg | Freq: Once | INTRAVENOUS | Status: AC
  Administered 2012-10-23: 15 mg via INTRAVENOUS
  Filled 2012-10-23: qty 4

## 2012-10-23 MED ORDER — FENTANYL CITRATE 0.05 MG/ML IJ SOLN: 50.0000 ug | Freq: Three times a day (TID) | INTRAMUSCULAR | Status: DC | PRN

## 2012-10-23 MED ORDER — DICLOFENAC EPOLAMINE 1.3 % TD PTCH
1.0000 | MEDICATED_PATCH | Freq: Two times a day (BID) | TRANSDERMAL | Status: DC
  Administered 2012-10-23 – 2012-10-28 (×10): 1 via TRANSDERMAL
  Filled 2012-10-23 (×11): qty 1

## 2012-10-23 MED ORDER — OXYCODONE HCL 5 MG PO TABS
5.0000 mg | ORAL_TABLET | ORAL | Status: DC | PRN
  Administered 2012-10-24 – 2012-10-28 (×6): 5 mg via ORAL
  Filled 2012-10-23 (×5): qty 1

## 2012-10-23 MED ORDER — LABETALOL HCL 5 MG/ML IV SOLN
INTRAVENOUS | Status: AC
  Filled 2012-10-23: qty 4

## 2012-10-23 MED ORDER — POTASSIUM CHLORIDE CRYS ER 20 MEQ PO TBCR: 40.0000 meq | EXTENDED_RELEASE_TABLET | Freq: Once | ORAL | Status: DC

## 2012-10-23 MED ORDER — MAGIC MOUTHWASH
5.0000 mL | Freq: Three times a day (TID) | ORAL | Status: DC
  Administered 2012-10-23 – 2012-10-28 (×17): 5 mL via ORAL
  Filled 2012-10-23 (×18): qty 5

## 2012-10-23 MED ORDER — PREGABALIN 25 MG PO CAPS
25.0000 mg | ORAL_CAPSULE | Freq: Every day | ORAL | Status: DC
  Administered 2012-10-23 – 2012-10-27 (×5): 25 mg via ORAL
  Filled 2012-10-23 (×5): qty 1

## 2012-10-23 MED ORDER — LACTULOSE 10 GM/15ML PO SOLN
20.0000 g | Freq: Two times a day (BID) | ORAL | Status: DC
  Administered 2012-10-23 – 2012-10-25 (×4): 20 g via ORAL
  Filled 2012-10-23 (×7): qty 30

## 2012-10-23 MED ORDER — MORPHINE SULFATE ER 30 MG PO TBCR
30.0000 mg | EXTENDED_RELEASE_TABLET | Freq: Two times a day (BID) | ORAL | Status: DC
  Administered 2012-10-23: 30 mg via ORAL
  Filled 2012-10-23: qty 1

## 2012-10-23 MED ORDER — POTASSIUM CHLORIDE CRYS ER 20 MEQ PO TBCR
40.0000 meq | EXTENDED_RELEASE_TABLET | Freq: Two times a day (BID) | ORAL | Status: AC
  Administered 2012-10-23 (×2): 40 meq via ORAL
  Filled 2012-10-23: qty 2

## 2012-10-23 MED ORDER — DULOXETINE HCL 30 MG PO CPEP
30.0000 mg | ORAL_CAPSULE | Freq: Every day | ORAL | Status: DC
  Administered 2012-10-23 – 2012-10-28 (×6): 30 mg via ORAL
  Filled 2012-10-23 (×6): qty 1

## 2012-10-23 MED ORDER — POTASSIUM CHLORIDE CRYS ER 20 MEQ PO TBCR
EXTENDED_RELEASE_TABLET | ORAL | Status: AC
  Filled 2012-10-23: qty 2

## 2012-10-23 MED ORDER — ENOXAPARIN SODIUM 40 MG/0.4ML ~~LOC~~ SOLN
40.0000 mg | SUBCUTANEOUS | Status: DC
  Administered 2012-10-23 – 2012-10-27 (×5): 40 mg via SUBCUTANEOUS
  Filled 2012-10-23 (×6): qty 0.4

## 2012-10-23 NOTE — Consult Note (Addendum)
Palliative Medicine Team Consult Note Requested by: Internal Medicine Teaching Service, Dr. Rogelia Boga  [name] is a 67 yo woman with severe AS, COPD, DM, NASH cirrhosis and general debility from obesity and her multiple chronic medical problems. She was admitted with AMS and decompensated HF with AS. She has chronic back pain from Spinal stenosis and multiple prior back surgeries and it has been difficult to manage her pain and symptom burden of her chronic medical problems given her respiratory failure and cardiac issues. PMT consulted to assist with Goals of Care, Symptom Management, and Care Coordination.  Problem List: 1. CHF, secondary to severe aortic stenosis, not an operative candidate 2. PAH, secondary to lung disease 3. Chronic Respiratory Failure 4. Chronic Back Pain/Spinal Stenosis 5. Recent LE Cellulitis with Staph Bacteremia 6. Metabolic derangements/alkylosis 7, Hypotension/Autoomic Dysfunction 8. Type 1 Diabetes, multiple complications/neuropathy 9. Encephalopathy, multifactorial 10. Obesity 11. Depression/Schizophrenia Dx many years ago  16. Abnormal LFT, Biliary Dilation 13. 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 Urinary retention/constipation  Met with [name], her husband and her daughter for goals of care discussion and to work on symptom management.  Summary of Discussion:  1. DNR 2. Full Scope Medical Treatment while maintaining a focus on comfort-seek to attain maximal medical improvement-which may still mean [name] is in a very debilitated state. 3. Aggressive Symptom Management including attention to pain, anxiety, fatigue and lethargy. 4. At the time of disposition family and patient interested in Hospice Care at home if she can transfer from bed to a chair and they will supplement that care with paid in home caregivers if needed in order to keep her out of SNF, she may also be a candidate for CIR. 5. In terms of her back pain which is largely chronic I am  concerned about the progression of degeneration on the MRI and potential relationship to her urinary retention and severe constipation-both of which can be opiate induced but also a sign of further nerve root compromise. I think she would do well with a burst of steroids for that inflammation, pain and also for her energy levels-but I also do not want to make her fluid retention worse and further raise her CBG's and and increase her infection risk. Will hold off for now and see how things go over the next few days.  Recommendations:   DNR order placed.  Scheduled Lactulose BID, check Ammonia, no BM since 8/26  Continue to achieve a dry weight and work on optimizing cardiac regimen  Changed her to a regular diet for comfort and will reflect what she would be consuming at home-she really wants a regular Coke, and while this will not help her achieve her weigh loss goals it may help her get OOB and we can cover with insulin.  Flector patch on her back for anti-inflammatory- very low systemic absorption so should not affect GI or cardiac issues.  Discontinued her morphine since she has evidence of nodular cirrohosis-liver isses may be playing a significant role in her lethargy-morphine metabolites in liver failure can cause sedation/encephalopathy-will change her to oxycodone 5mg  q4 with and extra dose q4 for breakthrough pain.  For neuropathic pain I changed her Prozac to Cymbalta and added a very low dose of PM lyrica which should be very gradually titrated.  Added a prn dose of alprazolam for HS is needed-she may have her sleep wake cycles reversed.  Disposition issues to be determined-will depend on how well she can work with PT-she may need inpatient rehab  if she qualifies prior to d/c home vs. Directly home with hospice and in home caregivers-would need to be discussed in context of her maximal improvement. Will follow.  Overall this is a very realistic and loving family who want to make the  best decisions for the patient-they also understand the seriousness of her condition and for now we decided to take one day at a time and a good plan for comfort and optimal medical management.  Prognosis: <6 months given chronic disease burden and progression/decompensation of her Aortic Stenosis  Time: 70 minutes. Greater than 50%  of this time was spent counseling and coordinating care related to the above assessment and plan.  Will re-meet with family Monday at 3PM to begin discussion about discharge planning based on her improvement over the weekend.

## 2012-10-23 NOTE — Progress Notes (Addendum)
Patient c/o need to void but has been unable to. Bladder scan showed 565 cc. Dr. Darrick Penna notified and orders received to place foley catheter. Foley huddle done prior to inserting foley. 16 French foley placed without difficulty. Immediately got back 375 cc.

## 2012-10-23 NOTE — Progress Notes (Signed)
INFECTIOUS DISEASE PROGRESS NOTE  ID: [name] is a 67 y.o. female with  Principal Problem:   Acute exacerbation of CHF (congestive heart failure) Active Problems:   Type I (juvenile type) diabetes mellitus with peripheral circulatory disorders, not stated as uncontrolled(250.71)   Hyperglycemia   Alkalemia   Chronic respiratory failure   Metabolic alkalosis  Subjective: C/o pain. No BM. Feels "sickly" today.   Abtx:  Anti-infectives   Start     Dose/Rate Route Frequency Ordered Stop   10/21/12 2000  ceFEPIme (MAXIPIME) 1 g in dextrose 5 % 50 mL IVPB     1 g 100 mL/hr over 30 Minutes Intravenous Every 8 hours 10/21/12 1730     10/20/12 2330  vancomycin (VANCOCIN) IVPB 1000 mg/200 mL premix  Status:  Discontinued     1,000 mg 200 mL/hr over 60 Minutes Intravenous Every 12 hours 10/20/12 1010 10/21/12 1645   10/20/12 1100  vancomycin (VANCOCIN) 2,000 mg in sodium chloride 0.9 % 500 mL IVPB     2,000 mg 250 mL/hr over 120 Minutes Intravenous  Once 10/20/12 1010 10/20/12 1232   10/20/12 1100  ceFEPIme (MAXIPIME) 1 g in dextrose 5 % 50 mL IVPB  Status:  Discontinued     1 g 100 mL/hr over 30 Minutes Intravenous Every 8 hours 10/20/12 1010 10/21/12 1730   10/19/12 1000  fluconazole (DIFLUCAN) tablet 100 mg  Status:  Discontinued     100 mg Oral Daily 10/18/12 1332 10/18/12 1334   10/19/12 1000  fluconazole (DIFLUCAN) tablet 100 mg     100 mg Oral Daily 10/18/12 1334 10/19/12 1117   10/18/12 1400  ceFAZolin (ANCEF) IVPB 2 g/50 mL premix  Status:  Discontinued     2 g 100 mL/hr over 30 Minutes Intravenous 3 times per day 10/18/12 1331 10/20/12 0952   10/14/12 1400  ceFAZolin (ANCEF) IVPB 2 g/50 mL premix  Status:  Discontinued     2 g 100 mL/hr over 30 Minutes Intravenous 3 times per day 10/14/12 1128 10/18/12 1331   10/14/12 1000  fluconazole (DIFLUCAN) tablet 100 mg  Status:  Discontinued     100 mg Oral Daily 10/13/12 2133 10/18/12 1332   10/13/12 2200  ceFAZolin  (ANCEF) IVPB 2 g/50 mL premix  Status:  Discontinued     2 g 100 mL/hr over 30 Minutes Intravenous 3 times per day 10/13/12 2133 10/14/12 1128      Medications:  Scheduled: . antiseptic oral rinse  15 mL Mouth Rinse BID  . ceFEPime (MAXIPIME) IV  1 g Intravenous Q8H  . FLUoxetine  20 mg Oral Daily  . heparin  5,000 Units Subcutaneous Q8H  . hydrocerin  1 application Topical Daily  . insulin aspart  0-15 Units Subcutaneous TID WC  . insulin aspart  0-5 Units Subcutaneous QHS  . insulin aspart  4 Units Subcutaneous TID WC  . insulin glargine  12 Units Subcutaneous QHS  . levothyroxine  137 mcg Oral QAC breakfast  . magic mouthwash  5 mL Oral TID  . metoCLOPramide (REGLAN) injection  5 mg Intravenous Q12H  . midazolam  4 mg Intravenous Once  . potassium chloride SA      . potassium chloride  40 mEq Oral BID    Objective: Vital signs in last 24 hours: Temp:  [97.6 F (36.4 C)-98.5 F (36.9 C)] 97.8 F (36.6 C) (08/30 0758) Pulse Rate:  [95-124] 118 (08/30 1057) Resp:  [16-25] 16 (08/30 1057) BP: (147-193)/(38-146) 193/56 mmHg (  08/30 1057) SpO2:  [94 %-98 %] 98 % (08/30 1057) Weight:  [107.6 kg (237 lb 3.4 oz)] 107.6 kg (237 lb 3.4 oz) (08/30 0500)   General appearance: alert, cooperative and no distress Resp: clear to auscultation bilaterally Cardio: regularly irregular rhythm and systolic murmur: early systolic 3/6, crescendo at 2nd left intercostal space GI: normal findings: bowel sounds normal and soft, non-tender Extremities: edema BLE  Lab Results  Recent Labs  10/21/12 0400 10/22/12 0430 10/23/12 0650  WBC 7.8  --  13.6*  HGB 12.0  --  14.2  HCT 36.1  --  41.1  NA 131* 130* 131*  K 2.9* 3.5 3.2*  CL 95* 96 97  CO2 24 23 21   BUN 24* 19 21  CREATININE 0.63 0.68 0.61   Liver Panel  Recent Labs  10/21/12 0400 10/23/12 0650  PROT 7.8 7.9  ALBUMIN 2.7* 2.7*  AST 45* 53*  ALT <5 9  ALKPHOS 118* 127*  BILITOT 1.1 1.2   Sedimentation Rate No results  found for this basename: ESRSEDRATE,  in the last 72 hours C-Reactive Protein No results found for this basename: CRP,  in the last 72 hours  Microbiology: Recent Results (from the past 240 hour(s))  CULTURE, BLOOD (ROUTINE X 2)     Status: None   Collection Time    10/14/12 11:58 AM      Result Value Range Status   Specimen Description BLOOD LEFT ARM   Final   Special Requests BOTTLES DRAWN AEROBIC AND ANAEROBIC 10CC   Final   Culture  Setup Time     Final   Value: 10/14/2012 16:39     Performed at [facility]   Culture     Final   Value: NO GROWTH 5 DAYS     Performed at [facility]   Report Status 10/20/2012 FINAL   Final  CULTURE, BLOOD (ROUTINE X 2)     Status: None   Collection Time    10/14/12 12:05 PM      Result Value Range Status   Specimen Description BLOOD LEFT HAND   Final   Special Requests BOTTLES DRAWN AEROBIC AND ANAEROBIC 10CC   Final   Culture  Setup Time     Final   Value: 10/14/2012 16:39     Performed at [facility]   Culture     Final   Value: NO GROWTH 5 DAYS     Performed at [facility]   Report Status 10/20/2012 FINAL   Final  CULTURE, BLOOD (ROUTINE X 2)     Status: None   Collection Time    10/20/12 10:00 AM      Result Value Range Status   Specimen Description BLOOD LEFT HAND   Final   Special Requests BOTTLES DRAWN AEROBIC ONLY 10CC   Final   Culture  Setup Time     Final   Value: 10/20/2012 23:47     Performed at [facility]   Culture     Final   Value:        BLOOD CULTURE RECEIVED NO GROWTH TO DATE CULTURE WILL BE HELD FOR 5 DAYS BEFORE ISSUING A FINAL NEGATIVE REPORT     Performed at [facility]   Report Status PENDING   Incomplete  CULTURE, BLOOD (ROUTINE X 2)     Status: None   Collection Time    10/20/12 10:10 AM      Result Value Range Status  Specimen Description BLOOD RIGHT HAND   Final   Special Requests BOTTLES DRAWN AEROBIC ONLY 10CC   Final   Culture  Setup Time      Final   Value: 10/20/2012 23:47     Performed at [facility]   Culture     Final   Value:        BLOOD CULTURE RECEIVED NO GROWTH TO DATE CULTURE WILL BE HELD FOR 5 DAYS BEFORE ISSUING A FINAL NEGATIVE REPORT     Performed at [facility]   Report Status PENDING   Incomplete    Studies/Results: [name] Wo Contrast  10/22/2012   *RADIOLOGY REPORT*  Clinical Data: Encephalopathy.  Recent staph aureus bacteremia.  MRI HEAD WITHOUT AND WITH CONTRAST  Technique:  Multiplanar, multiecho pulse sequences of the brain and surrounding structures were obtained according to standard protocol without and with intravenous contrast  Contrast: 20mL MULTIHANCE GADOBENATE DIMEGLUMINE 529 MG/ML IV SOLN  Comparison: CT head without contrast 12/11/2011.  Findings: The fusion and images demonstrate no evidence for acute infarct or focal abnormality.  Scattered periventricular subcortical T2 hyperintensities are present bilaterally.  No significant cortical or confluent subcortical signal abnormality is present.  The postcontrast images are somewhat distorted by patient motion. No pathologic enhancement is present.  Flow is present in the major intracranial arteries.  The globes and orbits are intact.  The paranasal sinuses and mastoid air cells are clear.  IMPRESSION:  1.  Scattered periventricular sub cortical T2 hyperintensities bilaterally. The finding is nonspecific but can be seen in the setting of chronic microvascular ischemia, a demyelinating process such as multiple sclerosis, vasculitis, complicated migraine headaches, or as the sequelae of a prior infectious or inflammatory process. 2.  No acute intracranial abnormality or pathologic enhancement to suggest septic emboli.   Original Report Authenticated By: Marin Roberts, M.D.   Dg Chest Port 1 View  10/22/2012   *RADIOLOGY REPORT*  Clinical Data: Assess edema  PORTABLE CHEST - 1 VIEW  Comparison: 10/21/2012  Findings: There is improved  lung aeration.  The irregularly thickened interstitial markings have decreased and more confluent lung base opacity has improved.  The cardiac silhouette is normal in size.  No mediastinal or hilar masses are appreciated.  IMPRESSION: Improved lung aeration since the previous day's study consistent with improved pulmonary edema.   Original Report Authenticated By: Amie Portland, M.D.     Assessment/Plan:   MSSA Bacteremia 7-25  Encephalopathy  MRI brain does not show septic emboli  TEE no veg BCx 8-27 and 8-21 (-)  Palliative care for goals, pain mgmt today She has a UCx pending, suspect this will be (-) as she was on cefepime when sent.  On antibiotics since 7-25- please to stop on 8-31 Available if questions            Johny Sax Infectious Diseases (pager) [phone] [email or web address] 10/23/2012, 11:07 AM  LOS: 10 days

## 2012-10-23 NOTE — Progress Notes (Signed)
Primary Physician: [name], MD Primary Cardiologist: Eden Emms. Also seen 02/2012 by Cooper/Owen in [facility] clinic  Chief Complaint: SOB, a/c LEE,  weight gain 20-30lb   HF Rounding NOTE    HPI: [name] is a 67 y/o F with morbid obesity, moderate to severe AS, spinal stenosis (uses mostly wheelchair), COPD on home O2, and chronic pain requiring narcotics admitted with recurrent volume overload and resp distress. Getting IV abx due to recent bacteremia and LE cellulitis.   She has been diuresing with lasix drip and Metolazone. Diamox added due to alkalosis. On 8/26 she was transferred to stepdown and then ICU due to lethargy and hypotension. All diuretics stopped. Weight 272->246>236>239>240>236 pounds .   Sitting up in chair. Still very lethargic. Weight stable. Denies dyspnea. Complains of pain on tongue.  TEE yesterday with EF 65-70% moderate to severe AS & AI. No vegetations.   Inpatient Medications:  . antiseptic oral rinse  15 mL Mouth Rinse BID  . ceFEPime (MAXIPIME) IV  1 g Intravenous Q8H  . FLUoxetine  20 mg Oral Daily  . heparin  5,000 Units Subcutaneous Q8H  . hydrocerin  1 application Topical Daily  . insulin aspart  0-15 Units Subcutaneous TID WC  . insulin aspart  0-5 Units Subcutaneous QHS  . insulin aspart  4 Units Subcutaneous TID WC  . insulin glargine  12 Units Subcutaneous QHS  . levothyroxine  137 mcg Oral QAC breakfast  . metoCLOPramide (REGLAN) injection  5 mg Intravenous Q12H  . midazolam  4 mg Intravenous Once   . sodium chloride Stopped (10/22/12 0925)  . sodium chloride 10 mL/hr at 10/22/12 1230    Allergies:  Allergies  Allergen Reactions  . Latex     REACTION: itch  . Penicillins     REACTION: hives - tolerates Ancef   Filed Vitals:   10/23/12 0329 10/23/12 0500 10/23/12 0610 10/23/12 0653  BP: 161/57     Pulse:      Temp: 98.5 F (36.9 C)     TempSrc: Oral     Resp: 20  19 22   Height:      Weight:  107.6 kg (237 lb 3.4 oz)     SpO2: 94%        Physical Exam: General: Lethargic but awakens. Sitting in chair. No distress Head: Normocephalic, atraumatic, sclera non-icteric, no xanthomas, nares are without discharge.  Neck: JVD difficult to see due to body habitus. Bilateral carotid bruits, question radiation from murmur. Lungs: Diminished BS at bases and coarse otherwise. Breathing is unlabored. On 2 liters  Heart: RRR with S1 S2. 2/6 SEM RUSB. S2 markedly depressed Abdomen: Massively obese, non-tender, non-distended with normoactive bowel sounds.  Msk:  Strength and tone appear normal for age. Extremities: No clubbing or cyanosis. Bilateral LE erythematous skin changes and significant R>L with cracking of the skin. RLE with nodular thickening. Trace edema Neuro: Alert and oriented X 3. No facial asymmetry. No focal deficit. Moves all extremities spontaneously. Psych:  Lethargic but awakens GU: Foley   EKG: Sinus Tach 100-120 Labs: No results found for this basename: CKTOTAL, CKMB, TROPONINI,  in the last 72 hours Lab Results  Component Value Date   WBC 7.8 10/21/2012   HGB 12.0 10/21/2012   HCT 36.1 10/21/2012   MCV 87.6 10/21/2012   PLT 145* 10/21/2012     Recent Labs Lab 10/21/12 0400 10/22/12 0430  NA 131* 130*  K 2.9* 3.5  CL 95* 96  CO2 24 23  BUN 24* 19  CREATININE 0.63 0.68  CALCIUM 8.9 9.3  PROT 7.8  --   BILITOT 1.1  --   ALKPHOS 118*  --   ALT <5  --   AST 45*  --   GLUCOSE 177* 152*    Lab Results  Component Value Date   DDIMER 2.00* 10/13/2012    Radiology/Studies:  Dg Chest 2 View  10/13/2012   CLINICAL DATA:  67 year old female with shortness of breath. Cellulitis.  EXAM: CHEST  2 VIEW  COMPARISON:  09/17/2012 and earlier.  FINDINGS: Right PICC line in place, tip at the level of the lower SVC. Small right pleural effusion new or increased since 09/17/2012. Diffuse increased interstitial opacity also progressed. No pneumothorax. No consolidation. Stable cardiomegaly and  mediastinal contours. Stable visualized osseous structures.  IMPRESSION: 1.  Small right pleural effusion is new/ increased.  2. Increased interstitial markings, favor pulmonary interstitial edema. Viral/ atypical infection is less likely.  3. Chronic cardiomegaly.  4. Right PICC line in place, tip at the lower SVC level.   Electronically Signed   By: Augusto Gamble   On: 10/13/2012 15:55    Ct Angio Chest Pe W/cm &/or Wo Cm 10/14/2012   *RADIOLOGY REPORT*  Clinical Data: Shortness of breath and cough.  Hypoxia.  CT ANGIOGRAPHY CHEST  Technique:  Multidetector CT imaging of the chest using the standard protocol during bolus administration of intravenous contrast. Multiplanar reconstructed images including MIPs were obtained and reviewed to evaluate the vascular anatomy.  Contrast: OMNIPAQUE IOHEXOL 350 MG/ML SOLN  Comparison: Chest x-ray dated 10/13/2012  Findings: There is cardiomegaly.  There is a small loculated right pleural effusion.  There is pulmonary vascular congestion of the origin of the main pulmonary arteries suggestive of pulmonary arterial hypertension.    No pulmonary emboli.   There are a few scattered lymph nodes in the mediastinum and left hilum, and none of which are pathologically enlarged.  There is a 3.1 cm soft tissue mass extending behind the right sternoclavicular joint which probably represents a thyroid nodule.  The patient appears to have had previous thyroid surgery on the right and left.  There is slight splenomegaly.  The patient has hepatomegaly with slight nodularity of the liver contour suggesting cirrhosis.  IMPRESSION: 1.  No pulmonary emboli. 2.  Cardiomegaly with right pleural effusion and pulmonary vascular congestion and probable pulmonary arterial hypertension. 3.  Hepatosplenomegaly.  Nodularity of the liver parenchyma consistent with cirrhosis.   Original Report Authenticated By: Francene Boyers, M.D.      Assessment and Plan:  1. Acute on chronic multifactorial  diastolic CHF / acute on chronic respiratory failure 2. Mod-severe aortic stenosis. EF 70% 3. Morbid obesity 4. Spinal stenosis 5. Chronic pain 6. Severe deconditioning 7. PAH by echo 8. COPD on home O2 9. Recent MSSA bacteremia and R leg cellulits, abx through 10/24/12 via PICC 10. Hypokalemia/hyponatremia 11. Metabolic alkalosis 12. Altered mental status 13. Cellulitis- on Ancef. Blood culture 09/17/12 MSSA 14. Fever  Volume status is stable off diuretics. TEE yesterday without vegetation. Remains lethargic  I had long talk with her husband yesterday and told him that with her inability to ambulate, morbid obesity and other com-morbidities that I didn't think she would ever be candidate for valve replacement (open or percutaneous) unless things changed dramatically. I quoted her a 50% two-year mortality rate at best. Would restart diuretics (torsemide 40 bid) when weight starts climbing again (240 or greater).   We will follow at  a distance. Agree with Palliative Care and PT Consult  Truman Hayward 7:37 AM

## 2012-10-23 NOTE — Progress Notes (Signed)
  Date: 10/23/2012  Patient name: [name]  Medical record number: [id number]  Date of birth: [birth year]   This patient has been seen and the plan of care was discussed with the house staff. Please see their note for complete details. I concur with their findings with the following additions/corrections: I discussed [name]'s care with Dr Dierdre Searles and have read Dr Louann Liv progress note. Diuretics per HF team. ABX to be stopped tomorrow per Dr Ninetta Lights. Low dose MS Contin to be resumed. Palliative care consult later today.  Burns Spain, MD 10/23/2012, 2:00 PM

## 2012-10-23 NOTE — Progress Notes (Signed)
Subjective: [name] was transferred from ICU on 10/22/12 and says she is feeling better.  She denies SOB/CP, N/V.  Per the nurse, her foley was removed this morning and since then she has had little urine output.  Bladder scan showed 686cc and in and out cath this afternoon returned 1000cc.  The patient reports lower abdominal pain and burning with urinatinon. Urinalysis is pending.   Patient with tachy (low 110-120s) and frequent PACs on telemetry since 8/29. Patient has htn to 190's SBP.  Objective: Vital signs in last 24 hours: Filed Vitals:   10/23/12 0329 10/23/12 0500 10/23/12 0610 10/23/12 0653  BP: 161/57     Pulse:      Temp: 98.5 F (36.9 C)     TempSrc: Oral     Resp: 20  19 22   Height:      Weight:  237 lb 3.4 oz (107.6 kg)    SpO2: 94%      Weight change: -2 lb 13.9 oz (-1.3 kg)  Intake/Output Summary (Last 24 hours) at 10/23/12 0750 Last data filed at 10/23/12 0600  Gross per 24 hour  Intake    525 ml  Output   2225 ml  Net  -1700 ml   General: resting in bed in NAD, answering questions appropriately HEENT: PERRL, no scleral icterus Cardiac: irregular rhythm, grade 3 systolic murmur, peripheral pulses intact Pulm: clear to auscultation bilaterally, moving normal volumes of air, on 5 L via [place] Abd: soft, nondistended, BS present, + lower abdominal/suprapubic tenderness Ext: warm and well perfused, 3+ B/L lower extremity edema Neuro: alert and oriented X3  Lab Results: Basic Metabolic Panel:  Recent Labs Lab 10/19/12 1300  10/21/12 0400  10/22/12 0430 10/22/12 2200  NA 132*  < > 131*  --  130*  --   K 2.8*  < > 2.9*  --  3.5  --   CL 82*  < > 95*  --  96  --   CO2 43*  < > 24  --  23  --   GLUCOSE 202*  < > 177*  --  152*  --   BUN 28*  < > 24*  --  19  --   CREATININE 1.11*  < > 0.63  --  0.68  --   CALCIUM 9.4  < > 8.9  --  9.3  --   MG  --   < > 2.3  < > 2.1 2.1  PHOS 3.4  --   --   --   --   --   < > = values in this interval not  displayed. Liver Function Tests:  Recent Labs Lab 10/21/12 0400  AST 45*  ALT <5  ALKPHOS 118*  BILITOT 1.1  PROT 7.8  ALBUMIN 2.7*   CBC:  Recent Labs Lab 10/20/12 0606 10/21/12 0400  WBC 9.6 7.8  NEUTROABS 6.5 6.3  HGB 11.9* 12.0  HCT 36.5 36.1  MCV 90.3 87.6  PLT 172 145*   CBG:  Recent Labs Lab 10/21/12 1703 10/21/12 2122 10/22/12 0737 10/22/12 1149 10/22/12 1629 10/22/12 2105  GLUCAP 147* 144* 140* 134* 143* 147*   Coagulation:  Recent Labs Lab 10/18/12 1205  LABPROT 15.2  INR 1.23   Anemia Panel:  Recent Labs Lab 10/18/12 0523  VITAMINB12 718  FOLATE 17.1  FERRITIN 149  TIBC 422  IRON 50  RETICCTPCT 4.7*   Urine Drug Screen: Drugs of Abuse     Component Value Date/Time  LABOPIA NEGATIVE 01/15/2009 0117   COCAINSCRNUR NEGATIVE 01/15/2009 0117   LABBENZ  Value: POSITIVE (NOTE) Sent for confirmatory testing Result repeated and verified.* 01/15/2009 0117   AMPHETMU NEGATIVE 01/15/2009 0117    Urinalysis:  Recent Labs Lab 10/19/12 1156 10/20/12 0816  COLORURINE YELLOW YELLOW  LABSPEC 1.009 1.017  PHURINE 7.5 8.5*  GLUCOSEU NEGATIVE NEGATIVE  HGBUR NEGATIVE SMALL*  BILIRUBINUR NEGATIVE NEGATIVE  KETONESUR NEGATIVE 15*  PROTEINUR NEGATIVE NEGATIVE  UROBILINOGEN 1.0 1.0  NITRITE NEGATIVE NEGATIVE  LEUKOCYTESUR SMALL* LARGE*   Micro Results: Recent Results (from the past 240 hour(s))  CULTURE, BLOOD (ROUTINE X 2)     Status: None   Collection Time    10/14/12 11:58 AM      Result Value Range Status   Specimen Description BLOOD LEFT ARM   Final   Special Requests BOTTLES DRAWN AEROBIC AND ANAEROBIC 10CC   Final   Culture  Setup Time     Final   Value: 10/14/2012 16:39     Performed at [facility]   Culture     Final   Value: NO GROWTH 5 DAYS     Performed at [facility]   Report Status 10/20/2012 FINAL   Final  CULTURE, BLOOD (ROUTINE X 2)     Status: None   Collection Time    10/14/12 12:05 PM       Result Value Range Status   Specimen Description BLOOD LEFT HAND   Final   Special Requests BOTTLES DRAWN AEROBIC AND ANAEROBIC 10CC   Final   Culture  Setup Time     Final   Value: 10/14/2012 16:39     Performed at [facility]   Culture     Final   Value: NO GROWTH 5 DAYS     Performed at [facility]   Report Status 10/20/2012 FINAL   Final  CULTURE, BLOOD (ROUTINE X 2)     Status: None   Collection Time    10/20/12 10:00 AM      Result Value Range Status   Specimen Description BLOOD LEFT HAND   Final   Special Requests BOTTLES DRAWN AEROBIC ONLY 10CC   Final   Culture  Setup Time     Final   Value: 10/20/2012 23:47     Performed at [facility]   Culture     Final   Value:        BLOOD CULTURE RECEIVED NO GROWTH TO DATE CULTURE WILL BE HELD FOR 5 DAYS BEFORE ISSUING A FINAL NEGATIVE REPORT     Performed at [facility]   Report Status PENDING   Incomplete  CULTURE, BLOOD (ROUTINE X 2)     Status: None   Collection Time    10/20/12 10:10 AM      Result Value Range Status   Specimen Description BLOOD RIGHT HAND   Final   Special Requests BOTTLES DRAWN AEROBIC ONLY 10CC   Final   Culture  Setup Time     Final   Value: 10/20/2012 23:47     Performed at [facility]   Culture     Final   Value:        BLOOD CULTURE RECEIVED NO GROWTH TO DATE CULTURE WILL BE HELD FOR 5 DAYS BEFORE ISSUING A FINAL NEGATIVE REPORT     Performed at [facility]   Report Status PENDING   Incomplete   Studies/Results: Mr [name] Wo Contrast  10/22/2012   *RADIOLOGY REPORT*  Clinical Data: Encephalopathy.  Recent staph aureus bacteremia.  MRI HEAD WITHOUT AND WITH CONTRAST  Technique:  Multiplanar, multiecho pulse sequences of the brain and surrounding structures were obtained according to standard protocol without and with intravenous contrast  Contrast: 20mL MULTIHANCE GADOBENATE DIMEGLUMINE 529 MG/ML IV SOLN  Comparison: CT head without contrast  12/11/2011.  Findings: The fusion and images demonstrate no evidence for acute infarct or focal abnormality.  Scattered periventricular subcortical T2 hyperintensities are present bilaterally.  No significant cortical or confluent subcortical signal abnormality is present.  The postcontrast images are somewhat distorted by patient motion. No pathologic enhancement is present.  Flow is present in the major intracranial arteries.  The globes and orbits are intact.  The paranasal sinuses and mastoid air cells are clear.  IMPRESSION:  1.  Scattered periventricular sub cortical T2 hyperintensities bilaterally. The finding is nonspecific but can be seen in the setting of chronic microvascular ischemia, a demyelinating process such as multiple sclerosis, vasculitis, complicated migraine headaches, or as the sequelae of a prior infectious or inflammatory process. 2.  No acute intracranial abnormality or pathologic enhancement to suggest septic emboli.   Original Report Authenticated By: Marin Roberts, M.D.   Dg Chest Port 1 View  10/22/2012   *RADIOLOGY REPORT*  Clinical Data: Assess edema  PORTABLE CHEST - 1 VIEW  Comparison: 10/21/2012  Findings: There is improved lung aeration.  The irregularly thickened interstitial markings have decreased and more confluent lung base opacity has improved.  The cardiac silhouette is normal in size.  No mediastinal or hilar masses are appreciated.  IMPRESSION: Improved lung aeration since the previous day's study consistent with improved pulmonary edema.   Original Report Authenticated By: Amie Portland, M.D.   Dg Abd Portable 1v  10/21/2012   *RADIOLOGY REPORT*  Clinical Data: Abdominal pain  PORTABLE ABDOMEN - 1 VIEW  Comparison: CT scan 10/19/2012  Findings: Moderate colonic stool is noted.  There is mild gaseous distended small bowel loop in mid abdomen.  Ileus or enteritis cannot be excluded. Mild dextroscoliosis and mild degenerative changes lumbar spine.  The study is  limited by patient's large body habitus.  IMPRESSION: Mild gaseous distended small bowel loop in mid abdomen.  Ileus or enteritis cannot be excluded.   Original Report Authenticated By: Natasha Mead, M.D.   Medications: I have reviewed the patient's current medications. Scheduled Meds: . antiseptic oral rinse  15 mL Mouth Rinse BID  . ceFEPime (MAXIPIME) IV  1 g Intravenous Q8H  . FLUoxetine  20 mg Oral Daily  . heparin  5,000 Units Subcutaneous Q8H  . hydrocerin  1 application Topical Daily  . insulin aspart  0-15 Units Subcutaneous TID WC  . insulin aspart  0-5 Units Subcutaneous QHS  . insulin aspart  4 Units Subcutaneous TID WC  . insulin glargine  12 Units Subcutaneous QHS  . levothyroxine  137 mcg Oral QAC breakfast  . magic mouthwash  5 mL Oral TID  . metoCLOPramide (REGLAN) injection  5 mg Intravenous Q12H  . midazolam  4 mg Intravenous Once   Continuous Infusions: . sodium chloride Stopped (10/22/12 0925)  . sodium chloride 10 mL/hr at 10/22/12 1230   PRN Meds:.albuterol, fentaNYL, lactulose, lip balm, ondansetron (ZOFRAN) IV  Assessment/Plan: [name] is a 67 y.o. female PMH moderate aortic stenosis, diastolic CHF (EF 29-56%), COPD (on 2L home O2), chronic venous insufficiency, recent admission from 7/25-7/31/14 for RLE cellulitis complicated by MSSA bacteremia, receiving Ancef  x 6 weeks through a PICC, who presents with shortness of breath and increased oxygen dependency x1 week.    #Acute encephalopathy -  She was transferred to [facility] care on 10/19/12 for acute encephalopathy and metabolic disturbances.  08/28/ blood gas:  pH 7.471, pCO2- 35.3, pO2- 71.0, Bicarb 25.8.  She is AAO x3 today.   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  Normal magnesium levels. K is 3.5 today. - AM CMP/Mg and replete as necessary  #Acute on Chronic Multifactorial Diastolic CHF exacerbation - Acute on chronic diastolic heart failure is the most likely cause of dyspnea in this patient with a 20-30lb weight gain in ~3 weeks, signs of fluid overload on exam including crackles and LE edema, CXR showing pulmonary interstitial edema, and an increased pro-BNP=694. Echo on 09/19/12 showed moderate LVH, hyperdynamic systolic function with EF 70-75%, no wall motion abnormalities, moderate aortic stenosis 2/2 calcified annulus, not candidate of AVR.  - Monitor on telemetry  - per cardiology, will most likely need to restart diuretics - Possible candidate for AVR or TAVR if adequate weight loss  - Monitor BMP, Mg BID  - Continue O2 via [place]. Currently on 5. Goal 92-96%.  - Daily weights  - Strict I/Os -  - Cardiologist Dr. Eden Emms  Orthostatic Hypotension - most likely due to hypovolemia due to diuresis  - Orthostatic VS - normal  - unable to apply TED due to significant LE swelling and erythemas.  - Per Cardiology - hold metoprolol for now  - On 8/30: patient has tachycardia to 120's and remains HTN 170'2 - 190's SBP. Options are limited for BP control, will appreciated cardiology's rec's.  #Urinary retention and UTI - pt foley d/c this AM and since then she has had little output.  Bladder scan showed 686cc.  In and out cath returned 1L.  Restarted cath on 8/29. - urine culture pending - patient is on cefepime  #Metabolic Alkalosis - contraction alkalosis due to diuretic use vs chronic hypoxic hypercapnia due to COPD  - Monitor bicarb   - Monitor for respiratory depression   #Vaginal bleeding - resolved, possibly blood from foley catheter, however physical examination revealed mild blood in vaginal vault. She is s/p hysterectomy.  -Consulted Dr. Erin Fulling over phone who suggested that due to no  current active bleeding and difficulty in examination in hospital setting and resolution of bleeding to follow-up as outpatient with her or with her OBGYN  -PT normal, PTT prolonged (past history of prolonged PTT)  -UA 8/26 - no blood, small LE   #RLE cellulitis with MSSA bacteremia - Improving. On 6 weeks of IV abx to cover for endocarditis.  - Continue home Ancef via PICC, started 7/31 for 6 weeks, until 8/31  - home MS Contin - held - Obtain blood cultures - no growth to date  - Xray R foot 7/25 - diffuse soft tissue prominence suspicious for cellulitis  - Right LE Duplex US 7/27 - no evidence of DVT, SVT, or baker's cyst  - LE Arterial Duplex 07/19/2010 - moderate ABI, aortic iliac disease  - Wound care consult - 8/22 No open wound visible. Applied foam dressing and secured with kerlex to  absorb drainage and protect from further injury.  -PT consult - recommend home health PT and wide 3n1  - TEE on 08/29 - no evidence of endocarditis  #DM2 - controlled, on metformin at home  - HbA1C - 6.7  -CBG AC and QHS  - Lantus 12U at bedtime , Novolog 4U  - Nutrition consult - Obese Class III - no further nutrition intervention   #COPD on home O2 2L - PFTs 03/01/2012 shows moderate obstructive lung defect (FEV1/FVC 63%, RV 126%) and severely decreased diffusion capacity (DLCO 49%), no response to bronchodilators. On home oxygen. Unknown baseline O2 sat.  - Goal O2 sat 92-96%  - Will monitor for signs/symptoms of hypercapnia  - incentive spirometry, upright position  #Hypothyroidism - S/p thyroidectomy. Last TSH 0.153 in 11/2011. 3.1cm soft tissue mass behind [place] joint possible thyroid nodule  - TSH WNL  - Continue home synthroid  -will need outpatient thyroid US  - lactulose as needed for constipation   Normocytic Anemia - due to chronic disease (hypothyroidism) vs iron deficiency vs thalassemia vs GI blood loss  - stable - Anemia panel - iron, ferritin, TIBC, vit B12, folate normal, iron sat  low  -Blood smear with polychromasia   Thrombocytopenia -chronic since 12/13/11. Suspect due to splenomegaly. In 2012 [place] abd with normal splenomegaly. On 10/13/12 CT chest, slight splenomegaly. Other possibilties include medication induced, ITP, HIT syndrome, pseuothrombocytopenia, and bone marrow suppression  - improving, 145  -monitor for bleeding   #Elevated d-dimer - Her Wells' score is 3.5, earning points for clinical signs of DVT (asymmetric LE edema, erythema), PE being a likely diagnosis, and immobilization for at least 3 days. This places her in a high risk group with 40.6% chance of PE in an ED population. We will therefore proceed to definitive imaging. Cr 0.70.  -CTA chest - No PE  -CT findings: Small loculated R pleural effusion, probable PAH, hepatosplenomegaly, nodulary of the liver parenchyma c/w cirrhosis   #Chest tightness -resolved, x3weeks, improved after receiving Lasix in the ED. Likely 2/2 dyspnea and pulmonary edema. EKG WNL, but important to rule out coronary ischemia in this patient with significant co-morbidities including obesity, diabetes, heart failure.   - Trending troponins q6h x3 - neg   #Depression - Stable.  - Continue prozac   #Diet - Heart healthy   #DVT PPX - subq Heparin   Dispo: Disposition is deferred at this time, awaiting improvement of current medical problems.  Anticipated discharge in approximately 2-3 day(s).   The patient does have a current PCP Andrey Campanile Elizebeth Koller, MD) and does need an [facility] hospital follow-up appointment after discharge.  The patient does not have transportation limitations that hinder transportation to clinic appointments.  .Services Needed at time of discharge: Y = Yes, Blank = No PT:   OT:   RN:   Equipment:   Other:     LOS: 10 days   Pleas Koch, MD 10/23/2012, 7:50 AM

## 2012-10-23 NOTE — Progress Notes (Signed)
Labetalol 20 mg IV was ordered . 5 mg given IV q 5-8  min w/b/p recycled ea time . Total of 15 mg given by 1230 when b/p = 145/45 . Order received from Dr Gala Romney to hold the last 5 mg labetalol that was ordered .

## 2012-10-23 NOTE — Progress Notes (Signed)
ANTIBIOTIC CONSULT NOTE - FOLLOW UP  Pharmacy Consult for Cefepime Indication: Empiric  Allergies  Allergen Reactions  . Latex     REACTION: itch  . Penicillins     REACTION: hives - tolerates Ancef    Patient Measurements: Height: 5' (152.4 cm) Weight: 237 lb 3.4 oz (107.6 kg) IBW/kg (Calculated) : 45.5 Adjusted Body Weight:   Vital Signs: Temp: 97.8 F (36.6 C) (08/30 0758) Temp src: Oral (08/30 0758) BP: 185/57 mmHg (08/30 0803) Pulse Rate: 117 (08/30 0803) Intake/Output from previous day: 08/29 0701 - 08/30 0700 In: 525 [P.O.:180; I.V.:195; IV Piggyback:150] Out: 2225 [Urine:2225] Intake/Output from this shift:    Labs:  Recent Labs  10/20/12 1841 10/21/12 0400 10/22/12 0430 10/23/12 0650  WBC  --  7.8  --  13.6*  HGB  --  12.0  --  14.2  PLT  --  145*  --  156  CREATININE 0.72 0.63 0.68  --    Estimated Creatinine Clearance: 76.8 ml/min (by C-G formula based on Cr of 0.68). No results found for this basename: VANCOTROUGH, VANCOPEAK, VANCORANDOM, GENTTROUGH, GENTPEAK, GENTRANDOM, TOBRATROUGH, TOBRAPEAK, TOBRARND, AMIKACINPEAK, AMIKACINTROU, AMIKACIN,  in the last 72 hours   Microbiology: Recent Results (from the past 720 hour(s))  CULTURE, BLOOD (ROUTINE X 2)     Status: None   Collection Time    10/14/12 11:58 AM      Result Value Range Status   Specimen Description BLOOD LEFT ARM   Final   Special Requests BOTTLES DRAWN AEROBIC AND ANAEROBIC 10CC   Final   Culture  Setup Time     Final   Value: 10/14/2012 16:39     Performed at [facility]   Culture     Final   Value: NO GROWTH 5 DAYS     Performed at [facility]   Report Status 10/20/2012 FINAL   Final  CULTURE, BLOOD (ROUTINE X 2)     Status: None   Collection Time    10/14/12 12:05 PM      Result Value Range Status   Specimen Description BLOOD LEFT HAND   Final   Special Requests BOTTLES DRAWN AEROBIC AND ANAEROBIC 10CC   Final   Culture  Setup Time     Final   Value:  10/14/2012 16:39     Performed at [facility]   Culture     Final   Value: NO GROWTH 5 DAYS     Performed at [facility]   Report Status 10/20/2012 FINAL   Final  CULTURE, BLOOD (ROUTINE X 2)     Status: None   Collection Time    10/20/12 10:00 AM      Result Value Range Status   Specimen Description BLOOD LEFT HAND   Final   Special Requests BOTTLES DRAWN AEROBIC ONLY 10CC   Final   Culture  Setup Time     Final   Value: 10/20/2012 23:47     Performed at [facility]   Culture     Final   Value:        BLOOD CULTURE RECEIVED NO GROWTH TO DATE CULTURE WILL BE HELD FOR 5 DAYS BEFORE ISSUING A FINAL NEGATIVE REPORT     Performed at [facility]   Report Status PENDING   Incomplete  CULTURE, BLOOD (ROUTINE X 2)     Status: None   Collection Time    10/20/12 10:10 AM      Result Value  Range Status   Specimen Description BLOOD RIGHT HAND   Final   Special Requests BOTTLES DRAWN AEROBIC ONLY 10CC   Final   Culture  Setup Time     Final   Value: 10/20/2012 23:47     Performed at [facility]   Culture     Final   Value:        BLOOD CULTURE RECEIVED NO GROWTH TO DATE CULTURE WILL BE HELD FOR 5 DAYS BEFORE ISSUING A FINAL NEGATIVE REPORT     Performed at [facility]   Report Status PENDING   Incomplete    Anti-infectives   Start     Dose/Rate Route Frequency Ordered Stop   10/21/12 2000  ceFEPIme (MAXIPIME) 1 g in dextrose 5 % 50 mL IVPB     1 g 100 mL/hr over 30 Minutes Intravenous Every 8 hours 10/21/12 1730     10/20/12 2330  vancomycin (VANCOCIN) IVPB 1000 mg/200 mL premix  Status:  Discontinued     1,000 mg 200 mL/hr over 60 Minutes Intravenous Every 12 hours 10/20/12 1010 10/21/12 1645   10/20/12 1100  vancomycin (VANCOCIN) 2,000 mg in sodium chloride 0.9 % 500 mL IVPB     2,000 mg 250 mL/hr over 120 Minutes Intravenous  Once 10/20/12 1010 10/20/12 1232   10/20/12 1100  ceFEPIme (MAXIPIME) 1 g in dextrose 5 % 50 mL IVPB   Status:  Discontinued     1 g 100 mL/hr over 30 Minutes Intravenous Every 8 hours 10/20/12 1010 10/21/12 1730   10/19/12 1000  fluconazole (DIFLUCAN) tablet 100 mg  Status:  Discontinued     100 mg Oral Daily 10/18/12 1332 10/18/12 1334   10/19/12 1000  fluconazole (DIFLUCAN) tablet 100 mg     100 mg Oral Daily 10/18/12 1334 10/19/12 1117   10/18/12 1400  ceFAZolin (ANCEF) IVPB 2 g/50 mL premix  Status:  Discontinued     2 g 100 mL/hr over 30 Minutes Intravenous 3 times per day 10/18/12 1331 10/20/12 0952   10/14/12 1400  ceFAZolin (ANCEF) IVPB 2 g/50 mL premix  Status:  Discontinued     2 g 100 mL/hr over 30 Minutes Intravenous 3 times per day 10/14/12 1128 10/18/12 1331   10/14/12 1000  fluconazole (DIFLUCAN) tablet 100 mg  Status:  Discontinued     100 mg Oral Daily 10/13/12 2133 10/18/12 1332   10/13/12 2200  ceFAZolin (ANCEF) IVPB 2 g/50 mL premix  Status:  Discontinued     2 g 100 mL/hr over 30 Minutes Intravenous 3 times per day 10/13/12 2133 10/14/12 1128      Assessment: SOB, LEE, wt gain 20-30lb   ID: Recent MSSA bactermia on Ancef PTA (to finish on 8/31), admitted w/ resp distress, obtundation, fevers. ID consulted. ABX broadened to vancomycin and cefepime. Recommending MRI brain to r/o CNS emboli, TEE to r/o vegetation given severe AS, also poss line infxn since she had a PICC (now removed). CrCl 76  Ancef preadmit>>8/27 Cefepime 8/27>> Vancomycin 8/27>>8/28  8/21 blood>> negative 8/27 blood x2>>pending    Plan:  Cefepime 1g IV q8hrs. Dose ok for CrCl >50 Pharmacy will sign off. Please reconsult for further dosing assistance.  Merilynn Finland, [employer] 10/23/2012,8:33 AM

## 2012-10-23 NOTE — Progress Notes (Signed)
eLink Physician-Brief Progress Note Patient Name: [name] DOB: 27-Dec-1945 MRN: [id number]  Date of Service  10/23/2012   HPI/Events of Note  Patient with urinary retention despite I/O cath earlier.  Now on bladder scan with greater than 500 cc urine   eICU Interventions  Plan: Place foley catheter      DETERDING,ELIZABETH 10/23/2012, 2:25 AM

## 2012-10-24 LAB — BASIC METABOLIC PANEL
CO2: 22 mEq/L (ref 19–32)
Calcium: 9.1 mg/dL (ref 8.4–10.5)
Creatinine, Ser: 0.66 mg/dL (ref 0.50–1.10)
GFR calc non Af Amer: >90 mL/min (ref >90)
Glucose, Bld: 153 mg/dL — ABNORMAL HIGH (ref 70–99)
Sodium: 128 mEq/L — ABNORMAL LOW (ref 135–145)

## 2012-10-24 LAB — CBC WITH DIFFERENTIAL/PLATELET
Basophils Absolute: 0 10*3/uL (ref 0.0–0.1)
Eosinophils Absolute: 0.2 10*3/uL (ref 0.0–0.7)
Eosinophils Relative: 2 % (ref 0–5)
Lymphs Abs: 2.2 10*3/uL (ref 0.7–4.0)
MCH: 29.6 pg (ref 26.0–34.0)
MCHC: 34.4 g/dL (ref 30.0–36.0)
MCV: 86.1 fL (ref 78.0–100.0)
Neutrophils Relative %: 71 % (ref 43–77)
Platelets: 137 10*3/uL — ABNORMAL LOW (ref 150–400)
RDW: 15.6 % — ABNORMAL HIGH (ref 11.5–15.5)

## 2012-10-24 LAB — GLUCOSE, CAPILLARY
Glucose-Capillary: 148 mg/dL — ABNORMAL HIGH (ref 70–99)
Glucose-Capillary: 141 mg/dL — ABNORMAL HIGH (ref 70–99)
Glucose-Capillary: 122 mg/dL — ABNORMAL HIGH (ref 70–99)

## 2012-10-24 LAB — HEPATIC FUNCTION PANEL
Alkaline Phosphatase: 126 U/L — ABNORMAL HIGH (ref 39–117)
Bilirubin, Direct: 0.3 mg/dL (ref 0.0–0.3)
Indirect Bilirubin: 0.8 mg/dL (ref 0.3–0.9)
Total Bilirubin: 1.1 mg/dL (ref 0.3–1.2)
Total Protein: 7.5 g/dL (ref 6.0–8.3)

## 2012-10-24 MED ORDER — CARVEDILOL 6.25 MG PO TABS
6.2500 mg | ORAL_TABLET | Freq: Two times a day (BID) | ORAL | Status: DC
  Administered 2012-10-24 – 2012-10-28 (×9): 6.25 mg via ORAL
  Filled 2012-10-24 (×11): qty 1

## 2012-10-24 MED ORDER — INSULIN ASPART 100 UNIT/ML ~~LOC~~ SOLN
4.0000 [IU] | Freq: Three times a day (TID) | SUBCUTANEOUS | Status: DC
  Administered 2012-10-24: 2 [IU] via SUBCUTANEOUS
  Administered 2012-10-24 – 2012-10-28 (×9): 4 [IU] via SUBCUTANEOUS

## 2012-10-24 MED ORDER — CARVEDILOL 6.25 MG PO TABS
6.2500 mg | ORAL_TABLET | Freq: Two times a day (BID) | ORAL | Status: DC
  Administered 2012-10-24: 6.25 mg via ORAL
  Filled 2012-10-24 (×2): qty 1

## 2012-10-24 NOTE — Evaluation (Signed)
Physical Therapy Re-Evaluation Patient Details Name: [name] MRN: [id number] DOB: Feb 09, 1946 Today's Date: 10/24/2012 Time: 7829-5621 PT Time Calculation (min): 24 min  PT Assessment / Plan / Recommendation History of Present Illness  67 yr old female with severe AS, HFpEF (Chronic diastolic heart failure), morbid obesity Body mass index is 52.67 kg/(m^2)., Chronic respiratory failure due to COPD on home O2, chronic pain syndrome, with SOB and weight gain. She has evidence of acute on chronic diastolic heart failure.  She is currently on treatment for MSSA bacteremia and Right LE cellulitis via PICC line.  She has not been a candidate for TAVR.  She feels slightly better today after being started on IV diuretics, as she had a 20-30 lb weight gain recently.  On 8/26 moved to SDU due to acute encephalopathy and multiple metabolic disturbances.  Clinical Impression  Patient presents with decreased independence with mobility due to deficits listed below.  Feel she will benefit from resuming acute level PT to maximize independence and allow pt to return home with spouse, Hospice service, HHPT and extra hired assist to ensure 24 hour help available.      PT Assessment  Patient needs continued PT services    Follow Up Recommendations  Home health PT;Supervision/Assistance - 24 hour    Does the patient have the potential to tolerate intense rehabilitation    N/A  Barriers to Discharge  None      Equipment Recommendations   (wide 3:1)    Recommendations for Other Services   Case mgr assist spouse to hire extra help  Frequency Min 3X/week    Precautions / Restrictions Precautions Precautions: Fall   Pertinent Vitals/Pain C/o leg pain 5/10      Mobility  Bed Mobility Bed Mobility: Not assessed Details for Bed Mobility Assistance: up in recliner Transfers Sit to Stand: 3: Mod assist;From chair/3-in-1;With armrests Stand to Sit: 4: Min assist;To chair/3-in-1;Without upper  extremity assist Details for Transfer Assistance: sat in chair while still holding walker Ambulation/Gait Ambulation/Gait Assistance: 4: Min assist Ambulation Distance (Feet): 45 Feet Assistive device: Rolling walker Ambulation/Gait Assistance Details: cues for posture and to encourage distance Gait Pattern: Trunk flexed;Shuffle;Decreased stride length    Exercises General Exercises - Lower Extremity Ankle Circles/Pumps: AROM;Both;10 reps;Seated Heel Slides: AAROM;Both;5 reps;Seated   PT Diagnosis: Difficulty walking;Generalized weakness  PT Problem List: Decreased strength;Decreased activity tolerance;Decreased balance;Decreased mobility;Cardiopulmonary status limiting activity PT Treatment Interventions: Gait training;DME instruction;Functional mobility training;Patient/family education;Therapeutic activities;Therapeutic exercise;Balance training;Stair training     PT Goals(Current goals can be found in the care plan section) Acute Rehab PT Goals Patient Stated Goal: to go home PT Goal Formulation: With patient/family Time For Goal Achievement: 10/31/12 Potential to Achieve Goals: Fair  Visit Information  Last PT Received On: 10/24/12 Assistance Needed: +2 (helpful for IV, O2, lines) History of Present Illness: 67 yr old female with severe AS, HFpEF (Chronic diastolic heart failure), morbid obesity Body mass index is 52.67 kg/(m^2)., Chronic respiratory failure due to COPD on home O2, chronic pain syndrome, with SOB and weight gain. She has evidence of acute on chronic diastolic heart failure.  She is currently on treatment for MSSA bacteremia and Right LE cellulitis via PICC line.  She has not been a candidate for TAVR.  She feels slightly better today after being started on IV diuretics, as she had a 20-30 lb weight gain recently.  On 8/26 moved to SDU due to acute encephalopathy and multiple metabolic disturbances.       Prior Functioning  Home Living Family/patient expects to  be discharged to:: Private residence Living Arrangements: Spouse/significant other Available Help at Discharge: Family Type of Home: House Home Access: Stairs to enter [employer] of Steps: 5 in the front, 5-6 in the back  Entrance Stairs-Rails: Right;Left Home Layout: One level Home Equipment: Bedside commode;Walker - 4 wheels;Wheelchair - Pharmacist, hospital Comments: wheelchair is borrowed. has been using a maskshift ramp to get into/out of house, husband as been pushing pt in and out. HHA 2 x/week; HHRN 1x/week,  Prior Function Level of Independence: Needs assistance Gait / Transfers Assistance Needed: short distance amulation ~10' with RW ADL's / Homemaking Assistance Needed: Husband assists with ADLs, however did not specify level of assist. Comments: Has 2 cats, likes cards.  Communication Communication: No difficulties Dominant Hand: Right    Cognition  Cognition Arousal/Alertness: Awake/alert Behavior During Therapy: WFL for tasks assessed/performed Overall Cognitive Status: Within Functional Limits for tasks assessed    Extremity/Trunk Assessment Lower Extremity Assessment Lower Extremity Assessment: Generalized weakness   Balance Balance Balance Assessed: Yes Static Standing Balance Static Standing - Balance Support: Bilateral upper extremity supported Static Standing - Level of Assistance: 4: Min assist  End of Session PT - End of Session Equipment Utilized During Treatment: Gait belt;Oxygen Activity Tolerance: Patient limited by fatigue Patient left: in chair;with family/visitor present  GP     [facility] 10/24/2012, 2:48 PM Sheran Lawless, PT [phone] 10/24/2012

## 2012-10-24 NOTE — Progress Notes (Signed)
Primary Physician: [name], MD Primary Cardiologist: Eden Emms. Also seen 02/2012 by Cooper/Owen in TAVR clinic  Chief Complaint: SOB, a/c LEE,  weight gain 20-30lb   HF Rounding NOTE    HPI: [name] is a 68 y/o F with morbid obesity, moderate to severe AS, spinal stenosis (uses mostly wheelchair), COPD on home O2, and chronic pain requiring narcotics admitted with recurrent volume overload and resp distress. Getting IV abx due to recent bacteremia and LE cellulitis.   Results of Palliative Care consult noted. BP and HR up yesterday. Given one dose of labetalol. Now back down some. Denies HF symptoms. Weight stable.    Inpatient Medications:  . antiseptic oral rinse  15 mL Mouth Rinse BID  . ceFEPime (MAXIPIME) IV  1 g Intravenous Q8H  . diclofenac  1 patch Transdermal BID  . DULoxetine  30 mg Oral Daily  . enoxaparin (LOVENOX) injection  40 mg Subcutaneous Q24H  . hydrocerin  1 application Topical Daily  . insulin aspart  0-15 Units Subcutaneous TID WC  . insulin aspart  0-5 Units Subcutaneous QHS  . insulin aspart  4 Units Subcutaneous TID WC  . insulin glargine  12 Units Subcutaneous QHS  . lactulose  20 g Oral BID  . levothyroxine  137 mcg Oral QAC breakfast  . magic mouthwash  5 mL Oral TID  . metoCLOPramide (REGLAN) injection  5 mg Intravenous Q12H  . oxyCODONE  5 mg Oral Q6H  . pregabalin  25 mg Oral QHS   . sodium chloride Stopped (10/22/12 1610)    Allergies:  Allergies  Allergen Reactions  . Latex     REACTION: itch  . Penicillins     REACTION: hives - tolerates Ancef   Filed Vitals:   10/24/12 0000 10/24/12 0440 10/24/12 0500 10/24/12 0735  BP: 155/51 164/49    Pulse: 104 105  107  Temp: 98.7 F (37.1 C)   98.9 F (37.2 C)  TempSrc: Oral   Oral  Resp: 13 14  13   Height:      Weight:   107.3 kg (236 lb 8.9 oz)   SpO2: 95% 97%  97%     Physical Exam: General: Lethargic but awakens. Sitting in chair. No distress Head: Normocephalic,  atraumatic, sclera non-icteric, no xanthomas, nares are without discharge.  Neck: JVD difficult to see due to body habitus. Bilateral carotid bruits, question radiation from murmur. Lungs: Diminished BS at bases and coarse otherwise. Breathing is unlabored. On 2 liters  Heart: RRR with S1 S2. 2/6 SEM RUSB. S2 markedly depressed Abdomen: Massively obese, non-tender, non-distended with normoactive bowel sounds.  Msk:  Strength and tone appear normal for age. Extremities: No clubbing or cyanosis. Bilateral LE erythematous skin changes and significant R>L with cracking of the skin. RLE with nodular thickening. Trace edema Neuro: Alert and oriented X 3. No facial asymmetry. No focal deficit. Moves all extremities spontaneously. Psych:  Lethargic but awakens GU: Foley   EKG: Sinus Tach 100-110 Labs: No results found for this basename: CKTOTAL, CKMB, TROPONINI,  in the last 72 hours Lab Results  Component Value Date   WBC 12.0* 10/24/2012   HGB 13.8 10/24/2012   HCT 40.1 10/24/2012   MCV 86.1 10/24/2012   PLT 137* 10/24/2012     Recent Labs Lab 10/24/12 0700  NA 128*  K 4.2  CL 97  CO2 22  BUN 23  CREATININE 0.66  CALCIUM 9.1  PROT 7.5  BILITOT 1.1  ALKPHOS 126*  ALT  14  AST 60*  GLUCOSE 153*    Lab Results  Component Value Date   DDIMER 2.00* 10/13/2012    Radiology/Studies:  Dg Chest 2 View  10/13/2012   CLINICAL DATA:  67 year old female with shortness of breath. Cellulitis.  EXAM: CHEST  2 VIEW  COMPARISON:  09/17/2012 and earlier.  FINDINGS: Right PICC line in place, tip at the level of the lower SVC. Small right pleural effusion new or increased since 09/17/2012. Diffuse increased interstitial opacity also progressed. No pneumothorax. No consolidation. Stable cardiomegaly and mediastinal contours. Stable visualized osseous structures.  IMPRESSION: 1.  Small right pleural effusion is new/ increased.  2. Increased interstitial markings, favor pulmonary interstitial edema.  Viral/ atypical infection is less likely.  3. Chronic cardiomegaly.  4. Right PICC line in place, tip at the lower SVC level.   Electronically Signed   By: Augusto Gamble   On: 10/13/2012 15:55    Ct Angio Chest Pe W/cm &/or Wo Cm 10/14/2012   *RADIOLOGY REPORT*  Clinical Data: Shortness of breath and cough.  Hypoxia.  CT ANGIOGRAPHY CHEST  Technique:  Multidetector CT imaging of the chest using the standard protocol during bolus administration of intravenous contrast. Multiplanar reconstructed images including MIPs were obtained and reviewed to evaluate the vascular anatomy.  Contrast: OMNIPAQUE IOHEXOL 350 MG/ML SOLN  Comparison: Chest x-ray dated 10/13/2012  Findings: There is cardiomegaly.  There is a small loculated right pleural effusion.  There is pulmonary vascular congestion of the origin of the main pulmonary arteries suggestive of pulmonary arterial hypertension.    No pulmonary emboli.   There are a few scattered lymph nodes in the mediastinum and left hilum, and none of which are pathologically enlarged.  There is a 3.1 cm soft tissue mass extending behind the right sternoclavicular joint which probably represents a thyroid nodule.  The patient appears to have had previous thyroid surgery on the right and left.  There is slight splenomegaly.  The patient has hepatomegaly with slight nodularity of the liver contour suggesting cirrhosis.  IMPRESSION: 1.  No pulmonary emboli. 2.  Cardiomegaly with right pleural effusion and pulmonary vascular congestion and probable pulmonary arterial hypertension. 3.  Hepatosplenomegaly.  Nodularity of the liver parenchyma consistent with cirrhosis.   Original Report Authenticated By: Francene Boyers, M.D.      Assessment and Plan:  1. 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 Mod-severe aortic stenosis. EF 70% 3. Morbid obesity 4. Spinal stenosis 5. Chronic pain 6. Severe deconditioning 7. PAH by echo 8. COPD  on home O2 9. Recent MSSA bacteremia and R leg cellulits, abx through 10/24/12 via PICC 10. Hypokalemia/hyponatremia 11. Metabolic alkalosis 12. Altered mental status 13. Cellulitis- on Ancef. Blood culture 09/17/12 MSSA 14. Fever 15. DNR  Volume status is stable off diuretics. BP and HR up. May be due to discomfort or mild withdrawal. Will add low dose carvedilol.   Would restart diuretics (torsemide 40 bid) when weight starts climbing again (240 or greater). If weight stays down would send home on torsemide 40 daily.   We will follow at a distance. Please call with questions.    [name] Landau,MD 9:04 AM

## 2012-10-24 NOTE — Progress Notes (Signed)
Subjective: [name] was transferred from ICU on 10/22/12 and says she is imrpoving.  Yesterday, patient was tachycardic and hypertensive . This improved with new pain regimen. Today, the heart failure team initiate a low dose BB which has decreased HR to 90's and BP to normotensive.  Objective: Vital signs in last 24 hours: Filed Vitals:   10/23/12 1900 10/23/12 2000 10/24/12 0000 10/24/12 0500  BP: 140/56 186/56 155/51   Pulse: 95 96 104   Temp:  98.6 F (37 C) 98.7 F (37.1 C)   TempSrc:  Oral Oral   Resp: 13 13 13    Height:      Weight:    236 lb 8.9 oz (107.3 kg)  SpO2: 96% 97% 95%    Weight change: -10.6 oz (-0.3 kg)  Intake/Output Summary (Last 24 hours) at 10/24/12 0643 Last data filed at 10/24/12 0439  Gross per 24 hour  Intake    630 ml  Output   1115 ml  Net   -485 ml   General: resting in bed in NAD, answering questions appropriately HEENT: PERRL, no scleral icterus Cardiac: irregular rhythm, grade 3 systolic murmur, peripheral pulses intact Pulm: clear to auscultation bilaterally, moving normal volumes of air, on 5 L via [place] Abd: soft, nondistended, BS present, + lower abdominal/suprapubic tenderness Ext: warm and well perfused, 3+ B/L lower extremity edema Neuro: alert and oriented X3  Lab Results: Basic Metabolic Panel:  Recent Labs Lab 10/19/12 1300  10/22/12 2200 10/23/12 0650 10/23/12 1738  NA 132*  < >  --  131* 130*  K 2.8*  < >  --  3.2* 4.2  CL 82*  < >  --  97 98  CO2 43*  < >  --  21 24  GLUCOSE 202*  < >  --  141* 127*  BUN 28*  < >  --  21 21  CREATININE 1.11*  < >  --  0.61 0.61  CALCIUM 9.4  < >  --  9.4 9.2  MG  --   < > 2.1 2.1  --   PHOS 3.4  --   --   --   --   < > = values in this interval not displayed. Liver Function Tests:  Recent Labs Lab 10/21/12 0400 10/23/12 0650  AST 45* 53*  ALT <5 9  ALKPHOS 118* 127*  BILITOT 1.1 1.2  PROT 7.8 7.9  ALBUMIN 2.7* 2.7*   CBC:  Recent Labs Lab 10/21/12 0400  10/23/12 0650  WBC 7.8 13.6*  NEUTROABS 6.3 10.4*  HGB 12.0 14.2  HCT 36.1 41.1  MCV 87.6 85.1  PLT 145* 156   CBG:  Recent Labs Lab 10/22/12 1629 10/22/12 2105 10/23/12 0803 10/23/12 1153 10/23/12 1744 10/23/12 2239  GLUCAP 143* 147* 143* 148* 119* 135*   Coagulation:  Recent Labs Lab 10/18/12 1205  LABPROT 15.2  INR 1.23   Anemia Panel:  Recent Labs Lab 10/18/12 0523  VITAMINB12 718  FOLATE 17.1  FERRITIN 149  TIBC 422  IRON 50  RETICCTPCT 4.7*   Urine Drug Screen: Drugs of Abuse     Component Value Date/Time   LABOPIA NEGATIVE 01/15/2009 0117   COCAINSCRNUR NEGATIVE 01/15/2009 0117   LABBENZ  Value: POSITIVE (NOTE) Sent for confirmatory testing Result repeated and verified.* 01/15/2009 0117   AMPHETMU NEGATIVE 01/15/2009 0117    Urinalysis:  Recent Labs Lab 10/20/12 0816 10/23/12 1650  COLORURINE YELLOW AMBER*  LABSPEC 1.017 1.021  PHURINE 8.5* 6.5  GLUCOSEU NEGATIVE NEGATIVE  HGBUR SMALL* NEGATIVE  BILIRUBINUR NEGATIVE NEGATIVE  KETONESUR 15* NEGATIVE  PROTEINUR NEGATIVE NEGATIVE  UROBILINOGEN 1.0 2.0*  NITRITE NEGATIVE NEGATIVE  LEUKOCYTESUR LARGE* SMALL*   Micro Results: Recent Results (from the past 240 hour(s))  CULTURE, BLOOD (ROUTINE X 2)     Status: None   Collection Time    10/14/12 11:58 AM      Result Value Range Status   Specimen Description BLOOD LEFT ARM   Final   Special Requests BOTTLES DRAWN AEROBIC AND ANAEROBIC 10CC   Final   Culture  Setup Time     Final   Value: 10/14/2012 16:39     Performed at [facility]   Culture     Final   Value: NO GROWTH 5 DAYS     Performed at [facility]   Report Status 10/20/2012 FINAL   Final  CULTURE, BLOOD (ROUTINE X 2)     Status: None   Collection Time    10/14/12 12:05 PM      Result Value Range Status   Specimen Description BLOOD LEFT HAND   Final   Special Requests BOTTLES DRAWN AEROBIC AND ANAEROBIC 10CC   Final   Culture  Setup Time     Final    Value: 10/14/2012 16:39     Performed at [facility]   Culture     Final   Value: NO GROWTH 5 DAYS     Performed at [facility]   Report Status 10/20/2012 FINAL   Final  CULTURE, BLOOD (ROUTINE X 2)     Status: None   Collection Time    10/20/12 10:00 AM      Result Value Range Status   Specimen Description BLOOD LEFT HAND   Final   Special Requests BOTTLES DRAWN AEROBIC ONLY 10CC   Final   Culture  Setup Time     Final   Value: 10/20/2012 23:47     Performed at [facility]   Culture     Final   Value:        BLOOD CULTURE RECEIVED NO GROWTH TO DATE CULTURE WILL BE HELD FOR 5 DAYS BEFORE ISSUING A FINAL NEGATIVE REPORT     Performed at [facility]   Report Status PENDING   Incomplete  CULTURE, BLOOD (ROUTINE X 2)     Status: None   Collection Time    10/20/12 10:10 AM      Result Value Range Status   Specimen Description BLOOD RIGHT HAND   Final   Special Requests BOTTLES DRAWN AEROBIC ONLY 10CC   Final   Culture  Setup Time     Final   Value: 10/20/2012 23:47     Performed at [facility]   Culture     Final   Value:        BLOOD CULTURE RECEIVED NO GROWTH TO DATE CULTURE WILL BE HELD FOR 5 DAYS BEFORE ISSUING A FINAL NEGATIVE REPORT     Performed at [facility]   Report Status PENDING   Incomplete  URINE CULTURE     Status: None   Collection Time    10/22/12  5:47 PM      Result Value Range Status   Specimen Description URINE, CATHETERIZED   Final   Special Requests NONE   Final   Culture  Setup Time     Final   Value: 10/22/2012 18:24     Performed  at [facility] Count     Final   Value: NO GROWTH     Performed at [facility]   Culture     Final   Value: NO GROWTH     Performed at [facility]   Report Status 10/23/2012 FINAL   Final   Studies/Results: [name] BM Contrast  10/22/2012   *RADIOLOGY REPORT*  Clinical Data: Encephalopathy.  Recent staph aureus bacteremia.  MRI  HEAD WITHOUT AND WITH CONTRAST  Technique:  Multiplanar, multiecho pulse sequences of the brain and surrounding structures were obtained according to standard protocol without and with intravenous contrast  Contrast: 20mL MULTIHANCE GADOBENATE DIMEGLUMINE 529 MG/ML IV SOLN  Comparison: CT head without contrast 12/11/2011.  Findings: The fusion and images demonstrate no evidence for acute infarct or focal abnormality.  Scattered periventricular subcortical T2 hyperintensities are present bilaterally.  No significant cortical or confluent subcortical signal abnormality is present.  The postcontrast images are somewhat distorted by patient motion. No pathologic enhancement is present.  Flow is present in the major intracranial arteries.  The globes and orbits are intact.  The paranasal sinuses and mastoid air cells are clear.  IMPRESSION:  1.  Scattered periventricular sub cortical T2 hyperintensities bilaterally. The finding is nonspecific but can be seen in the setting of chronic microvascular ischemia, a demyelinating process such as multiple sclerosis, vasculitis, complicated migraine headaches, or as the sequelae of a prior infectious or inflammatory process. 2.  No acute intracranial abnormality or pathologic enhancement to suggest septic emboli.   Original Report Authenticated By: Marin Roberts, M.D.   Medications: I have reviewed the patient's current medications. Scheduled Meds: . antiseptic oral rinse  15 mL Mouth Rinse BID  . ceFEPime (MAXIPIME) IV  1 g Intravenous Q8H  . diclofenac  1 patch Transdermal BID  . DULoxetine  30 mg Oral Daily  . enoxaparin (LOVENOX) injection  40 mg Subcutaneous Q24H  . hydrocerin  1 application Topical Daily  . insulin aspart  0-15 Units Subcutaneous TID WC  . insulin aspart  0-5 Units Subcutaneous QHS  . insulin aspart  4 Units Subcutaneous TID WC  . insulin glargine  12 Units Subcutaneous QHS  . lactulose  20 g Oral BID  . levothyroxine  137 mcg Oral QAC  breakfast  . magic mouthwash  5 mL Oral TID  . metoCLOPramide (REGLAN) injection  5 mg Intravenous Q12H  . oxyCODONE  5 mg Oral Q6H  . pregabalin  25 mg Oral QHS   Continuous Infusions: . sodium chloride Stopped (10/22/12 0925)   PRN Meds:.albuterol, ALPRAZolam, lip balm, ondansetron (ZOFRAN) IV, oxyCODONE  Assessment/Plan: Mrs. PAMELYN BANCROFT is a 67 y.o. female PMH moderate aortic stenosis, diastolic CHF (EF 84-13%), COPD (on 2L home O2), chronic venous insufficiency, recent admission from 7/25-7/31/14 for RLE cellulitis complicated by MSSA bacteremia, receiving Ancef x 6 weeks through a PICC, who presents with shortness of breath and increased oxygen dependency x1 week.    #Acute on Chronic Multifactorial Diastolic CHF exacerbation - Acute on chronic diastolic heart failure is the most likely cause of dyspnea in this patient with a 20-30lb weight gain in ~3 weeks, signs of fluid overload on exam including crackles and LE edema, CXR showing pulmonary interstitial edema, and an increased pro-BNP=694. Echo on 09/19/12 showed moderate LVH, hyperdynamic systolic function with EF 70-75%, no wall motion abnormalities, moderate aortic stenosis 2/2 calcified annulus, not candidate of AVR.  - Monitor on telemetry  -  Heart Failure will manage from a distance: Recs as of 8/31: Volume status is stable off diuretics. BP and HR up. May be due to discomfort or mild withdrawal. Will add low dose carvedilol. Would restart diuretics (torsemide 40 bid) when weight starts climbing again (240 or greater). If weight stays down would send home on torsemide 40 daily.  - Possible candidate for AVR or TAVR if adequate weight loss  - Monitor BMP, Mag daily - Continue O2 via [place]. Currently on 5. Goal 92-96%.  - Daily weights  - Strict I/Os -  - Cardiologist Dr. Eden Emms  - On 8/30: patient has tachycardia to 120's and remains HTN 170'2 - 190's SBP. Options are limited for BP control, will appreciated cardiology's rec's.  After achieving better pain control, patients BP and HR improved on 8/31. Heart Afailure team started coreg on 8/31, which achieved HR control and stabilized BP.  # Orthostatic Hypotension - most likely due to hypovolemia due to diuresis appears to be resolved - Orthostatic VS - normal  - unable to apply TED due to significant LE swelling and erythemas.  - Per Cardiology - hold metoprolol for now   #RLE cellulitis with MSSA bacteremia - Resolved.  Completed a 6 weeks of IV abx to cover for endocarditis.  - Was on Ancef and then cefepime, d/c on 8/31  - Obtain blood cultures - no growth to date  - Xray R foot 7/25 - diffuse soft tissue prominence suspicious for cellulitis  - Right LE Duplex US 7/27 - no evidence of DVT, SVT, or baker's cyst  - LE Arterial Duplex 07/19/2010 - moderate ABI, aortic iliac disease  - Wound care consult - 8/22 No open wound visible. Applied foam dressing and secured with kerlex to absorb drainage and protect from further injury.  -PT consult - recommend home health PT and wide 3n1  - TEE on 08/29 - no evidence of endocarditis  #Urinary retention and UTI  - pt foley d/c and had little output.  Bladder scan showed 686cc on 8/29, and I/O cath returned 1L.  - Restarted foley on 8/29. D/c on 8/31, plan for q 4 hr bladder scan and I/O if > than 400 cc - urine culture NGTD at 48 hours  # Constipation  - lactulose as needed for constipation  #Acute encephalopathy- resolved She was transferred to [facility] care on 10/19/12 for acute encephalopathy and metabolic disturbances.  08/28/ blood gas:  pH 7.471, pCO2- 35.3, pO2- 71.0, Bicarb 25.8.  She is AAO x3 today.   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  Normal magnesium levels. K is 4.2 today. - AM CMP/Mg and replete as necessary  #Metabolic Alkalosis  - contraction alkalosis due to diuretic use vs chronic hypoxic hypercapnia due to COPD  - Monitor bicarb   - Monitor for respiratory depression   #Vaginal bleeding - resolved,  Possibly blood from foley catheter, however physical examination revealed mild blood in vaginal vault. She is s/p hysterectomy.  -Consulted Dr. Erin Fulling over phone who suggested that due to no current active bleeding and difficulty in examination in hospital setting and resolution of bleeding to follow-up as outpatient with her or with her  OBGYN  -PT normal, PTT prolonged (past history of prolonged PTT)  -UA 8/26 - no blood, small LE   #DM2 - controlled, on metformin at home  - HbA1C - 6.7  -CBG AC and QHS  - Lantus 12U at bedtime , Novolog 4U  - Nutrition consult - Obese Class III - no further nutrition intervention   #COPD on home O2 2L - PFTs 03/01/2012 shows moderate obstructive lung defect (FEV1/FVC 63%, RV 126%) and severely decreased diffusion capacity (DLCO 49%), no response to bronchodilators. On home oxygen. Unknown baseline O2 sat.  - Goal O2 sat 92-96%  - Will monitor for signs/symptoms of hypercapnia  - incentive spirometry, upright position  #Hypothyroidism - S/p thyroidectomy. Last TSH 0.153 in 11/2011. 3.1cm soft tissue mass behind [place] joint possible thyroid nodule  - TSH WNL  - Continue home synthroid  - will need outpatient thyroid US  Normocytic Anemia - due to chronic disease (hypothyroidism) vs iron deficiency vs thalassemia vs GI blood loss  - stable - Anemia panel - iron, ferritin, TIBC, vit B12, folate normal, iron sat low  -Blood smear with polychromasia   Thrombocytopenia -chronic since 12/13/11. Suspect due to splenomegaly. In 2012 [place] abd with normal splenomegaly. On 10/13/12 CT chest, slight splenomegaly. Other possibilties include medication  induced, ITP, HIT syndrome, pseuothrombocytopenia, and bone marrow suppression  - improving, 145  -monitor for bleeding   #Elevated d-dimer - Her Wells' score is 3.5, earning points for clinical signs of DVT (asymmetric LE edema, erythema), PE being a likely diagnosis, and immobilization for at least 3 days. This places her in a high risk group with 40.6% chance of PE in an ED population. We will therefore proceed to definitive imaging. Cr 0.70.  -CTA chest - No PE  -CT findings: Small loculated R pleural effusion, probable PAH, hepatosplenomegaly, nodulary of the liver parenchyma c/w cirrhosis   #Chest tightness -resolved, x3weeks, improved after receiving Lasix in the ED. Likely 2/2 dyspnea and pulmonary edema. EKG WNL, but important to rule out coronary ischemia in this patient with significant co-morbidities including obesity, diabetes, heart failure.   - Trending troponins q6h x3 - neg   #Depression - Stable.  - Continue prozac   #Diet - Heart healthy   #DVT PPX - subq Heparin  Dispo: Disposition is deferred at this time, awaiting improvement of current medical problems.  Anticipated discharge in approximately 2-3 day(s).   The patient does have a current PCP Andrey Campanile Elizebeth Koller, MD) and does need an [facility] hospital follow-up appointment after discharge.  The patient does not have transportation limitations that hinder transportation to clinic appointments.  .Services Needed at time of discharge: Y = Yes, Blank = No PT:   OT:   RN:   Equipment:   Other:     LOS: 11 days   Pleas Koch, MD 10/24/2012, 6:43 AM

## 2012-10-24 NOTE — Progress Notes (Signed)
  Date: 10/24/2012  Patient name: [name]  Medical record number: [id number]  Date of birth: 05/19/1945   This patient has been seen and the plan of care was discussed with the house staff. Please see their note for complete details. I concur with their findings with the following additions/corrections: Ms Fasig looks and feels better today. She and her husband are concerned about a "bed sore" on gluteal region. Primary team will ask RN to call when she is getting bathed so that they may look at the area. OT and PT have been consulted.   Burns Spain, MD 10/24/2012, 11:23 AM

## 2012-10-25 DIAGNOSIS — L0291 Cutaneous abscess, unspecified: Secondary | ICD-10-CM

## 2012-10-25 DIAGNOSIS — J4489 Other specified chronic obstructive pulmonary disease: Secondary | ICD-10-CM

## 2012-10-25 DIAGNOSIS — A4901 Methicillin susceptible Staphylococcus aureus infection, unspecified site: Secondary | ICD-10-CM

## 2012-10-25 DIAGNOSIS — L0390 Cellulitis, unspecified: Secondary | ICD-10-CM

## 2012-10-25 DIAGNOSIS — J449 Chronic obstructive pulmonary disease, unspecified: Secondary | ICD-10-CM

## 2012-10-25 DIAGNOSIS — R7881 Bacteremia: Secondary | ICD-10-CM

## 2012-10-25 DIAGNOSIS — J9610 Chronic respiratory failure, unspecified whether with hypoxia or hypercapnia: Secondary | ICD-10-CM

## 2012-10-25 DIAGNOSIS — I739 Peripheral vascular disease, unspecified: Secondary | ICD-10-CM

## 2012-10-25 DIAGNOSIS — I509 Heart failure, unspecified: Secondary | ICD-10-CM

## 2012-10-25 LAB — GLUCOSE, CAPILLARY
Glucose-Capillary: 115 mg/dL — ABNORMAL HIGH (ref 70–99)
Glucose-Capillary: 145 mg/dL — ABNORMAL HIGH (ref 70–99)
Glucose-Capillary: 141 mg/dL — ABNORMAL HIGH (ref 70–99)

## 2012-10-25 LAB — CBC WITH DIFFERENTIAL/PLATELET
Basophils Absolute: 0 10*3/uL (ref 0.0–0.1)
Basophils Relative: 0 % (ref 0–1)
Eosinophils Absolute: 0.2 10*3/uL (ref 0.0–0.7)
Eosinophils Relative: 2 % (ref 0–5)
HCT: 40.2 % (ref 36.0–46.0)
Hemoglobin: 13.2 g/dL (ref 12.0–15.0)
Lymphs Abs: 2.4 10*3/uL (ref 0.7–4.0)
MCH: 28.7 pg (ref 26.0–34.0)
MCHC: 32.8 g/dL (ref 30.0–36.0)
MCV: 87.4 fL (ref 78.0–100.0)
Monocytes Absolute: 1 10*3/uL (ref 0.1–1.0)
Monocytes Relative: 9 % (ref 3–12)
RBC: 4.6 MIL/uL (ref 3.87–5.11)
RDW: 16 % — ABNORMAL HIGH (ref 11.5–15.5)

## 2012-10-25 LAB — BASIC METABOLIC PANEL
BUN: 27 mg/dL — ABNORMAL HIGH (ref 6–23)
Calcium: 9.2 mg/dL (ref 8.4–10.5)
Creatinine, Ser: 0.76 mg/dL (ref 0.50–1.10)
GFR calc non Af Amer: 86 mL/min — ABNORMAL LOW (ref >90)
Potassium: 4 mEq/L (ref 3.5–5.1)

## 2012-10-25 MED ORDER — BISACODYL 10 MG RE SUPP: 10.0000 mg | Freq: Every day | RECTAL | Status: DC | PRN

## 2012-10-25 MED ORDER — BISACODYL 10 MG RE SUPP
10.0000 mg | Freq: Once | RECTAL | Status: DC
  Filled 2012-10-25: qty 1

## 2012-10-25 MED ORDER — METHYLNALTREXONE BROMIDE 12 MG/0.6ML ~~LOC~~ SOLN
12.0000 mg | Freq: Once | SUBCUTANEOUS | Status: AC
  Administered 2012-10-25: 12 mg via SUBCUTANEOUS
  Filled 2012-10-25: qty 0.6

## 2012-10-25 MED ORDER — DOCUSATE SODIUM 50 MG PO CAPS
50.0000 mg | ORAL_CAPSULE | Freq: Two times a day (BID) | ORAL | Status: DC
  Administered 2012-10-25: 50 mg via ORAL
  Filled 2012-10-25 (×2): qty 1

## 2012-10-25 MED ORDER — TORSEMIDE 20 MG PO TABS
40.0000 mg | ORAL_TABLET | Freq: Every day | ORAL | Status: DC
  Administered 2012-10-25 – 2012-10-26 (×2): 40 mg via ORAL
  Filled 2012-10-25 (×3): qty 2

## 2012-10-25 NOTE — Progress Notes (Signed)
Palliative Medicine Team Progress Note   [name] is doing exceptionally well since my initial consult on 8/30. She reports that her pain is under excellent control and she feels better. Patient and family are very pleased with her current improvement. She has been able to get OOB to a chair and has ambulated with a walker. Still no BM but she has resumed a normal diet-will be very important for her to have BM in next 24 hours-this could be contributing to her urinary retention as well.  1. Congestive Heart Failure in setting of Severe Aortic Stenosis and COPD, O2 dependent: Currently she is doing well and seems to be in a place of fluid balance and symptom control. Would maintain current diuretics and if K is low or she need additional diuretic to add Spironolactone given her Liver disease. Per cardiology recs: "Would restart diuretics (torsemide 40 bid) when weight starts climbing again (240 or greater). If weight stays down would send home on torsemide 40 daily.". Currently she does not have a scheduled diuretic and her weight is up 1kg today-will go ahead and start the torsemide daily so we can also check her labs over the next few days and her urine output.   2. Liver Disease, NASH, Cirrhosis: Ammonia elevated-she needs regular laxative and aggressive bowel regimen. Her abdomen si quite tense-suspect ascites vs. Peripheral edema from her heart failure.  3. Constipation: Severe no BM since 8/26 despite BID Lactulose, Senna, and docusate. Will give her Relistor injection for opiate induced constipation and a dulcolax suppository for possible lower impaction.  4. Pain: Maintain her current regimen-I will review her PRN use over next 24 hour and get her on a routine with breakthrough meds- the flector patch is probably also helping the low back pain issues as well.   Disposition:   I recommended [facility] at [facility] to place referral  May also benefit from [facility] care management of her  CHF-may need titration of meds at home  I also referred her to a possible PCP who makes home visits since getting out to the doctor is very challenging for her.  Her husband needs lots of encouragement and support about the discharge planning process-he has many questions about the logistics. He is open to getting as much help at home as possible-he is going to supplement hospice with private duty caregivers.   Time: 35 minutes. Greater than 50%  of this time was spent counseling and coordinating care related to the above assessment and plan.   Anderson Malta, DO Palliative Medicine

## 2012-10-25 NOTE — Progress Notes (Signed)
Pts Iv due to restart at [facility].  2 RNs assessed.  Iv team notified.  [name]

## 2012-10-25 NOTE — Progress Notes (Signed)
Subjective: Feels much better, complaints of constipation, says she has hardly moved bowel since she came here, can not remember the last time. Ambulating now, walked in the hallway, no SOB. Now passing urine voluntarily- 4 times since last night.   Objective: Vital signs in last 24 hours: Filed Vitals:   10/24/12 2301 10/25/12 0003 10/25/12 0310 10/25/12 0741  BP: 136/44 111/43 144/62 136/46  Pulse: 94 93 101 90  Temp: 98.1 F (36.7 C)  98.6 F (37 C) 97.8 F (36.6 C)  TempSrc: Oral  Oral   Resp: 12 13 13 12   Height:      Weight:   239 lb 3.2 oz (108.5 kg)   SpO2: 97% 96% 97% 94%   Weight change: 2 lb 10.3 oz (1.2 kg)  Intake/Output Summary (Last 24 hours) at 10/25/12 1106 Last data filed at 10/25/12 0600  Gross per 24 hour  Intake    570 ml  Output    850 ml  Net   -280 ml   General appearance: alert, cooperative, appears stated age and no distress, on O2 by nasal canula-2L, sitting in chair by the bed, appears comfortable. Head: Normocephalic, without obvious abnormality, atraumatic Eyes: conjunctivae/corneas clear. PERRL, EOM's intact. Neck: Radiation of murmur from aortic stenosis to carotids bilat. Lungs: Moving equal volumes of air,clear to auscultation bilaterally- anteriorly and posteriorly, no crackles or wheezes. Heart: regular rate and rhythm, Systolic murmur loudest in aortic area, radiates to carotids. Abdomen: soft, non-tender; bowel sounds normal; no masses,  no organomegaly Extremities: Nodules on Rt lower extremity, indurated, not tender, doesn't appear inflamed, slightly larger than Lt, Left leg- indurated skin, reduced pulses bilat possibly due to thickened/indurated skin.  Lab Results: Basic Metabolic Panel:  Recent Labs Lab 10/19/12 1300  10/22/12 2200 10/23/12 0650  10/24/12 0700 10/25/12 0424  NA 132*  < >  --  131*  < > 128* 130*  K 2.8*  < >  --  3.2*  < > 4.2 4.0  CL 82*  < >  --  97  < > 97 98  CO2 43*  < >  --  21  < > 22 24    GLUCOSE 202*  < >  --  141*  < > 153* 126*  BUN 28*  < >  --  21  < > 23 27*  CREATININE 1.11*  < >  --  0.61  < > 0.66 0.76  CALCIUM 9.4  < >  --  9.4  < > 9.1 9.2  MG  --   < > 2.1 2.1  --   --   --   PHOS 3.4  --   --   --   --   --   --   < > = values in this interval not displayed. Liver Function Tests:  Recent Labs Lab 10/23/12 0650 10/24/12 0700  AST 53* 60*  ALT 9 14  ALKPHOS 127* 126*  BILITOT 1.2 1.1  PROT 7.9 7.5  ALBUMIN 2.7* 2.6*    Recent Labs Lab 10/24/12 0700  AMMONIA 71*   CBC:  Recent Labs Lab 10/24/12 0700 10/25/12 0424  WBC 12.0* 11.4*  NEUTROABS 8.5* 7.8*  HGB 13.8 13.2  HCT 40.1 40.2  MCV 86.1 87.4  PLT 137* 123*   CBG:  Recent Labs Lab 10/23/12 2239 10/24/12 0738 10/24/12 1227 10/24/12 1644 10/24/12 2111 10/25/12 0741  GLUCAP 135* 148* 141* 122* 144* 115*   Coagulation:  Recent Labs Lab 10/18/12  1205  LABPROT 15.2  INR 1.23   Urinalysis:  Recent Labs Lab 10/20/12 0816 10/23/12 1650  COLORURINE YELLOW AMBER*  LABSPEC 1.017 1.021  PHURINE 8.5* 6.5  GLUCOSEU NEGATIVE NEGATIVE  HGBUR SMALL* NEGATIVE  BILIRUBINUR NEGATIVE NEGATIVE  KETONESUR 15* NEGATIVE  PROTEINUR NEGATIVE NEGATIVE  UROBILINOGEN 1.0 2.0*  NITRITE NEGATIVE NEGATIVE  LEUKOCYTESUR LARGE* SMALL*    Micro Results: Recent Results (from the past 240 hour(s))  CULTURE, BLOOD (ROUTINE X 2)     Status: None   Collection Time    10/20/12 10:00 AM      Result Value Range Status   Specimen Description BLOOD LEFT HAND   Final   Special Requests BOTTLES DRAWN AEROBIC ONLY 10CC   Final   Culture  Setup Time     Final   Value: 10/20/2012 23:47     Performed at [facility]   Culture     Final   Value:        BLOOD CULTURE RECEIVED NO GROWTH TO DATE CULTURE WILL BE HELD FOR 5 DAYS BEFORE ISSUING A FINAL NEGATIVE REPORT     Performed at [facility]   Report Status PENDING   Incomplete  CULTURE, BLOOD (ROUTINE X 2)     Status: None    Collection Time    10/20/12 10:10 AM      Result Value Range Status   Specimen Description BLOOD RIGHT HAND   Final   Special Requests BOTTLES DRAWN AEROBIC ONLY 10CC   Final   Culture  Setup Time     Final   Value: 10/20/2012 23:47     Performed at [facility]   Culture     Final   Value:        BLOOD CULTURE RECEIVED NO GROWTH TO DATE CULTURE WILL BE HELD FOR 5 DAYS BEFORE ISSUING A FINAL NEGATIVE REPORT     Performed at [facility]   Report Status PENDING   Incomplete  URINE CULTURE     Status: None   Collection Time    10/22/12  5:47 PM      Result Value Range Status   Specimen Description URINE, CATHETERIZED   Final   Special Requests NONE   Final   Culture  Setup Time     Final   Value: 10/22/2012 18:24     Performed at [facility] Count     Final   Value: NO GROWTH     Performed at [facility]   Culture     Final   Value: NO GROWTH     Performed at [facility]   Report Status 10/23/2012 FINAL   Final   Studies/Results: No results found. Medications: I have reviewed the patient's current medications. Scheduled Meds: . antiseptic oral rinse  15 mL Mouth Rinse BID  . carvedilol  6.25 mg Oral BID WC  . diclofenac  1 patch Transdermal BID  . DULoxetine  30 mg Oral Daily  . enoxaparin (LOVENOX) injection  40 mg Subcutaneous Q24H  . hydrocerin  1 application Topical Daily  . insulin aspart  0-15 Units Subcutaneous TID WC  . insulin aspart  0-5 Units Subcutaneous QHS  . insulin aspart  4 Units Subcutaneous TID WC  . insulin glargine  12 Units Subcutaneous QHS  . lactulose  20 g Oral BID  . levothyroxine  137 mcg Oral QAC breakfast  . magic mouthwash  5 mL Oral TID  .  metoCLOPramide (REGLAN) injection  5 mg Intravenous Q12H  . oxyCODONE  5 mg Oral Q6H  . pregabalin  25 mg Oral QHS   Continuous Infusions: . sodium chloride Stopped (10/22/12 0925)   PRN Meds:.albuterol, ALPRAZolam, lip balm, ondansetron (ZOFRAN)  IV, oxyCODONE Assessment/Plan:  [name] is a 67 y.o. female PMH moderate aortic stenosis, diastolic CHF (EF 40-98%), COPD (on 2L home O2), chronic venous insufficiency, recent admission from 7/25-7/31/14 for RLE cellulitis complicated by MSSA bacteremia, received Ancef x 6 weeks through a PICC, who presents with shortness of breath and increased oxygen dependency x1 week.   #Acute on Chronic Diastolic CHF exacerbation - Patient doing much better. Weight today- 239Lbs, - 240 LBs 10/22/12.   EF 70-75%- 09/19/2012. - Transfer patient to telemetry from step down.  - Continue strict input and output - Currently not on diuretics- As per HF recs. - Home health needs- Aid, PT, Nursing. - Heart Failure will manage from a distance: Recs as of 8/31:  Volume status is stable off diuretics. BP and HR up. May be due to discomfort or mild withdrawal. Will add low dose carvedilol. Would restart diuretics (torsemide 40 bid) when weight starts climbing again (240 or greater). If weight stays down would send home on torsemide 40 daily.  - Cardiologist Dr. Eden Emms   #RLE cellulitis with MSSA bacteremia - Resolved.  Completed a 6 weeks of IV abx to cover for endocarditis.  - Was on Ancef and then cefepime, d/c on 8/31  - Blood cultures 10/20/2012- showed no growth to date, awaiting final results. - Wound care consult - 8/22 No open wound visible. Applied foam dressing and secured with kerlex to absorb drainage and protect from further injury.   #Urinary retention- Resolved  Pt  currntly voiding voluntarily, 4 times since last night- good amount. - urine culture 10/22/2012 NGTD.   # Constipation  - lactulose as needed for constipation - dulcolax added- 1 suppository daily/PRN.  #Metabolic Alkalosis  - contraction alkalosis due to diuretic use vs chronic hypoxic hypercapnia due to COPD  - Monitor bicarb - stable- 22- 24 in the past 48 hrs. - Monitor for respiratory depression   #DM2 - controlled, on  metformin at home  - HbA1C - 6.7 - CBG- 115 this morning. 115-141 in the past 24hrs. -CBG AC and QHS  - Lantus 12U at bedtime , Novolog 4U  - Nutrition consult - Obese Class III - no further nutrition intervention   #COPD on home O2 2L - PFTs 03/01/2012 shows moderate obstructive lung defect (FEV1/FVC 63%, RV 126%) and severely decreased diffusion capacity (DLCO 49%), no response to bronchodilators. On home oxygen. Unknown baseline O2 sat.  - Goal O2 sat 92-96%  - Will monitor for signs/symptoms of hypercapnia  - incentive spirometry, upright position  #Hypothyroidism - S/p thyroidectomy. Last TSH 0.153 in 11/2011. 3.1cm soft tissue mass behind  joint possible thyroid nodule  - TSH WNL  - Continue home synthroid  - will need outpatient thyroid US   #Depression - Stable.  - Continue prozac  #Diet - Heart healthy  #DVT PPX - subq Heparin  Dispo: Disposition is deferred at this time, awaiting improvement of current medical problems.  Anticipated discharge in approximately 2 day(s).   The patient does have a current PCP Andrey Campanile Elizebeth Koller, MD) and does need an [facility] hospital follow-up appointment after discharge.  The patient does not know have transportation limitations that hinder transportation to clinic appointments.  .Services Needed at time  of discharge: Y = Yes, Blank = No PT:   OT:   RN:   Equipment:   Other:     LOS: 12 days   Kennis Carina, MD 10/25/2012, 11:06 AM

## 2012-10-25 NOTE — Progress Notes (Signed)
Physical Therapy Treatment Patient Details Name: [name] MRN: [id number] DOB: 06/20/1945 Today's Date: 10/25/2012 Time: 4098-1191 PT Time Calculation (min): 32 min  PT Assessment / Plan / Recommendation  History of Present Illness 68 yr old female with severe AS, HFpEF (Chronic diastolic heart failure), morbid obesity Body mass index is 52.67 kg/(m^2)., Chronic respiratory failure due to COPD on home O2, chronic pain syndrome, with SOB and weight gain. She has evidence of acute on chronic diastolic heart failure.  She is currently on treatment for MSSA bacteremia and Right LE cellulitis via PICC line.  She has not been a candidate for TAVR.  She feels slightly better today after being started on IV diuretics, as she had a 20-30 lb weight gain recently.  On 8/26 moved to SDU due to acute encephalopathy and multiple metabolic disturbances.   PT Comments   Pt progressing well towards all goals. Suspect pt to be near baseline however pt reports current level of function to be worse than PTA. Acute PT to con't to follow to progress functional mobility.   Follow Up Recommendations  Home health PT;Supervision/Assistance - 24 hour     Does the patient have the potential to tolerate intense rehabilitation     Barriers to Discharge        Equipment Recommendations       Recommendations for Other Services    Frequency Min 3X/week   Progress towards PT Goals Progress towards PT goals: Progressing toward goals  Plan Current plan remains appropriate    Precautions / Restrictions Precautions Precautions: Fall Precaution Comments: on Home O2 at  [employer] Restrictions Weight Bearing Restrictions: No Other Position/Activity Restrictions: aw   Pertinent Vitals/Pain SpO2 > 95% on 2 LO2 via [place] t/o session    Mobility  Bed Mobility Bed Mobility: Supine to Sit;Sitting - Scoot to Edge of Bed Supine to Sit: 4: Min assist;HOB flat Sitting - Scoot to [place] of Bed: 5: Supervision Details for Bed  Mobility Assistance: assist for trunk elevation, tried to mimic home with HOB flat and no use of bed rail Transfers Transfers: Sit to Stand;Stand to Sit Sit to Stand: 4: Min guard;With upper extremity assist;From bed Stand to Sit: 4: Min guard;With upper extremity assist;With armrests;To chair/3-in-1 Details for Transfer Assistance: VCs for safe hand placement Ambulation/Gait Ambulation/Gait Assistance: 4: Min guard Ambulation Distance (Feet): 50 Feet (x2) Assistive device: Rolling walker Ambulation/Gait Assistance Details: + SOB, increased in R LE pain to 7-8/10 limited distance Gait Pattern: Step-through pattern (decreased step height) Gait velocity: decreased General Gait Details: seated rest break x 4 min btw 50 foot bouts Stairs: No    Exercises General Exercises - Lower Extremity Ankle Circles/Pumps: AROM;Both;10 reps;Seated Long Arc Quad: AROM;Both;10 reps;Seated (with 5 sec hold) Hip ABduction/ADduction: AROM;10 reps;Seated Mini-Sqauts: AROM;10 reps;Standing   PT Diagnosis:    PT Problem List:   PT Treatment Interventions:     PT Goals (current goals can now be found in the care plan section) Acute Rehab PT Goals Patient Stated Goal: to go home  Visit Information  Last PT Received On: 10/25/12 Assistance Needed: +2 (for lines and chair) History of Present Illness: 67 yr old female with severe AS, HFpEF (Chronic diastolic heart failure), morbid obesity Body mass index is 52.67 kg/(m^2)., Chronic respiratory failure due to COPD on home O2, chronic pain syndrome, with SOB and weight gain. She has evidence of acute on chronic diastolic heart failure.  She is currently on treatment for MSSA bacteremia and Right LE cellulitis  via PICC line.  She has not been a candidate for TAVR.  She feels slightly better today after being started on IV diuretics, as she had a 20-30 lb weight gain recently.  On 8/26 moved to SDU due to acute encephalopathy and multiple metabolic disturbances.     Subjective Data  Subjective: Pt received supine in bed agreeable to PT Patient Stated Goal: to go home   Cognition  Cognition Arousal/Alertness: Awake/alert Behavior During Therapy: WFL for tasks assessed/performed Overall Cognitive Status: Within Functional Limits for tasks assessed    Balance     End of Session PT - End of Session Equipment Utilized During Treatment: Gait belt;Oxygen Activity Tolerance: Patient limited by fatigue Patient left: in chair;with call bell/phone within reach   GP     Marcene Brawn 10/25/2012, 8:57 AM  Lewis Shock, PT, DPT Pager #: [phone] Office #: [phone]

## 2012-10-25 NOTE — Evaluation (Signed)
Occupational Therapy Evaluation and Discharge Patient Details Name: [name] MRN: [id number] DOB: [birth year] Today's Date: 10/25/2012 Time: 4098-1191 OT Time Calculation (min): 26 min  OT Assessment / Plan / Recommendation History of present illness 67 yr old female with severe AS, HFpEF (Chronic diastolic heart failure), morbid obesity Body mass index is 52.67 kg/(m^2)., Chronic respiratory failure due to COPD on home O2, chronic pain syndrome, with SOB and weight gain. She has evidence of acute on chronic diastolic heart failure.  She is currently on treatment for MSSA bacteremia and Right LE cellulitis via PICC line.  She has not been a candidate for TAVR.  She feels slightly better today after being started on IV diuretics, as she had a 20-30 lb weight gain recently.  On 8/26 moved to SDU due to acute encephalopathy and multiple metabolic disturbances.   Clinical Impression   This 67 yo female presents with above. Will benefit from the rest of OT at home. Acute OT will sign off.    OT Assessment  Patient needs continued OT Services    Follow Up Recommendations  Home health OT;Supervision/Assistance - 24 hour       Equipment Recommendations  None recommended by OT          Precautions / Restrictions Precautions Precautions: Fall Precaution Comments: on Home O2 at  [employer] Restrictions Weight Bearing Restrictions: No   Pertinent Vitals/Pain RLE pain with movement; pre-medicated    ADL  Eating/Feeding: Independent Where Assessed - Eating/Feeding: Chair Grooming: Set up;Brushing hair Where Assessed - Grooming: Unsupported sitting Upper Body Bathing: Minimal assistance Where Assessed - Upper Body Bathing: Unsupported sitting Lower Body Bathing: Maximal assistance Where Assessed - Lower Body Bathing: Unsupported sit to stand Upper Body Dressing: Moderate assistance Where Assessed - Upper Body Dressing: Unsupported sitting Lower Body Dressing: +1 Total assistance Where  Assessed - Lower Body Dressing: Unsupported sit to stand Toilet Transfer: Min Pension scheme manager Method: Sit to Barista: Bedside commode Toileting - Clothing Manipulation and Hygiene: Moderate assistance Where Assessed - Toileting Clothing Manipulation and Hygiene: Standing Equipment Used: Rolling walker;Gait belt (O2 at 2 liters) Transfers/Ambulation Related to ADLs: Min guard A for all with RW ADL Comments: Pt very willing to try all asked of her today    OT Diagnosis: Generalized weakness  OT Problem List: Decreased strength;Decreased activity tolerance;Decreased knowledge of use of DME or AE;Impaired balance (sitting and/or standing)     Visit Information  Last OT Received On: 10/25/12 Assistance Needed: +2 (for lines and chair) History of Present Illness: 67 yr old female with severe AS, HFpEF (Chronic diastolic heart failure), morbid obesity Body mass index is 52.67 kg/(m^2)., Chronic respiratory failure due to COPD on home O2, chronic pain syndrome, with SOB and weight gain. She has evidence of acute on chronic diastolic heart failure.  She is currently on treatment for MSSA bacteremia and Right LE cellulitis via PICC line.  She has not been a candidate for TAVR.  She feels slightly better today after being started on IV diuretics, as she had a 20-30 lb weight gain recently.  On 8/26 moved to SDU due to acute encephalopathy and multiple metabolic disturbances.       Prior Functioning     Home Living Family/patient expects to be discharged to:: Private residence Living Arrangements: Spouse/significant other Available Help at Discharge: Family;Available 24 hours/day;Personal care attendant Type of Home: House Home Access: Ramped entrance Home Layout: One level Home Equipment: Bedside commode;Walker - 4 wheels;Wheelchair -  manual;Electric scooter Prior Function Level of Independence: Needs assistance Gait / Transfers Assistance Needed: short distance  amulation ~10' with RW ADL's / Homemaking Assistance Needed: Husband and aide (3x week) A with BADLs Comments: Has 2 cats, likes cards.  Communication Communication: No difficulties Dominant Hand: Right         Vision/Perception Vision - History Patient Visual Report: No change from baseline   Cognition  Cognition Arousal/Alertness: Awake/alert Behavior During Therapy: WFL for tasks assessed/performed Overall Cognitive Status: Within Functional Limits for tasks assessed    Extremity/Trunk Assessment Upper Extremity Assessment Upper Extremity Assessment: Overall WFL for tasks assessed     Mobility Bed Mobility Bed Mobility: Supine to Sit;Sitting - Scoot to Edge of Bed Supine to Sit: 4: Min assist;HOB flat Sitting - Scoot to [place] of Bed: 5: Supervision Transfers Transfers: Sit to Stand;Stand to Sit Sit to Stand: 4: Min guard;With upper extremity assist;From bed Stand to Sit: 4: Min guard;With upper extremity assist;With armrests;To chair/3-in-1 Details for Transfer Assistance: VCs for safe hand placement           End of Session OT - End of Session Equipment Utilized During Treatment: Gait belt;Rolling walker;Oxygen Activity Tolerance: Patient tolerated treatment well Patient left: in chair;with call bell/phone within reach    Evette Georges [phone] 10/25/2012, 8:43 AM

## 2012-10-25 NOTE — Progress Notes (Signed)
Pt transferred to room 4E13 from unit 2900 via bed. Pt A&Ox4. Family at bedside. Oriented pt and family to room and unit placed call bell in reach. Pt and family denied concerns. Will continue to monitor pt closely.  Juliane Lack, RN

## 2012-10-26 DIAGNOSIS — E039 Hypothyroidism, unspecified: Secondary | ICD-10-CM

## 2012-10-26 LAB — BASIC METABOLIC PANEL
BUN: 29 mg/dL — ABNORMAL HIGH (ref 6–23)
BUN: 30 mg/dL — ABNORMAL HIGH (ref 6–23)
Calcium: 8.6 mg/dL (ref 8.4–10.5)
Chloride: 94 mEq/L — ABNORMAL LOW (ref 96–112)
Chloride: 96 mEq/L (ref 96–112)
Creatinine, Ser: 0.85 mg/dL (ref 0.50–1.10)
GFR calc Af Amer: >90 mL/min (ref >90)
GFR calc Af Amer: 81 mL/min — ABNORMAL LOW (ref >90)
GFR calc non Af Amer: 70 mL/min — ABNORMAL LOW (ref >90)
Potassium: 2.8 mEq/L — ABNORMAL LOW (ref 3.5–5.1)
Sodium: 131 mEq/L — ABNORMAL LOW (ref 135–145)
Sodium: 135 mEq/L (ref 135–145)

## 2012-10-26 LAB — CULTURE, BLOOD (ROUTINE X 2): Culture: NO GROWTH

## 2012-10-26 LAB — CBC WITH DIFFERENTIAL/PLATELET
Basophils Relative: 0 % (ref 0–1)
Eosinophils Absolute: 0.2 10*3/uL (ref 0.0–0.7)
Hemoglobin: 13.2 g/dL (ref 12.0–15.0)
Lymphs Abs: 2 10*3/uL (ref 0.7–4.0)
MCH: 28.5 pg (ref 26.0–34.0)
MCHC: 33.2 g/dL (ref 30.0–36.0)
MCV: 85.7 fL (ref 78.0–100.0)
Monocytes Relative: 10 % (ref 3–12)
Neutro Abs: 6.5 10*3/uL (ref 1.7–7.7)
Neutrophils Relative %: 67 % (ref 43–77)
Platelets: 118 10*3/uL — ABNORMAL LOW (ref 150–400)
RBC: 4.63 MIL/uL (ref 3.87–5.11)
RDW: 15.4 % (ref 11.5–15.5)

## 2012-10-26 LAB — GLUCOSE, CAPILLARY
Glucose-Capillary: 130 mg/dL — ABNORMAL HIGH (ref 70–99)
Glucose-Capillary: 106 mg/dL — ABNORMAL HIGH (ref 70–99)
Glucose-Capillary: 135 mg/dL — ABNORMAL HIGH (ref 70–99)
Glucose-Capillary: 117 mg/dL — ABNORMAL HIGH (ref 70–99)

## 2012-10-26 MED ORDER — POTASSIUM CHLORIDE CRYS ER 20 MEQ PO TBCR
40.0000 meq | EXTENDED_RELEASE_TABLET | Freq: Once | ORAL | Status: AC
  Administered 2012-10-26: 40 meq via ORAL
  Filled 2012-10-26: qty 2

## 2012-10-26 MED ORDER — POTASSIUM CHLORIDE 10 MEQ/100ML IV SOLN
10.0000 meq | INTRAVENOUS | Status: AC
  Administered 2012-10-26 (×3): 10 meq via INTRAVENOUS
  Filled 2012-10-26 (×4): qty 100

## 2012-10-26 MED ORDER — POTASSIUM CHLORIDE CRYS ER 20 MEQ PO TBCR
40.0000 meq | EXTENDED_RELEASE_TABLET | Freq: Once | ORAL | Status: AC
  Administered 2012-10-26: 10:00:00 40 meq via ORAL
  Filled 2012-10-26: qty 2

## 2012-10-26 MED ORDER — LACTULOSE 10 GM/15ML PO SOLN
20.0000 g | Freq: Two times a day (BID) | ORAL | Status: DC
  Filled 2012-10-26 (×2): qty 30

## 2012-10-26 NOTE — Progress Notes (Signed)
Report has been given to receiving night shift RN.  RN denies any questions or concerns at this time.  Pt appears in no acute distress resting comfortably in the recliner. Nino Glow RN

## 2012-10-26 NOTE — Progress Notes (Signed)
  Date: 10/26/2012  Patient name: SEVYN MARKHAM  Medical record number: [id number]  Date of birth: 1945-10-05   This patient has been seen and the plan of care was discussed with the house staff. Please see their note for complete details. I concur with their findings with the following additions/corrections:  Appreciate palliative care input.  Clinically improved. Replace K.  Continue torsemide. Will need KCl 20 meq PO bid.  Rest per resident note.  Jonah Blue, DO, FACP [facility] Internal Medicine Residency [facility] 10/26/2012, 12:33 PM

## 2012-10-26 NOTE — Progress Notes (Signed)
Subjective: Patient feels better today, no SOB, ambulating in the hallway. On O2 by nasal cannula- 2L. Complaints of some nausea. Moved bowel last night.   Objective: Vital signs in last 24 hours: Filed Vitals:   10/25/12 2241 10/26/12 0442 10/26/12 0945 10/26/12 1152  BP: 139/56 145/47 123/47 136/50  Pulse: 94 99 90 89  Temp: 97.3 F (36.3 C) 97.6 F (36.4 C)    TempSrc: Oral Oral    Resp: 18 19    Height:      Weight:  230 lb 11.2 oz (104.645 kg)    SpO2: 99% 100%     Weight change: -3 lb 4.9 oz (-1.5 kg)  Intake/Output Summary (Last 24 hours) at 10/26/12 1228 Last data filed at 10/26/12 0900  Gross per 24 hour  Intake    600 ml  Output   2895 ml  Net  -2295 ml   female exam-  General appearance: alert, cooperative, appears stated age and no distress, on O2 by nasal canula-2L, sitting in chair by the bed, appears comfortable.  Head: Normocephalic, without obvious abnormality, atraumatic  Eyes: conjunctivae/corneas clear. PERRL, EOM's intact.  Neck: Radiation of murmur from aortic stenosis to carotids bilat.  Lungs: Moving equal volumes of air,some mild wheezing posteriorly, no crackles or wheezes.  Heart: regular rate and rhythm, Systolic murmur loudest in aortic area, radiates to carotids.  Abdomen: soft, obese, non-tender; bowel sounds normal; no masses, no organomegaly  Extremities: Nodules on Rt lower extremity, indurated, not tender, doesn't appear inflamed, slightly larger than Lt, Left leg- indurated skin, reduced pulses bilat possibly due to thickened/indurated skin.   Lab Results: Basic Metabolic Panel:  Recent Labs Lab 10/19/12 1300  10/22/12 2200 10/23/12 0650  10/25/12 0424 10/26/12 0500  NA 132*  < >  --  131*  < > 130* 131*  K 2.8*  < >  --  3.2*  < > 4.0 2.8*  CL 82*  < >  --  97  < > 98 94*  CO2 43*  < >  --  21  < > 24 27  GLUCOSE 202*  < >  --  141*  < > 126* 129*  BUN 28*  < >  --  21  < > 27* 29*  CREATININE 1.11*  < >  --  0.61  < > 0.76  0.79  CALCIUM 9.4  < >  --  9.4  < > 9.2 8.7  MG  --   < > 2.1 2.1  --   --   --   PHOS 3.4  --   --   --   --   --   --   < > = values in this interval not displayed. Liver Function Tests:  Recent Labs Lab 10/23/12 0650 10/24/12 0700  AST 53* 60*  ALT 9 14  ALKPHOS 127* 126*  BILITOT 1.2 1.1  PROT 7.9 7.5  ALBUMIN 2.7* 2.6*    Recent Labs Lab 10/24/12 0700  AMMONIA 71*   CBC:  Recent Labs Lab 10/25/12 0424 10/26/12 0500  WBC 11.4* 9.6  NEUTROABS 7.8* 6.5  HGB 13.2 13.2  HCT 40.2 39.7  MCV 87.4 85.7  PLT 123* 118*   CBG:  Recent Labs Lab 10/24/12 2111 10/25/12 0741 10/25/12 1155 10/25/12 1700 10/25/12 2146 10/26/12 0617  GLUCAP 144* 115* 145* 110* 141* 130*   Urinalysis:  Recent Labs Lab 10/20/12 0816 10/23/12 1650  COLORURINE YELLOW AMBER*  LABSPEC 1.017 1.021  PHURINE  8.5* 6.5  GLUCOSEU NEGATIVE NEGATIVE  HGBUR SMALL* NEGATIVE  BILIRUBINUR NEGATIVE NEGATIVE  KETONESUR 15* NEGATIVE  PROTEINUR NEGATIVE NEGATIVE  UROBILINOGEN 1.0 2.0*  NITRITE NEGATIVE NEGATIVE  LEUKOCYTESUR LARGE* SMALL*   Micro Results: Recent Results (from the past 240 hour(s))  CULTURE, BLOOD (ROUTINE X 2)     Status: None   Collection Time    10/20/12 10:00 AM      Result Value Range Status   Specimen Description BLOOD LEFT HAND   Final   Special Requests BOTTLES DRAWN AEROBIC ONLY 10CC   Final   Culture  Setup Time     Final   Value: 10/20/2012 23:47     Performed at [facility]   Culture     Final   Value: NO GROWTH 5 DAYS     Performed at [facility]   Report Status 10/26/2012 FINAL   Final  CULTURE, BLOOD (ROUTINE X 2)     Status: None   Collection Time    10/20/12 10:10 AM      Result Value Range Status   Specimen Description BLOOD RIGHT HAND   Final   Special Requests BOTTLES DRAWN AEROBIC ONLY 10CC   Final   Culture  Setup Time     Final   Value: 10/20/2012 23:47     Performed at [facility]   Culture     Final   Value:  NO GROWTH 5 DAYS     Performed at [facility]   Report Status 10/26/2012 FINAL   Final  URINE CULTURE     Status: None   Collection Time    10/22/12  5:47 PM      Result Value Range Status   Specimen Description URINE, CATHETERIZED   Final   Special Requests NONE   Final   Culture  Setup Time     Final   Value: 10/22/2012 18:24     Performed at [facility] Count     Final   Value: NO GROWTH     Performed at [facility]   Culture     Final   Value: NO GROWTH     Performed at [facility]   Report Status 10/23/2012 FINAL   Final   Studies/Results: No results found. Medications: I have reviewed the patient's current medications. Scheduled Meds: . antiseptic oral rinse  15 mL Mouth Rinse BID  . bisacodyl  10 mg Rectal Once  . carvedilol  6.25 mg Oral BID WC  . diclofenac  1 patch Transdermal BID  . DULoxetine  30 mg Oral Daily  . enoxaparin (LOVENOX) injection  40 mg Subcutaneous Q24H  . hydrocerin  1 application Topical Daily  . insulin aspart  0-15 Units Subcutaneous TID WC  . insulin aspart  0-5 Units Subcutaneous QHS  . insulin aspart  4 Units Subcutaneous TID WC  . insulin glargine  12 Units Subcutaneous QHS  . levothyroxine  137 mcg Oral QAC breakfast  . magic mouthwash  5 mL Oral TID  . metoCLOPramide (REGLAN) injection  5 mg Intravenous Q12H  . oxyCODONE  5 mg Oral Q6H  . potassium chloride  10 mEq Intravenous Q1 Hr x 4  . pregabalin  25 mg Oral QHS  . torsemide  40 mg Oral Daily   Continuous Infusions: . sodium chloride Stopped (10/22/12 0925)   PRN Meds:.albuterol, ALPRAZolam, lip balm, ondansetron (ZOFRAN) IV, oxyCODONE Assessment/Plan:  [name] is  a 67 y.o. female PMH moderate aortic stenosis, diastolic CHF (EF 16-10%), COPD (on 2L home O2), chronic venous insufficiency, recent admission from 7/25-7/31/14 for RLE cellulitis complicated by MSSA bacteremia, received Ancef x 6 weeks through a PICC, who  presents with shortness of breath and increased oxygen dependency x1 week.   #Acute on Chronic Diastolic CHF exacerbation - Resolved. Patient doing much better. Weight today- 230Lbs, - 240 LBs 10/22/12. EF 70-75%- 09/19/2012. Fliud output in the past 24 hrs- 2315. - Continue strict input and output  - Started on Torsemide- 40mg  PO daily, as weight was 239 yest.  - PT and OT recommend home health PT and OT, orders put in for both.  - Heart Failure will manage from a distance: Recs as of 8/31:  Would restart diuretics (torsemide 40 bid) when weight starts climbing again (240 or greater). If weight stays down would send home on torsemide 40 daily.  - Cardiologist Dr. Eden Emms  - Potassium dropped to 2.8 this morning. IV KCl given, and Oral KCL . Wil recheck BMP later today at 6pm. - Will start KCL BID.   #RLE cellulitis with MSSA bacteremia - Resolved.  Completed a 6 weeks of IV abx to cover for endocarditis.  - Was on Ancef and then cefepime, d/c on 8/31  - Blood cultures 10/20/2012- showed no growth to date, awaiting final results.  - Wound care consult - 8/22 No open wound visible. Applied foam dressing and secured with kerlex to absorb drainage and protect from further injury.  #Urinary retention- Resolved  Pt currntly voiding voluntarily, 4 times since last night- good amount.  - urine culture 10/22/2012 NGTD.  # Constipation- Resolved.  - D/c lactulose.  - Colace- 50mg  BID. #Metabolic Alkalosis -Resolved - contraction alkalosis due to diuretic use vs chronic hypoxic hypercapnia due to COPD  - Monitor bicarb - stable- 24- 27 in the past 48 hrs.  - Monitor for respiratory depression  #DM2 - controlled, on metformin at home  - HbA1C - 6.7  - CBG- 130 this morning. 110-145 in the past 24hrs.  - CBG AC and QHS  - Lantus 12U at bedtime , Novolog 4U  - Nutrition consult - Obese Class III - no further nutrition intervention  #COPD on home O2 2L - PFTs 03/01/2012 shows moderate  obstructive lung defect (FEV1/FVC 63%, RV 126%) and severely decreased diffusion capacity (DLCO 49%), no response to bronchodilators. On home oxygen. Unknown baseline O2 sat.  - Goal O2 sat 92-96%  - Will monitor for signs/symptoms of hypercapnia  - incentive spirometry, upright position  #Hypothyroidism - S/p thyroidectomy. Last TSH 0.153 in 11/2011. 3.1cm soft tissue mass behind [place] joint possible thyroid nodule  - TSH WNL  - Continue home synthroid  - will need outpatient thyroid US  #Depression - Stable.  - Continue prozac  #Diet - Heart healthy  #DVT PPX - subq Heparin  # Code- DNR.   Dispo: Disposition is deferred at this time, awaiting improvement of current medical problems.  Anticipated discharge in approximately 1 day.   The patient does have a current PCP Andrey Campanile Elizebeth Koller, MD) and does need an [facility] hospital follow-up appointment after discharge.  The patient does not have transportation limitations that hinder transportation to clinic appointments.  .Services Needed at time of discharge: Y = Yes, Blank = No PT:   OT:   RN:   Equipment:   Other:     LOS: 13 days   Kennis Carina, MD 10/26/2012,  12:28 PM

## 2012-10-26 NOTE — Progress Notes (Signed)
Notified by Ivonne Andrew CMRN, patient and family request services of [facility]) after discharge.   Spoke with patient and daughter Dannielle Burn at bedside to initiate education related to hospice services, philosophy and team approach to care - they voiced good understanding of information provided.  Per discussion plan is to d/c tomorrow by non-emergent transport  DME needs were discussed- currently patient on O2 @ 2.5 LNC and has O2 in the home through [facility]; she also has a hover-round wheel chair; walker and BSC in the home; at this time the patient declines a hospital bed - she requests an over-bed table only  Patient/family request services of Marletta Lor, NP with [facility] at discharge as the patient has found it very difficult to travel to the doctor's office  Patient's Pharmacy: [facility] or [facility] [address]  Initial paperwork faxed to [facility] Completed d/c summary will need to be faxed to [facility] @ [phone] when final Please notify [facility] when patient is ready to leave unit at d/c call [phone] (or [phone] if after 5 pm);  [facility] information and contact numbers also given to daughter Dannielle Burn during visit.   Above information shared with Ivonne Andrew CMRN Please call with any questions or concerns   Valente David, RN 10/26/2012, 5:23 PM [facility] Palliative Medicine Team RN Liaison [phone]

## 2012-10-26 NOTE — Progress Notes (Signed)
PT Cancellation Note  Patient Details Name: [name] MRN: [id number] DOB: 09/16/1945   Cancelled Treatment:    Pt declining therapy at this time due to feeling nauseous. States she just got back into bed recently.   Pt has received nausea medication per RN.  Will attempt back later today if time allows.     Verdell Face, [place] [phone] 10/26/2012

## 2012-10-26 NOTE — Progress Notes (Signed)
Pt K 2.8 this am.  MD has been made aware.  Orders have been to supplement. Nino Glow RN

## 2012-10-26 NOTE — Progress Notes (Signed)
10/26/12 1500 In to give home hospice choice with pt. and daughter, Stanton Kidney.  After reviewing home hospice agencies, pt. chose [facility].  Spoke with pt. about DME needs, and pt. would like an overbed table.  Will obtain orders.  Pt. did not want hospital bed at this time, and she currently has home continuous oxgyen thru [facility].  Pt. would like an ambulance transport for discharge; will consult CSW for transportation needs.  TC to [facility], with [facility], to give referral.  Pt. to dc home tomorrow.  Tera Mater, RN, BSN NCM [phone]

## 2012-10-27 DIAGNOSIS — I951 Orthostatic hypotension: Secondary | ICD-10-CM

## 2012-10-27 LAB — BASIC METABOLIC PANEL
BUN: 31 mg/dL — ABNORMAL HIGH (ref 6–23)
Chloride: 98 mEq/L (ref 96–112)
GFR calc Af Amer: 80 mL/min — ABNORMAL LOW (ref >90)
Glucose, Bld: 107 mg/dL — ABNORMAL HIGH (ref 70–99)
Potassium: 3.5 mEq/L (ref 3.5–5.1)
Sodium: 136 mEq/L (ref 135–145)

## 2012-10-27 LAB — CBC WITH DIFFERENTIAL/PLATELET
Basophils Absolute: 0 10*3/uL (ref 0.0–0.1)
Basophils Relative: 0 % (ref 0–1)
Eosinophils Absolute: 0.1 10*3/uL (ref 0.0–0.7)
Hemoglobin: 12.8 g/dL (ref 12.0–15.0)
Lymphocytes Relative: 24 % (ref 12–46)
Lymphs Abs: 2 10*3/uL (ref 0.7–4.0)
Monocytes Relative: 11 % (ref 3–12)
Neutro Abs: 5.2 10*3/uL (ref 1.7–7.7)
Neutrophils Relative %: 63 % (ref 43–77)
RBC: 4.48 MIL/uL (ref 3.87–5.11)
WBC: 8.2 10*3/uL (ref 4.0–10.5)

## 2012-10-27 LAB — GLUCOSE, CAPILLARY: Glucose-Capillary: 136 mg/dL — ABNORMAL HIGH (ref 70–99)

## 2012-10-27 MED ORDER — SENNOSIDES-DOCUSATE SODIUM 8.6-50 MG PO TABS: 4.0000 | ORAL_TABLET | Freq: Every day | ORAL | Status: DC

## 2012-10-27 MED ORDER — TORSEMIDE 20 MG PO TABS: 20.0000 mg | ORAL_TABLET | Freq: Every day | ORAL | Status: DC

## 2012-10-27 MED ORDER — SODIUM CHLORIDE 0.9 % IV SOLN
INTRAVENOUS | Status: AC
  Administered 2012-10-27: 13:00:00 via INTRAVENOUS

## 2012-10-27 MED ORDER — TORSEMIDE 20 MG PO TABS
20.0000 mg | ORAL_TABLET | Freq: Every day | ORAL | Status: DC
  Administered 2012-10-27: 20 mg via ORAL

## 2012-10-27 MED ORDER — POTASSIUM CHLORIDE CRYS ER 20 MEQ PO TBCR
20.0000 meq | EXTENDED_RELEASE_TABLET | Freq: Two times a day (BID) | ORAL | Status: DC
  Administered 2012-10-27 – 2012-10-28 (×3): 20 meq via ORAL
  Filled 2012-10-27 (×4): qty 1

## 2012-10-27 MED ORDER — DOCUSATE SODIUM 50 MG PO CAPS
50.0000 mg | ORAL_CAPSULE | Freq: Two times a day (BID) | ORAL | Status: DC
  Administered 2012-10-27 (×2): 50 mg via ORAL
  Filled 2012-10-27 (×5): qty 1

## 2012-10-27 MED ORDER — CARVEDILOL 6.25 MG PO TABS: 6.2500 mg | ORAL_TABLET | Freq: Two times a day (BID) | ORAL | Status: DC

## 2012-10-27 NOTE — Progress Notes (Signed)
  Date: 10/27/2012  Patient name: [name]  Medical record number: [id number]  Date of birth: 10-Dec-1945   This patient has been seen and the plan of care was discussed with the house staff. Please see their note for complete details. I concur with their findings with the following additions/corrections: Complains of dizziness this morning. Noted contraction alkalosis. She is orthostatic and cannot tolerate standing as a result. At this time, stop torsemide. Give 250 cc NS IV over next 5 hours. Then, resume torsemide 20 mg PO daily (half of current dose). Hold discharge today.  Jonah Blue, DO, FACP Faculty [facility] Res[facility] Program 10/27/2012, 11:41 AM

## 2012-10-27 NOTE — Progress Notes (Addendum)
Subjective: Complaints of some dizziness on standing. Husband present at bedside. Ambulating around bed with assistance.    Objective: Vital signs in last 24 hours: Filed Vitals:   10/26/12 1435 10/26/12 1827 10/26/12 2147 10/27/12 0452  BP: 143/51 112/49 110/54 106/31  Pulse: 88 91 88 82  Temp: 97.4 F (36.3 C)  97.8 F (36.6 C) 98.2 F (36.8 C)  TempSrc: Oral  Oral Oral  Resp: 18  18 19   Height:      Weight:    231 lb 1.6 oz (104.826 kg)  SpO2: 100%  100% 99%   Weight change: -4 lb 12.7 oz (-2.174 kg)  Intake/Output Summary (Last 24 hours) at 10/27/12 1047 Last data filed at 10/26/12 2347  Gross per 24 hour  Intake    240 ml  Output   1250 ml  Net  -1010 ml   General appearance: alert, cooperative, appears stated age and no distress, on O2 by nasal canula-2L. Head: Normocephalic, without obvious abnormality, atraumatic. Lungs: clear to auscultation bilaterally. Heart: regular rate and rhythm, systolic murmur in loudest in aortic area radiates to carotids, no click, rub or gallop Abdomen: soft, obese, non-tender; bowel sounds normal; no masses, no organomegaly. Extremities: Rt leg- indurated, woody, Lt leg- induarted skin, atraumatic, no cyanosis or edema. Pulses: 2+, symm.  Lab Results: Basic Metabolic Panel:  Recent Labs Lab 10/23/12 0650  10/26/12 1859 10/27/12 0510  NA 131*  < > 135 136  K 3.2*  < > 3.3* 3.5  CL 97  < > 96 98  CO2 21  < > 25 28  GLUCOSE 141*  < > 167* 107*  BUN 21  < > 30* 31*  CREATININE 0.61  < > 0.85 0.86  CALCIUM 9.4  < > 8.6 8.7  MG 2.1  --  2.0  --   < > = values in this interval not displayed. Liver Function Tests:  Recent Labs Lab 10/23/12 0650 10/24/12 0700  AST 53* 60*  ALT 9 14  ALKPHOS 127* 126*  BILITOT 1.2 1.1  PROT 7.9 7.5  ALBUMIN 2.7* 2.6*    Recent Labs Lab 10/24/12 0700  AMMONIA 71*   CBC:  Recent Labs Lab 10/26/12 0500 10/27/12 0510  WBC 9.6 8.2  NEUTROABS 6.5 5.2  HGB 13.2 12.8  HCT 39.7  38.8  MCV 85.7 86.6  PLT 118* 123*   CBG:  Recent Labs Lab 10/25/12 2146 10/26/12 0617 10/26/12 1135 10/26/12 1637 10/26/12 2154 10/27/12 0623  GLUCAP 141* 130* 106* 135* 117* 96   Urine Drug Screen: Drugs of Abuse     Component Value Date/Time   LABOPIA NEGATIVE 01/15/2009 0117   COCAINSCRNUR NEGATIVE 01/15/2009 0117   LABBENZ  Value: POSITIVE (NOTE) Sent for confirmatory testing Result repeated and verified.* 01/15/2009 0117   AMPHETMU NEGATIVE 01/15/2009 0117    Urinalysis:  Recent Labs Lab 10/23/12 1650  COLORURINE AMBER*  LABSPEC 1.021  PHURINE 6.5  GLUCOSEU NEGATIVE  HGBUR NEGATIVE  BILIRUBINUR NEGATIVE  KETONESUR NEGATIVE  PROTEINUR NEGATIVE  UROBILINOGEN 2.0*  NITRITE NEGATIVE  LEUKOCYTESUR SMALL*   Micro Results: Recent Results (from the past 240 hour(s))  CULTURE, BLOOD (ROUTINE X 2)     Status: None   Collection Time    10/20/12 10:00 AM      Result Value Range Status   Specimen Description BLOOD LEFT HAND   Final   Special Requests BOTTLES DRAWN AEROBIC ONLY 10CC   Final   Culture  Setup Time  Final   Value: 10/20/2012 23:47     Performed at [facility]   Culture     Final   Value: NO GROWTH 5 DAYS     Performed at [facility]   Report Status 10/26/2012 FINAL   Final  CULTURE, BLOOD (ROUTINE X 2)     Status: None   Collection Time    10/20/12 10:10 AM      Result Value Range Status   Specimen Description BLOOD RIGHT HAND   Final   Special Requests BOTTLES DRAWN AEROBIC ONLY 10CC   Final   Culture  Setup Time     Final   Value: 10/20/2012 23:47     Performed at [facility]   Culture     Final   Value: NO GROWTH 5 DAYS     Performed at [facility]   Report Status 10/26/2012 FINAL   Final  URINE CULTURE     Status: None   Collection Time    10/22/12  5:47 PM      Result Value Range Status   Specimen Description URINE, CATHETERIZED   Final   Special Requests NONE   Final   Culture  Setup Time      Final   Value: 10/22/2012 18:24     Performed at [facility] Count     Final   Value: NO GROWTH     Performed at [facility]   Culture     Final   Value: NO GROWTH     Performed at [facility]   Report Status 10/23/2012 FINAL   Final   Studies/Results: No results found. Medications: I have reviewed the patient's current medications. Scheduled Meds: . antiseptic oral rinse  15 mL Mouth Rinse BID  . bisacodyl  10 mg Rectal Once  . carvedilol  6.25 mg Oral BID WC  . diclofenac  1 patch Transdermal BID  . DULoxetine  30 mg Oral Daily  . enoxaparin (LOVENOX) injection  40 mg Subcutaneous Q24H  . hydrocerin  1 application Topical Daily  . insulin aspart  0-15 Units Subcutaneous TID WC  . insulin aspart  0-5 Units Subcutaneous QHS  . insulin aspart  4 Units Subcutaneous TID WC  . insulin glargine  12 Units Subcutaneous QHS  . levothyroxine  137 mcg Oral QAC breakfast  . magic mouthwash  5 mL Oral TID  . metoCLOPramide (REGLAN) injection  5 mg Intravenous Q12H  . oxyCODONE  5 mg Oral Q6H  . potassium chloride  20 mEq Oral BID  . pregabalin  25 mg Oral QHS  . torsemide  20 mg Oral Daily   Continuous Infusions: . sodium chloride Stopped (10/22/12 0925)   PRN Meds:.albuterol, ALPRAZolam, lip balm, ondansetron (ZOFRAN) IV, oxyCODONE Assessment/Plan: [name] is a 67 y.o. female PMH moderate aortic stenosis, diastolic CHF (EF 16-10%), COPD (on 2L home O2), chronic venous insufficiency, recent admission from 7/25-7/31/14 for RLE cellulitis complicated by MSSA bacteremia, received Ancef x 6 weeks through a PICC, who presents with shortness of breath and increased oxygen dependency x1 week.   #Acute on Chronic Diastolic CHF exacerbation - Resolved. Patient doing much better. Weight today- 230Lbs, - 240 LBs 10/22/12. EF 70-75%- 09/19/2012. Fliud output in the past 24 hrs- 1240.  -  Pt blood pressure drooped to 106/31, has dropped since  commencement of torsemide 2 days ago, also pt complains of some dizziness on standing up. Prior-  145/47.  - Orthostatic vitals- low blood pressure, systolic 80s on standing.   - Hold torsemide for today. - IV n/s over 5 hrs. - Home hospice care arranged. Pt will be observed today, possible discharge tomorrow, with close follow up. - Will continue KCL BID.  - Continue strict input and output. #RLE cellulitis with MSSA bacteremia - Resolved.  Completed a 6 weeks of IV abx to cover for endocarditis.  - Was on Ancef and then cefepime, d/c on 8/31  - Blood cultures 10/20/2012- showed no growth in 5 days, awaiting final results.  - Wound care consult - 8/22 No open wound visible. Applied foam dressing and secured with kerlex to absorb drainage and protect from further injury.  #Urinary retention- Resolved  Pt currntly voiding voluntarily. - urine culture 10/22/2012- no growth- Final result.  # Constipation- Resolved.  - Patient said she doesnt want suppository, will continue Colace- 50mg  BID.  #Metabolic Alkalosis -Resolved  - contraction alkalosis due to diuretic use vs chronic hypoxic hypercapnia due to COPD  - Monitor bicarb - stable- 24- 28 in the past 48 hrs.  - Monitor for respiratory depression  #DM2 - controlled, on metformin at home  - HbA1C - 6.7  - CBG- 96 this morning. 96-117 in the past 24hrs.  - CBG AC and QHS  - Lantus 12U at bedtime , Novolog 4U  - Nutrition consult - Obese Class III - no further nutrition intervention  #COPD on home O2 2L - PFTs 03/01/2012 shows moderate obstructive lung defect (FEV1/FVC 63%, RV 126%) and severely decreased diffusion capacity (DLCO 49%), no response to bronchodilators. On home oxygen. Unknown baseline O2 sat.  - Goal O2 sat 92-96%  - Will monitor for signs/symptoms of hypercapnia  #Hypothyroidism - S/p thyroidectomy. Last TSH 0.153 in 11/2011. 3.1cm soft tissue mass behind [place] joint possible thyroid nodule  - TSH WNL  - Continue  home synthroid  - will need outpatient thyroid US  #Depression - Stable.  - Continue prozac  #Diet - Heart healthy  #DVT PPX - subq Heparin  # Code- DNR.   Dispo: Disposition is deferred at this time, awaiting improvement of current medical problems.  Anticipated discharge in approximately to day.   The patient does have a current PCP Andrey Campanile Elizebeth Koller, MD) and does need an [facility] hospital follow-up appointment after discharge.  The patient does have transportation limitations that hinder transportation to clinic appointments.  .Services Needed at time of discharge: Y = Yes, Blank = No PT:   OT:   RN:   Equipment:   Other:     LOS: 14 days   Kennis Carina, MD 10/27/2012, 10:47 AM

## 2012-10-27 NOTE — Progress Notes (Signed)
Orthostatic VS done   126/38  Sitting, 131/49 standing. Pt unaable to tolerate standing any longer. Denies c/o lightheadedness as she did this am.

## 2012-10-27 NOTE — Progress Notes (Signed)
Physical Therapy Treatment Patient Details Name: [name] MRN: [id number] DOB: May 10, 1945 Today's Date: 10/27/2012 Time: 0912-0935 PT Time Calculation (min): 23 min  PT Assessment / Plan / Recommendation  History of Present Illness 67 yr old female with severe AS, HFpEF (Chronic diastolic heart failure), morbid obesity Body mass index is 52.67 kg/(m^2)., Chronic respiratory failure due to COPD on home O2, chronic pain syndrome, with SOB and weight gain. She has evidence of acute on chronic diastolic heart failure.  She is currently on treatment for MSSA bacteremia and Right LE cellulitis via PICC line.  She has not been a candidate for TAVR.  She feels slightly better today after being started on IV diuretics, as she had a 20-30 lb weight gain recently.  On 8/26 moved to SDU due to acute encephalopathy and multiple metabolic disturbances.   PT Comments   Pt pleasant & willing to participate in therapy.  Pt moves fairly well but is limited by SOB & fatigue.  Educated pt on energy conservation & pacing activities.     Follow Up Recommendations  Home health PT;Supervision/Assistance - 24 hour     Does the patient have the potential to tolerate intense rehabilitation     Barriers to Discharge        Equipment Recommendations  Other (comment)    Recommendations for Other Services    Frequency Min 3X/week   Progress towards PT Goals Progress towards PT goals: Progressing toward goals  Plan Current plan remains appropriate    Precautions / Restrictions Restrictions Weight Bearing Restrictions: No   Pertinent Vitals/Pain No pain indicated    Mobility  Bed Mobility Bed Mobility: Not assessed Transfers Transfers: Sit to Stand;Stand to Sit Sit to Stand: 4: Min assist;With armrests;With upper extremity assist;From chair/3-in-1;From toilet Stand to Sit: 4: Min guard;With upper extremity assist;With armrests;To chair/3-in-1;To toilet Details for Transfer Assistance: cues for safe hand  placement & technique Ambulation/Gait Ambulation/Gait Assistance: 4: Min guard (2nd person to follow with recliner due to fatigues quickly) Ambulation Distance (Feet): 70 Feet (20' + 50) Assistive device: Rolling walker Ambulation/Gait Assistance Details: cues for posture & pursed lip breathing.  Pt ambulated recliner<>bathroom & then required lengthy rest break before ambulating into hallway.   Gait Pattern: Step-through pattern;Decreased stride length;Trunk flexed Gait velocity: decreased    Exercises General Exercises - Lower Extremity Ankle Circles/Pumps: AROM;Both;10 reps Long Arc Quad: AROM;Strengthening;Both;10 reps Straight Leg Raises: AAROM;Strengthening;Both;10 reps Hip Flexion/Marching: AROM;Strengthening;Both;10 reps    PT Goals (current goals can now be found in the care plan section) Acute Rehab PT Goals PT Goal Formulation: With patient/family Time For Goal Achievement: 10/31/12 Potential to Achieve Goals: Fair  Visit Information  Last PT Received On: 10/27/12 Assistance Needed: +1 History of Present Illness: 67 yr old female with severe AS, HFpEF (Chronic diastolic heart failure), morbid obesity Body mass index is 52.67 kg/(m^2)., Chronic respiratory failure due to COPD on home O2, chronic pain syndrome, with SOB and weight gain. She has evidence of acute on chronic diastolic heart failure.  She is currently on treatment for MSSA bacteremia and Right LE cellulitis via PICC line.  She has not been a candidate for TAVR.  She feels slightly better today after being started on IV diuretics, as she had a 20-30 lb weight gain recently.  On 8/26 moved to SDU due to acute encephalopathy and multiple metabolic disturbances.    Subjective Data      Cognition  Cognition Arousal/Alertness: Awake/alert Behavior During Therapy: WFL for tasks assessed/performed  Overall Cognitive Status: Within Functional Limits for tasks assessed    Balance     End of Session PT - End of  Session Equipment Utilized During Treatment: Gait belt Activity Tolerance: Patient limited by fatigue Patient left: in chair;with call bell/phone within reach;with family/visitor present Nurse Communication: Mobility status   GP     Lara Mulch 10/27/2012, 10:36 AM  Verdell Face, PTA [phone] 10/27/2012

## 2012-10-27 NOTE — Progress Notes (Signed)
Blood pressure while pt in recliner 117/62. Bp standing 88/60. Unable to tolerate standing for second standing bp.

## 2012-10-28 DIAGNOSIS — I739 Peripheral vascular disease, unspecified: Secondary | ICD-10-CM

## 2012-10-28 LAB — CBC WITH DIFFERENTIAL/PLATELET
Basophils Absolute: 0 10*3/uL (ref 0.0–0.1)
Basophils Relative: 0 % (ref 0–1)
Eosinophils Absolute: 0.2 10*3/uL (ref 0.0–0.7)
Eosinophils Relative: 2 % (ref 0–5)
Hemoglobin: 13.1 g/dL (ref 12.0–15.0)
MCH: 29.2 pg (ref 26.0–34.0)
MCHC: 34.1 g/dL (ref 30.0–36.0)
Monocytes Absolute: 0.8 10*3/uL (ref 0.1–1.0)
Monocytes Relative: 11 % (ref 3–12)
Neutrophils Relative %: 62 % (ref 43–77)
RDW: 15.4 % (ref 11.5–15.5)

## 2012-10-28 LAB — GLUCOSE, CAPILLARY: Glucose-Capillary: 165 mg/dL — ABNORMAL HIGH (ref 70–99)

## 2012-10-28 LAB — BASIC METABOLIC PANEL
BUN: 30 mg/dL — ABNORMAL HIGH (ref 6–23)
Calcium: 8.7 mg/dL (ref 8.4–10.5)
Chloride: 97 mEq/L (ref 96–112)
Creatinine, Ser: 0.83 mg/dL (ref 0.50–1.10)
GFR calc Af Amer: 83 mL/min — ABNORMAL LOW (ref >90)
GFR calc non Af Amer: 72 mL/min — ABNORMAL LOW (ref >90)
Potassium: 3.5 mEq/L (ref 3.5–5.1)
Sodium: 132 mEq/L — ABNORMAL LOW (ref 135–145)

## 2012-10-28 MED ORDER — DICLOFENAC EPOLAMINE 1.3 % TD PTCH: 1.0000 | MEDICATED_PATCH | Freq: Two times a day (BID) | TRANSDERMAL | Status: DC

## 2012-10-28 MED ORDER — OXYCODONE HCL 5 MG PO TABS: 5.0000 mg | ORAL_TABLET | Freq: Four times a day (QID) | ORAL | Status: DC

## 2012-10-28 MED ORDER — DULOXETINE HCL 30 MG PO CPEP: 30.0000 mg | ORAL_CAPSULE | Freq: Every day | ORAL | Status: DC

## 2012-10-28 MED ORDER — DOCUSATE SODIUM 50 MG PO CAPS: 50.0000 mg | ORAL_CAPSULE | Freq: Two times a day (BID) | ORAL | Status: DC

## 2012-10-28 NOTE — Progress Notes (Addendum)
Subjective: No complaints today. Feels much better. No dizziness or SOB on standing and walking around bed.  Objective: Vital signs in last 24 hours: Filed Vitals:   10/27/12 1822 10/27/12 1824 10/27/12 2038 10/28/12 0624  BP: 126/38 131/49 102/35 122/42  Pulse:   84 89  Temp:   97.4 F (36.3 C) 98.2 F (36.8 C)  TempSrc:   Axillary Oral  Resp:   18 18  Height:      Weight:    230 lb 3.2 oz (104.418 kg)  SpO2:   100% 100%   Weight change: -14.4 oz (-0.408 kg)  Intake/Output Summary (Last 24 hours) at 10/28/12 1320 Last data filed at 10/28/12 1248  Gross per 24 hour  Intake    832 ml  Output    750 ml  Net     82 ml  Physical exam -  General appearance: alert, cooperative, appears stated age and no distress, on O2 by nasal canula-2L.  Head: Normocephalic, without obvious abnormality, atraumatic.  Lungs: clear to auscultation bilaterally.  Heart: regular rate and rhythm, systolic murmur in loudest in aortic area radiates to carotids, no click, rub or gallop  Abdomen: soft, obese, non-tender; bowel sounds normal; no masses, no organomegaly.  Extremities: Rt leg- indurated, woody, Lt leg- induarted skin, atraumatic, no cyanosis or edema.  Pulses: 2+, symm.   Lab Results: Basic Metabolic Panel:  Recent Labs Lab 10/23/12 0650  10/26/12 1859 10/27/12 0510 10/28/12 0515  NA 131*  < > 135 136 132*  K 3.2*  < > 3.3* 3.5 3.5  CL 97  < > 96 98 97  CO2 21  < > 25 28 26   GLUCOSE 141*  < > 167* 107* 105*  BUN 21  < > 30* 31* 30*  CREATININE 0.61  < > 0.85 0.86 0.83  CALCIUM 9.4  < > 8.6 8.7 8.7  MG 2.1  --  2.0  --   --   < > = values in this interval not displayed. Liver Function Tests:  Recent Labs Lab 10/23/12 0650 10/24/12 0700  AST 53* 60*  ALT 9 14  ALKPHOS 127* 126*  BILITOT 1.2 1.1  PROT 7.9 7.5  ALBUMIN 2.7* 2.6*    Recent Labs Lab 10/24/12 0700  AMMONIA 71*   CBC:  Recent Labs Lab 10/27/12 0510 10/28/12 0515  WBC 8.2 6.9  NEUTROABS 5.2  4.3  HGB 12.8 13.1  HCT 38.8 38.4  MCV 86.6 85.7  PLT 123* 133*    Recent Labs Lab 10/27/12 0623 10/27/12 1104 10/27/12 1623 10/27/12 2121 10/28/12 0554 10/28/12 1137  GLUCAP 96 136* 117* 104* 91 77   Urine Drug Screen: Drugs of Abuse     Component Value Date/Time   LABOPIA NEGATIVE 01/15/2009 0117   COCAINSCRNUR NEGATIVE 01/15/2009 0117   LABBENZ  Value: POSITIVE (NOTE) Sent for confirmatory testing Result repeated and verified.* 01/15/2009 0117   AMPHETMU NEGATIVE 01/15/2009 0117    Urinalysis:  Recent Labs Lab 10/23/12 1650  COLORURINE AMBER*  LABSPEC 1.021  PHURINE 6.5  GLUCOSEU NEGATIVE  HGBUR NEGATIVE  BILIRUBINUR NEGATIVE  KETONESUR NEGATIVE  PROTEINUR NEGATIVE  UROBILINOGEN 2.0*  NITRITE NEGATIVE  LEUKOCYTESUR SMALL*   Micro Results: Recent Results (from the past 240 hour(s))  CULTURE, BLOOD (ROUTINE X 2)     Status: None   Collection Time    10/20/12 10:00 AM      Result Value Range Status   Specimen Description BLOOD LEFT HAND   Final  Special Requests BOTTLES DRAWN AEROBIC ONLY 10CC   Final   Culture  Setup Time     Final   Value: 10/20/2012 23:47     Performed at [facility]   Culture     Final   Value: NO GROWTH 5 DAYS     Performed at [facility]   Report Status 10/26/2012 FINAL   Final  CULTURE, BLOOD (ROUTINE X 2)     Status: None   Collection Time    10/20/12 10:10 AM      Result Value Range Status   Specimen Description BLOOD RIGHT HAND   Final   Special Requests BOTTLES DRAWN AEROBIC ONLY 10CC   Final   Culture  Setup Time     Final   Value: 10/20/2012 23:47     Performed at [facility]   Culture     Final   Value: NO GROWTH 5 DAYS     Performed at [facility]   Report Status 10/26/2012 FINAL   Final  URINE CULTURE     Status: None   Collection Time    10/22/12  5:47 PM      Result Value Range Status   Specimen Description URINE, CATHETERIZED   Final   Special Requests NONE   Final    Culture  Setup Time     Final   Value: 10/22/2012 18:24     Performed at [facility] Count     Final   Value: NO GROWTH     Performed at [facility]   Culture     Final   Value: NO GROWTH     Performed at [facility]   Report Status 10/23/2012 FINAL   Final   Studies/Results: No results found. Medications: I have reviewed the patient's current medications. Scheduled Meds: . antiseptic oral rinse  15 mL Mouth Rinse BID  . bisacodyl  10 mg Rectal Once  . carvedilol  6.25 mg Oral BID WC  . diclofenac  1 patch Transdermal BID  . docusate sodium  50 mg Oral BID  . DULoxetine  30 mg Oral Daily  . enoxaparin (LOVENOX) injection  40 mg Subcutaneous Q24H  . hydrocerin  1 application Topical Daily  . insulin aspart  0-15 Units Subcutaneous TID WC  . insulin aspart  0-5 Units Subcutaneous QHS  . insulin aspart  4 Units Subcutaneous TID WC  . insulin glargine  12 Units Subcutaneous QHS  . levothyroxine  137 mcg Oral QAC breakfast  . magic mouthwash  5 mL Oral TID  . metoCLOPramide (REGLAN) injection  5 mg Intravenous Q12H  . oxyCODONE  5 mg Oral Q6H  . potassium chloride  20 mEq Oral BID  . pregabalin  25 mg Oral QHS   Continuous Infusions: . sodium chloride Stopped (10/22/12 0925)   PRN Meds:.albuterol, ALPRAZolam, lip balm, ondansetron (ZOFRAN) IV, oxyCODONE Assessment/Plan: Principal Problem:   Acute exacerbation of CHF (congestive heart failure) Active Problems:   Type I (juvenile type) diabetes mellitus with peripheral circulatory disorders, not stated as uncontrolled(250.71)   Hyperglycemia   Alkalemia   Chronic respiratory failure   Metabolic alkalosis  #Acute on Chronic Diastolic CHF exacerbation - Resolved. Patient doing much better. Weight yesterday- 230Lbs, - 240 LBs 10/22/12. EF 70-75%- 09/19/2012. Fliud output in the past 24 hrs- 410.  - Pt blood pressure drooped to 106/31, has dropped since commencement of torsemide 2 days ago, also  pt complains  of some dizziness on standing up. Prior- 145/47.  Orthostatic vitals showed - low blood pressure, systolic 80s on standing. So patient was given Iv n/s over 5 hrs, with dramatic improvement, also pt got 20mg  of torsemide.  - Orthostatics done after IV n/s- sitting 121/36 PR- 88 to 131/49 standing.  - Home hospice care arranged. Pt discharge today, with close follow up.  - Will continue KCL daily on discharge, counselled to stop medication for a day if dizziness develops, and be sure to follow up with appointments. - Appointment with PCP and [facility] set up within the month.  #RLE cellulitis with MSSA bacteremia - Resolved.  Completed a 6 weeks of IV abx to cover  # Constipation- Resolved.  - Patient said she doesnt want suppository, will continue Colace- 50mg  BID. - Discharge on colace 50mg  BID.   #Metabolic Alkalosis -Resolved  - contraction alkalosis due to diuretic use vs chronic hypoxic hypercapnia due to COPD  - Monitor bicarb - stable- 24- 28 in the past 48 hrs.   #DM2 - controlled, on metformin at home  - HbA1C - 6.7  - CBG AC and QHS  - Lantus 12U at bedtime , Novolog 4U   #COPD on home O2 2L - PFTs 03/01/2012 shows moderate obstructive lung defect (FEV1/FVC 63%, RV 126%) and severely decreased diffusion capacity (DLCO 49%). On home oxygen.  - Goal O2 sat 92-96%   #Hypothyroidism - S/p thyroidectomy. Last TSH 0.153 in 11/2011. 3.1cm soft tissue mass behind [place] joint possible thyroid nodule  - TSH WNL  - Continue home synthroid  - will need outpatient thyroid US   #Depression - Stable.  - D/c home on cymbalta.  #Diet - Heart healthy   #DVT PPX - subq Heparin   # Code- DNR.   Dispo: Disposition is deferred at this time, awaiting improvement of current medical problems.  Anticipated discharge in approximately to day.   The patient does have a current PCP Andrey Campanile Elizebeth Koller, MD) and does need an [facility] hospital follow-up appointment  after discharge.  The patient does have transportation limitations that hinder transportation to clinic appointments.  .Services Needed at time of discharge: Y = Yes, Blank = No PT:   OT:   RN:   Equipment:   Other:     LOS: 15 days   Kennis Carina, MD 10/28/2012, 1:20 PM

## 2012-10-28 NOTE — Progress Notes (Signed)
  Date: 10/28/2012  Patient name: [name]  Medical record number: [id number]  Date of birth: 09-02-1945   This patient has been seen and the plan of care was discussed with the house staff. Please see their note for complete details. I concur with their findings with the following additions/corrections: Feels much better today. No longer orthostatic.  I agree with decreasing torsemide to 20 mg po daily as outpatient. She can follow up as an outpatient.   Medically stable for D/C home with home services/home hospice care.  Jonah Blue, DO, FACP [facility] Residency [facility] 10/28/2012, 1:43 PM

## 2012-10-28 NOTE — Discharge Summary (Signed)
Name: [name] MRN: [id number] DOB: 1945/09/06 67 y.o. PCP: [name], MD  Date of Admission: 10/13/2012  1:17 PM Date of Discharge: 10/28/2012 Attending Physician: Jonah Blue, DO  Discharge Diagnosis:  Principal Problem:   Acute exacerbation of CHF (congestive heart failure) Active Problems:   Type I (juvenile type) diabetes mellitus with peripheral circulatory disorders, not stated as uncontrolled(250.71)   Hyperglycemia   Alkalemia   Chronic respiratory failure   Metabolic alkalosis  Discharge Medications:   Medication List    STOP taking these medications       ceFAZolin 2-3 GM-% Solr  Commonly known as:  ANCEF     fluconazole 100 MG tablet  Commonly known as:  DIFLUCAN     furosemide 80 MG tablet  Commonly known as:  LASIX     lactulose 10 GM/15ML solution  Commonly known as:  CHRONULAC     metoprolol succinate 25 MG 24 hr tablet  Commonly known as:  TOPROL-XL     senna-docusate 8.6-50 MG per tablet  Commonly known as:  Senokot-S     sodium chloride 0.9 % injection     spironolactone 25 MG tablet  Commonly known as:  ALDACTONE      TAKE these medications       acetaminophen 325 MG tablet  Commonly known as:  TYLENOL  Take 650 mg by mouth every 6 (six) hours as needed (headache).     ALPRAZolam 1 MG tablet  Commonly known as:  XANAX  Take 1 mg by mouth 3 (three) times daily as needed for anxiety.     aspirin 325 MG tablet  Take 1 tablet (325 mg total) by mouth daily.     carvedilol 6.25 MG tablet  Commonly known as:  COREG  Take 1 tablet (6.25 mg total) by mouth 2 (two) times daily with a meal.     docusate sodium 50 MG capsule  Commonly known as:  COLACE  Take 1 capsule (50 mg total) by mouth 2 (two) times daily.     famotidine 20 MG tablet  Commonly known as:  PEPCID  Take 1 tablet (20 mg total) by mouth daily. For indigestion     FLUoxetine 20 MG capsule  Commonly known as:  PROZAC  Take 20 mg by mouth daily.       hydrocerin Crea  Apply 1 application topically daily.     levothyroxine 137 MCG tablet  Commonly known as:  SYNTHROID, LEVOTHROID  Take 137 mcg by mouth daily.     metFORMIN 1000 MG tablet  Commonly known as:  GLUCOPHAGE  Take 1,000 mg by mouth 2 (two) times daily with a meal.     morphine 15 MG tablet  Commonly known as:  MSIR  Take 15 mg by mouth every 4 (four) hours as needed. pain     morphine 60 MG 12 hr tablet  Commonly known as:  MS CONTIN  Take 60 mg by mouth every 12 (twelve) hours.     potassium chloride 10 MEQ tablet  Commonly known as:  K-DUR,KLOR-CON  Take 20 mEq by mouth 2 (two) times daily.     torsemide 20 MG tablet  Commonly known as:  DEMADEX  Take 1 tablet (20 mg total) by mouth daily.        Disposition and follow-up:   [name] was discharged from [facility] in [place] condition.  At the hospital follow up visit please address:  1.  Tolerance  of BP meds and diuretics, initiated during admission- Torsemide and Carvedilol.  2.  Basal metabolic profile and orthostatic vitals.   3. Follow up of a possible 3.1cm thyroid nodule seen on CTA done 10/13/2012.   Follow-up Appointments:     Follow-up Information   Follow up with [facility] ([facility] to follow aftr d/c please notify whn transport on unit & pt ready to d/c call [phone] (Or [phone] aftr 5 pm))    Contact information:   [facility] [address] [place], [place] [phone]      Please follow up.   Contact information:   [address] [place] [address] [phone]       Follow up with [name], MD On 11/01/2012. (12noon)    Specialty:  Family Medicine   Contact information:   [address] [place] [address] [phone]       Follow up with Tereso Newcomer, PA-C On 11/11/2012. (3.20pm)    Specialty:  Physician Assistant   Contact information:   [address] [place]  [address] [phone]       Discharge Instructions: Discharge Orders   Future Appointments Provider Department Dept Phone   11/09/2012 10:45 AM Cliffton Asters, MD [facility] [phone]   11/11/2012 3:20 PM Beatrice Lecher, PA-C [facility] Main Office [place]) [phone]   Future Orders Complete By Expires   (HEART FAILURE PATIENTS) Call MD:  Anytime you have any of the following symptoms: 1) 3 pound weight gain in 24 hours or 5 pounds in 1 week 2) shortness of breath, with or without a dry hacking cough 3) swelling in the hands, feet or stomach 4) if you have to sleep on extra pillows at night in order to breathe.  As directed    (HEART FAILURE PATIENTS) Call MD:  Anytime you have any of the following symptoms: 1) 3 pound weight gain in 24 hours or 5 pounds in 1 week 2) shortness of breath, with or without a dry hacking cough 3) swelling in the hands, feet or stomach 4) if you have to sleep on extra pillows at night in order to breathe.  As directed    Call MD for:  difficulty breathing, headache or visual disturbances  As directed    Call MD for:  extreme fatigue  As directed    Call MD for:  persistant dizziness or light-headedness  As directed    Call MD for:  persistant dizziness or light-headedness  As directed    Call MD for:  temperature >100.4  As directed    Diet - low sodium heart healthy  As directed    Diet - low sodium heart healthy  As directed    Discharge instructions  As directed    Comments:     Please take your medications as prescribed. You will be followed up closely to ensure the dose for your blood pressure and water pills are adequate for you. Please avoid salty foods, and reduce the amount of salt in your daily meals, as this can cause fluid to build up in your body. Also avoid fatty and fried meals.   Discharge instructions  As directed    Comments:     We will be discharging you on a new medication called torsemide, it is  a type of water pill. We want you to take it once a day. If you experience dizziness or low blood pressures you  should stop the medication for a day and then continue. You will no longer be taking lasix. Avoid salt in your diet. Also we have scheduled appointments for you to seen by your heart doctor and Primary care physician. Please be sure to keep up with them.   Increase activity slowly  As directed    Increase activity slowly  As directed       Consultations: Treatment Team:  Palliative [facility], Heart failure team. ICU admission.  Procedures Performed:  Ct Abdomen Pelvis Wo Contrast  10/19/2012   *RADIOLOGY REPORT*  Clinical Data: Suspected abdominal sepsis  CT ABDOMEN AND PELVIS WITHOUT CONTRAST  Technique:  Multidetector CT imaging of the abdomen and pelvis was performed following the standard protocol without intravenous contrast.  Comparison: Chest CT - 10/13/2012  Findings:  The lack of intravenous contrast limits the ability to evaluate solid abdominal organs.  There is nodularity of the hepatic contour suggestive of cirrhosis. This finding is associated with splenomegaly and hypertrophied venous collaterals within the splenic hilum and along the left mid abdomen extending to into the right lower pelvis.  No ascites.  Post cholecystectomy.  There is mild dilatation of the common bile duct measuring 11 mm in greatest coronal dimension, presumably sequelae of postcholecystectomy state.  No ascites.  Normal noncontrast appearance of the bilateral kidneys.  No discrete renal stones.  No urinary obstruction. The urinary bladder is decompressed with a Foley catheter.  Normal noncontrast appearance of the bilateral adrenal glands and pancreas.  The bowel is normal in course and caliber without wall thickening. Moderate colonic stool burden without evidence of obstruction.  The appendix is not visualized, however there is no inflammatory change within the right lower abdominal quadrant.   No pneumoperitoneum, pneumatosis or portal venous gas.  Scattered atherosclerotic plaque with a normal caliber abdominal aorta.  Scattered shoddy retroperitoneal lymph nodes are not enlarged by CT criteria.  No definite retroperitoneal, mesenteric, pelvic or inguinal lymphadenopathy on this noncontrast examination.  Post hysterectomy.  No discrete adnexal lesion.  No free fluid within the pelvis.  Limited visualization of the lower thorax demonstrates trace bilateral effusions with associated partial atelectasis of the bilateral lower lobes, right greater than left.  Cardiomegaly.  Calcifications within the aortic valve leaflets.  No pericardial effusion.  No acute or aggressive osseous abnormalities.  Mild to moderate multilevel lumbar spine DDD, worse at L5 - S1 with disc space height loss, end plate irregularity and sclerosis.  There is eventration of the caudal aspect of the rectus abdominous musculature.  There are two adjacent mesenteric fat containing periumbilical hernias.  IMPRESSION:  1.  Findings within the abdomen or pelvis on this noncontrast examination.  Specifically, no evidence of enteric or urinary obstruction.  2.  Trace bilateral pleural effusions with partial atelectasis of the bilateral lower lobes, right greater than left, though note, underlying infection not excluded.  3.  Cardiomegaly with calcifications within the aortic valve leaflets. 4.  Findings compatible with cirrhosis, splenomegaly and portal venous hypertension with hypertrophied splenic collaterals extending along the left mid hemiabdomen into the right hemi pelvis.  No ascites.   Original Report Authenticated By: Tacey Ruiz, MD   Dg Chest 2 View  10/13/2012   CLINICAL DATA:  68 year old female with shortness of breath. Cellulitis.  EXAM: CHEST  2 VIEW  COMPARISON:  09/17/2012 and earlier.  FINDINGS: Right PICC line in place, tip at the level of the lower SVC. Small right pleural effusion new or increased since 09/17/2012.  Diffuse increased interstitial opacity also progressed. No pneumothorax. No consolidation. Stable cardiomegaly and mediastinal contours. Stable visualized osseous structures.  IMPRESSION: 1.  Small right pleural effusion is new/ increased.  2. Increased interstitial markings, favor pulmonary interstitial edema. Viral/ atypical infection is less likely.  3. Chronic cardiomegaly.  4. Right PICC line in place, tip at the lower SVC level.   Electronically Signed   By: Augusto Gamble   On: 10/13/2012 15:55   Ct Angio Chest Pe W/cm &/or Wo Cm  10/14/2012   *RADIOLOGY REPORT*  Clinical Data: Shortness of breath and cough.  Hypoxia.  CT ANGIOGRAPHY CHEST  Technique:  Multidetector CT imaging of the chest using the standard protocol during bolus administration of intravenous contrast. Multiplanar reconstructed images including MIPs were obtained and reviewed to evaluate the vascular anatomy.  Contrast: OMNIPAQUE IOHEXOL 350 MG/ML SOLN  Comparison: Chest x-ray dated 10/13/2012  Findings: There is cardiomegaly.  There is a small loculated right pleural effusion.  There is pulmonary vascular congestion of the origin of the main pulmonary arteries suggestive of pulmonary arterial hypertension.    No pulmonary emboli.   There are a few scattered lymph nodes in the mediastinum and left hilum, and none of which are pathologically enlarged.  There is a 3.1 cm soft tissue mass extending behind the right sternoclavicular joint which probably represents a thyroid nodule.  The patient appears to have had previous thyroid surgery on the right and left.  There is slight splenomegaly.  The patient has hepatomegaly with slight nodularity of the liver contour suggesting cirrhosis.  IMPRESSION: 1.  No pulmonary emboli. 2.  Cardiomegaly with right pleural effusion and pulmonary vascular congestion and probable pulmonary arterial hypertension. 3.  Hepatosplenomegaly.  Nodularity of the liver parenchyma consistent with cirrhosis.   Original  Report Authenticated By: Francene Boyers, M.D.   [name] WU Contrast  10/22/2012   *RADIOLOGY REPORT*  Clinical Data: Encephalopathy.  Recent staph aureus bacteremia.  MRI HEAD WITHOUT AND WITH CONTRAST  Technique:  Multiplanar, multiecho pulse sequences of the brain and surrounding structures were obtained according to standard protocol without and with intravenous contrast  Contrast: 20mL MULTIHANCE GADOBENATE DIMEGLUMINE 529 MG/ML IV SOLN  Comparison: CT head without contrast 12/11/2011.  Findings: The fusion and images demonstrate no evidence for acute infarct or focal abnormality.  Scattered periventricular subcortical T2 hyperintensities are present bilaterally.  No significant cortical or confluent subcortical signal abnormality is present.  The postcontrast images are somewhat distorted by patient motion. No pathologic enhancement is present.  Flow is present in the major intracranial arteries.  The globes and orbits are intact.  The paranasal sinuses and mastoid air cells are clear.  IMPRESSION:  1.  Scattered periventricular sub cortical T2 hyperintensities bilaterally. The finding is nonspecific but can be seen in the setting of chronic microvascular ischemia, a demyelinating process such as multiple sclerosis, vasculitis, complicated migraine headaches, or as the sequelae of a prior infectious or inflammatory process. 2.  No acute intracranial abnormality or pathologic enhancement to suggest septic emboli.   Original Report Authenticated By: Marin Roberts, M.D.   Dg Chest Port 1 View  10/22/2012   *RADIOLOGY REPORT*  Clinical Data: Assess edema  PORTABLE CHEST - 1 VIEW  Comparison: 10/21/2012  Findings: There is improved lung aeration.  The irregularly thickened interstitial markings have decreased and more confluent lung base opacity has improved.  The cardiac silhouette is normal in size.  No mediastinal or hilar masses are appreciated.  IMPRESSION:  Improved lung aeration since the  previous day's study consistent with improved pulmonary edema.   Original Report Authenticated By: Amie Portland, M.D.   Dg Chest Port 1 View  10/21/2012   *RADIOLOGY REPORT*  Clinical Data: Shortness of breath.  PORTABLE CHEST - 1 VIEW  Comparison: 10/20/2012.  Findings: The right PICC line has been removed.  Persistent low lung volumes with vascular crowding and atelectasis.  There is also vascular congestion and mild interstitial edema.  Probable small pleural effusions.  IMPRESSION:  1.  Removal of right PICC line. 2.  Slight worsening lung aeration in part due to lower lung volumes.  Slight worsening pulmonary edema.   Original Report Authenticated By: Rudie Meyer, M.D.   Dg Chest Port 1 View  10/20/2012   *RADIOLOGY REPORT*  Clinical Data: Fever with diastolic heart failure  PORTABLE CHEST - 1 VIEW  Comparison: 10/19/2012  Findings: Stable cardiac enlargement.  PICC line stable.  The perihilar vessels are indistinct and there is vascular congestion with peribronchial cuffing.  There is mild interstitial change similar to the prior study.  Hazy bilateral lower lobe attenuation also similar.  IMPRESSION: Stable mild pulmonary edema.  Hazy bilateral lower lobe densities may represent a combination of small effusions and atelectasis.   Original Report Authenticated By: Esperanza Heir, M.D.   Dg Chest Port 1 View  10/19/2012   *RADIOLOGY REPORT*  Clinical Data: Respiratory distress  PORTABLE CHEST - 1 VIEW  Comparison: 10/13/2012  Findings: Cardiomediastinal silhouette is stable. Stable right arm PICC line position.  Residual mild interstitial prominence bilaterally without convincing pulmonary edema.  Trace right pleural effusion with right basilar atelectasis.  No segmental infiltrate.  IMPRESSION: Residual mild interstitial prominence bilaterally without convincing pulmonary edema.  Trace right pleural effusion with right basilar atelectasis.  No segmental infiltrate.   Original Report Authenticated By:  Natasha Mead, M.D.   Dg Abd Portable 1v  10/21/2012   *RADIOLOGY REPORT*  Clinical Data: Abdominal pain  PORTABLE ABDOMEN - 1 VIEW  Comparison: CT scan 10/19/2012  Findings: Moderate colonic stool is noted.  There is mild gaseous distended small bowel loop in mid abdomen.  Ileus or enteritis cannot be excluded. Mild dextroscoliosis and mild degenerative changes lumbar spine.  The study is limited by patient's large body habitus.  IMPRESSION: Mild gaseous distended small bowel loop in mid abdomen.  Ileus or enteritis cannot be excluded.   Original Report Authenticated By: Natasha Mead, M.D.   Admission HPI: History of Present Illness:  [name] is a 67 y.o. female PMH moderate aortic stenosis, diastolic CHF (EF 45-40%), COPD (on 2L home O2), chronic venous insufficiency, recent admission from 7/25-7/31/14 for RLE cellulitis complicated by MSSA bacteremia, receiving Ancef x 6 weeks through a PICC, who presents with shortness of breath and increased oxygen dependency.  [name] has noted increased shortness of breath for the past 3 weeks, especially on exertion. Her symptoms began soon after she was discharged from the hospital on 09/23/12. Her dyspnea was manageable with her home O2 @ 2L until about 1 week ago, when she began requiring more. She is now up to 4.5L continuous O2. Last night her oxygen tank was accidentally disconnected, and she became very dyspneic and distressed until her husband could fix it. This prompted her to seek emergency care today.  She also notes chest "tightness", non-radiating, for about 3 weeks, though she is not experiencing it presently. (I saw her s/p 80mg  IV Lasix.) She has baseline orthopnea requiring her  to sleep upright in a chair. Denies PND. With regards to her leg swelling, she states it has improved since starting antibiotic treatment for her cellulitis; however, she does have edematous, erythematous legs at baseline 2/2 chronic venous insufficiency. Denies fever,  chills. Denies pain or warmth in her legs, but notes they are somewhat crampy at present. She thinks this is because her potassium is low, as she has had this problem before after taking Lasix. Denies nausea, vomiting, diarrhea, but notes a sensation of fullness and bloating in her abdomen. She also endorses a 20 pound weight gain over the past 2 weeks. Of note, chart review suggests this number is much higher; she is 272lbs presently, increased from 210lbs on 09/20/12. She has been very sedentary since her discharge at the end of July. No personal history of DVT or PE. Physical Exam:  Blood pressure 130/60, pulse 93, temperature 98 F (36.7 C), temperature source Oral, resp. rate 18, height 5' (1.524 m), weight 269 lb 11.2 oz (122.335 kg), SpO2 95.00%.  Physical Exam  Constitutional: She is oriented to person, place, and time and well-developed, well-nourished, and in no distress.  Pleasant and conversant. Not wearing [place] when I walked into the room; O2 sat checked and was 80%. After placing [place] back on, O2 sat quickly increased to 88%. Pt notes baseline is "in the 90's".  HENT:  Head: Normocephalic and atraumatic.  Mouth/Throat: Oropharynx is clear and moist.  Eyes: Conjunctivae and EOM are normal. Pupils are equal, round, and reactive to light.  Neck: Normal range of motion. Neck supple.  Unable to accurately assess JVD 2/2 body habitus.  Cardiovascular: Normal rate, regular rhythm and intact distal pulses.  Murmur (Grade III systolic crescendo-decrescendo murmur heard best at right 2nd intercostal space, radiating to the carotids) heard. 2+ pitting edema to the knees  Pulmonary/Chest: Effort normal. No respiratory distress. She has no wheezes. She has rales (Soft crackles at both bases, R>L). She exhibits no tenderness.  Abdominal: Soft. Bowel sounds are normal. She exhibits no distension. There is no tenderness.  Musculoskeletal: Normal range of motion. She exhibits no tenderness (No calf  tenderness).  Neurological: She is alert and oriented to person, place, and time. No cranial nerve deficit. GCS score is 15.  Skin: Skin is warm and dry. She is not diaphoretic.  Erythematous skin changes and stasis dermatitis in BL legs, R>L. Right leg is more swollen than left, also has multiple skin-colored nodules and 1 clear vesicle on the anterior portion that pt says developed after she hit her leg against her wheelchair a few days ago. No drainage, no pain, no warmth. PICC in right arm; site is clean, dry, intact.  Psychiatric: Affect normal.    Hospital Course by problem list: Principal Problem:   Acute exacerbation of CHF (congestive heart failure) Active Problems:   Type I (juvenile type) diabetes mellitus with peripheral circulatory disorders, not stated as uncontrolled(250.71)   Hyperglycemia   Alkalemia   Chronic respiratory failure   Metabolic alkalosis   # Acute on chronic diastolic heart failure - Acute on chronic diastolic heart failure was the most likely cause of dyspnea in this patient with an over 30 pound lb weight gain in ~3 weeks, signs of fluid overload on exam including crackles and LE edema, O2 sats - 95% on 4.5L of O2, CXR showing pulmonary interstitial edema, and an increased pro-BNP=694, from 197.8- ago .Echo on 09/19/12 showed moderate LVH, hyperdynamic systolic function with EF 70-75%, no wall motion  abnormalities, moderate aortic stenosis 2/2 calcified annulus. PE was R/o with negative CTA. Pt was monitored on Tele, given Iv lasix Infusion, BMP and Mg were monitored BID, and placed on O2 by nasal canula, Strict input and output and the heart failure team was consulted, and was co-managed. The goal was to diurese pt until 1st sign of contraction alkalosis or Cr starts to bump. With increasing bicarbonate and, Diamox was added to the treatment regimen, but lasix infusion was continued as pt still was still fluid overloaded. With development of metabolic  alkalosis and worsing lethargy, Pt was transferred to step down and then PCCM on the 6th day of admission- 10/19/2012, TEE was done to R/o SBE, in the setting of cellulitis, was negative for vegetations. Palliative medicine was also consulted to help establish patients goals of care. Pt was transferred back from ICU- 10/22/2012. Patient was discharged 5 days after transfer from PCCM, patient weighed 230s, patient felt signif better, was saturating-100% on 2L of O2,  BMP showed no worrisome abnormalities. Pt was discharged home with Hospice care, and on KCl ( previous Hx of hypokalemia, with normal Mg ) and torsemide 20mg  daily.   # Moderate Aortic Stenosis - Patient with moderate AS on echo 08/2012 due to calcified annulus who is not a candidate for aortic valve replacement due to comorbidities.   # DM2 - Patient was placed on insulin 4u TID, blood sugars - monitored and mostly controlled on admission. HbA1c on 8/21 was 6.7.   Was discharged home on previous homemeds- Metformin- 1000mg  BID.   # Chronic Respiratory failure- Patient has a hx of diastolic congestive heart failure, with EF- 70 done on this admssion and hx of COPD. Both conditions likely contributing to patients chronic respiratory failure. PFTs 03/01/2012 shows moderate obstructive lung defect (FEV1/FVC 63%, RV 126%) and severely decreased diffusion capacity (DLCO 49%), no response to bronchodilators. On discharge, patient reported marked improvement of symptoms. Was discharged home on Torsemide- 20mg  daily, and carvedilol 6.25 BID.  # Thrombocytopenia - Patent with chronic low platlet levels since 2013. Most likely due to liver cirrhosis and splenomegaly on CTA chest imaging.  #RLE cellulitis with MSSA bacteremia - Improving. On 6 weeks of IV abx to cover for endocarditis.  - Continue home Ancef via PICC  - Continue home MS Contin bid   #Hypothryoidism - S/p thyroidectomy. Last TSH WNL at 1.202 done 10/14/2012. Synthroid- continued on  admission and on discharge.   # Morbid Obesity (Obesity Class III) - Patient with BMI 50.43 with 20-30 lb weight gain on admission due to CHF exacerbation. Nutrition was consulted for nutritional education regarding weight loss.  #Depression - Mood Stable. Was placed on cymbalta on admission, records show she was on prozac before admission, patient was adamant about cymbalta been continued on discharge, insisting this was her new out-pt prescription, not prozac.   Discharge Vitals:   BP 122/42  Pulse 89  Temp(Src) 98.2 F (36.8 C) (Oral)  Resp 18  Ht 5' (1.524 m)  Wt 230 lb 3.2 oz (104.418 kg)  BMI 44.96 kg/m2  SpO2 100%  Discharge Labs:  Results for orders placed during the hospital encounter of 10/13/12 (from the past 24 hour(s))  GLUCOSE, CAPILLARY     Status: Abnormal   Collection Time    10/27/12  4:23 PM      Result Value Range   Glucose-Capillary 117 (*) 70 - 99 mg/dL   Comment 1 Documented in Chart     Comment 2  Notify RN    GLUCOSE, CAPILLARY     Status: Abnormal   Collection Time    10/27/12  9:21 PM      Result Value Range   Glucose-Capillary 104 (*) 70 - 99 mg/dL   Comment 1 Notify RN    CBC WITH DIFFERENTIAL     Status: Abnormal   Collection Time    10/28/12  5:15 AM      Result Value Range   WBC 6.9  4.0 - 10.5 K/uL   RBC 4.48  3.87 - 5.11 MIL/uL   Hemoglobin 13.1  12.0 - 15.0 g/dL   HCT 08.6  57.8 - 46.9 %   MCV 85.7  78.0 - 100.0 fL   MCH 29.2  26.0 - 34.0 pg   MCHC 34.1  30.0 - 36.0 g/dL   RDW 62.9  52.8 - 41.3 %   Platelets 133 (*) 150 - 400 K/uL   Neutrophils Relative % 62  43 - 77 %   Neutro Abs 4.3  1.7 - 7.7 K/uL   Lymphocytes Relative 25  12 - 46 %   Lymphs Abs 1.7  0.7 - 4.0 K/uL   Monocytes Relative 11  3 - 12 %   Monocytes Absolute 0.8  0.1 - 1.0 K/uL   Eosinophils Relative 2  0 - 5 %   Eosinophils Absolute 0.2  0.0 - 0.7 K/uL   Basophils Relative 0  0 - 1 %   Basophils Absolute 0.0  0.0 - 0.1 K/uL  BASIC METABOLIC PANEL     Status:  Abnormal   Collection Time    10/28/12  5:15 AM      Result Value Range   Sodium 132 (*) 135 - 145 mEq/L   Potassium 3.5  3.5 - 5.1 mEq/L   Chloride 97  96 - 112 mEq/L   CO2 26  19 - 32 mEq/L   Glucose, Bld 105 (*) 70 - 99 mg/dL   BUN 30 (*) 6 - 23 mg/dL   Creatinine, Ser 2.44  0.50 - 1.10 mg/dL   Calcium 8.7  8.4 - 01.0 mg/dL   GFR calc non Af Amer 72 (*) >90 mL/min   GFR calc Af Amer 83 (*) >90 mL/min  GLUCOSE, CAPILLARY     Status: None   Collection Time    10/28/12  5:54 AM      Result Value Range   Glucose-Capillary 91  70 - 99 mg/dL  GLUCOSE, CAPILLARY     Status: None   Collection Time    10/28/12 11:37 AM      Result Value Range   Glucose-Capillary 77  70 - 99 mg/dL   Comment 1 Notify RN      Signed: Kennis Carina, MD 10/28/2012, 1:21 PM   Time Spent on Discharge: 40 minutes Services Ordered on Discharge: Home Hospice.

## 2012-10-28 NOTE — Progress Notes (Signed)
Physical Therapy Treatment Patient Details Name: [name] MRN: [id number] DOB: 02-03-1946 Today's Date: 10/28/2012 Time: 1010-1030 PT Time Calculation (min): 20 min  PT Assessment / Plan / Recommendation  History of Present Illness 67 yr old female with severe AS, HFpEF (Chronic diastolic heart failure), morbid obesity Body mass index is 52.67 kg/(m^2)., Chronic respiratory failure due to COPD on home O2, chronic pain syndrome, with SOB and weight gain. She has evidence of acute on chronic diastolic heart failure.  She is currently on treatment for MSSA bacteremia and Right LE cellulitis via PICC line.  She has not been a candidate for TAVR.  She feels slightly better today after being started on IV diuretics, as she had a 20-30 lb weight gain recently.  On 8/26 moved to SDU due to acute encephalopathy and multiple metabolic disturbances.   PT Comments   Pt able to increase ambulation distance however continues to need seated rest breaks.  Continue to recommend HHPT.    Follow Up Recommendations  Home health PT;Supervision/Assistance - 24 hour     Equipment Recommendations  Other (comment) (wide 3n1)    Frequency Min 3X/week   Progress towards PT Goals Progress towards PT goals: Progressing toward goals  Plan Current plan remains appropriate    Precautions / Restrictions Precautions Precautions: Fall Precaution Comments: on Home O2 at  [employer] Restrictions Weight Bearing Restrictions: No   Pertinent Vitals/Pain No c/o pain; VSS    Mobility  Bed Mobility Bed Mobility: Not assessed Transfers Transfers: Sit to Stand;Stand to Sit Sit to Stand: 5: Supervision;From chair/3-in-1;With armrests Stand to Sit: 5: Supervision;To chair/3-in-1;With armrests Details for Transfer Assistance: Supervision for safety and cues for correct RW placement prior to sitting Ambulation/Gait Ambulation/Gait Assistance: 5: Supervision Ambulation Distance (Feet): 120 Feet (seated rest break at  60') Assistive device: Rolling walker Ambulation/Gait Assistance Details: VCs for for upright posture and RW placement.  Seated rest break needed Gait Pattern: Step-through pattern;Decreased stride length;Trunk flexed Gait velocity: decreased    Exercises     PT Diagnosis:    PT Problem List:   PT Treatment Interventions:     PT Goals (current goals can now be found in the care plan section) Acute Rehab PT Goals Patient Stated Goal: to go home PT Goal Formulation: With patient/family Time For Goal Achievement: 10/31/12 Potential to Achieve Goals: Fair  Visit Information  Last PT Received On: 10/28/12 Assistance Needed: +1 History of Present Illness: 67 yr old female with severe AS, HFpEF (Chronic diastolic heart failure), morbid obesity Body mass index is 52.67 kg/(m^2)., Chronic respiratory failure due to COPD on home O2, chronic pain syndrome, with SOB and weight gain. She has evidence of acute on chronic diastolic heart failure.  She is currently on treatment for MSSA bacteremia and Right LE cellulitis via PICC line.  She has not been a candidate for TAVR.  She feels slightly better today after being started on IV diuretics, as she had a 20-30 lb weight gain recently.  On 8/26 moved to SDU due to acute encephalopathy and multiple metabolic disturbances.    Subjective Data  Subjective: I'll try to walk today. Patient Stated Goal: to go home   Cognition  Cognition Arousal/Alertness: Awake/alert Behavior During Therapy: WFL for tasks assessed/performed Overall Cognitive Status: Within Functional Limits for tasks assessed    Balance     End of Session PT - End of Session Equipment Utilized During Treatment: Gait belt Activity Tolerance: Patient limited by fatigue Patient left: in chair;with call  bell/phone within reach;with family/visitor present Nurse Communication: Mobility status   GP     Dak Szumski 10/28/2012, 11:30 AM  Jake Shark, PT DPT [phone]

## 2012-10-28 NOTE — Progress Notes (Signed)
Per MD- patient will be d/c home today and will be followed by [facility]. Patient and husband are pleased about going home and request EMS transport home. EMS will be arranged this afternoon.  Patient's nurse- Tifffany is aware of above.  No further CSW needs identified. CSW signing off.  Lorri Frederick. Deanta Mincey, LCSWA [phone].

## 2012-11-01 NOTE — Discharge Summary (Signed)
  Date: 11/01/2012  Patient name: [name]  Medical record number: [id number]  Date of birth: 07-Aug-1945   This patient has been discussed with the house staff. Please see their note for complete details. I concur with their findings and plan.  Jonah Blue, DO, FACP Faculty [facility] 11/01/2012, 7:44 AM

## 2012-11-02 ENCOUNTER — Ambulatory Visit: Payer: Medicare Other | Admitting: Physician Assistant

## 2012-11-02 ENCOUNTER — Other Ambulatory Visit: Payer: Self-pay

## 2012-11-02 MED ORDER — POTASSIUM CHLORIDE CRYS ER 10 MEQ PO TBCR: 20.0000 meq | EXTENDED_RELEASE_TABLET | Freq: Two times a day (BID) | ORAL | Status: DC

## 2012-11-09 ENCOUNTER — Ambulatory Visit: Payer: Medicare Other | Admitting: Internal Medicine

## 2012-11-11 ENCOUNTER — Encounter: Payer: Medicare Other | Admitting: Physician Assistant

## 2012-12-31 ENCOUNTER — Encounter: Payer: Self-pay | Admitting: Internal Medicine

## 2013-02-17 ENCOUNTER — Encounter (HOSPITAL_COMMUNITY): Payer: Self-pay | Admitting: Emergency Medicine

## 2013-02-17 ENCOUNTER — Inpatient Hospital Stay (HOSPITAL_COMMUNITY)
Admission: EM | Admit: 2013-02-17 | Discharge: 2013-02-19 | DRG: 637 | Disposition: A | Discharge status: Hospice | Attending: Internal Medicine | Admitting: Internal Medicine

## 2013-02-17 ENCOUNTER — Emergency Department (HOSPITAL_COMMUNITY)

## 2013-02-17 DIAGNOSIS — R29898 Other symptoms and signs involving the musculoskeletal system: Secondary | ICD-10-CM

## 2013-02-17 DIAGNOSIS — E874 Mixed disorder of acid-base balance: Secondary | ICD-10-CM | POA: Diagnosis present

## 2013-02-17 DIAGNOSIS — F329 Major depressive disorder, single episode, unspecified: Secondary | ICD-10-CM

## 2013-02-17 DIAGNOSIS — F17200 Nicotine dependence, unspecified, uncomplicated: Secondary | ICD-10-CM | POA: Diagnosis present

## 2013-02-17 DIAGNOSIS — R0989 Other specified symptoms and signs involving the circulatory and respiratory systems: Secondary | ICD-10-CM

## 2013-02-17 DIAGNOSIS — G8929 Other chronic pain: Secondary | ICD-10-CM | POA: Diagnosis present

## 2013-02-17 DIAGNOSIS — D649 Anemia, unspecified: Secondary | ICD-10-CM | POA: Diagnosis present

## 2013-02-17 DIAGNOSIS — E1059 Type 1 diabetes mellitus with other circulatory complications: Secondary | ICD-10-CM

## 2013-02-17 DIAGNOSIS — E039 Hypothyroidism, unspecified: Secondary | ICD-10-CM | POA: Diagnosis present

## 2013-02-17 DIAGNOSIS — G9340 Encephalopathy, unspecified: Secondary | ICD-10-CM

## 2013-02-17 DIAGNOSIS — Z515 Encounter for palliative care: Secondary | ICD-10-CM

## 2013-02-17 DIAGNOSIS — N390 Urinary tract infection, site not specified: Secondary | ICD-10-CM

## 2013-02-17 DIAGNOSIS — K7460 Unspecified cirrhosis of liver: Secondary | ICD-10-CM | POA: Diagnosis present

## 2013-02-17 DIAGNOSIS — M129 Arthropathy, unspecified: Secondary | ICD-10-CM | POA: Diagnosis present

## 2013-02-17 DIAGNOSIS — E869 Volume depletion, unspecified: Secondary | ICD-10-CM | POA: Diagnosis present

## 2013-02-17 DIAGNOSIS — Z8249 Family history of ischemic heart disease and other diseases of the circulatory system: Secondary | ICD-10-CM

## 2013-02-17 DIAGNOSIS — E860 Dehydration: Secondary | ICD-10-CM

## 2013-02-17 DIAGNOSIS — R5383 Other fatigue: Secondary | ICD-10-CM

## 2013-02-17 DIAGNOSIS — L0390 Cellulitis, unspecified: Secondary | ICD-10-CM | POA: Diagnosis present

## 2013-02-17 DIAGNOSIS — I359 Nonrheumatic aortic valve disorder, unspecified: Secondary | ICD-10-CM | POA: Diagnosis present

## 2013-02-17 DIAGNOSIS — E871 Hypo-osmolality and hyponatremia: Secondary | ICD-10-CM

## 2013-02-17 DIAGNOSIS — I509 Heart failure, unspecified: Secondary | ICD-10-CM | POA: Diagnosis present

## 2013-02-17 DIAGNOSIS — E861 Hypovolemia: Secondary | ICD-10-CM | POA: Diagnosis present

## 2013-02-17 DIAGNOSIS — I10 Essential (primary) hypertension: Secondary | ICD-10-CM | POA: Diagnosis present

## 2013-02-17 DIAGNOSIS — L02419 Cutaneous abscess of limb, unspecified: Secondary | ICD-10-CM | POA: Diagnosis present

## 2013-02-17 DIAGNOSIS — E873 Alkalosis: Secondary | ICD-10-CM | POA: Diagnosis present

## 2013-02-17 DIAGNOSIS — I5033 Acute on chronic diastolic (congestive) heart failure: Secondary | ICD-10-CM

## 2013-02-17 DIAGNOSIS — R7881 Bacteremia: Secondary | ICD-10-CM

## 2013-02-17 DIAGNOSIS — R739 Hyperglycemia, unspecified: Secondary | ICD-10-CM

## 2013-02-17 DIAGNOSIS — E876 Hypokalemia: Secondary | ICD-10-CM | POA: Diagnosis present

## 2013-02-17 DIAGNOSIS — Z66 Do not resuscitate: Secondary | ICD-10-CM | POA: Diagnosis present

## 2013-02-17 DIAGNOSIS — Z794 Long term (current) use of insulin: Secondary | ICD-10-CM

## 2013-02-17 DIAGNOSIS — I739 Peripheral vascular disease, unspecified: Secondary | ICD-10-CM | POA: Diagnosis present

## 2013-02-17 DIAGNOSIS — I872 Venous insufficiency (chronic) (peripheral): Secondary | ICD-10-CM | POA: Diagnosis present

## 2013-02-17 DIAGNOSIS — E872 Acidosis: Secondary | ICD-10-CM

## 2013-02-17 DIAGNOSIS — N289 Disorder of kidney and ureter, unspecified: Secondary | ICD-10-CM

## 2013-02-17 DIAGNOSIS — J9610 Chronic respiratory failure, unspecified whether with hypoxia or hypercapnia: Secondary | ICD-10-CM

## 2013-02-17 DIAGNOSIS — J9620 Acute and chronic respiratory failure, unspecified whether with hypoxia or hypercapnia: Secondary | ICD-10-CM | POA: Diagnosis present

## 2013-02-17 DIAGNOSIS — E0789 Other specified disorders of thyroid: Secondary | ICD-10-CM | POA: Diagnosis present

## 2013-02-17 DIAGNOSIS — T502X5A Adverse effect of carbonic-anhydrase inhibitors, benzothiadiazides and other diuretics, initial encounter: Secondary | ICD-10-CM | POA: Diagnosis present

## 2013-02-17 DIAGNOSIS — D696 Thrombocytopenia, unspecified: Secondary | ICD-10-CM

## 2013-02-17 DIAGNOSIS — I5032 Chronic diastolic (congestive) heart failure: Secondary | ICD-10-CM | POA: Diagnosis present

## 2013-02-17 DIAGNOSIS — E1101 Type 2 diabetes mellitus with hyperosmolarity with coma: Principal | ICD-10-CM | POA: Diagnosis present

## 2013-02-17 DIAGNOSIS — J4489 Other specified chronic obstructive pulmonary disease: Secondary | ICD-10-CM | POA: Diagnosis present

## 2013-02-17 DIAGNOSIS — G459 Transient cerebral ischemic attack, unspecified: Secondary | ICD-10-CM

## 2013-02-17 DIAGNOSIS — Z825 Family history of asthma and other chronic lower respiratory diseases: Secondary | ICD-10-CM

## 2013-02-17 DIAGNOSIS — R609 Edema, unspecified: Secondary | ICD-10-CM

## 2013-02-17 DIAGNOSIS — M48061 Spinal stenosis, lumbar region without neurogenic claudication: Secondary | ICD-10-CM

## 2013-02-17 DIAGNOSIS — F3289 Other specified depressive episodes: Secondary | ICD-10-CM | POA: Diagnosis present

## 2013-02-17 DIAGNOSIS — I959 Hypotension, unspecified: Secondary | ICD-10-CM

## 2013-02-17 DIAGNOSIS — J449 Chronic obstructive pulmonary disease, unspecified: Secondary | ICD-10-CM

## 2013-02-17 DIAGNOSIS — Z87442 Personal history of urinary calculi: Secondary | ICD-10-CM

## 2013-02-17 DIAGNOSIS — E870 Hyperosmolality and hypernatremia: Secondary | ICD-10-CM | POA: Diagnosis present

## 2013-02-17 LAB — CG4 I-STAT (LACTIC ACID): Lactic Acid, Venous: 1.82 mmol/L (ref 0.5–2.2)

## 2013-02-17 LAB — COMPREHENSIVE METABOLIC PANEL
Albumin: 2.9 g/dL — ABNORMAL LOW (ref 3.5–5.2)
Alkaline Phosphatase: 148 U/L — ABNORMAL HIGH (ref 39–117)
BUN: 32 mg/dL — ABNORMAL HIGH (ref 6–23)
Chloride: 77 mEq/L — ABNORMAL LOW (ref 96–112)
GFR calc Af Amer: 42 mL/min — ABNORMAL LOW (ref >90)
Glucose, Bld: 474 mg/dL — ABNORMAL HIGH (ref 70–99)
Potassium: <2 mEq/L — CL (ref 3.5–5.1)
Total Bilirubin: 1.3 mg/dL — ABNORMAL HIGH (ref 0.3–1.2)
Total Protein: 7.1 g/dL (ref 6.0–8.3)

## 2013-02-17 LAB — URINALYSIS, ROUTINE W REFLEX MICROSCOPIC
Glucose, UA: 500 mg/dL — AB
Hgb urine dipstick: NEGATIVE
Ketones, ur: NEGATIVE mg/dL
Leukocytes, UA: NEGATIVE
Nitrite: NEGATIVE
Protein, ur: NEGATIVE mg/dL

## 2013-02-17 LAB — GLUCOSE, CAPILLARY
Glucose-Capillary: 440 mg/dL — ABNORMAL HIGH (ref 70–99)
Glucose-Capillary: 451 mg/dL — ABNORMAL HIGH (ref 70–99)

## 2013-02-17 LAB — CBC WITH DIFFERENTIAL/PLATELET
Basophils Absolute: 0 10*3/uL (ref 0.0–0.1)
Eosinophils Absolute: 0.1 10*3/uL (ref 0.0–0.7)
Hemoglobin: 8.9 g/dL — ABNORMAL LOW (ref 12.0–15.0)
Lymphocytes Relative: 17 % (ref 12–46)
Lymphs Abs: 1.2 10*3/uL (ref 0.7–4.0)
MCV: 79.7 fL (ref 78.0–100.0)
Monocytes Relative: 6 % (ref 3–12)
Neutro Abs: 5.5 10*3/uL (ref 1.7–7.7)
Neutrophils Relative %: 76 % (ref 43–77)
RBC: 3.55 MIL/uL — ABNORMAL LOW (ref 3.87–5.11)

## 2013-02-17 LAB — POCT I-STAT 3, ART BLOOD GAS (G3+)
Acid-Base Excess: 23 mmol/L — ABNORMAL HIGH (ref 0.0–2.0)
Bicarbonate: 48.1 mEq/L — ABNORMAL HIGH (ref 20.0–24.0)
O2 Saturation: 92 %

## 2013-02-17 LAB — MAGNESIUM: Magnesium: 2.3 mg/dL (ref 1.5–2.5)

## 2013-02-17 MED ORDER — LEVOTHYROXINE SODIUM 137 MCG PO TABS
137.0000 ug | ORAL_TABLET | Freq: Every day | ORAL | Status: DC
  Administered 2013-02-18 – 2013-02-19 (×2): 137 ug via ORAL
  Filled 2013-02-17 (×3): qty 1

## 2013-02-17 MED ORDER — INSULIN GLARGINE 100 UNIT/ML ~~LOC~~ SOLN
30.0000 [IU] | Freq: Every day | SUBCUTANEOUS | Status: DC
  Administered 2013-02-17 – 2013-02-18 (×2): 30 [IU] via SUBCUTANEOUS
  Filled 2013-02-17 (×3): qty 0.3

## 2013-02-17 MED ORDER — SODIUM CHLORIDE 0.9 % IJ SOLN
3.0000 mL | Freq: Two times a day (BID) | INTRAMUSCULAR | Status: DC
  Administered 2013-02-17 – 2013-02-18 (×3): 3 mL via INTRAVENOUS

## 2013-02-17 MED ORDER — ONDANSETRON HCL 4 MG PO TABS: 4.0000 mg | ORAL_TABLET | Freq: Four times a day (QID) | ORAL | Status: DC | PRN

## 2013-02-17 MED ORDER — OXYCODONE HCL ER 10 MG PO T12A
20.0000 mg | EXTENDED_RELEASE_TABLET | Freq: Two times a day (BID) | ORAL | Status: DC | PRN
  Administered 2013-02-18: 20 mg via ORAL
  Filled 2013-02-17: qty 2

## 2013-02-17 MED ORDER — OXYCODONE HCL 5 MG PO TABS
5.0000 mg | ORAL_TABLET | Freq: Four times a day (QID) | ORAL | Status: DC | PRN
  Administered 2013-02-18 – 2013-02-19 (×2): 5 mg via ORAL
  Filled 2013-02-17 (×2): qty 1

## 2013-02-17 MED ORDER — POTASSIUM CHLORIDE IN NACL 40-0.9 MEQ/L-% IV SOLN
INTRAVENOUS | Status: DC
  Administered 2013-02-17 – 2013-02-18 (×2): via INTRAVENOUS
  Filled 2013-02-17 (×7): qty 1000

## 2013-02-17 MED ORDER — HYDROCERIN EX CREA
1.0000 "application " | TOPICAL_CREAM | CUTANEOUS | Status: DC
  Administered 2013-02-18: 1 via TOPICAL
  Filled 2013-02-17: qty 113

## 2013-02-17 MED ORDER — POTASSIUM CHLORIDE 10 MEQ/100ML IV SOLN
10.0000 meq | INTRAVENOUS | Status: AC
  Administered 2013-02-17 – 2013-02-18 (×4): 10 meq via INTRAVENOUS
  Filled 2013-02-17 (×4): qty 100

## 2013-02-17 MED ORDER — ONDANSETRON HCL 4 MG/2ML IJ SOLN: 4.0000 mg | Freq: Four times a day (QID) | INTRAMUSCULAR | Status: DC | PRN

## 2013-02-17 MED ORDER — FAMOTIDINE 20 MG PO TABS
20.0000 mg | ORAL_TABLET | Freq: Every day | ORAL | Status: DC | PRN
Start: 2013-02-17 — End: 2013-02-19
  Filled 2013-02-17: qty 1

## 2013-02-17 MED ORDER — SODIUM CHLORIDE 0.9 % IV SOLN
1000.0000 mL | INTRAVENOUS | Status: DC
  Administered 2013-02-17: 1000 mL via INTRAVENOUS

## 2013-02-17 MED ORDER — VANCOMYCIN HCL 10 G IV SOLR
2000.0000 mg | Freq: Once | INTRAVENOUS | Status: AC
  Administered 2013-02-17: 2000 mg via INTRAVENOUS
  Filled 2013-02-17: qty 2000

## 2013-02-17 MED ORDER — ONDANSETRON HCL 4 MG/2ML IJ SOLN
4.0000 mg | Freq: Once | INTRAMUSCULAR | Status: AC
  Administered 2013-02-17: 4 mg via INTRAVENOUS
  Filled 2013-02-17: qty 2

## 2013-02-17 MED ORDER — POTASSIUM CHLORIDE 10 MEQ/100ML IV SOLN
10.0000 meq | Freq: Once | INTRAVENOUS | Status: AC
  Administered 2013-02-17: 10 meq via INTRAVENOUS
  Filled 2013-02-17: qty 100

## 2013-02-17 MED ORDER — INSULIN ASPART 100 UNIT/ML ~~LOC~~ SOLN
0.0000 [IU] | SUBCUTANEOUS | Status: DC
  Administered 2013-02-17: 20 [IU] via SUBCUTANEOUS
  Administered 2013-02-18 (×2): 11 [IU] via SUBCUTANEOUS
  Administered 2013-02-18: 7 [IU] via SUBCUTANEOUS
  Administered 2013-02-18: 20 [IU] via SUBCUTANEOUS
  Administered 2013-02-19: 7 [IU] via SUBCUTANEOUS
  Administered 2013-02-19 (×2): 4 [IU] via SUBCUTANEOUS

## 2013-02-17 MED ORDER — SODIUM CHLORIDE 0.9 % IV SOLN
1500.0000 mg | INTRAVENOUS | Status: DC
  Administered 2013-02-18: 1500 mg via INTRAVENOUS
  Filled 2013-02-17 (×3): qty 1500

## 2013-02-17 MED ORDER — SODIUM CHLORIDE 0.9 % IV SOLN
1000.0000 mL | Freq: Once | INTRAVENOUS | Status: AC
  Administered 2013-02-17: 1000 mL via INTRAVENOUS

## 2013-02-17 MED ORDER — POTASSIUM CHLORIDE CRYS ER 20 MEQ PO TBCR
20.0000 meq | EXTENDED_RELEASE_TABLET | Freq: Three times a day (TID) | ORAL | Status: DC
  Administered 2013-02-17: 20 meq via ORAL
  Filled 2013-02-17 (×4): qty 1

## 2013-02-17 MED ORDER — HEPARIN SODIUM (PORCINE) 5000 UNIT/ML IJ SOLN
5000.0000 [IU] | Freq: Three times a day (TID) | INTRAMUSCULAR | Status: DC
  Administered 2013-02-17 – 2013-02-19 (×5): 5000 [IU] via SUBCUTANEOUS
  Filled 2013-02-17 (×8): qty 1

## 2013-02-17 NOTE — H&P (Signed)
[facility] History and Physical  [name]:[id number] DOB: 05/15/1945    PCP:   Marletta Lor, NP   Chief Complaint: nausea and vomting, weakness.  HPI: [name] is an 67 y.o. female with morbid obesity, aortic stenosis, multifactorial heart failure, and chronic lower extremity venous stasis and recurrent lower extremity cellulitis, admitted in July with cellulitis and found to have Staphylococcus Aureus baccteremia (MSSA), under hospice care, recent increase in her diuresis and addition of Demadex and Zoroxylin, brought to the ER as she has been feeling weak, and having nausea and vomting. Evalaution in the ER showed that she is in hyperosmolar state, with BS of 500's, Bicarb of 44, and K of less than 2.0.  She was found to have a venous pH of 7.5, and Cr of 1.45.  Her UA showed no evidence of infection and her CXR showed cardiomegaly but no infiltrates.  She denied black or bloody stool.  Hospitatlist was asked to admit her for severe hypokalemia, hyperosmolar syndrome, and possible recurrence of her cellulitis.  She is a DNR.  Rewiew of Systems:  Constitutional: Negative for malaise, fever and chills. No significant weight loss or weight gain Eyes: Negative for eye pain, redness and discharge, diplopia, visual changes, or flashes of light. ENMT: Negative for ear pain, hoarseness, nasal congestion, sinus pressure and sore throat. No headaches; tinnitus, drooling, or problem swallowing. Cardiovascular: Negative for chest pain, palpitations, diaphoresis, dyspnea and peripheral edema. ; No orthopnea, PND Respiratory: Negative for cough, hemoptysis, wheezing and stridor. No pleuritic chestpain. Gastrointestinal: Negative for diarrhea, constipation, abdominal pain, melena, blood in stool, hematemesis, jaundice and rectal bleeding.    Genitourinary: Negative for frequency, dysuria, incontinence,flank pain and hematuria; Musculoskeletal: Negative for back pain and neck pain.  Negative for swelling and trauma.;  Skin: . Negative for pruritus, rash, abrasions, bruising and skin lesion.; ulcerations Neuro: Negative for headache, lightheadedness and neck stiffness. Negative for weakness, altered level of consciousness , altered mental status,  burning feet, involuntary movement, seizure and syncope.  Psych: negative for anxiety, depression, insomnia, tearfulness, panic attacks, hallucinations, paranoia, suicidal or homicidal ideation    Past Medical History  Diagnosis Date  . Carotid bruit     0-39% bilateral ICA by dopplers 03/2010  . Aortic stenosis     a. Moderate echo 01/2009, then severe 03/2010 --> last echo 09/2011:  EF 60-65%, grade 2 diastolic dysfunction, severe aortic stenosis, mean gradient 50, mild MR    b. Not an operative candidate due to comorbidities, seen in TAVR clinic 02/2012 but pt deferred further testing. c. low risk myovue 11/2007 breast attenuation EF 63%  . Venous insufficiency   . Arthritis   . Depression   . COPD (chronic obstructive pulmonary disease)   . Hypothyroidism   . Thrombocytopenia   . Hypokalemia   . Edema   . Respiratory failure     Ventilator-dependent respiratory failure secondary to hypercarbia, cardiac arrest, presumed penumonia 12/2008  . Cardiac arrest     12/2010 in setting of respiratory failure with less than 15 minutes of CPR; initial rhythm was asystole  . PVD (peripheral vascular disease)     Eval by Dr. Excell Seltzer 2012: noninvasive immaging suggested significant aortoiliac disease, ABIs in mild range  - med rx given significant comorbidities  . H/O endoscopy     For ?ampullary mass - had EGD 07/2010 with no evidence of ampullary mass, some CBD dilitation   . Chronic pain   . Lumbar stenosis  Admitted 09/2011 - not felt to be a good candidate for surgical intervention.  . CHF (congestive heart failure)   . Morbid obesity 10/27/2011  . Chronic diastolic CHF (congestive heart failure) 10/27/2011  . Lower extremity  weakness 10/26/2011  . Spinal stenosis of lumbar region 10/25/2011  . Diabetes mellitus   . Hypertension   . Bacteremia     a. Adm 08/2012 with MSSA bacteremia, R leg cellulitis. TTE no endocarditis. TEE deferred. 6 weeks abx.  . Yeast infection     a. Abdominal.  . Cirrhosis     a. By CT 09/2012.  Marland Kitchen Hepatosplenomegaly     a. By CT 09/2012.    Past Surgical History  Procedure Laterality Date  . Thyroidectomy    . Laparoscopic cholecystectomy    . Lithotripsy       for nephrolithiasis  . Vaginal hysterectomy       for menorrhagia  at age 17  . Breast cyst excision    . Rotator cuff repair    . Cesarean section    . Laminectomy      Medications:  HOME MEDS: Prior to Admission medications   Medication Sig Start Date End Date Taking? Authorizing Provider  carvedilol (COREG) 6.25 MG tablet Take 1 tablet (6.25 mg total) by mouth 2 (two) times daily with a meal. 10/27/12  Yes Ejiroghene Emokpae, MD  famotidine (PEPCID) 20 MG tablet Take 20 mg by mouth daily as needed for heartburn or indigestion.   Yes Historical Provider, MD  furosemide (LASIX) 10 MG/ML injection Inject into the muscle as needed (severe CHF episode).    Yes Historical Provider, MD  insulin glargine (LANTUS) 100 UNIT/ML injection Inject 30 Units into the skin at bedtime.   Yes Historical Provider, MD  lactulose (CHRONULAC) 10 GM/15ML solution Take 30 g by mouth every other day.   Yes Historical Provider, MD  levothyroxine (SYNTHROID, LEVOTHROID) 137 MCG tablet Take 137 mcg by mouth daily before breakfast.    Yes Historical Provider, MD  metolazone (ZAROXOLYN) 2.5 MG tablet Take 2.5-5 mg by mouth daily. Alternate dose every other day - take 1 tablet (2.5 mg), then next day take 2 tablets (5 mg), then repeat   Yes Historical Provider, MD  nitroGLYCERIN (NITROSTAT) 0.4 MG SL tablet Place 0.4 mg under the tongue every 5 (five) minutes as needed for chest pain.   Yes Historical Provider, MD  oxyCODONE (OXY IR/ROXICODONE) 5 MG  immediate release tablet Take 5 mg by mouth every 6 (six) hours as needed for breakthrough pain.   Yes Historical Provider, MD  OxyCODONE (OXYCONTIN) 20 mg T12A 12 hr tablet Take 20 mg by mouth every 12 (twelve) hours as needed (pain).   Yes Historical Provider, MD  potassium chloride SA (K-DUR,KLOR-CON) 20 MEQ tablet Take 20 mEq by mouth 3 (three) times daily.   Yes Historical Provider, MD  Prochlorperazine Maleate (COMPAZINE PO) Take 1 tablet by mouth as needed (nausea).   Yes Historical Provider, MD  sennosides-docusate sodium (SENOKOT-S) 8.6-50 MG tablet Take 1 tablet by mouth daily.   Yes Historical Provider, MD  Skin Protectants, Misc. (EUCERIN) cream Apply 1 application topically 3 (three) times a week. Monday, Wednesday and Friday   Yes Historical Provider, MD  torsemide (DEMADEX) 20 MG tablet Take 20 mg by mouth 2 (two) times daily.   Yes Historical Provider, MD     Allergies:  Allergies  Allergen Reactions  . Keflex [Cephalexin] Nausea And Vomiting    Blisters in mouth and  back of tongue  . Latex Itching  . Penicillins Hives     tolerates Ancef    Social History:   reports that she has been smoking Cigarettes.  She has a 90 pack-year smoking history. She uses smokeless tobacco. She reports that she does not drink alcohol or use illicit drugs.  Family History: Family History  Problem Relation Age of Onset  . Emphysema Father   . Allergies Sister   . Asthma Sister   . Heart disease Father   . Cancer Mother      Physical Exam: Filed Vitals:   02/17/13 1709 02/17/13 1914 02/17/13 1924 02/17/13 1926  BP: 100/73  92/50 91/21  Pulse: 85 85  82  Temp:  98.4 F (36.9 C)    TempSrc:  Oral    Resp: 14 15  15   SpO2: 96% 95%  92%   Blood pressure 91/21, pulse 82, temperature 98.4 F (36.9 C), temperature source Oral, resp. rate 15, SpO2 92.00%.  GEN:  Pleasant patient lying in the stretcher in no acute distress; cooperative with exam. PSYCH:  alert and oriented x4; does  not appear anxious or depressed; affect is appropriate. HEENT: Mucous membranes pink and anicteric; PERRLA; EOM intact; no cervical lymphadenopathy nor thyromegaly or carotid bruit; no JVD; There were no stridor. Neck is very supple. Breasts:: Not examined CHEST WALL: No tenderness CHEST: Normal respiration, clear to auscultation bilaterally. No wheezing or rales. HEART: Regular rate and rhythm.  There are no murmur, rub, or gallops.   BACK: No kyphosis or scoliosis; no CVA tenderness ABDOMEN: soft and non-tender; no masses, no organomegaly, normal abdominal bowel sounds; no pannus; no intertriginous candida. There is no rebound and no distention. Rectal Exam: Not done EXTREMITIES: No bone or joint deformity; age-appropriate arthropathy of the hands and knees; no edema; no ulcerations.  There is no calf tenderness.  She has some edema, chronic venous stasis changes, and slight erythema of her lower extremities. Genitalia: not examined PULSES: 2+ and symmetric SKIN: Normal hydration no rash or ulceration CNS: Cranial nerves 2-12 grossly intact no focal lateralizing neurologic deficit.  Speech is fluent; uvula elevated with phonation, facial symmetry and tongue midline. DTR are normal bilaterally, cerebella exam is intact, barbinski is negative and strengths are equaled bilaterally.  No sensory loss.   Labs on Admission:  Basic Metabolic Panel:  Recent Labs Lab 02/17/13 1734  NA 130*  K <2.0*  CL 77*  CO2 44*  GLUCOSE 474*  BUN 32*  CREATININE 1.45*  CALCIUM 8.7   Liver Function Tests:  Recent Labs Lab 02/17/13 1734  AST 23  ALT 14  ALKPHOS 148*  BILITOT 1.3*  PROT 7.1  ALBUMIN 2.9*   No results found for this basename: LIPASE, AMYLASE,  in the last 168 hours No results found for this basename: AMMONIA,  in the last 168 hours CBC:  Recent Labs Lab 02/17/13 1734  WBC 7.2  NEUTROABS 5.5  HGB 8.9*  HCT 28.3*  MCV 79.7  PLT 123*   Cardiac Enzymes: No results found  for this basename: CKTOTAL, CKMB, CKMBINDEX, TROPONINI,  in the last 168 hours  CBG:  Recent Labs Lab 02/17/13 1655  GLUCAP 567*     Radiological Exams on Admission: Dg Chest Port 1 View  02/17/2013   CLINICAL DATA:  Hyperglycemia.  EXAM: PORTABLE CHEST - 1 VIEW  COMPARISON:  Portable examinations 10/19/2012 through 10/22/2012. Two-view chest x-ray 10/13/2012.  FINDINGS: Suboptimal inspiration due to body habitus accounts for crowded bronchovascular  markings diffusely and bibasilar atelectasis, and also accentuates the cardiac silhouette. Taking this into account, cardiac silhouette moderately enlarged but stable. Pulmonary venous hypertension without overt edema currently. Lungs otherwise clear. No visible pleural effusions.  IMPRESSION: 1. Suboptimal inspiration accounts for bibasilar atelectasis. No acute cardiopulmonary disease otherwise. 2. Stable moderate cardiomegaly. Pulmonary venous hypertension without overt edema currently.   Electronically Signed   By: Hulan Saas M.D.   On: 02/17/2013 17:18    Assessment/Plan Present on Admission:  . Hyperosmolarity syndrome . Fluid volume depletion . CONGESTIVE HEART FAILURE UNSPECIFIED . Chronic diastolic CHF (congestive heart failure) . AORTIC STENOSIS . HYPOTHYROIDISM . HYPOKALEMIA . Morbid obesity . Metabolic alkalosis . DNR no code (do not resuscitate) . Cellulitis . Hyperosmolar syndrome  PLAN:  This patient has hyperosmolar and is clearly intravascular volume depleted.  She does have hx of CHF, and chronic edema, so IVF has to be given cautiously.  She also has severe hypokalemia and will need aggressive supplementation.  For her alkalosis, I suspect it is from volume contraction.  She also has some cellulitis of her lower extremities, and in the past was treated with VAN (and Cefepime).  I will admit her to the SDU, will give IVF at 100cc per hour.  Check I/O, and watch for volume overload.  She will be given KCL  supplementation, and check for Mag level.  She will be given IV Zenaida Niece alone for her lower extremity cellulitis.  For her hypothyroidism, will continue supplement and check TSH.  Her BS will be controlled with SSI.  She is a DNR and will continue with this.  She is stable, though has high morbidity, and her family is aware of her condition.  Thank you for asking me to participate in her care.   Other plans as per orders.  Code Status: FULL Unk Lightning, MD. [facility] Pager [phone] 7pm to 7am.  02/17/2013, 7:54 PM

## 2013-02-17 NOTE — ED Notes (Signed)
Assumed pt care.  IV to right AC functional however pt unable to keep arm straight so fluids not flowing in well.  Pt appears alert, then will appear to be unaware of conversation around her; low comprehension noted with family members as to balance fluid needed for pt.  Much teaching necessary.

## 2013-02-17 NOTE — Consult Note (Signed)
ANTIBIOTIC CONSULT NOTE - INITIAL  Pharmacy Consult for Vancomycin Indication: cellulitis  Allergies  Allergen Reactions  . Keflex [Cephalexin] Nausea And Vomiting    Blisters in mouth and back of tongue  . Latex Itching  . Penicillins Hives     tolerates Ancef    Patient Measurements: Height: 4' 11.84" (152 cm) Weight: 230 lb 2.6 oz (104.4 kg) IBW/kg (Calculated) : 45.14  Vital Signs: Temp: 98.4 F (36.9 C) (12/25 1914) Temp src: Oral (12/25 1914) BP: 102/40 mmHg (12/25 2000) Pulse Rate: 101 (12/25 2000) Intake/Output from previous day:   Intake/Output from this shift:    Labs:  Recent Labs  02/17/13 1734  WBC 7.2  HGB 8.9*  PLT 123*  CREATININE 1.45*   Estimated Creatinine Clearance: 41.5 ml/min (by C-G formula based on Cr of 1.45).  Microbiology: No results found for this or any previous visit (from the past 720 hour(s)).  Medical History: Past Medical History  Diagnosis Date  . Carotid bruit     0-39% bilateral ICA by dopplers 03/2010  . Aortic stenosis     a. Moderate echo 01/2009, then severe 03/2010 --> last echo 09/2011:  EF 60-65%, grade 2 diastolic dysfunction, severe aortic stenosis, mean gradient 50, mild MR    b. Not an operative candidate due to comorbidities, seen in TAVR clinic 02/2012 but pt deferred further testing. c. low risk myovue 11/2007 breast attenuation EF 63%  . Venous insufficiency   . Arthritis   . Depression   . COPD (chronic obstructive pulmonary disease)   . Hypothyroidism   . Thrombocytopenia   . Hypokalemia   . Edema   . Respiratory failure     Ventilator-dependent respiratory failure secondary to hypercarbia, cardiac arrest, presumed penumonia 12/2008  . Cardiac arrest     12/2010 in setting of respiratory failure with less than 15 minutes of CPR; initial rhythm was asystole  . PVD (peripheral vascular disease)     Eval by Dr. Excell Seltzer 2012: noninvasive immaging suggested significant aortoiliac disease, ABIs in mild range   - med rx given significant comorbidities  . H/O endoscopy     For ?ampullary mass - had EGD 07/2010 with no evidence of ampullary mass, some CBD dilitation   . Chronic pain   . Lumbar stenosis     Admitted 09/2011 - not felt to be a good candidate for surgical intervention.  . CHF (congestive heart failure)   . Morbid obesity 10/27/2011  . Chronic diastolic CHF (congestive heart failure) 10/27/2011  . Lower extremity weakness 10/26/2011  . Spinal stenosis of lumbar region 10/25/2011  . Diabetes mellitus   . Hypertension   . Bacteremia     a. Adm 08/2012 with MSSA bacteremia, R leg cellulitis. TTE no endocarditis. TEE deferred. 6 weeks abx.  . Yeast infection     a. Abdominal.  . Cirrhosis     a. By CT 09/2012.  Marland Kitchen Hepatosplenomegaly     a. By CT 09/2012.   Assessment: 67yof with hx recurrent LE cellulitis presents to the ED with N/V and weakness. Cellulitis again noted on her LE. She will begin vancomycin. Will use the obesity nomogram for dosing given weight of 104kg. She also has some renal insufficiency with sCr 1.45 and CrCl 23ml/min.  Goal of Therapy:  Vancomycin trough level 10-15 mcg/ml  Plan:  1) Vancomycin 2g IV x 1 then 1500mg  IV q24 2) Follow renal function, cultures, LOT, trough at steady state  Fredrik Rigger 02/17/2013,9:52 PM

## 2013-02-17 NOTE — ED Notes (Signed)
MD at bedside. 

## 2013-02-17 NOTE — ED Notes (Signed)
Family at bedside. 

## 2013-02-17 NOTE — ED Notes (Signed)
Pt from home and is a hospice patient and a full DNR. Hospice RN called [facility] due to pt's increased lethargy and decreased mental status. Pt's blood sugar too great to register per EMS. Pt alert and oriented x 3 at this time. Pt recently diagnosed with bilateral cellulitis to lower extremities. Husband stopped giving pt abx due to pt not liking the way medicine made her feel.

## 2013-02-17 NOTE — ED Notes (Addendum)
Pt oxygen level was low, placed pt on 2L of O2. Pt oxygen levels are up to 96%

## 2013-02-17 NOTE — ED Provider Notes (Signed)
CSN: [id number]     Arrival date & time 02/17/13  1630 History   First MD Initiated Contact with Patient 02/17/13 1642     Chief Complaint  Patient presents with  . Hyperglycemia   (Consider location/radiation/quality/duration/timing/severity/associated sxs/prior Treatment) Patient is a 67 y.o. female presenting with hyperglycemia. The history is provided by the spouse and a relative. The history is limited by the condition of the patient (Altered mental status).  Hyperglycemia She is on hospice for congestive heart failure and also has diabetes. For the last 2 weeks, her sugars have been trending higher and for the last 3 days, and have been reading higher than 500. She has also been getting more and more lethargic and this has been much worse over the last 3 days. There's been nausea and dry heaves and intermittent vomiting. She's not had any diarrhea. She's had chills but no fever or sweats. She has had urinary frequency and her husband states that she has lost about 40 pounds over the last 2 weeks. Also, she had recently been given a prescription for cephalexin but stopped it after 5 days because she developed ulcers in her mouth. She has been treated for cellulitis in her legs which is chronic. Also, of note, she had a blood drawn 2 days ago and family got a call today saying that her sugar was very high and that she would need to come to the hospital.  Past Medical History  Diagnosis Date  . Carotid bruit     0-39% bilateral ICA by dopplers 03/2010  . Aortic stenosis     a. Moderate echo 01/2009, then severe 03/2010 --> last echo 09/2011:  EF 60-65%, grade 2 diastolic dysfunction, severe aortic stenosis, mean gradient 50, mild MR    b. Not an operative candidate due to comorbidities, seen in TAVR clinic 02/2012 but pt deferred further testing. c. low risk myovue 11/2007 breast attenuation EF 63%  . Venous insufficiency   . Arthritis   . Depression   . COPD (chronic obstructive pulmonary  disease)   . Hypothyroidism   . Thrombocytopenia   . Hypokalemia   . Edema   . Respiratory failure     Ventilator-dependent respiratory failure secondary to hypercarbia, cardiac arrest, presumed penumonia 12/2008  . Cardiac arrest     12/2010 in setting of respiratory failure with less than 15 minutes of CPR; initial rhythm was asystole  . PVD (peripheral vascular disease)     Eval by Dr. Excell Seltzer 2012: noninvasive immaging suggested significant aortoiliac disease, ABIs in mild range  - med rx given significant comorbidities  . H/O endoscopy     For ?ampullary mass - had EGD 07/2010 with no evidence of ampullary mass, some CBD dilitation   . Chronic pain   . Lumbar stenosis     Admitted 09/2011 - not felt to be a good candidate for surgical intervention.  . CHF (congestive heart failure)   . Morbid obesity 10/27/2011  . Chronic diastolic CHF (congestive heart failure) 10/27/2011  . Lower extremity weakness 10/26/2011  . Spinal stenosis of lumbar region 10/25/2011  . Diabetes mellitus   . Hypertension   . Bacteremia     a. Adm 08/2012 with MSSA bacteremia, R leg cellulitis. TTE no endocarditis. TEE deferred. 6 weeks abx.  . Yeast infection     a. Abdominal.  . Cirrhosis     a. By CT 09/2012.  Marland Kitchen Hepatosplenomegaly     a. By CT 09/2012.   Past Surgical History  Procedure Laterality Date  . Thyroidectomy    . Laparoscopic cholecystectomy    . Lithotripsy       for nephrolithiasis  . Vaginal hysterectomy       for menorrhagia  at age 77  . Breast cyst excision    . Rotator cuff repair    . Cesarean section    . Laminectomy     Family History  Problem Relation Age of Onset  . Emphysema Father   . Allergies Sister   . Asthma Sister   . Heart disease Father   . Cancer Mother    History  Substance Use Topics  . Smoking status: Current Every Day Smoker -- 2.00 packs/day for 45 years    Types: Cigarettes  . Smokeless tobacco: Current User     Comment: PT USES [employer]  .  Alcohol Use: No   OB History   Grav Para Term Preterm Abortions TAB SAB Ect Mult Living                 Review of Systems  Unable to perform ROS: Mental status change    Allergies  Keflex; Latex; and Penicillins  Home Medications   Current Outpatient Rx  Name  Route  Sig  Dispense  Refill  . carvedilol (COREG) 6.25 MG tablet   Oral   Take 1 tablet (6.25 mg total) by mouth 2 (two) times daily with a meal.   30 tablet   2   . lactulose (CHRONULAC) 10 GM/15ML solution   Oral   Take 30 g by mouth every other day.         . levothyroxine (SYNTHROID, LEVOTHROID) 137 MCG tablet   Oral   Take 137 mcg by mouth daily before breakfast.          . metolazone (ZAROXOLYN) 2.5 MG tablet   Oral   Take 2.5-5 mg by mouth daily. Alternate dose every other day - take 1 tablet (2.5 mg), then next day take 2 tablets (5 mg), then repeat         . oxyCODONE (OXY IR/ROXICODONE) 5 MG immediate release tablet   Oral   Take 5 mg by mouth every 6 (six) hours as needed for breakthrough pain.         . OxyCODONE (OXYCONTIN) 20 mg T12A 12 hr tablet   Oral   Take 20 mg by mouth every 12 (twelve) hours as needed (pain).         . potassium chloride SA (K-DUR,KLOR-CON) 20 MEQ tablet   Oral   Take 20 mEq by mouth 3 (three) times daily.         . sennosides-docusate sodium (SENOKOT-S) 8.6-50 MG tablet   Oral   Take 1 tablet by mouth daily.         Marland Kitchen torsemide (DEMADEX) 20 MG tablet   Oral   Take 20 mg by mouth 2 (two) times daily.         Marland Kitchen ALPRAZolam (XANAX) 1 MG tablet   Oral   Take 1 mg by mouth 3 (three) times daily as needed for anxiety.          . diclofenac (FLECTOR) 1.3 % PTCH   Transdermal   Place 1 patch onto the skin 2 (two) times daily.   60 patch   1   . docusate sodium (COLACE) 50 MG capsule   Oral   Take 1 capsule (50 mg total) by mouth 2 (two) times daily.  10 capsule   0   . EXPIRED: famotidine (PEPCID) 20 MG tablet   Oral   Take 1 tablet (20  mg total) by mouth daily. For indigestion   30 tablet   1   . glimepiride (AMARYL) 1 MG tablet               . hydrocerin (EUCERIN) CREA   Topical   Apply 1 application topically daily.   228 g   3   . LANTUS 100 UNIT/ML injection               . metFORMIN (GLUCOPHAGE) 1000 MG tablet   Oral   Take 1,000 mg by mouth 2 (two) times daily with a meal.         . NITROSTAT 0.4 MG SL tablet               . oxyCODONE (OXY IR/ROXICODONE) 5 MG immediate release tablet   Oral   Take 1 tablet (5 mg total) by mouth every 6 (six) hours.   30 tablet   0   . potassium chloride (K-DUR,KLOR-CON) 10 MEQ tablet   Oral   Take 2 tablets (20 mEq total) by mouth 2 (two) times daily.   120 tablet   2   . sulfamethoxazole-trimethoprim (BACTRIM,SEPTRA) 400-80 MG per tablet               . torsemide (DEMADEX) 20 MG tablet   Oral   Take 1 tablet (20 mg total) by mouth daily.   30 tablet   2    BP 97/23  Pulse 88  Temp(Src) 98.8 F (37.1 C) (Oral)  SpO2 93% Physical Exam  Nursing note and vitals reviewed.  67 year old female, resting comfortably and in no acute distress. Vital signs are normal. Oxygen saturation is 93%, which is normal. Head is normocephalic and atraumatic. PERRLA, EOMI. Oropharynx is clear. Mucous membranes are very dry. Neck is nontender and supple without adenopathy or JVD. Back is nontender and there is no CVA tenderness. Lungs are clear without rales, wheezes, or rhonchi. Chest is nontender. Heart has regular rate and rhythm with 3/6 harsh systolic murmur with radiation to the neck. Abdomen is soft, flat, nontender without masses or hepatosplenomegaly and peristalsis is normoactive. Extremities have 1+ edema. There is mild erythema and warmth of the left lower leg. There is more intense erythema with ichthyosis of the skin of the right lower leg. Skin is warm and dry without rash. Neurologic: She is awake although slightly lethargic. She is  oriented to person and knows she is in [place] but does not know she is in the hospital. She knows it is Christmas but does not know the month or year. Cranial nerves are intact, there are no motor or sensory deficits.  ED Course  Procedures (including critical care time) Labs Review Results for orders placed during the hospital encounter of 02/17/13  GLUCOSE, CAPILLARY      Result Value Range   Glucose-Capillary 567 (*) 70 - 99 mg/dL  CBC WITH DIFFERENTIAL      Result Value Range   WBC 7.2  4.0 - 10.5 K/uL   RBC 3.55 (*) 3.87 - 5.11 MIL/uL   Hemoglobin 8.9 (*) 12.0 - 15.0 g/dL   HCT 30.8 (*) 65.7 - 84.6 %   MCV 79.7  78.0 - 100.0 fL   MCH 25.1 (*) 26.0 - 34.0 pg   MCHC 31.4  30.0 - 36.0 g/dL   RDW 96.2  11.5 - 15.5 %   Platelets 123 (*) 150 - 400 K/uL   Neutrophils Relative % 76  43 - 77 %   Neutro Abs 5.5  1.7 - 7.7 K/uL   Lymphocytes Relative 17  12 - 46 %   Lymphs Abs 1.2  0.7 - 4.0 K/uL   Monocytes Relative 6  3 - 12 %   Monocytes Absolute 0.5  0.1 - 1.0 K/uL   Eosinophils Relative 1  0 - 5 %   Eosinophils Absolute 0.1  0.0 - 0.7 K/uL   Basophils Relative 0  0 - 1 %   Basophils Absolute 0.0  0.0 - 0.1 K/uL  COMPREHENSIVE METABOLIC PANEL      Result Value Range   Sodium 130 (*) 135 - 145 mEq/L   Potassium <2.0 (*) 3.5 - 5.1 mEq/L   Chloride 77 (*) 96 - 112 mEq/L   CO2 44 (*) 19 - 32 mEq/L   Glucose, Bld 474 (*) 70 - 99 mg/dL   BUN 32 (*) 6 - 23 mg/dL   Creatinine, Ser 4.09 (*) 0.50 - 1.10 mg/dL   Calcium 8.7  8.4 - 81.1 mg/dL   Total Protein 7.1  6.0 - 8.3 g/dL   Albumin 2.9 (*) 3.5 - 5.2 g/dL   AST 23  0 - 37 U/L   ALT 14  0 - 35 U/L   Alkaline Phosphatase 148 (*) 39 - 117 U/L   Total Bilirubin 1.3 (*) 0.3 - 1.2 mg/dL   GFR calc non Af Amer 37 (*) >90 mL/min   GFR calc Af Amer 42 (*) >90 mL/min  URINALYSIS, ROUTINE W REFLEX MICROSCOPIC      Result Value Range   Color, Urine YELLOW  YELLOW   APPearance CLEAR  CLEAR   Specific Gravity, Urine 1.012  1.005 -  1.030   pH 7.5  5.0 - 8.0   Glucose, UA 500 (*) NEGATIVE mg/dL   Hgb urine dipstick NEGATIVE  NEGATIVE   Bilirubin Urine NEGATIVE  NEGATIVE   Ketones, ur NEGATIVE  NEGATIVE mg/dL   Protein, ur NEGATIVE  NEGATIVE mg/dL   Urobilinogen, UA 1.0  0.0 - 1.0 mg/dL   Nitrite NEGATIVE  NEGATIVE   Leukocytes, UA NEGATIVE  NEGATIVE  POCT I-STAT 3, BLOOD GAS (G3+)      Result Value Range   pH, Arterial 7.556 (*) 7.350 - 7.450   pCO2 arterial 54.2 (*) 35.0 - 45.0 mmHg   pO2, Arterial 57.0 (*) 80.0 - 100.0 mmHg   Bicarbonate 48.1 (*) 20.0 - 24.0 mEq/L   TCO2 50  0 - 100 mmol/L   O2 Saturation 92.0     Acid-Base Excess 23.0 (*) 0.0 - 2.0 mmol/L   Collection site RADIAL, ALLEN'S TEST ACCEPTABLE     Drawn by RT     Sample type ARTERIAL    CG4 I-STAT (LACTIC ACID)      Result Value Range   Lactic Acid, Venous 1.82  0.5 - 2.2 mmol/L   Imaging Review Dg Chest Port 1 View  02/17/2013   CLINICAL DATA:  Hyperglycemia.  EXAM: PORTABLE CHEST - 1 VIEW  COMPARISON:  Portable examinations 10/19/2012 through 10/22/2012. Two-view chest x-ray 10/13/2012.  FINDINGS: Suboptimal inspiration due to body habitus accounts for crowded bronchovascular markings diffusely and bibasilar atelectasis, and also accentuates the cardiac silhouette. Taking this into account, cardiac silhouette moderately enlarged but stable. Pulmonary venous hypertension without overt edema currently. Lungs otherwise clear. No visible pleural effusions.  IMPRESSION: 1. Suboptimal  inspiration accounts for bibasilar atelectasis. No acute cardiopulmonary disease otherwise. 2. Stable moderate cardiomegaly. Pulmonary venous hypertension without overt edema currently.   Electronically Signed   By: Hulan Saas M.D.   On: 02/17/2013 17:18    EKG Interpretation    Date/Time:  Thursday February 17 2013 16:49:56 EST Ventricular Rate:  90 PR Interval:  184 QRS Duration: 92 QT Interval:  479 QTC Calculation: 586 R Axis:   6 Text Interpretation:   Sinus rhythm Atrial premature complexes Consider left atrial enlargement Left ventricular hypertrophy Prolonged QT interval When compared with ECG of 10/23/2012, QT has lengthened Confirmed by Preston Fleeting  MD, Terique Kawabata ([id number]) on 02/17/2013 5:11:34 PM            MDM   1. Lethargy   2. Hyperglycemia   3. Hypokalemia   4. Metabolic alkalosis   5. Chronic respiratory acidosis   6. Anemia   7. Renal insufficiency    Hyperglycemia with evidence of dehydration. With altered mentation, need to consider possibility of hyperosmolar nonketotic coma as well his ketoacidosis. Patient is hospice but family does wish her to be hospitalized if necessary. Old records are reviewed and she had been admitted to the hospital 4 months ago for congestive heart failure and was discharged under hospice care. She also had been on IV cefazolin for her cellulitis. I was not able to find any recent laboratory reports.  Laboratory results are remarkable for a drop in hemoglobin to 8.9 from 13.1. Stool is been sent for Hemoccult testing with results not back yet. Metabolic panel is markedly abnormal with hyponatremia (sodium 130), hypokalemia (potassium less than 2.0), and metabolic alkalosis (CO2 44). Although vibratory abnormalities are new. Also, creatinine has increased to 1.45 from baseline of 0.9. BUN is unchanged from baseline. Arterial blood gases showed elevated PCO2 of 54 which is consistent with prior blood gases, but marked elevation of bicarbonate consistent with result seen on metabolic panel. Blood sugar on metabolic panel was only 474. She is started on intravenous potassium. She will need moderately aggressive hydration and potassium replacement. Case is discussed with Dr. Conley Rolls [facility] who agrees to admit the patient.  Dione Booze, MD 02/17/13 [phone]

## 2013-02-17 NOTE — ED Notes (Signed)
cbg was 567.

## 2013-02-18 DIAGNOSIS — L0291 Cutaneous abscess, unspecified: Secondary | ICD-10-CM

## 2013-02-18 DIAGNOSIS — I359 Nonrheumatic aortic valve disorder, unspecified: Secondary | ICD-10-CM

## 2013-02-18 DIAGNOSIS — I959 Hypotension, unspecified: Secondary | ICD-10-CM

## 2013-02-18 DIAGNOSIS — I5032 Chronic diastolic (congestive) heart failure: Secondary | ICD-10-CM

## 2013-02-18 DIAGNOSIS — I509 Heart failure, unspecified: Secondary | ICD-10-CM

## 2013-02-18 DIAGNOSIS — R5381 Other malaise: Secondary | ICD-10-CM

## 2013-02-18 DIAGNOSIS — J9610 Chronic respiratory failure, unspecified whether with hypoxia or hypercapnia: Secondary | ICD-10-CM

## 2013-02-18 LAB — COMPREHENSIVE METABOLIC PANEL
ALT: 13 U/L (ref 0–35)
AST: 27 U/L (ref 0–37)
Albumin: 2.8 g/dL — ABNORMAL LOW (ref 3.5–5.2)
Alkaline Phosphatase: 130 U/L — ABNORMAL HIGH (ref 39–117)
BUN: 28 mg/dL — ABNORMAL HIGH (ref 6–23)
Chloride: 89 mEq/L — ABNORMAL LOW (ref 96–112)
GFR calc non Af Amer: 42 mL/min — ABNORMAL LOW (ref >90)
Glucose, Bld: 105 mg/dL — ABNORMAL HIGH (ref 70–99)
Potassium: 2.3 mEq/L — CL (ref 3.5–5.1)
Sodium: 136 mEq/L (ref 135–145)
Total Protein: 6.7 g/dL (ref 6.0–8.3)

## 2013-02-18 LAB — BASIC METABOLIC PANEL
CO2: 36 mEq/L — ABNORMAL HIGH (ref 19–32)
Calcium: 7.7 mg/dL — ABNORMAL LOW (ref 8.4–10.5)
GFR calc non Af Amer: 47 mL/min — ABNORMAL LOW (ref >90)
Potassium: 2.3 mEq/L — CL (ref 3.5–5.1)
Sodium: 137 mEq/L (ref 135–145)

## 2013-02-18 LAB — GLUCOSE, CAPILLARY
Glucose-Capillary: 116 mg/dL — ABNORMAL HIGH (ref 70–99)
Glucose-Capillary: 112 mg/dL — ABNORMAL HIGH (ref 70–99)
Glucose-Capillary: 251 mg/dL — ABNORMAL HIGH (ref 70–99)
Glucose-Capillary: 280 mg/dL — ABNORMAL HIGH (ref 70–99)

## 2013-02-18 LAB — TSH: TSH: 0.66 u[IU]/mL (ref 0.350–4.500)

## 2013-02-18 LAB — CBC
HCT: 28.4 % — ABNORMAL LOW (ref 36.0–46.0)
MCH: 25.4 pg — ABNORMAL LOW (ref 26.0–34.0)
MCHC: 31.7 g/dL (ref 30.0–36.0)
MCV: 80 fL (ref 78.0–100.0)
RDW: 15.8 % — ABNORMAL HIGH (ref 11.5–15.5)
WBC: 9.9 10*3/uL (ref 4.0–10.5)

## 2013-02-18 MED ORDER — SODIUM CHLORIDE 0.9 % IV SOLN
1000.0000 mL | Freq: Once | INTRAVENOUS | Status: AC
  Administered 2013-02-18: 1000 mL via INTRAVENOUS

## 2013-02-18 MED ORDER — POTASSIUM CHLORIDE 10 MEQ/100ML IV SOLN
10.0000 meq | INTRAVENOUS | Status: AC
  Administered 2013-02-18 (×4): 10 meq via INTRAVENOUS
  Filled 2013-02-18 (×4): qty 100

## 2013-02-18 MED ORDER — POTASSIUM CHLORIDE CRYS ER 20 MEQ PO TBCR
40.0000 meq | EXTENDED_RELEASE_TABLET | Freq: Three times a day (TID) | ORAL | Status: DC
  Administered 2013-02-18 – 2013-02-19 (×4): 40 meq via ORAL
  Filled 2013-02-18 (×6): qty 2

## 2013-02-18 NOTE — Progress Notes (Signed)
Utilization review completed. Luci Bellucci, RN, BSN. 

## 2013-02-18 NOTE — Progress Notes (Signed)
Inpatient Room 2C16, [facility] Nurse visit  Pt admitted to [facility] 02/17/13 with Dx volume depletion, hypokalemia.  Pt's spouse reported that he took her to the ER because she was so weak and having N&V.  Pt reported to have recently had her fluid pills adjusted.    Pt lying in bed with spouse at her side.  Pt's eyes opening and closing.  She appeared to be dozing in and out.  PCG reported that the pt is doing fair.  He verbalized belief that she would have died had he not brought her to the hospital yesterday.  Pt was able to tell writer that she does have pain in various places.  She rated it as moderate but added that this is her baseline.    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  This is a hospice related admission.   Please contact [facility] at [phone] for any hospice related concerns.  Elijah Birk RN, [facility] RN, [facility] [phone]

## 2013-02-18 NOTE — Progress Notes (Signed)
Pt alert and oriented. Husband at bedside.  Provided support to pt and husband.  Plan is for pt to return home once she is ready for discharge.  Hospice will continue to follow.  Wynonia Hazard, LCSW

## 2013-02-18 NOTE — Progress Notes (Addendum)
[facility] PROGRESS NOTE  [name] ZOX:[id number] DOB: 05/01/1945 DOA: 02/17/2013 PCP: Marletta Lor, NP  Assessment/Plan: 67 y.o. female with morbid obesity, aortic stenosis, multifactorial heart failure, COPD, hypothyroidism, DM, chronic lower extremity venous stasis and recurrent lower extremity cellulitis, admitted in July with cellulitis and found to have Staphylococcus Aureus baccteremia (MSSA), under hospice care, recent increase in her diuresis and addition of Demadex and Zoroxylin, brought to the ER as she has been feeling weak, and having nausea and vomting. Evalaution in the ER showed that she is in hyperosmolar state, with BS of 500's, Bicarb of 44, and K of less than 2.0.   1. Hypokalemia, with contraction alkalosis due to overdiuresis  -cont IV/PO kcl, recheck potassium, replace as need; hold diuretics;    2. Mild LE cellulitis chronic skin dermitis, venous stasis;  -cont atx, likely d/c/change to PO doxy in AM  3. Acute on chronic respiratory failure likely due to combination of COPD+ CHF/AS -cont oxygen, prn inhalers, resume diuretics in AM  4. Chronic CHF/AS ; echo (09/2012): LVEF 70%, sever AS, Mean gradient: 50mm  -cont gentle IVF, close monitor, resume diuretics as needed   5. Hypotensio likely volume depleted; IVF; close monitor due to chronic CHF  6. DM cont insulin regimen; monitor   Code Status: DNR Family Communication: d/w husband  at the bedside (indicate person spoken with, relationship, and if by phone, the number) Disposition Plan: likely to cont hospice    Consultants:  None   Procedures:  None   Antibiotics:  vanc 12/25<<< (indicate start date, and stop date if known)  HPI/Subjective: letargic  Objective: Filed Vitals:   02/18/13 0725  BP: 91/21  Pulse: 83  Temp: 97.9 F (36.6 C)  Resp: 16    Intake/Output Summary (Last 24 hours) at 02/18/13 0810 Last data filed at 02/18/13 0400  Gross per 24 hour  Intake 214.67 ml   Output      2 ml  Net 212.67 ml   Filed Weights   02/17/13 2100  Weight: 104.4 kg (230 lb 2.6 oz)    Exam:   General:  letargic   Cardiovascular: s1,s2 syst murmur   Respiratory: CTA BL  Abdomen: soft, nt, nd   Musculoskeletal: chronic LE edema   Data Reviewed: Basic Metabolic Panel:  Recent Labs Lab 02/17/13 1734 02/18/13 0600  NA 130* 136  K <2.0* 2.3*  CL 77* 89*  CO2 44* 40*  GLUCOSE 474* 105*  BUN 32* 28*  CREATININE 1.45* 1.29*  CALCIUM 8.7 8.2*  MG 2.3  --    Liver Function Tests:  Recent Labs Lab 02/17/13 1734 02/18/13 0600  AST 23 27  ALT 14 13  ALKPHOS 148* 130*  BILITOT 1.3* 1.0  PROT 7.1 6.7  ALBUMIN 2.9* 2.8*   No results found for this basename: LIPASE, AMYLASE,  in the last 168 hours No results found for this basename: AMMONIA,  in the last 168 hours CBC:  Recent Labs Lab 02/17/13 1734 02/18/13 0600  WBC 7.2 9.9  NEUTROABS 5.5  --   HGB 8.9* 9.0*  HCT 28.3* 28.4*  MCV 79.7 80.0  PLT 123* 132*   Cardiac Enzymes: No results found for this basename: CKTOTAL, CKMB, CKMBINDEX, TROPONINI,  in the last 168 hours BNP (last 3 results)  Recent Labs  10/13/12 1419  PROBNP 694.1*   CBG:  Recent Labs Lab 02/17/13 1655 02/17/13 2006 02/17/13 2137 02/17/13 2336 02/18/13 0455  GLUCAP 567* 440* 451* 435* 116*  Recent Results (from the past 240 hour(s))  MRSA PCR SCREENING     Status: None   Collection Time    02/17/13  9:46 PM      Result Value Range Status   MRSA by PCR NEGATIVE  NEGATIVE Final   Comment:            The GeneXpert MRSA Assay (FDA     approved for NASAL specimens     only), is one component of a     comprehensive MRSA colonization     surveillance program. It is not     intended to diagnose MRSA     infection nor to guide or     monitor treatment for     MRSA infections.     Studies: Dg Chest Port 1 View  02/17/2013   CLINICAL DATA:  Hyperglycemia.  EXAM: PORTABLE CHEST - 1 VIEW  COMPARISON:   Portable examinations 10/19/2012 through 10/22/2012. Two-view chest x-ray 10/13/2012.  FINDINGS: Suboptimal inspiration due to body habitus accounts for crowded bronchovascular markings diffusely and bibasilar atelectasis, and also accentuates the cardiac silhouette. Taking this into account, cardiac silhouette moderately enlarged but stable. Pulmonary venous hypertension without overt edema currently. Lungs otherwise clear. No visible pleural effusions.  IMPRESSION: 1. Suboptimal inspiration accounts for bibasilar atelectasis. No acute cardiopulmonary disease otherwise. 2. Stable moderate cardiomegaly. Pulmonary venous hypertension without overt edema currently.   Electronically Signed   By: Hulan Saas M.D.   On: 02/17/2013 17:18    Scheduled Meds: . heparin  5,000 Units Subcutaneous Q8H  . hydrocerin  1 application Topical 3 times weekly  . insulin aspart  0-20 Units Subcutaneous Q4H  . insulin glargine  30 Units Subcutaneous QHS  . levothyroxine  137 mcg Oral QAC breakfast  . potassium chloride SA  40 mEq Oral TID  . sodium chloride  3 mL Intravenous Q12H  . vancomycin  1,500 mg Intravenous Q24H   Continuous Infusions: . 0.9 % NaCl with KCl 40 mEq / L 100 mL/hr at 02/17/13 2155    Active Problems:   HYPOTHYROIDISM   HYPOKALEMIA   AORTIC STENOSIS   CONGESTIVE HEART FAILURE UNSPECIFIED   Cellulitis   Chronic diastolic CHF (congestive heart failure)   Morbid obesity   Metabolic alkalosis   Hyperosmolarity syndrome   Fluid volume depletion   DNR no code (do not resuscitate)   Hyperosmolar syndrome    Time spent: >35 minutes     Esperanza Sheets  [facility] Pager [phone]. If 7PM-7AM, please contact night-coverage at [email or web address], password [facility] 02/18/2013, 8:10 AM  LOS: 1 day

## 2013-02-18 NOTE — Progress Notes (Addendum)
Dr York Spaniel came to the unit after being paged regarding panic lab value.  He stated that the plan will be to keep patient on 0.9% NaCl with 40K IV, give runs of IV K, and maintain PO administration of K three times daily.

## 2013-02-18 NOTE — Progress Notes (Signed)
CRITICAL VALUE ALERT  Critical value received:  K  2.3  Date of notification:  02/18/2013  Time of notification:  1155  Critical value read back:yes  Nurse who received alert:  Nicole Cella, RN  MD notified (1st page):  Dr York Spaniel  Time of first page:  1214  MD notified (2nd page):  Time of second page:  Responding MD:  Dr York Spaniel  Time MD responded:  1224

## 2013-02-19 DIAGNOSIS — E870 Hyperosmolality and hypernatremia: Secondary | ICD-10-CM

## 2013-02-19 DIAGNOSIS — E876 Hypokalemia: Secondary | ICD-10-CM

## 2013-02-19 DIAGNOSIS — E872 Acidosis: Secondary | ICD-10-CM

## 2013-02-19 LAB — GLUCOSE, CAPILLARY
Glucose-Capillary: 160 mg/dL — ABNORMAL HIGH (ref 70–99)
Glucose-Capillary: 188 mg/dL — ABNORMAL HIGH (ref 70–99)
Glucose-Capillary: 243 mg/dL — ABNORMAL HIGH (ref 70–99)
Glucose-Capillary: 94 mg/dL (ref 70–99)

## 2013-02-19 LAB — BASIC METABOLIC PANEL
BUN: 16 mg/dL (ref 6–23)
CO2: 28 mEq/L (ref 19–32)
Chloride: 99 mEq/L (ref 96–112)
Creatinine, Ser: 0.99 mg/dL (ref 0.50–1.10)
Glucose, Bld: 132 mg/dL — ABNORMAL HIGH (ref 70–99)
Potassium: 3.7 mEq/L (ref 3.5–5.1)

## 2013-02-19 MED ORDER — DOXYCYCLINE HYCLATE 50 MG PO CAPS: 50.0000 mg | ORAL_CAPSULE | Freq: Two times a day (BID) | ORAL | Status: AC

## 2013-02-19 MED ORDER — LACTULOSE 10 GM/15ML PO SOLN: 30.0000 g | Freq: Every day | ORAL | Status: AC | PRN

## 2013-02-19 NOTE — Progress Notes (Signed)
[facility] transportation arranged  

## 2013-02-19 NOTE — Discharge Summary (Signed)
Physician Discharge Summary  [name] ZOX:[id number] DOB: 1945-07-31 DOA: 02/17/2013  PCP: Marletta Lor, NP  Admit date: 02/17/2013 Discharge date: 02/19/2013  Time spent: >35 minutes  Recommendations for Outpatient Follow-up:  F/u with hospice care  Discharge Diagnoses:  Active Problems:   HYPOTHYROIDISM   HYPOKALEMIA   AORTIC STENOSIS   CONGESTIVE HEART FAILURE UNSPECIFIED   Cellulitis   Chronic diastolic CHF (congestive heart failure)   Morbid obesity   Metabolic alkalosis   Hyperosmolarity syndrome   Fluid volume depletion   DNR no code (do not resuscitate)   Hyperosmolar syndrome   Discharge Condition: stable   Diet recommendation:  heart healthy  Filed Weights   02/17/13 2100  Weight: 104.4 kg (230 lb 2.6 oz)    History of present illness:  67 y.o. female with morbid obesity, aortic stenosis, multifactorial heart failure, COPD, hypothyroidism, DM, chronic lower extremity venous stasis and recurrent lower extremity cellulitis, admitted in July with cellulitis and found to have Staphylococcus Aureus baccteremia (MSSA), under hospice care, recent increase in her diuresis and addition of Demadex and Zoroxylin, brought to the ER as she has been feeling weak, and having nausea and vomting. Evalaution in the ER showed that she is in hyperosmolar state, with BS of 500's, Bicarb of 44, and K of less than 2.0.   Hospital Course:  1. Hypokalemia, with contraction alkalosis due to over diuresis with lasix/metalazone   -resolved  IV/PO kcl, resume torsemide+ KCL; outpatient follow up   2. Mild LE cellulitis chronic skin dermitis, venous stasis;  -cont PO atx; no s/s of systemic infection   3. Acute on chronic respiratory failure likely due to combination of COPD+ CHF/AS  -improved on oxygen, prn inhalers, resume diuretics 4. Chronic CHF/AS ; echo (09/2012): LVEF 70%, sever AS, Mean gradient: 50mm  -clinically stable; resume torsemide, hold metalazone, amy use as  needed;   Patient is under hospice care at home   Procedures:  None  (i.e. Studies not automatically included, echos, thoracentesis, etc; not x-rays)  Consultations:  None   Discharge Exam: Filed Vitals:   02/19/13 1100  BP: 113/35  Pulse: 98  Temp:   Resp: 21    General: alert Cardiovascular: s1,s2 rrr Respiratory: CTA BL  Discharge Instructions  Discharge Orders   Future Orders Complete By Expires   Diet - low sodium heart healthy  As directed    Discharge instructions  As directed    Comments:     Please follow up with hospice care   Increase activity slowly  As directed        Medication List    STOP taking these medications       metolazone 2.5 MG tablet  Commonly known as:  ZAROXOLYN      TAKE these medications       acetaminophen 325 MG tablet  Commonly known as:  TYLENOL  Take 650 mg by mouth every 4 (four) hours as needed for moderate pain.     ALPRAZolam 1 MG tablet  Commonly known as:  XANAX  Take 1 mg by mouth 3 (three) times daily as needed for anxiety.     bisacodyl 10 MG suppository  Commonly known as:  DULCOLAX  Place 10 mg rectally as needed for moderate constipation.     carvedilol 12.5 MG tablet  Commonly known as:  COREG  Take 12.5 mg by mouth 2 (two) times daily with a meal.     doxycycline 50 MG capsule  Commonly known as:  VIBRAMYCIN  Take 1 capsule (50 mg total) by mouth 2 (two) times daily.     eucerin cream  Apply 1 application topically 3 (three) times a week. Monday, Wednesday and Friday     famotidine 20 MG tablet  Commonly known as:  PEPCID  Take 20 mg by mouth daily as needed for heartburn or indigestion.     furosemide 10 MG/ML solution  Commonly known as:  LASIX  Take 80 mg by mouth as needed (emergent acute dyspnea).     insulin glargine 100 UNIT/ML injection  Commonly known as:  LANTUS  Inject 30 Units into the skin at bedtime.     lactulose 10 GM/15ML solution  Commonly known as:  CHRONULAC  Take  45 mLs (30 g total) by mouth daily as needed for mild constipation.     levothyroxine 137 MCG tablet  Commonly known as:  SYNTHROID, LEVOTHROID  Take 137 mcg by mouth daily before breakfast.     magnesium hydroxide 400 MG/5ML suspension  Commonly known as:  MILK OF MAGNESIA  Take 30 mLs by mouth daily as needed for moderate constipation.     nitroGLYCERIN 0.4 MG SL tablet  Commonly known as:  NITROSTAT  Place 0.4 mg under the tongue every 5 (five) minutes as needed for chest pain.     nystatin 100000 UNIT/ML suspension  Commonly known as:  MYCOSTATIN  Take 10 mLs by mouth 4 (four) times daily.     oxyCODONE 5 MG immediate release tablet  Commonly known as:  Oxy IR/ROXICODONE  Take 5 mg by mouth every 6 (six) hours as needed for breakthrough pain.     OxyCODONE 20 mg T12a 12 hr tablet  Commonly known as:  OXYCONTIN  Take 20 mg by mouth every 12 (twelve) hours as needed (pain).     potassium chloride SA 20 MEQ tablet  Commonly known as:  K-DUR,KLOR-CON  Take 20 mEq by mouth 3 (three) times daily.     prochlorperazine 10 MG tablet  Commonly known as:  COMPAZINE  Take 10 mg by mouth every 6 (six) hours as needed for nausea or vomiting.     sennosides-docusate sodium 8.6-50 MG tablet  Commonly known as:  SENOKOT-S  Take 1 tablet by mouth daily.     torsemide 20 MG tablet  Commonly known as:  DEMADEX  Take 20 mg by mouth 2 (two) times daily.       Allergies  Allergen Reactions  . Keflex [Cephalexin] Nausea And Vomiting    Blisters in mouth and back of tongue  . Latex Itching  . Penicillins Hives     tolerates Ancef       Follow-up Information   Follow up with Marletta Lor, NP In 1 week.   Specialty:  Nurse Practitioner   Contact information:   [facility] [address] [place] [address] [phone]        The results of significant diagnostics from this hospitalization (including imaging, microbiology, ancillary and  laboratory) are listed below for reference.    Significant Diagnostic Studies: Dg Chest Port 1 View  02/17/2013   CLINICAL DATA:  Hyperglycemia.  EXAM: PORTABLE CHEST - 1 VIEW  COMPARISON:  Portable examinations 10/19/2012 through 10/22/2012. Two-view chest x-ray 10/13/2012.  FINDINGS: Suboptimal inspiration due to body habitus accounts for crowded bronchovascular markings diffusely and bibasilar atelectasis, and also accentuates the cardiac silhouette. Taking this into account, cardiac silhouette moderately enlarged but stable. Pulmonary venous hypertension without  overt edema currently. Lungs otherwise clear. No visible pleural effusions.  IMPRESSION: 1. Suboptimal inspiration accounts for bibasilar atelectasis. No acute cardiopulmonary disease otherwise. 2. Stable moderate cardiomegaly. Pulmonary venous hypertension without overt edema currently.   Electronically Signed   By: Hulan Saas M.D.   On: 02/17/2013 17:18    Microbiology: Recent Results (from the past 240 hour(s))  MRSA PCR SCREENING     Status: None   Collection Time    02/17/13  9:46 PM      Result Value Range Status   MRSA by PCR NEGATIVE  NEGATIVE Final   Comment:            The GeneXpert MRSA Assay (FDA     approved for NASAL specimens     only), is one component of a     comprehensive MRSA colonization     surveillance program. It is not     intended to diagnose MRSA     infection nor to guide or     monitor treatment for     MRSA infections.     Labs: Basic Metabolic Panel:  Recent Labs Lab 02/17/13 1734 02/18/13 0600 02/18/13 1025 02/19/13 0428  NA 130* 136 137 137  K <2.0* 2.3* 2.3* 3.7  CL 77* 89* 91* 99  CO2 44* 40* 36* 28  GLUCOSE 474* 105* 129* 132*  BUN 32* 28* 25* 16  CREATININE 1.45* 1.29* 1.19* 0.99  CALCIUM 8.7 8.2* 7.7* 7.5*  MG 2.3  --   --   --    Liver Function Tests:  Recent Labs Lab 02/17/13 1734 02/18/13 0600  AST 23 27  ALT 14 13  ALKPHOS 148* 130*  BILITOT 1.3* 1.0   PROT 7.1 6.7  ALBUMIN 2.9* 2.8*   No results found for this basename: LIPASE, AMYLASE,  in the last 168 hours No results found for this basename: AMMONIA,  in the last 168 hours CBC:  Recent Labs Lab 02/17/13 1734 02/18/13 0600  WBC 7.2 9.9  NEUTROABS 5.5  --   HGB 8.9* 9.0*  HCT 28.3* 28.4*  MCV 79.7 80.0  PLT 123* 132*   Cardiac Enzymes: No results found for this basename: CKTOTAL, CKMB, CKMBINDEX, TROPONINI,  in the last 168 hours BNP: BNP (last 3 results)  Recent Labs  10/13/12 1419  PROBNP 694.1*   CBG:  Recent Labs Lab 02/18/13 1543 02/18/13 1939 02/18/13 2357 02/19/13 0423 02/19/13 0826  GLUCAP 295* 280* 188* 160* 94       Signed:  Romelle Reiley N  [facility] 02/19/2013, 12:02 PM

## 2013-03-10 ENCOUNTER — Inpatient Hospital Stay (HOSPITAL_COMMUNITY)
Admission: EM | Admit: 2013-03-10 | Discharge: 2013-03-15 | DRG: 208 | Disposition: A | Discharge status: Hospice | Attending: Pulmonary Disease | Admitting: Pulmonary Disease

## 2013-03-10 ENCOUNTER — Emergency Department (HOSPITAL_COMMUNITY)

## 2013-03-10 ENCOUNTER — Encounter (HOSPITAL_COMMUNITY): Payer: Self-pay | Admitting: Emergency Medicine

## 2013-03-10 ENCOUNTER — Encounter: Payer: Self-pay | Admitting: Pulmonary Disease

## 2013-03-10 ENCOUNTER — Other Ambulatory Visit: Payer: Self-pay

## 2013-03-10 DIAGNOSIS — Z6841 Body Mass Index (BMI) 40.0 and over, adult: Secondary | ICD-10-CM

## 2013-03-10 DIAGNOSIS — G9340 Encephalopathy, unspecified: Secondary | ICD-10-CM | POA: Diagnosis present

## 2013-03-10 DIAGNOSIS — I5033 Acute on chronic diastolic (congestive) heart failure: Secondary | ICD-10-CM | POA: Diagnosis present

## 2013-03-10 DIAGNOSIS — Z794 Long term (current) use of insulin: Secondary | ICD-10-CM

## 2013-03-10 DIAGNOSIS — D649 Anemia, unspecified: Secondary | ICD-10-CM | POA: Diagnosis present

## 2013-03-10 DIAGNOSIS — K7460 Unspecified cirrhosis of liver: Secondary | ICD-10-CM | POA: Diagnosis present

## 2013-03-10 DIAGNOSIS — J9600 Acute respiratory failure, unspecified whether with hypoxia or hypercapnia: Principal | ICD-10-CM | POA: Diagnosis present

## 2013-03-10 DIAGNOSIS — I359 Nonrheumatic aortic valve disorder, unspecified: Secondary | ICD-10-CM | POA: Diagnosis present

## 2013-03-10 DIAGNOSIS — E119 Type 2 diabetes mellitus without complications: Secondary | ICD-10-CM | POA: Diagnosis present

## 2013-03-10 DIAGNOSIS — F3289 Other specified depressive episodes: Secondary | ICD-10-CM | POA: Diagnosis present

## 2013-03-10 DIAGNOSIS — F329 Major depressive disorder, single episode, unspecified: Secondary | ICD-10-CM | POA: Diagnosis present

## 2013-03-10 DIAGNOSIS — M129 Arthropathy, unspecified: Secondary | ICD-10-CM | POA: Diagnosis present

## 2013-03-10 DIAGNOSIS — J449 Chronic obstructive pulmonary disease, unspecified: Secondary | ICD-10-CM | POA: Diagnosis present

## 2013-03-10 DIAGNOSIS — I5032 Chronic diastolic (congestive) heart failure: Secondary | ICD-10-CM | POA: Diagnosis present

## 2013-03-10 DIAGNOSIS — J4489 Other specified chronic obstructive pulmonary disease: Secondary | ICD-10-CM | POA: Diagnosis present

## 2013-03-10 DIAGNOSIS — I739 Peripheral vascular disease, unspecified: Secondary | ICD-10-CM | POA: Diagnosis present

## 2013-03-10 DIAGNOSIS — I350 Nonrheumatic aortic (valve) stenosis: Secondary | ICD-10-CM

## 2013-03-10 DIAGNOSIS — J9620 Acute and chronic respiratory failure, unspecified whether with hypoxia or hypercapnia: Secondary | ICD-10-CM | POA: Insufficient documentation

## 2013-03-10 DIAGNOSIS — I509 Heart failure, unspecified: Secondary | ICD-10-CM | POA: Diagnosis present

## 2013-03-10 DIAGNOSIS — F17200 Nicotine dependence, unspecified, uncomplicated: Secondary | ICD-10-CM | POA: Diagnosis present

## 2013-03-10 DIAGNOSIS — G8929 Other chronic pain: Secondary | ICD-10-CM | POA: Diagnosis present

## 2013-03-10 DIAGNOSIS — K5900 Constipation, unspecified: Secondary | ICD-10-CM | POA: Diagnosis present

## 2013-03-10 DIAGNOSIS — Z79899 Other long term (current) drug therapy: Secondary | ICD-10-CM

## 2013-03-10 DIAGNOSIS — E039 Hypothyroidism, unspecified: Secondary | ICD-10-CM | POA: Diagnosis present

## 2013-03-10 DIAGNOSIS — R570 Cardiogenic shock: Secondary | ICD-10-CM | POA: Diagnosis present

## 2013-03-10 DIAGNOSIS — N390 Urinary tract infection, site not specified: Secondary | ICD-10-CM | POA: Diagnosis present

## 2013-03-10 DIAGNOSIS — E876 Hypokalemia: Secondary | ICD-10-CM | POA: Diagnosis present

## 2013-03-10 DIAGNOSIS — I10 Essential (primary) hypertension: Secondary | ICD-10-CM | POA: Diagnosis present

## 2013-03-10 DIAGNOSIS — Z66 Do not resuscitate: Secondary | ICD-10-CM | POA: Diagnosis present

## 2013-03-10 DIAGNOSIS — J9690 Respiratory failure, unspecified, unspecified whether with hypoxia or hypercapnia: Secondary | ICD-10-CM

## 2013-03-10 DIAGNOSIS — Z515 Encounter for palliative care: Secondary | ICD-10-CM

## 2013-03-10 DIAGNOSIS — R4182 Altered mental status, unspecified: Secondary | ICD-10-CM

## 2013-03-10 DIAGNOSIS — I872 Venous insufficiency (chronic) (peripheral): Secondary | ICD-10-CM | POA: Diagnosis present

## 2013-03-10 DIAGNOSIS — J810 Acute pulmonary edema: Secondary | ICD-10-CM | POA: Diagnosis present

## 2013-03-10 HISTORY — DX: Encounter for palliative care: Z51.5

## 2013-03-10 LAB — URINE MICROSCOPIC-ADD ON

## 2013-03-10 LAB — COMPREHENSIVE METABOLIC PANEL
ALT: 18 U/L (ref 0–35)
AST: 34 U/L (ref 0–37)
Albumin: 2.4 g/dL — ABNORMAL LOW (ref 3.5–5.2)
Alkaline Phosphatase: 147 U/L — ABNORMAL HIGH (ref 39–117)
BILIRUBIN TOTAL: 1 mg/dL (ref 0.3–1.2)
BUN: 19 mg/dL (ref 6–23)
CO2: 29 meq/L (ref 19–32)
Calcium: 7.8 mg/dL — ABNORMAL LOW (ref 8.4–10.5)
Chloride: 92 mEq/L — ABNORMAL LOW (ref 96–112)
Creatinine, Ser: 1.26 mg/dL — ABNORMAL HIGH (ref 0.50–1.10)
GFR calc Af Amer: 50 mL/min — ABNORMAL LOW (ref >90)
GFR, EST NON AFRICAN AMERICAN: 43 mL/min — AB (ref >90)
GLUCOSE: 333 mg/dL — AB (ref 70–99)
Potassium: 2.5 mEq/L — CL (ref 3.7–5.3)
Sodium: 135 mEq/L — ABNORMAL LOW (ref 137–147)
Total Protein: 6.5 g/dL (ref 6.0–8.3)

## 2013-03-10 LAB — CBC WITH DIFFERENTIAL/PLATELET
Basophils Absolute: 0 K/uL (ref 0.0–0.1)
Basophils Relative: 0 % (ref 0–1)
Eosinophils Absolute: 0 K/uL (ref 0.0–0.7)
Eosinophils Relative: 0 % (ref 0–5)
HCT: 27.7 % — ABNORMAL LOW (ref 36.0–46.0)
Hemoglobin: 8.2 g/dL — ABNORMAL LOW (ref 12.0–15.0)
Lymphocytes Relative: 10 % — ABNORMAL LOW (ref 12–46)
Lymphs Abs: 1 K/uL (ref 0.7–4.0)
MCH: 23.3 pg — ABNORMAL LOW (ref 26.0–34.0)
MCHC: 29.6 g/dL — ABNORMAL LOW (ref 30.0–36.0)
MCV: 78.7 fL (ref 78.0–100.0)
Monocytes Absolute: 0.7 K/uL (ref 0.1–1.0)
Monocytes Relative: 7 % (ref 3–12)
Neutro Abs: 8.5 K/uL — ABNORMAL HIGH (ref 1.7–7.7)
Neutrophils Relative %: 83 % — ABNORMAL HIGH (ref 43–77)
Platelets: 70 K/uL — ABNORMAL LOW (ref 150–400)
RBC: 3.52 MIL/uL — ABNORMAL LOW (ref 3.87–5.11)
RDW: 17.1 % — ABNORMAL HIGH (ref 11.5–15.5)
WBC: 10.3 K/uL (ref 4.0–10.5)

## 2013-03-10 LAB — URINALYSIS, ROUTINE W REFLEX MICROSCOPIC
Bilirubin Urine: NEGATIVE
GLUCOSE, UA: 250 mg/dL — AB
Ketones, ur: NEGATIVE mg/dL
Leukocytes, UA: NEGATIVE
Nitrite: NEGATIVE
PH: 6.5 (ref 5.0–8.0)
Protein, ur: NEGATIVE mg/dL
SPECIFIC GRAVITY, URINE: 1.01 (ref 1.005–1.030)
Urobilinogen, UA: 0.2 mg/dL (ref 0.0–1.0)

## 2013-03-10 LAB — POCT I-STAT 3, VENOUS BLOOD GAS (G3P V)
Acid-Base Excess: 7 mmol/L — ABNORMAL HIGH (ref 0.0–2.0)
BICARBONATE: 32.4 meq/L — AB (ref 20.0–24.0)
O2 SAT: 98 %
PCO2 VEN: 51 mmHg — AB (ref 45.0–50.0)
PH VEN: 7.411 — AB (ref 7.250–7.300)
PO2 VEN: 110 mmHg — AB (ref 30.0–45.0)
TCO2: 34 mmol/L (ref 0–100)

## 2013-03-10 LAB — APTT: aPTT: 32 seconds (ref 24–37)

## 2013-03-10 LAB — GLUCOSE, CAPILLARY
GLUCOSE-CAPILLARY: 308 mg/dL — AB (ref 70–99)
Glucose-Capillary: 332 mg/dL — ABNORMAL HIGH (ref 70–99)

## 2013-03-10 LAB — MAGNESIUM: Magnesium: 1.9 mg/dL (ref 1.5–2.5)

## 2013-03-10 LAB — PRO B NATRIURETIC PEPTIDE: Pro B Natriuretic peptide (BNP): 1014 pg/mL — ABNORMAL HIGH (ref 0–125)

## 2013-03-10 LAB — MRSA PCR SCREENING: MRSA by PCR: NEGATIVE

## 2013-03-10 LAB — LACTIC ACID, PLASMA: LACTIC ACID, VENOUS: 2.3 mmol/L — AB (ref 0.5–2.2)

## 2013-03-10 LAB — CORTISOL: Cortisol, Plasma: 33.2 ug/dL

## 2013-03-10 LAB — PHOSPHORUS: Phosphorus: 3.1 mg/dL (ref 2.3–4.6)

## 2013-03-10 LAB — PROTIME-INR
INR: 1.24 (ref 0.00–1.49)
Prothrombin Time: 15.3 seconds — ABNORMAL HIGH (ref 11.6–15.2)

## 2013-03-10 LAB — TROPONIN I

## 2013-03-10 MED ORDER — HYDROCERIN EX CREA
1.0000 "application " | TOPICAL_CREAM | Freq: Every day | CUTANEOUS | Status: DC
  Administered 2013-03-10 – 2013-03-15 (×6): 1 via TOPICAL
  Filled 2013-03-10: qty 113

## 2013-03-10 MED ORDER — CIPROFLOXACIN HCL 500 MG PO TABS
500.0000 mg | ORAL_TABLET | Freq: Two times a day (BID) | ORAL | Status: DC
  Administered 2013-03-10 – 2013-03-11 (×2): 500 mg via ORAL
  Filled 2013-03-10 (×4): qty 1

## 2013-03-10 MED ORDER — CARVEDILOL 12.5 MG PO TABS
12.5000 mg | ORAL_TABLET | Freq: Two times a day (BID) | ORAL | Status: DC
  Administered 2013-03-12 – 2013-03-15 (×6): 12.5 mg via ORAL
  Filled 2013-03-10 (×12): qty 1

## 2013-03-10 MED ORDER — PANTOPRAZOLE SODIUM 40 MG IV SOLR
40.0000 mg | Freq: Every day | INTRAVENOUS | Status: DC
  Administered 2013-03-10 – 2013-03-12 (×3): 40 mg via INTRAVENOUS
  Filled 2013-03-10 (×4): qty 40

## 2013-03-10 MED ORDER — FAMOTIDINE 20 MG PO TABS
20.0000 mg | ORAL_TABLET | Freq: Two times a day (BID) | ORAL | Status: DC
  Administered 2013-03-10 – 2013-03-15 (×10): 20 mg via ORAL
  Filled 2013-03-10 (×11): qty 1

## 2013-03-10 MED ORDER — IPRATROPIUM-ALBUTEROL 0.5-2.5 (3) MG/3ML IN SOLN
3.0000 mL | Freq: Two times a day (BID) | RESPIRATORY_TRACT | Status: DC
  Administered 2013-03-11 – 2013-03-15 (×8): 3 mL via RESPIRATORY_TRACT
  Filled 2013-03-10 (×9): qty 3

## 2013-03-10 MED ORDER — TORSEMIDE 20 MG PO TABS
40.0000 mg | ORAL_TABLET | Freq: Two times a day (BID) | ORAL | Status: DC
  Administered 2013-03-10 – 2013-03-15 (×10): 40 mg via ORAL
  Filled 2013-03-10 (×12): qty 2

## 2013-03-10 MED ORDER — POTASSIUM CHLORIDE 10 MEQ/100ML IV SOLN
10.0000 meq | INTRAVENOUS | Status: AC
  Administered 2013-03-10 (×2): 10 meq via INTRAVENOUS
  Filled 2013-03-10 (×2): qty 100

## 2013-03-10 MED ORDER — MAGNESIUM HYDROXIDE 400 MG/5ML PO SUSP
30.0000 mL | Freq: Every day | ORAL | Status: DC | PRN
  Filled 2013-03-10: qty 30

## 2013-03-10 MED ORDER — FUROSEMIDE 10 MG/ML IJ SOLN
80.0000 mg | Freq: Two times a day (BID) | INTRAMUSCULAR | Status: DC
  Administered 2013-03-10 – 2013-03-12 (×4): 80 mg via INTRAVENOUS
  Filled 2013-03-10 (×6): qty 8

## 2013-03-10 MED ORDER — METOLAZONE 2.5 MG PO TABS
2.5000 mg | ORAL_TABLET | ORAL | Status: DC
  Administered 2013-03-10 – 2013-03-14 (×2): 2.5 mg via ORAL
  Filled 2013-03-10 (×3): qty 1

## 2013-03-10 MED ORDER — FENTANYL CITRATE 0.05 MG/ML IJ SOLN
INTRAMUSCULAR | Status: AC
  Filled 2013-03-10: qty 2

## 2013-03-10 MED ORDER — DOPAMINE-DEXTROSE 3.2-5 MG/ML-% IV SOLN
2.0000 ug/kg/min | INTRAVENOUS | Status: DC
  Administered 2013-03-10: 3 ug/kg/min via INTRAVENOUS
  Filled 2013-03-10: qty 250

## 2013-03-10 MED ORDER — POTASSIUM CHLORIDE CRYS ER 20 MEQ PO TBCR
40.0000 meq | EXTENDED_RELEASE_TABLET | ORAL | Status: AC
  Administered 2013-03-10: 40 meq via ORAL
  Filled 2013-03-10 (×2): qty 2

## 2013-03-10 MED ORDER — LEVOTHYROXINE SODIUM 137 MCG PO TABS
137.0000 ug | ORAL_TABLET | Freq: Every day | ORAL | Status: DC
  Administered 2013-03-11 – 2013-03-15 (×5): 137 ug via ORAL
  Filled 2013-03-10 (×7): qty 1

## 2013-03-10 MED ORDER — MIDAZOLAM HCL 2 MG/2ML IJ SOLN
INTRAMUSCULAR | Status: AC
  Filled 2013-03-10: qty 6

## 2013-03-10 MED ORDER — POTASSIUM CHLORIDE CRYS ER 20 MEQ PO TBCR
60.0000 meq | EXTENDED_RELEASE_TABLET | Freq: Two times a day (BID) | ORAL | Status: DC
  Administered 2013-03-11 – 2013-03-12 (×4): 60 meq via ORAL
  Filled 2013-03-10 (×6): qty 3

## 2013-03-10 MED ORDER — FENTANYL CITRATE 0.05 MG/ML IJ SOLN
100.0000 ug | Freq: Once | INTRAMUSCULAR | Status: AC
  Administered 2013-03-10: 100 ug via INTRAVENOUS

## 2013-03-10 MED ORDER — SODIUM CHLORIDE 0.9 % IV SOLN
INTRAVENOUS | Status: DC
  Administered 2013-03-10 – 2013-03-11 (×2): via INTRAVENOUS

## 2013-03-10 MED ORDER — FENTANYL CITRATE 0.05 MG/ML IJ SOLN
INTRAMUSCULAR | Status: AC | PRN
  Administered 2013-03-10: 100 ug via INTRAVENOUS

## 2013-03-10 MED ORDER — NITROGLYCERIN 0.4 MG SL SUBL: 0.4000 mg | SUBLINGUAL_TABLET | SUBLINGUAL | Status: DC | PRN

## 2013-03-10 MED ORDER — DEXTROSE 5 % IV SOLN
30.0000 ug/min | INTRAVENOUS | Status: DC
  Administered 2013-03-10: 30 ug/min via INTRAVENOUS
  Administered 2013-03-10 – 2013-03-11 (×2): 35 ug/min via INTRAVENOUS
  Filled 2013-03-10 (×3): qty 1

## 2013-03-10 MED ORDER — ALPRAZOLAM 0.5 MG PO TABS
1.0000 mg | ORAL_TABLET | Freq: Three times a day (TID) | ORAL | Status: DC | PRN
  Administered 2013-03-13 – 2013-03-14 (×2): 1 mg via ORAL
  Filled 2013-03-10 (×2): qty 2

## 2013-03-10 MED ORDER — OXYCODONE HCL ER 20 MG PO T12A
20.0000 mg | EXTENDED_RELEASE_TABLET | Freq: Two times a day (BID) | ORAL | Status: DC
  Administered 2013-03-10 – 2013-03-15 (×10): 20 mg via ORAL
  Filled 2013-03-10 (×3): qty 1
  Filled 2013-03-10 (×2): qty 2
  Filled 2013-03-10: qty 1
  Filled 2013-03-10 (×2): qty 2
  Filled 2013-03-10 (×2): qty 1

## 2013-03-10 MED ORDER — HEPARIN SODIUM (PORCINE) 5000 UNIT/ML IJ SOLN
5000.0000 [IU] | Freq: Three times a day (TID) | INTRAMUSCULAR | Status: DC
  Administered 2013-03-10 – 2013-03-11 (×2): 5000 [IU] via SUBCUTANEOUS
  Filled 2013-03-10 (×6): qty 1

## 2013-03-10 MED ORDER — ACETAMINOPHEN 325 MG PO TABS
650.0000 mg | ORAL_TABLET | ORAL | Status: DC | PRN
  Administered 2013-03-12: 650 mg via ORAL
  Filled 2013-03-10: qty 2

## 2013-03-10 MED ORDER — PROCHLORPERAZINE MALEATE 10 MG PO TABS
10.0000 mg | ORAL_TABLET | Freq: Four times a day (QID) | ORAL | Status: DC | PRN
  Filled 2013-03-10: qty 1

## 2013-03-10 MED ORDER — FENTANYL CITRATE 0.05 MG/ML IJ SOLN
25.0000 ug | INTRAMUSCULAR | Status: DC | PRN
  Administered 2013-03-10: 100 ug via INTRAVENOUS
  Filled 2013-03-10: qty 2

## 2013-03-10 MED ORDER — BISACODYL 10 MG RE SUPP
10.0000 mg | Freq: Every day | RECTAL | Status: DC | PRN
  Administered 2013-03-14: 10 mg via RECTAL
  Filled 2013-03-10: qty 1

## 2013-03-10 MED ORDER — INSULIN ASPART 100 UNIT/ML ~~LOC~~ SOLN
6.0000 [IU] | Freq: Three times a day (TID) | SUBCUTANEOUS | Status: DC
  Administered 2013-03-10 – 2013-03-11 (×3): 10 [IU] via SUBCUTANEOUS

## 2013-03-10 MED ORDER — INSULIN GLARGINE 100 UNIT/ML ~~LOC~~ SOLN
35.0000 [IU] | Freq: Two times a day (BID) | SUBCUTANEOUS | Status: DC
  Administered 2013-03-10 – 2013-03-11 (×2): 35 [IU] via SUBCUTANEOUS
  Filled 2013-03-10 (×3): qty 0.35

## 2013-03-10 MED ORDER — OXYCODONE HCL 5 MG PO TABS
5.0000 mg | ORAL_TABLET | Freq: Four times a day (QID) | ORAL | Status: DC | PRN
  Administered 2013-03-10 – 2013-03-13 (×3): 5 mg via ORAL
  Filled 2013-03-10 (×3): qty 1

## 2013-03-10 MED ORDER — MIDAZOLAM HCL 10 MG/2ML IJ SOLN: 5.0000 mg | Freq: Once | INTRAMUSCULAR | Status: DC

## 2013-03-10 MED ORDER — MIDAZOLAM HCL 5 MG/5ML IJ SOLN
INTRAMUSCULAR | Status: AC | PRN
Start: 2013-03-10 — End: 2013-03-10
  Administered 2013-03-10: 5 mg via INTRAVENOUS

## 2013-03-10 NOTE — Procedures (Signed)
Extubation Procedure Note  Patient Details:   Name: [name] GibbonsStella [name] DOB: [birth year] MRN: [id number]   Airway Documentation:  Airway 8 mm (Active)  Secured at (cm) 24 cm 03/10/2013 11:43 AM  Measured From Lips 03/10/2013 11:43 AM  Secured Location Right 03/10/2013 11:43 AM  Secured By [employer] Tube Holder 03/10/2013 11:43 AM    Evaluation  O2 sats: stable throughout and currently acceptable Complications: No apparent complications Patient did tolerate procedure well. Bilateral Breath Sounds: Rhonchi;Diminished Suctioning: Airway Yes  Pt. Extubated per order.   Antoine Pocherogdon, Idalie Canto Caroline 03/10/2013, 2:00 PM

## 2013-03-10 NOTE — ED Notes (Signed)
MD at bedside. Brett CanalesSteve Minor PA with PCCM

## 2013-03-10 NOTE — Progress Notes (Signed)
Utilization Review Completed.[name] T1/15/2015  

## 2013-03-10 NOTE — Progress Notes (Signed)
Patient ID: [name] GibbonsStella [name], female   DOB: [birth year], 68 y.o.   MRN: [id number]  k low supp

## 2013-03-10 NOTE — ED Notes (Signed)
Foley 14 French Temp foley placed

## 2013-03-10 NOTE — ED Notes (Signed)
Pt here s/p resp arrest, pt called ems for sob x 3 hours, on Ambulance pt had episode of respiratory arrest, intubated by EMS with 8.0 tube, 24 at teeth. Pt was trialed on bipap and nebs prior to intubation with no change in status prior to intubation

## 2013-03-10 NOTE — ED Provider Notes (Signed)
CSN: [id number]     Arrival date & time 03/10/13  1126 History   None    Chief Complaint  Patient presents with  . Respiratory Distress   (Consider location/radiation/quality/duration/timing/severity/associated sxs/prior Treatment) HPI Comments: 68 year old white female presents emergency department via EMS for acute respiratory distress/arrest. History was obtained per EMS as the patient was intubated. Patient was reportedly in respiratory distress at home when EMS was called. Upon arrival EMS noted the patient to be hypoxic and tachypnea. Her pulse ox was in the 70. A trial of BiPAP was initiated however the patient continued to decompensate and she became apneic. She was intubated in the field.      Patient is a 68 y.o. female presenting with shortness of breath. The history is provided by the EMS personnel. The history is limited by the condition of the patient.  Shortness of Breath Severity:  Severe Onset quality:  Gradual Duration:  1 day Progression:  Worsening Chronicity:  Recurrent Relieved by: BiPap --> Intubation by EMS.   CODE STATUS: Pt is DNR.    No past medical history on file. No past surgical history on file. No family history on file. History  Substance Use Topics  . Smoking status: Not on file  . Smokeless tobacco: Not on file  . Alcohol Use: Not on file   OB History   No data available     Review of Systems  Unable to perform ROS: Intubated  Respiratory: Positive for shortness of breath.     Allergies  Review of patient's allergies indicates not on file.  Home Medications  No current outpatient prescriptions on file. BP 107/36  Pulse 115  Resp 20  SpO2 100% Physical Exam  Vitals reviewed. Constitutional: She appears toxic. She is intubated.  68 year old female intubated. She is spontaneously opening her eyes and moving all extremities.  HENT:  Head: Normocephalic and atraumatic.  Neck: Neck supple. No JVD present.  Cardiovascular:  Regular rhythm.  Tachycardia present.   Pulmonary/Chest: She is intubated. She has decreased breath sounds in the left upper field, the left middle field and the left lower field.  Decreased breath sounds on the left lung fields. Diffuse crackles throughout. Patient is intubated however she is overbreathing the packing  Abdominal: Soft. She exhibits distension. She exhibits no mass. There is no tenderness. There is no rebound and no guarding.  Musculoskeletal: She exhibits edema.  Significant bilateral lower extremity edema noted to knees  Neurological: GCS eye subscore is 4. GCS verbal subscore is 1. GCS motor subscore is 6.  Patient is intubated and she received Versed in route. Upon arrival she had spontaneous opening of eyes and was moving all extremities were    ED Course  Procedures (including critical care time) Labs Review Labs Reviewed  CBC WITH DIFFERENTIAL  COMPREHENSIVE METABOLIC PANEL  LACTIC ACID, PLASMA  BLOOD GAS, VENOUS  TROPONIN I  PRO B NATRIURETIC PEPTIDE  PROTIME-INR  APTT   Imaging Review No results found.  EKG Interpretation   None       Date: 03/10/2013  Rate: 118  Rhythm: sinus tachycardia  QRS Axis: normal  Intervals: normal  ST/T Wave abnormalities: nonspecific ST changes  Conduction Disutrbances:none  Narrative Interpretation:   Old EKG Reviewed: none available  Results for orders placed during the hospital encounter of 03/10/13  MRSA PCR SCREENING      Result Value Range   MRSA by PCR NEGATIVE  NEGATIVE  CBC WITH DIFFERENTIAL      Result  Value Range   WBC 10.3  4.0 - 10.5 K/uL   RBC 3.52 (*) 3.87 - 5.11 MIL/uL   Hemoglobin 8.2 (*) 12.0 - 15.0 g/dL   HCT 82.9 (*) 56.2 - 13.0 %   MCV 78.7  78.0 - 100.0 fL   MCH 23.3 (*) 26.0 - 34.0 pg   MCHC 29.6 (*) 30.0 - 36.0 g/dL   RDW 86.5 (*) 78.4 - 69.6 %   Platelets 70 (*) 150 - 400 K/uL   Neutrophils Relative % 83 (*) 43 - 77 %   Neutro Abs 8.5 (*) 1.7 - 7.7 K/uL   Lymphocytes Relative 10  (*) 12 - 46 %   Lymphs Abs 1.0  0.7 - 4.0 K/uL   Monocytes Relative 7  3 - 12 %   Monocytes Absolute 0.7  0.1 - 1.0 K/uL   Eosinophils Relative 0  0 - 5 %   Eosinophils Absolute 0.0  0.0 - 0.7 K/uL   Basophils Relative 0  0 - 1 %   Basophils Absolute 0.0  0.0 - 0.1 K/uL  COMPREHENSIVE METABOLIC PANEL      Result Value Range   Sodium 135 (*) 137 - 147 mEq/L   Potassium 2.5 (*) 3.7 - 5.3 mEq/L   Chloride 92 (*) 96 - 112 mEq/L   CO2 29  19 - 32 mEq/L   Glucose, Bld 333 (*) 70 - 99 mg/dL   BUN 19  6 - 23 mg/dL   Creatinine, Ser 2.95 (*) 0.50 - 1.10 mg/dL   Calcium 7.8 (*) 8.4 - 10.5 mg/dL   Total Protein 6.5  6.0 - 8.3 g/dL   Albumin 2.4 (*) 3.5 - 5.2 g/dL   AST 34  0 - 37 U/L   ALT 18  0 - 35 U/L   Alkaline Phosphatase 147 (*) 39 - 117 U/L   Total Bilirubin 1.0  0.3 - 1.2 mg/dL   GFR calc non Af Amer 43 (*) >90 mL/min   GFR calc Af Amer 50 (*) >90 mL/min  TROPONIN I      Result Value Range   Troponin I <0.30  <0.30 ng/mL  PRO B NATRIURETIC PEPTIDE      Result Value Range   Pro B Natriuretic peptide (BNP) 1014.0 (*) 0 - 125 pg/mL  PROTIME-INR      Result Value Range   Prothrombin Time 15.3 (*) 11.6 - 15.2 seconds   INR 1.24  0.00 - 1.49  APTT      Result Value Range   aPTT 32  24 - 37 seconds  MAGNESIUM      Result Value Range   Magnesium 1.9  1.5 - 2.5 mg/dL  PHOSPHORUS      Result Value Range   Phosphorus 3.1  2.3 - 4.6 mg/dL  URINALYSIS, ROUTINE W REFLEX MICROSCOPIC      Result Value Range   Color, Urine YELLOW  YELLOW   APPearance CLEAR  CLEAR   Specific Gravity, Urine 1.010  1.005 - 1.030   pH 6.5  5.0 - 8.0   Glucose, UA 250 (*) NEGATIVE mg/dL   Hgb urine dipstick TRACE (*) NEGATIVE   Bilirubin Urine NEGATIVE  NEGATIVE   Ketones, ur NEGATIVE  NEGATIVE mg/dL   Protein, ur NEGATIVE  NEGATIVE mg/dL   Urobilinogen, UA 0.2  0.0 - 1.0 mg/dL   Nitrite NEGATIVE  NEGATIVE   Leukocytes, UA NEGATIVE  NEGATIVE  URINE MICROSCOPIC-ADD ON      Result Value Range  Squamous Epithelial / LPF RARE  RARE   WBC, UA 0-2  <3 WBC/hpf   RBC / HPF 3-6  <3 RBC/hpf  GLUCOSE, CAPILLARY      Result Value Range   Glucose-Capillary 308 (*) 70 - 99 mg/dL  POCT I-STAT 3, BLOOD GAS (G3P V)      Result Value Range   pH, Ven 7.411 (*) 7.250 - 7.300   pCO2, Ven 51.0 (*) 45.0 - 50.0 mmHg   pO2, Ven 110.0 (*) 30.0 - 45.0 mmHg   Bicarbonate 32.4 (*) 20.0 - 24.0 mEq/L   TCO2 34  0 - 100 mmol/L   O2 Saturation 98.0     Acid-Base Excess 7.0 (*) 0.0 - 2.0 mmol/L   Sample type VENOUS       MDM   1. CHF (congestive heart failure)   2. Respiratory failure    68 year old female with past medical history of diabetes mellitus, CHF, and multiple other medical problems who is reportedly on hospice care presents emergency department status post acute respiratory failure. Patient was intubated in the field. On arrival she was tachycardic and tachypneic however her blood pressure was maintained and she was satting 100%.  Stat primary survey performed. ETT found to be placed in the right mainstem and it was adjusted accordingly. Stat page to Pulm/critical care for admission of the patient  11:45 AM Pulm Crit Care consulted on arrival at approx 1135.  At bedside at 1140.    CRITICAL CARE Performed by: Redgie GrayerMASNERI, DAVID  Total critical care time: 30  Critical care time was exclusive of separately billable procedures and treating other patients.  Critical care was necessary to treat or prevent imminent or life-threatening deterioration.  Critical care was time spent personally by me on the following activities: development of treatment plan with patient and/or surrogate as well as nursing, discussions with consultants, evaluation of patient's response to treatment, examination of patient, obtaining history from patient or surrogate, ordering and performing treatments and interventions, ordering and review of laboratory studies, ordering and review of radiographic studies, pulse  oximetry and re-evaluation of patient's condition.    I discussed care with family upon their arrival.    Care turned over to the critical care team.  Treatment plan and disposition per critical care.  Darlys Galesavid Masneri, MD 03/10/13 [phone]

## 2013-03-10 NOTE — Progress Notes (Signed)
Room [facility] ED Trauma B - [name] GibbonsStella Mitcheltree - [facility]-R.Ahmed Inniss RN  [facility] valve disorder. Pt is DNR code with OOF DNR accompanying pt in ED Room.    Pt alert & oriented, intubated by EMS at residence - husband showed EMS the DNR.  Pt communicating by writing.  Per husband present they will give intubation 24 hours and if she does not improve they will make decision to extubate.  Dtr Stanton Kidneyebra and her husband present. Patient's home medication list is on shadow chart.   Husband called [facility] this am with complaints pt was SOB.  Triage RN guided husband to give pt a neb treatment and RN will be right out.  He returned call within 5 minutes and stated he called EMS.  [facility].  Please call [facility] @ [phone]- ask for RN Liaison or after hours,ask for on-call RN with any hospice needs.   Thank you.  Joneen Boersose B Alani Sabbagh, RN  [facility]  Hospice Liaison  [phone])

## 2013-03-10 NOTE — ED Notes (Signed)
NP at the bedside. Pt request to be extubated.

## 2013-03-10 NOTE — ED Notes (Signed)
Dentures removed and given to family.

## 2013-03-10 NOTE — H&P (Signed)
Name: [name] MRN: [id number] DOB: December 11, 1945    ADMISSION DATE:  03/10/2013  REFERRING MD :  EDP PRIMARY SERVICE: PCCM  CHIEF COMPLAINT:  Resp failure  BRIEF PATIENT DESCRIPTION: 68 yo female with end stage aortic valve disease, DNR who was intubated in field despite DNR. Transported to [facility] ED and PCCM called to admit.  SIGNIFICANT EVENTS / STUDIES:  1-15 intubated  LINES / TUBES: 1-15 OTT>>  CULTURES:   ANTIBIOTICS:   HISTORY OF PRESENT ILLNESS:   68 yo female with end stage aortic valve disease, DNR who was intubated in field despite DNR. Transported to [facility] ED and PCCM called to admit. She is followed by [facility] for her end stage aortic valve. Following discussions with family we will admit x 24 hours, use low dose dopamine and lasix and attempt to extubate with in 24 hours.     PAST MEDICAL HISTORY :  No past medical history on file. No past surgical history on file. Prior to Admission medications   Not on File  See chart merge, reviewed and updated. Allergies not on file  FAMILY HISTORY:  No family history on file. SOCIAL HISTORY:  has no tobacco, alcohol, and drug history on file.  REVIEW OF SYSTEMS:  NA  SUBJECTIVE:  Awake on vent VITAL SIGNS: Pulse Rate:  [108-118] 110 (01/15 1245) Resp:  [20-38] 25 (01/15 1245) BP: (77-107)/(28-45) 103/45 mmHg (01/15 1245) SpO2:  [98 %-100 %] 100 % (01/15 1245) FiO2 (%):  [100 %] 100 % (01/15 1143) HEMODYNAMICS:   VENTILATOR SETTINGS: Vent Mode:  [-] PRVC FiO2 (%):  [100 %] 100 % Set Rate:  [14 bmp] 14 bmp Vt Set:  [420 mL] 420 mL PEEP:  [5 cmH20] 5 cmH20 Plateau Pressure:  [26 cmH20] 26 cmH20 INTAKE / OUTPUT: Intake/Output   None     PHYSICAL EXAMINATION: General: MO wf on vent Neuro: Follows commands on vent HEENT:  No LAN/JVD Cardiovascular: HSD+mumur  Lungs:  Crackles bases Abdomen: Obese + bs Musculoskeletal:  intact Skin:  +++ lower edema with neurofibroma   LABS:  CBC No  results found for this basename: WBC, HGB, HCT, PLT,  in the last 168 hours Coag's No results found for this basename: APTT, INR,  in the last 168 hours BMET No results found for this basename: NA, K, CL, CO2, BUN, CREATININE, GLUCOSE,  in the last 168 hours Electrolytes No results found for this basename: CALCIUM, MG, PHOS,  in the last 168 hours Sepsis Markers No results found for this basename: LATICACIDVEN, PROCALCITON, O2SATVEN,  in the last 168 hours ABG No results found for this basename: PHART, PCO2ART, PO2ART,  in the last 168 hours Liver Enzymes No results found for this basename: AST, ALT, ALKPHOS, BILITOT, ALBUMIN,  in the last 168 hours Cardiac Enzymes No results found for this basename: TROPONINI, PROBNP,  in the last 168 hours Glucose No results found for this basename: GLUCAP,  in the last 168 hours  Imaging Dg Chest Port 1 View  03/10/2013   CLINICAL DATA:  Intubated  EXAM: PORTABLE CHEST - 1 VIEW  COMPARISON:  None.  FINDINGS: Tip of the endotracheal tube is just within the right mainstem bronchus. Recommend withdrawing 4 cm. There are diffuse bilateral interstitial and airspace opacities in predominantly perihilar and dependent distribution. The cardiopericardial silhouette is enlarged. The pulmonary vasculature is diffusely ill-defined consistent with edema. No acute osseous abnormality.  IMPRESSION: 1. The tip of the endotracheal tube is in the right  mainstem bronchus. Recommend withdrawing 3-4 cm. This finding was called to the ER nurse taking care of the patient had noted that the tube has already been pulled back and a new chest x-ray is pending. 2. Diffuse bilateral interstitial and airspace opacities in a predominantly perihilar and bibasilar distribution. The imaging findings are most suggestive of acute pulmonary edema. Additional less likely differential considerations include multifocal pneumonia in diffuse alveolar hemorrhage. 3. Mild enlargement of the  cardiopericardial silhouette.   Electronically Signed   By: Malachy MoanHeath  McCullough M.D.   On: 03/10/2013 12:12     CXR: See above  ASSESSMENT / PLAN:  PULMONARY A:VDRF in setting of end stage valvular disease, CHF P:   24 hours of vent support Extubate with in 24 hours per her wishes  CARDIOVASCULAR A: ES valvular disease      Shock      Acute decompensated heart failure P:  Dopamine to keep sbp>90 Diuresis in attempt to liberate from vent Do not re-intubate  RENAL No results found for this basename: creatinine    A:  No acute issue P:     GASTROINTESTINAL A:  No acute issue P:   Advance diet as tolerated  HEMATOLOGIC A: No acute issue P:    INFECTIOUS A:  No acute issue P:     ENDOCRINE A:  DM P:   SSI  NEUROLOGIC A:  No acute issue P:     TODAY'S SUMMARY:  68 yo female with severe CHF and AS, >DNR< who was intubated in the field. She will be ventilate x 24 hours and extubated to comfort care or to attempt home discharge.  Brett CanalesSteve Minor ACNP [facility] Pager [phone] till 3 pm If no answer page [phone] 03/10/2013, 12:51 PM  Attending:  I have seen and examined the patient with nurse practitioner/resident and agree with the note above.   This is a strange situation.  She has had fevers and increasing dyspnea for the last week. And was actually intubated in the field by EMS against her wishes.  In the ED she was extubated per her demands and has now done well.  I have restarted her home meds  I had a lengthy conversation with her family and confirmed that she is DNR/DNI.  Hopefully she will improve in the next 24 hours and we can move back home with hospice.  Depending on how she does she may need inpatient hospice.  To clarify, we are using her home meds now but our goals for her are comfort first.  Yolonda KidaMCQUAID, DOUGLAS [employer] P[facility] Pager: [phone] Cell: [phone] If no response, call [phone]

## 2013-03-10 NOTE — ED Notes (Signed)
Family at bedside. 

## 2013-03-10 NOTE — Consult Note (Signed)
Reason for Consult: CHF Referring Physician: ERP Cardiologist: [name] is an 68 y.o. female.  HPI:  The patient is a 68 yo morbidly obese female with a history of sev, end-stage AS (previously determined that she is not a candidate for aortic valve replacement or TAVR because of comorbidities), copd, hypothyroidism, PVD, Cardiac arrest 03/2010.  Her last 2 D echo was 09/2012 and EF was 70%normal WM, severe AS with peak velocities of 436cm/s mean gradient and peak . LA mod to sev dilation.   She presents with acute respiratory failure, pulmonary edema.  She was intubated in the field despite a DNR hanging on the wall of the house.  She is alert and answers questions. The patient has noticed progressive weight gain.  She was approx 240 last August around discharge and was ~254 a few days ago at home.  She has been too weak to get on the scale the last few days.  Her husband reports she has had a fever up to 102, nausea, vomiting.  She also reports SOB became acutely worse, orthopnea, worsening LEE,  abd pain and distention.   She was given 80mg  lasix.     Past medical history  Carotid bruit  0-39% bilateral ICA by dopplers 03/2010.  Aortic stenosis  a. Moderate echo 01/2009, then severe 03/2010 --> last echo 09/2011: EF 60-65%, grade 2 diastolic dysfunction, severe aortic stenosis, mean gradient 50, mild MR b. Not an operative candidate due to comorbidities, seen in TAVR clinic 02/2012 but pt deferred further testing. c. low risk myovue 11/2007 breast attenuation EF 63%  Venous insufficiency .  Arthritis .  Depression .  COPD (chronic obstructive pulmonary disease) .  Hypothyroidism .  Thrombocytopenia .  Hypokalemia .  Edema  Respiratory failure  Ventilator-dependent respiratory failure secondary to hypercarbia, cardiac arrest, presumed penumonia 12/2008 .  Cardiac arrest  12/2010 in setting of respiratory failure with less than 15 minutes of CPR; initial rhythm was  asystole .  PVD (peripheral vascular disease)  Eval by Dr. Excell Seltzer 2012: noninvasive immaging suggested significant aortoiliac disease, ABIs in mild range - med rx given significant comorbidities .  H/O endoscopy  For ?ampullary mass - had EGD 07/2010 with no evidence of ampullary mass, some CBD dilitation .  Chronic pain .  Lumbar stenosis  Admitted 09/2011 - not felt to be a good candidate for surgical intervention. .  CHF (congestive heart failure)   Morbid obesity 10/27/2011  Chronic diastolic CHF (congestive heart failure) 10/27/2011   Lower extremity weakness 10/26/2011 .  Spinal stenosis of lumbar region 10/25/2011 .  Diabetes mellitus .  Hypertension .  Bacteremia  a. Adm 08/2012 with MSSA bacteremia, R leg cellulitis. TTE no endocarditis. TEE deferred. 6 weeks abx.   Yeast infection  a. Abdominal. Cirrhosis  a. By CT 09/2012.   Hepatosplenomegaly  a. By CT 09/2012.   Past Surgical History   Thyroidectomy .  Laparoscopic cholecystectomy .  Lithotripsy  for nephrolithiasis .  Vaginal hysterectomy  for menorrhagia at age 38 .  Breast cyst excision .  Rotator cuff repair .  Cesarean section .  Laminectomy   Allergies:  Allergies   Allergen  Reactions   .  Keflex [Cephalexin]  Nausea And Vomiting     Blisters in mouth and back of tongue   .  Latex  Itching   .  Penicillins  Hives     tolerates Ancef   Social History:  reports that she has  been smoking Cigarettes. She has a 90 pack-year smoking history. She uses smokeless tobacco. She reports that she does not drink alcohol or use illicit drugs.  Family History:  Family History   Problem  Relation  Age of Onset   .  Emphysema  Father    .  Allergies  Sister    .  Asthma  Sister    .  Heart disease  Father    .  Cancer  Mother      Medications:  Prior to Admission medications   Medication Sig Start Date End Date Taking? Authorizing Provider  ciprofloxacin (CIPRO) 500 MG tablet Take 500 mg by mouth 2 (two) times daily.  Starting on 03/09/13   Yes Historical Provider, MD     Results for orders placed during the hospital encounter of 03/10/13 (from the past 48 hour(s))  CBC WITH DIFFERENTIAL     Status: Abnormal (Preliminary result)   Collection Time    03/10/13 11:32 AM      Result Value Range   WBC 10.3  4.0 - 10.5 K/uL   RBC 3.52 (*) 3.87 - 5.11 MIL/uL   Hemoglobin 8.2 (*) 12.0 - 15.0 g/dL   HCT 10.2 (*) 72.5 - 36.6 %   MCV 78.7  78.0 - 100.0 fL   MCH 23.3 (*) 26.0 - 34.0 pg   MCHC 29.6 (*) 30.0 - 36.0 g/dL   RDW 44.0 (*) 34.7 - 42.5 %   Platelets PENDING  150 - 400 K/uL   Neutrophils Relative % 83 (*) 43 - 77 %   Neutro Abs 8.5 (*) 1.7 - 7.7 K/uL   Lymphocytes Relative 10 (*) 12 - 46 %   Lymphs Abs 1.0  0.7 - 4.0 K/uL   Monocytes Relative 7  3 - 12 %   Monocytes Absolute 0.7  0.1 - 1.0 K/uL   Eosinophils Relative 0  0 - 5 %   Eosinophils Absolute 0.0  0.0 - 0.7 K/uL   Basophils Relative 0  0 - 1 %   Basophils Absolute 0.0  0.0 - 0.1 K/uL  POCT I-STAT 3, BLOOD GAS (G3P V)     Status: Abnormal   Collection Time    03/10/13  1:18 PM      Result Value Range   pH, Ven 7.411 (*) 7.250 - 7.300   pCO2, Ven 51.0 (*) 45.0 - 50.0 mmHg   pO2, Ven 110.0 (*) 30.0 - 45.0 mmHg   Bicarbonate 32.4 (*) 20.0 - 24.0 mEq/L   TCO2 34  0 - 100 mmol/L   O2 Saturation 98.0     Acid-Base Excess 7.0 (*) 0.0 - 2.0 mmol/L   Sample type VENOUS    URINALYSIS, ROUTINE W REFLEX MICROSCOPIC     Status: Abnormal   Collection Time    03/10/13  1:19 PM      Result Value Range   Color, Urine YELLOW  YELLOW   APPearance CLEAR  CLEAR   Specific Gravity, Urine 1.010  1.005 - 1.030   pH 6.5  5.0 - 8.0   Glucose, UA 250 (*) NEGATIVE mg/dL   Hgb urine dipstick TRACE (*) NEGATIVE   Bilirubin Urine NEGATIVE  NEGATIVE   Ketones, ur NEGATIVE  NEGATIVE mg/dL   Protein, ur NEGATIVE  NEGATIVE mg/dL   Urobilinogen, UA 0.2  0.0 - 1.0 mg/dL   Nitrite NEGATIVE  NEGATIVE   Leukocytes, UA NEGATIVE  NEGATIVE  URINE MICROSCOPIC-ADD ON      Status: None  Collection Time    03/10/13  1:19 PM      Result Value Range   Squamous Epithelial / LPF RARE  RARE   WBC, UA 0-2  <3 WBC/hpf   RBC / HPF 3-6  <3 RBC/hpf    Dg Chest Port 1 View  03/10/2013   CLINICAL DATA:  Intubated  EXAM: PORTABLE CHEST - 1 VIEW  COMPARISON:  None.  FINDINGS: Tip of the endotracheal tube is just within the right mainstem bronchus. Recommend withdrawing 4 cm. There are diffuse bilateral interstitial and airspace opacities in predominantly perihilar and dependent distribution. The cardiopericardial silhouette is enlarged. The pulmonary vasculature is diffusely ill-defined consistent with edema. No acute osseous abnormality.  IMPRESSION: 1. The tip of the endotracheal tube is in the right mainstem bronchus. Recommend withdrawing 3-4 cm. This finding was called to the ER nurse taking care of the patient had noted that the tube has already been pulled back and a new chest x-ray is pending. 2. Diffuse bilateral interstitial and airspace opacities in a predominantly perihilar and bibasilar distribution. The imaging findings are most suggestive of acute pulmonary edema. Additional less likely differential considerations include multifocal pneumonia in diffuse alveolar hemorrhage. 3. Mild enlargement of the cardiopericardial silhouette.   Electronically Signed   By: Malachy Moan M.D.   On: 03/10/2013 12:12   2 D echo 09/2012 Study Conclusions  - Left ventricle: There is significant LVH. The cavity size is small. The EF is 70%. There is mild intracavitary gradient. Wall motion was normal; there were no regional wall motion abnormalities. - Aortic valve: The valve is calcified. There is aortic stenosis that is severe. I can not be sure if there is AI or not. Peak velocity: 436cm/s (S). Mean gradient: 50mm Hg (S). Peak gradient: 76mm Hg (S). - Left atrium: The atrium was moderately to severely dilated. - Impressions: Technically difficult study. I doubt  any significant change from July,2014. Impressions:  - Technically difficult study. I doubt any significant change from July,2014.  Review of Systems  Constitutional: Positive for fever.  HENT: Positive for congestion.   Respiratory: Positive for cough and shortness of breath.   Cardiovascular: Positive for orthopnea, leg swelling and PND. Negative for chest pain and palpitations.  Gastrointestinal: Positive for nausea, vomiting and abdominal pain. Negative for blood in stool and melena.  Genitourinary: Negative for hematuria.  Neurological: Positive for weakness. Negative for dizziness.  All other systems reviewed and are negative.   Blood pressure 94/37, pulse 104, temperature 100.4 F (38 C), temperature source Core (Comment), resp. rate 20, height 5\' 3"  (1.6 m), SpO2 91.00%. Physical Exam  Constitutional: She is oriented to person, place, and time. She appears well-developed. No distress.  Intubated, alert and answering questions.  Morbidly obese.   Appears comfortable.  HENT:  Head: Normocephalic and atraumatic.  Eyes: EOM are normal. Pupils are equal, round, and reactive to light. No scleral icterus.  Neck: Normal range of motion. Neck supple.  Cardiovascular: Normal rate and regular rhythm.   Murmur heard.  Systolic murmur is present with a grade of 2/6  Pulses:      Radial pulses are 2+ on the right side, and 2+ on the left side.       Dorsalis pedis pulses are 2+ on the right side, and 2+ on the left side.  Respiratory: She has rales.  On Vent.  + rhonchi.  GI: Bowel sounds are normal. There is tenderness (Across the epigastric region.).  Musculoskeletal: She exhibits edema.  Lymphadenopathy:    She has no cervical adenopathy.  Neurological: She is alert and oriented to person, place, and time. She exhibits normal muscle tone.  Skin: Skin is warm and dry.  Erhythema present in the lower extremities.  Psychiatric: She has a normal mood and affect.     Assessment/Plan:    Respiratory failure      The patient has now been extubated.    Aortic stenosis, severe,  peak velocities 436cm/s,  AVA 0.61cm^2.      It has previously been determined that the patient is not a candidate for aortic valve replacement or TAVR because of comorbidities.    COPD (chronic obstructive pulmonary disease)    Pulmonary edema, acute, with severe AS     The key issue at this time his volume overload. We are starting with IV twice a day Lasix. Once the patient's volume status is completely controlled plans can be made for discharge from the hospital.     Hypothyroidism   Anemia  Plan:  68 yo DNR female with end-stage aortic stenosis-followed by Hospice, presents with acute respiratory failure secondary to pulmonary edema.  She was intubated in the field.  She is alert and oriented.  Answers questions.  She was given 80mg  of IV lasix in the ER and continued BID.  Extubated in the ER.  Also being started on dopamine.   Will reassess and make adjustments as she diureses.  Hgb stable from last in December although during that stay she appears to have an acute drop.  Patient is febrile.  Recommend blood cultures.  HAGER, BRYAN 03/10/2013, 2:31 PM  Patient seen and examined. I agree with the assessment and plan as detailed above. See also my additional thoughts below.   I have reviewed all aspects of the data. My thoughts are included in the note above.  Willa RoughJeffrey Dyan Creelman, MD, [facility]ville[facility]ACC 03/10/2013 3:27 PM

## 2013-03-10 NOTE — ED Notes (Signed)
Placed size 14 french temp foley into patient clear yellow urine in creturn

## 2013-03-10 NOTE — Progress Notes (Signed)
Patient ID: Marvetta GibbonsStella Rosebrook, female   DOB: 1946/02/22, 68 y.o.   MRN: [id number] Tachy on dop Dc addd neo Need to establish goals ,Mcarthur RossettiDaniel J. Tyson AliasFeinstein, MD, FACP Pgr: [phone]  Pulmonary & Critical Care

## 2013-03-10 NOTE — Significant Event (Signed)
Called to bedside. [name] is wide awake and want OTT out and to full DNR as prior to admit. Order for extubation ordered. DNR ordered written. Brett CanalesSteve Charmeka [name] ACNP [facility] Pager [phone] till 3 pm If no answer page [phone] 03/10/2013, 1:58 PM

## 2013-03-10 NOTE — ED Notes (Signed)
MD at bedside. 

## 2013-03-11 ENCOUNTER — Encounter (HOSPITAL_COMMUNITY): Payer: Self-pay | Admitting: General Practice

## 2013-03-11 DIAGNOSIS — Z515 Encounter for palliative care: Secondary | ICD-10-CM

## 2013-03-11 LAB — BASIC METABOLIC PANEL
BUN: 17 mg/dL (ref 6–23)
BUN: 17 mg/dL (ref 6–23)
BUN: 17 mg/dL (ref 6–23)
CALCIUM: 8.2 mg/dL — AB (ref 8.4–10.5)
CHLORIDE: 89 meq/L — AB (ref 96–112)
CHLORIDE: 83 meq/L — AB (ref 96–112)
CO2: 30 mEq/L (ref 19–32)
CO2: 33 mEq/L — ABNORMAL HIGH (ref 19–32)
CO2: 35 meq/L — AB (ref 19–32)
Calcium: 7.9 mg/dL — ABNORMAL LOW (ref 8.4–10.5)
Calcium: 7.9 mg/dL — ABNORMAL LOW (ref 8.4–10.5)
Chloride: 89 mEq/L — ABNORMAL LOW (ref 96–112)
Creatinine, Ser: 1.14 mg/dL — ABNORMAL HIGH (ref 0.50–1.10)
Creatinine, Ser: 1.12 mg/dL — ABNORMAL HIGH (ref 0.50–1.10)
Creatinine, Ser: 1.15 mg/dL — ABNORMAL HIGH (ref 0.50–1.10)
GFR calc non Af Amer: 48 mL/min — ABNORMAL LOW (ref >90)
GFR, EST AFRICAN AMERICAN: 57 mL/min — AB (ref >90)
GFR, EST AFRICAN AMERICAN: 58 mL/min — AB (ref >90)
GFR, EST AFRICAN AMERICAN: 56 mL/min — AB (ref >90)
GFR, EST NON AFRICAN AMERICAN: 49 mL/min — AB (ref >90)
GFR, EST NON AFRICAN AMERICAN: 50 mL/min — AB (ref >90)
Glucose, Bld: 378 mg/dL — ABNORMAL HIGH (ref 70–99)
Glucose, Bld: 318 mg/dL — ABNORMAL HIGH (ref 70–99)
Glucose, Bld: 451 mg/dL — ABNORMAL HIGH (ref 70–99)
POTASSIUM: 2.5 meq/L — AB (ref 3.7–5.3)
Potassium: 2.5 mEq/L — CL (ref 3.7–5.3)
Potassium: 2.8 mEq/L — CL (ref 3.7–5.3)
SODIUM: 135 meq/L — AB (ref 137–147)
Sodium: 134 mEq/L — ABNORMAL LOW (ref 137–147)
Sodium: 132 mEq/L — ABNORMAL LOW (ref 137–147)

## 2013-03-11 LAB — CBC
HEMATOCRIT: 27.3 % — AB (ref 36.0–46.0)
Hemoglobin: 8 g/dL — ABNORMAL LOW (ref 12.0–15.0)
MCH: 22.7 pg — ABNORMAL LOW (ref 26.0–34.0)
MCHC: 29.3 g/dL — AB (ref 30.0–36.0)
MCV: 77.3 fL — ABNORMAL LOW (ref 78.0–100.0)
PLATELETS: DECREASED 10*3/uL (ref 150–400)
RBC: 3.53 MIL/uL — ABNORMAL LOW (ref 3.87–5.11)
RDW: 17.3 % — ABNORMAL HIGH (ref 11.5–15.5)
WBC: 10.6 10*3/uL — AB (ref 4.0–10.5)

## 2013-03-11 LAB — GLUCOSE, CAPILLARY
GLUCOSE-CAPILLARY: 413 mg/dL — AB (ref 70–99)
Glucose-Capillary: 339 mg/dL — ABNORMAL HIGH (ref 70–99)
Glucose-Capillary: 454 mg/dL — ABNORMAL HIGH (ref 70–99)
Glucose-Capillary: 497 mg/dL — ABNORMAL HIGH (ref 70–99)

## 2013-03-11 LAB — POTASSIUM: Potassium: 3.3 mEq/L — ABNORMAL LOW (ref 3.7–5.3)

## 2013-03-11 MED ORDER — POTASSIUM CHLORIDE CRYS ER 20 MEQ PO TBCR
40.0000 meq | EXTENDED_RELEASE_TABLET | Freq: Once | ORAL | Status: AC
  Administered 2013-03-11: 40 meq via ORAL
  Filled 2013-03-11: qty 2

## 2013-03-11 MED ORDER — INSULIN ASPART 100 UNIT/ML ~~LOC~~ SOLN
6.0000 [IU] | Freq: Three times a day (TID) | SUBCUTANEOUS | Status: DC
  Administered 2013-03-11 – 2013-03-15 (×11): 6 [IU] via SUBCUTANEOUS

## 2013-03-11 MED ORDER — INSULIN ASPART 100 UNIT/ML ~~LOC~~ SOLN
4.0000 [IU] | Freq: Three times a day (TID) | SUBCUTANEOUS | Status: DC
  Administered 2013-03-11: 4 [IU] via SUBCUTANEOUS

## 2013-03-11 MED ORDER — INSULIN GLARGINE 100 UNIT/ML ~~LOC~~ SOLN
40.0000 [IU] | Freq: Two times a day (BID) | SUBCUTANEOUS | Status: DC
  Administered 2013-03-11 – 2013-03-12 (×3): 40 [IU] via SUBCUTANEOUS
  Filled 2013-03-11 (×5): qty 0.4

## 2013-03-11 MED ORDER — INSULIN ASPART 100 UNIT/ML ~~LOC~~ SOLN
10.0000 [IU] | Freq: Once | SUBCUTANEOUS | Status: AC
  Administered 2013-03-11: 10 [IU] via SUBCUTANEOUS

## 2013-03-11 MED ORDER — DEXTROSE 5 % IV SOLN
30.0000 ug/min | INTRAVENOUS | Status: DC
  Administered 2013-03-11: 35 ug/min via INTRAVENOUS
  Filled 2013-03-11: qty 2

## 2013-03-11 MED ORDER — INSULIN ASPART 100 UNIT/ML ~~LOC~~ SOLN
0.0000 [IU] | Freq: Every day | SUBCUTANEOUS | Status: DC
Start: 2013-03-11 — End: 2013-03-15
  Administered 2013-03-12: 4 [IU] via SUBCUTANEOUS
  Administered 2013-03-13: 23:00:00 5 [IU] via SUBCUTANEOUS
  Administered 2013-03-14: 22:00:00 2 [IU] via SUBCUTANEOUS

## 2013-03-11 MED ORDER — ENOXAPARIN SODIUM 30 MG/0.3ML ~~LOC~~ SOLN
30.0000 mg | SUBCUTANEOUS | Status: DC
  Administered 2013-03-11 – 2013-03-13 (×3): 30 mg via SUBCUTANEOUS
  Filled 2013-03-11 (×3): qty 0.3

## 2013-03-11 MED ORDER — POTASSIUM CHLORIDE 10 MEQ/100ML IV SOLN
10.0000 meq | INTRAVENOUS | Status: AC
  Administered 2013-03-11 – 2013-03-12 (×4): 10 meq via INTRAVENOUS
  Filled 2013-03-11 (×4): qty 100

## 2013-03-11 MED ORDER — INSULIN ASPART 100 UNIT/ML ~~LOC~~ SOLN
0.0000 [IU] | Freq: Three times a day (TID) | SUBCUTANEOUS | Status: DC
  Administered 2013-03-11 – 2013-03-12 (×3): 15 [IU] via SUBCUTANEOUS
  Administered 2013-03-12: 19:00:00 11 [IU] via SUBCUTANEOUS
  Administered 2013-03-12: 8 [IU] via SUBCUTANEOUS
  Administered 2013-03-13: 15 [IU] via SUBCUTANEOUS
  Administered 2013-03-13 (×2): 8 [IU] via SUBCUTANEOUS
  Administered 2013-03-14: 15 [IU] via SUBCUTANEOUS
  Administered 2013-03-14 – 2013-03-15 (×3): 8 [IU] via SUBCUTANEOUS

## 2013-03-11 NOTE — Progress Notes (Signed)
Patient ID: [name] GibbonsStella [name], female   DOB: 30-Mar-1945, 68 y.o.   MRN: [id number]    Subjective:  Denies SSCP, palpitations or Dyspnea Wants to eat   Objective:  Filed Vitals:   03/11/13 0400 03/11/13 0500 03/11/13 0600 03/11/13 0700  BP: 113/47 98/30 96/26  97/36  Pulse: 104 98 96 100  Temp: 98.7 F (37.1 C)     TempSrc: Oral     Resp: 18 21 32 18  Height:      Weight:  252 lb 6.8 oz (114.5 kg)    SpO2: 98% 99% 99% 98%    Intake/Output from previous day:  Intake/Output Summary (Last 24 hours) at 03/11/13 0816 Last data filed at 03/11/13 0700  Gross per 24 hour  Intake 1211.5 ml  Output   5450 ml  Net -4238.5 ml    Physical Exam: Affect appropriate Chronically ill obese female  HEENT: normal Neck supple with no adenopathy JVP normal no bruits no thyromegaly Lungs clear with no wheezing and good diaphragmatic motion Heart:  S1/S2 muffled severe AS  murmur, no rub, gallop or click PMI normal Abdomen: benighn, BS positve, no tenderness, no AAA no bruit.  No HSM or HJR Distal pulses intact with no bruits Plus 2 edema Neuro non-focal Skin warm and dry Marked weakness LE;s    Lab Results: Basic Metabolic Panel:  Recent Labs  40/98/1101/15/15 1132 03/10/13 1253 03/10/13 2305 03/11/13 0323  NA 135*  --  135* 134*  K 2.5*  --  2.5* 2.5*  CL 92*  --  89* 89*  CO2 29  --  30 33*  GLUCOSE 333*  --  378* 318*  BUN 19  --  17 17  CREATININE 1.26*  --  1.14* 1.12*  CALCIUM 7.8*  --  7.9* 7.9*  MG  --  1.9  --   --   PHOS  --  3.1  --   --    Liver Function Tests:  Recent Labs  03/10/13 1132  AST 34  ALT 18  ALKPHOS 147*  BILITOT 1.0  PROT 6.5  ALBUMIN 2.4*   No results found for this basename: LIPASE, AMYLASE,  in the last 72 hours CBC:  Recent Labs  03/10/13 1132 03/11/13 0323  WBC 10.3 10.6*  NEUTROABS 8.5*  --   HGB 8.2* 8.0*  HCT 27.7* 27.3*  MCV 78.7 77.3*  PLT 70* PLATELET CLUMPS NOTED ON SMEAR, COUNT APPEARS DECREASED   Cardiac  Enzymes:  Recent Labs  03/10/13 1133  TROPONINI <0.30    Imaging: Dg Chest Port 1 View  03/10/2013   CLINICAL DATA:  Intubated  EXAM: PORTABLE CHEST - 1 VIEW  COMPARISON:  None.  FINDINGS: Tip of the endotracheal tube is just within the right mainstem bronchus. Recommend withdrawing 4 cm. There are diffuse bilateral interstitial and airspace opacities in predominantly perihilar and dependent distribution. The cardiopericardial silhouette is enlarged. The pulmonary vasculature is diffusely ill-defined consistent with edema. No acute osseous abnormality.  IMPRESSION: 1. The tip of the endotracheal tube is in the right mainstem bronchus. Recommend withdrawing 3-4 cm. This finding was called to the ER nurse taking care of the patient had noted that the tube has already been pulled back and a new chest x-ray is pending. 2. Diffuse bilateral interstitial and airspace opacities in a predominantly perihilar and bibasilar distribution. The imaging findings are most suggestive of acute pulmonary edema. Additional less likely differential considerations include multifocal pneumonia in diffuse alveolar hemorrhage. 3. Mild enlargement of the  cardiopericardial silhouette.   Electronically Signed   By: Malachy Moan M.D.   On: 03/10/2013 12:12    Cardiac Studies:  ECG:  None in Epic ? Needs chart merger   Telemetry:  SR rates 90-105  PVC  Echo:   Medications:   . carvedilol  12.5 mg Oral BID WC  . ciprofloxacin  500 mg Oral BID  . famotidine  20 mg Oral BID  . furosemide  80 mg Intravenous Q12H  . heparin  5,000 Units Subcutaneous Q8H  . hydrocerin  1 application Topical Daily  . insulin aspart  6-14 Units Subcutaneous TID WC & HS  . insulin glargine  35 Units Subcutaneous BID  . ipratropium-albuterol  3 mL Nebulization BID  . levothyroxine  137 mcg Oral QAC breakfast  . metolazone  2.5 mg Oral 2 times weekly  . midazolam  5 mg Intravenous Once  . OxyCODONE  20 mg Oral Q12H  . pantoprazole  (PROTONIX) IV  40 mg Intravenous QHS  . potassium chloride SA  60 mEq Oral BID  . torsemide  40 mg Oral BID WC     . sodium chloride 10 mL/hr at 03/10/13 1600  . phenylephrine (NEO-SYNEPHRINE) Adult infusion 35 mcg/min (03/11/13 0758)    Assessment/Plan:  CHF:  Continue diuresis pulmonary status improved.  Supple K follow CXR Not to be intubated again AS:  Severe  She has been evaluated multiple times and is not a candidate for TAVR or AVR ( unable to walk,  Morbid obesity, respitory failure with C02 rention and encephalopathy)  Goal is for medical Rx of CHF and return home with hospice services    Charlton Haws 03/11/2013, 8:16 AM

## 2013-03-11 NOTE — Progress Notes (Signed)
eLink Physician-Brief Progress Note Patient Name: [name] GibbonsStella [name] DOB: 04-Dec-1945 MRN: [id number]  Date of Service  03/11/2013   HPI/Events of Note  Hypokalemia  eICU Interventions  Potassium replaced   Intervention Category Intermediate Interventions: Electrolyte abnormality - evaluation and management  Trajon Rosete 03/11/2013, 12:35 AM

## 2013-03-11 NOTE — Care Management Note (Signed)
    Page 1 of 1   03/11/2013     10:29:09 AM   CARE MANAGEMENT NOTE 03/11/2013  Patient:  [name],[name]   Account Number:  [id number]  Date Initiated:  03/10/2013  Documentation initiated by:  Junius CreamerWELL,DEBBIE  Subjective/Objective Assessment:   adm w shortness of breath     Action/Plan:   lives w husband, pcp dr Marletta Lorjulie barr, act w [facility]   Anticipated DC Date:     Anticipated DC Plan:  HOME W HOSPICE CARE      DC Planning Services  CM consult      [facility]  Resumption Of Svcs/PTA Provider   Choice offered to / List presented to:          [facility]  HH-1 RN  HH-6 SOCIAL WORKER      HH agency  HOSPICE   Status of service:   Medicare Important Message given?   (If response is "NO", the following Medicare IM given date fields will be blank) Date Medicare IM given:   Date Additional Medicare IM given:    Discharge Disposition:    Per UR Regulation:  Reviewed for med. necessity/level of care/duration of stay  If discussed at Long Length of Stay Meetings, dates discussed:    Comments:  1/16 1028a debbie Lasya Vetter rn,bsn pt act w [facility].

## 2013-03-11 NOTE — Progress Notes (Signed)
eLink Physician-Brief Progress Note Patient Name: [name] GibbonsStella Bertone DOB: [birth year] MRN: [id number]  Date of Service  03/11/2013   HPI/Events of Note     eICU Interventions  Hypokalemia, repleted    Intervention Category Minor Interventions: Electrolytes abnormality - evaluation and management  MCQUAID, DOUGLAS 03/11/2013, 9:31 PM

## 2013-03-11 NOTE — Progress Notes (Addendum)
Dr Delford FieldWright called in box about last two CBGs being over 450. MD states he will change coverage.

## 2013-03-11 NOTE — Progress Notes (Signed)
PT refused ABG

## 2013-03-11 NOTE — Progress Notes (Signed)
CRITICAL VALUE ALERT  Critical value received:  k 2.8  Date of notification: 03-11-2013  Time of notification:  2130  Critical value read back:yes  Nurse who received alert:  Neville RouteAllison Horton, RN  MD notified (1st page):  McQuaid   Time of first page:  2130  Responding MD: Kendrick FriesMcQuaid  Time MD responded:  2130

## 2013-03-11 NOTE — Progress Notes (Addendum)
    Name: [name] GibbonsStella Shutes MRN: [id number] DOB: 07/04/1945    ADMISSION DATE:  03/10/2013  REFERRING MD :  EDP PRIMARY SERVICE: PCCM  CHIEF COMPLAINT:  Resp failure  BRIEF PATIENT DESCRIPTION:  68 yo female with end stage aortic valve disease, DNR who was intubated in field despite DNR. [facility] and PCCM called to admit.  SIGNIFICANT EVENTS / STUDIES:  1-15 intubated, then extubated. Admitted to CCU 1/16 Awake, alert, no distress, no complaint. Remains on low dose phenylephrine to maintain MAP > 65 mmHg.   LINES / TUBES: 1-15 ETT >> 1/15  CULTURES:   ANTIBIOTICS: Cipro 1/15 >> 1/16  SUBJECTIVE:  Awake, alert, no distress, no complaint. Remains on low dose phenylephrine to maintain MAP > 65 mmHg  VITAL SIGNS: Temp:  [98.7 F (37.1 C)-100.4 F (38 C)] 98.7 F (37.1 C) (01/16 0400) Pulse Rate:  [96-127] 103 (01/16 0800) Resp:  [16-38] 16 (01/16 0800) BP: (77-119)/(26-53) 113/53 mmHg (01/16 0800) SpO2:  [91 %-100 %] 100 % (01/16 1003) FiO2 (%):  [100 %] 100 % (01/15 1143) Weight:  [114.5 kg (252 lb 6.8 oz)-117.3 kg (258 lb 9.6 oz)] 114.5 kg (252 lb 6.8 oz) (01/16 0500) HEMODYNAMICS:   VENTILATOR SETTINGS: Vent Mode:  [-] PRVC FiO2 (%):  [100 %] 100 % Set Rate:  [14 bmp] 14 bmp Vt Set:  [420 mL] 420 mL PEEP:  [5 cmH20] 5 cmH20 Plateau Pressure:  [26 cmH20] 26 cmH20 INTAKE / OUTPUT: Intake/Output     01/15 0701 - 01/16 0700 01/16 0701 - 01/17 0700   P.O. 480 60   I.V. (mL/kg) 731.5 (6.4) 26.3 (0.2)   Total Intake(mL/kg) 1211.5 (10.6) 86.3 (0.8)   Urine (mL/kg/hr) 5450    Total Output 5450     Net -4238.5 +86.3          PHYSICAL EXAMINATION: General: NAD Neuro: No focal deficits HEENT:  WNL Cardiovascular: Regular, + syst M Lungs:  Bibasilar crackles Abdomen: Obese + bs Ext: symmetric LE edema   LABS: I have reviewed all of today's lab results. Relevant abnormalities are discussed in the A/P section  No new CXR  ASSESSMENT Acute resp  failure due to acute pulmonary edema Severe aortic stenosis - not a surgical candidate Cardiogenic shock, resolving Hypokalemia DM II Hospice patient with well defined DNR/I status prior to admission  PLAN Wean PE to off. Change MAP goal to 55 mmHg Replete K+ Change SSI to ACHS Cont rest as is - all orders reviewed carefully Full comfort if resp distress recurs Anticipate DC home 1/17 AM Discussed with Dr Courtney HeysNishan   Lino Wickliff, MD ; [facility] service Mobile [phone].  After 5:30 PM or weekends, call [phone]

## 2013-03-11 NOTE — Progress Notes (Addendum)
Rm 2H06 [name]-Debora Galli- [facility] Visit-M.Konrad DoloresLester, RN  [facility]: AV disorder; Pt is DNR OOF DNR and home medication list on chart Pt seen at bedside sitting up recently finished breakfast; husband at bedside; pt stated she feels better this morning "Lord I didn't think I was going to make it yesterday"; Pt and husband stated their understanding is the doctors atre trying to get some of the fluid off, adjust her medicines and get her potassium up before she is ready to go home; plan is to return home with [facility] following when stable.  Please call [facility] @ [phone] for RN Liaison or after hours ask for on-call RN with any hospice needs. Thank you Valente DavidMargie Caretha Rumbaugh, RN Hospice Liaison [phone]

## 2013-03-11 NOTE — Progress Notes (Signed)
eLink Nursing ICU Electrolyte Replacement Protocol  Patient Name: [name] GibbonsStella [name] DOB: 1945/10/28 MRN: [id number]  Date of Service  03/11/2013   HPI/Events of Note    Recent Labs Lab 03/10/13 1132 03/10/13 1253 03/10/13 2305 03/11/13 0323  NA 135*  --  135* 134*  K 2.5*  --  2.5* 2.5*  CL 92*  --  89* 89*  CO2 29  --  30 33*  GLUCOSE 333*  --  378* 318*  BUN 19  --  17 17  CREATININE 1.26*  --  1.14* 1.12*  CALCIUM 7.8*  --  7.9* 7.9*  MG  --  1.9  --   --   PHOS  --  3.1  --   --     Estimated Creatinine Clearance: 61.2 ml/min (by C-G formula based on Cr of 1.12).  Intake/Output     01/15 0701 - 01/16 0700   P.O. 480   I.V. (mL/kg) 626.5 (5.3)   Total Intake(mL/kg) 1106.5 (9.4)   Urine (mL/kg/hr) 5450   Total Output 5450   Net -4343.5        - I/O DETAILED x24h    Total I/O In: 1106.5 [P.O.:480; I.V.:626.5] Out: 4050 [Urine:4050] - I/O THIS SHIFT    ASSESSMENT   eICURN Interventions  K+ 2.5 Replaced by MD   ASSESSMENT: MAJOR ELECTROLYTE    [name] NortonFrye, [name] 03/11/2013, 6:04 AM

## 2013-03-12 DIAGNOSIS — Z515 Encounter for palliative care: Secondary | ICD-10-CM

## 2013-03-12 LAB — BASIC METABOLIC PANEL
BUN: 18 mg/dL (ref 6–23)
BUN: 18 mg/dL (ref 6–23)
BUN: 23 mg/dL (ref 6–23)
CHLORIDE: 85 meq/L — AB (ref 96–112)
CO2: 38 mEq/L — ABNORMAL HIGH (ref 19–32)
CO2: 38 meq/L — AB (ref 19–32)
CO2: 35 meq/L — AB (ref 19–32)
CREATININE: 1.11 mg/dL — AB (ref 0.50–1.10)
Calcium: 8.4 mg/dL (ref 8.4–10.5)
Calcium: 8.3 mg/dL — ABNORMAL LOW (ref 8.4–10.5)
Calcium: 8.2 mg/dL — ABNORMAL LOW (ref 8.4–10.5)
Chloride: 87 mEq/L — ABNORMAL LOW (ref 96–112)
Chloride: 86 mEq/L — ABNORMAL LOW (ref 96–112)
Creatinine, Ser: 1.32 mg/dL — ABNORMAL HIGH (ref 0.50–1.10)
Creatinine, Ser: 1.43 mg/dL — ABNORMAL HIGH (ref 0.50–1.10)
GFR calc Af Amer: 59 mL/min — ABNORMAL LOW (ref >90)
GFR calc Af Amer: 48 mL/min — ABNORMAL LOW (ref >90)
GFR calc Af Amer: 43 mL/min — ABNORMAL LOW (ref >90)
GFR calc non Af Amer: 41 mL/min — ABNORMAL LOW (ref >90)
GFR calc non Af Amer: 37 mL/min — ABNORMAL LOW (ref >90)
GFR, EST NON AFRICAN AMERICAN: 51 mL/min — AB (ref >90)
GLUCOSE: 351 mg/dL — AB (ref 70–99)
GLUCOSE: 379 mg/dL — AB (ref 70–99)
Glucose, Bld: 367 mg/dL — ABNORMAL HIGH (ref 70–99)
POTASSIUM: 2.6 meq/L — AB (ref 3.7–5.3)
POTASSIUM: 3.1 meq/L — AB (ref 3.7–5.3)
Potassium: 2.8 mEq/L — CL (ref 3.7–5.3)
SODIUM: 133 meq/L — AB (ref 137–147)
Sodium: 137 mEq/L (ref 137–147)
Sodium: 136 mEq/L — ABNORMAL LOW (ref 137–147)

## 2013-03-12 LAB — HEMOGLOBIN A1C
Hgb A1c MFr Bld: 12.8 % — ABNORMAL HIGH (ref <5.7)
MEAN PLASMA GLUCOSE: 321 mg/dL — AB (ref <117)

## 2013-03-12 LAB — GLUCOSE, CAPILLARY
GLUCOSE-CAPILLARY: 377 mg/dL — AB (ref 70–99)
GLUCOSE-CAPILLARY: 381 mg/dL — AB (ref 70–99)
GLUCOSE-CAPILLARY: 320 mg/dL — AB (ref 70–99)
Glucose-Capillary: 273 mg/dL — ABNORMAL HIGH (ref 70–99)
Glucose-Capillary: 385 mg/dL — ABNORMAL HIGH (ref 70–99)

## 2013-03-12 MED ORDER — POTASSIUM CHLORIDE CRYS ER 20 MEQ PO TBCR
60.0000 meq | EXTENDED_RELEASE_TABLET | Freq: Once | ORAL | Status: AC
  Administered 2013-03-12: 60 meq via ORAL

## 2013-03-12 MED ORDER — ONDANSETRON HCL 4 MG PO TABS
4.0000 mg | ORAL_TABLET | Freq: Three times a day (TID) | ORAL | Status: DC | PRN
  Administered 2013-03-12: 4 mg via ORAL
  Filled 2013-03-12: qty 1

## 2013-03-12 MED ORDER — POTASSIUM CHLORIDE CRYS ER 20 MEQ PO TBCR: 40.0000 meq | EXTENDED_RELEASE_TABLET | Freq: Once | ORAL | Status: DC

## 2013-03-12 MED ORDER — POTASSIUM CHLORIDE CRYS ER 20 MEQ PO TBCR
40.0000 meq | EXTENDED_RELEASE_TABLET | Freq: Once | ORAL | Status: AC
  Administered 2013-03-12: 40 meq via ORAL

## 2013-03-12 MED ORDER — POTASSIUM CHLORIDE CRYS ER 20 MEQ PO TBCR
60.0000 meq | EXTENDED_RELEASE_TABLET | Freq: Three times a day (TID) | ORAL | Status: DC
  Administered 2013-03-12 – 2013-03-15 (×8): 60 meq via ORAL
  Filled 2013-03-12 (×10): qty 3

## 2013-03-12 MED ORDER — MORPHINE SULFATE (CONCENTRATE) 10 MG /0.5 ML PO SOLN: 10.0000 mg | ORAL | Status: DC | PRN

## 2013-03-12 MED ORDER — FUROSEMIDE 40 MG PO TABS
40.0000 mg | ORAL_TABLET | Freq: Two times a day (BID) | ORAL | Status: DC
  Administered 2013-03-12: 40 mg via ORAL
  Filled 2013-03-12 (×4): qty 1

## 2013-03-12 MED ORDER — POTASSIUM CHLORIDE CRYS ER 20 MEQ PO TBCR: 40.0000 meq | EXTENDED_RELEASE_TABLET | Freq: Two times a day (BID) | ORAL | Status: DC

## 2013-03-12 MED ORDER — IPRATROPIUM-ALBUTEROL 0.5-2.5 (3) MG/3ML IN SOLN: 3.0000 mL | RESPIRATORY_TRACT | Status: DC | PRN

## 2013-03-12 NOTE — Progress Notes (Signed)
eLink Physician-Brief Progress Note Patient Name: [name] GibbonsStella Violett DOB: 1945-03-12 MRN: [id number]  Date of Service  03/12/2013   HPI/Events of Note     eICU Interventions  Hypokalemia, repleted 60mEq now and again bid today   Intervention Category Minor Interventions: Electrolytes abnormality - evaluation and management  MCQUAID, DOUGLAS 03/12/2013, 5:27 AM

## 2013-03-12 NOTE — Progress Notes (Signed)
[place] PCCM    Name: [name] GibbonsStella [name] MRN: [id number] DOB: 07/27/1945    ADMISSION DATE:  03/10/2013  REFERRING MD :  EDP PRIMARY SERVICE: PCCM  CHIEF COMPLAINT:  Resp failure  BRIEF PATIENT DESCRIPTION:  68 yo female with end stage aortic valve disease, DNR who was intubated in field despite DNR. [facility] and PCCM called to admit.  SIGNIFICANT EVENTS / STUDIES:  1-15 intubated, then extubated per pt wishes. Admitted to CCU 1/16 Awake, alert, no distress, no complaint. Remains on low dose phenylephrine to maintain MAP > 65 mmHg. 1/17 off neo, sig hypokalemia, tx floor   LINES / TUBES: 1-15 ETT >> 1/15  CULTURES:   ANTIBIOTICS: Cipro 1/15 >> 1/16  SUBJECTIVE:  Awake, alert, no distress, no complaint. Significant hypokalemia. Phenylephrine off overnight.    VITAL SIGNS: Temp:  [97.4 F (36.3 C)-99.2 F (37.3 C)] 98.7 F (37.1 C) (01/17 0800) Pulse Rate:  [98-117] 111 (01/17 0800) Resp:  [13-19] 13 (01/17 0800) BP: (94-124)/(16-59) 105/34 mmHg (01/17 0800) SpO2:  [90 %-100 %] 92 % (01/17 0800) Weight:  [240 lb 8.4 oz (109.1 kg)] 240 lb 8.4 oz (109.1 kg) (01/17 0500) HEMODYNAMICS:   VENTILATOR SETTINGS:   INTAKE / OUTPUT: Intake/Output     01/16 0701 - 01/17 0700 01/17 0701 - 01/18 0700   P.O. 2460 240   I.V. (mL/kg) 171.6 (1.6)    IV Piggyback 400    Total Intake(mL/kg) 3031.6 (27.8) 240 (2.2)   Urine (mL/kg/hr) 10000 (3.8) 250 (1.3)   Total Output [id number] 250   Net -6968.4 -10          PHYSICAL EXAMINATION: General: NAD Neuro: No focal deficits HEENT:  WNL Cardiovascular: Regular, + syst M Lungs:  resps even non labored on [place], Bibasilar crackles Abdomen: Obese + bs Ext: symmetric LE edema   LABS:  Recent Labs Lab 03/10/13 1132 03/11/13 0323  HGB 8.2* 8.0*  HCT 27.7* 27.3*  WBC 10.3 10.6*  PLT 70* PLATELET CLUMPS NOTED ON SMEAR, COUNT APPEARS DECREASED    Recent Labs Lab 03/10/13 1132 03/10/13 1253 03/10/13 2305 03/11/13 0323   03/11/13 2030 03/12/13 0335  NA 135*  --  135* 134*  --  132* 137  K 2.5*  --  2.5* 2.5*  < > 2.8* 2.6*  CL 92*  --  89* 89*  --  83* 87*  CO2 29  --  30 33*  --  35* 38*  GLUCOSE 333*  --  378* 318*  --  451* 351*  BUN 19  --  17 17  --  17 18  CREATININE 1.26*  --  1.14* 1.12*  --  1.15* 1.11*  CALCIUM 7.8*  --  7.9* 7.9*  --  8.2* 8.4  MG  --  1.9  --   --   --   --   --   PHOS  --  3.1  --   --   --   --   --   < > = values in this interval not displayed.   Recent Labs Lab 03/10/13 1133  TROPONINI <0.30    Recent Labs Lab 03/10/13 1132  AST 34  ALT 18  ALKPHOS 147*  BILITOT 1.0  PROT 6.5  ALBUMIN 2.4*  INR 1.24    Dg Chest Port 1 View  03/10/2013   CLINICAL DATA:  Intubated  EXAM: PORTABLE CHEST - 1 VIEW  COMPARISON:  None.  FINDINGS: Tip of the endotracheal tube is just  within the right mainstem bronchus. Recommend withdrawing 4 cm. There are diffuse bilateral interstitial and airspace opacities in predominantly perihilar and dependent distribution. The cardiopericardial silhouette is enlarged. The pulmonary vasculature is diffusely ill-defined consistent with edema. No acute osseous abnormality.  IMPRESSION: 1. The tip of the endotracheal tube is in the right mainstem bronchus. Recommend withdrawing 3-4 cm. This finding was called to the ER nurse taking care of the patient had noted that the tube has already been pulled back and a new chest x-ray is pending. 2. Diffuse bilateral interstitial and airspace opacities in a predominantly perihilar and bibasilar distribution. The imaging findings are most suggestive of acute pulmonary edema. Additional less likely differential considerations include multifocal pneumonia in diffuse alveolar hemorrhage. 3. Mild enlargement of the cardiopericardial silhouette.   Electronically Signed   By: Malachy Moan M.D.   On: 03/10/2013 12:12     ASSESSMENT Acute resp failure due to acute pulmonary edema Severe aortic stenosis - not a  surgical candidate Cardiogenic shock, resolving Hypokalemia DM II Hospice patient with well defined DNR/DNI status prior to admission   PLAN Monitor BP off neo gtt Replete K+ -- will add scheduled K to pair with lasix  Close f/u bmet  Cont SSI, increase lantus 1/17 Decrease lasix to 40mg  PO BID-- net NEG >7L Cont zaroxolyn, demadex Full comfort if resp distress recurs Anticipate DC home 1/18 if remains off neo, able to correct K - Hospice already arranged at home  Tx pall care bed, non tele ok Bonnita Nasuti, NP 03/12/2013  8:49 AM Pager: [phone] or [phone]  *Care during the described time interval was provided by me and/or other providers on the critical care team. I have reviewed this patient's available data, including medical history, events of note, physical examination and test results as part of my evaluation.   Dorcas Carrow Beeper  [phone]  Cell  [phone]  If no response or cell goes to voicemail, call beeper [phone]

## 2013-03-12 NOTE — Progress Notes (Signed)
Name: [name] GibbonsStella Koestner MRN: [id number] DOB: 09-20-1945  ELECTRONIC ICU PHYSICIAN NOTE  Problem:  Hypokalemia   Recent Labs Lab 03/12/13 0335 03/12/13 1149 03/12/13 2029  NA 137 136* 133*  K 2.6* 3.1* 2.8*  CL 87* 86* 85*  CO2 38* 38* 35*  BUN 18 18 23   CREATININE 1.11* 1.32* 1.43*  GLUCOSE 351* 379* 367*      Intervention:   kdur 40 x 2  Sandrea HughsMichael Witney Huie 03/12/2013, 9:46 PM

## 2013-03-12 NOTE — Progress Notes (Signed)
Subjective:  Hypotensive and weak, but breathing relatively comfortably O2 sat 99% with O2 supplement, 90% RA after bath  Objective:  Temp:  [97.4 F (36.3 C)-99.2 F (37.3 C)] 98.7 F (37.1 C) (01/17 0800) Pulse Rate:  [98-117] 111 (01/17 0800) Resp:  [13-19] 13 (01/17 0800) BP: (94-124)/(16-59) 105/34 mmHg (01/17 0800) SpO2:  [90 %-100 %] 100 % (01/17 0900) Weight:  [109.1 kg (240 lb 8.4 oz)] 109.1 kg (240 lb 8.4 oz) (01/17 0500) Weight change: -8.2 kg (-18 lb 1.2 oz)  Intake/Output from previous day: 01/16 0701 - 01/17 0700 In: 3031.6 [P.O.:2460; I.V.:171.6; IV Piggyback:400] Out: 10000 [Urine:10000]  Intake/Output from this shift: Total I/O In: 240 [P.O.:240] Out: 250 [Urine:250]  Medications: Current Facility-Administered Medications  Medication Dose Route Frequency Provider Last Rate Last Dose  . 0.9 %  sodium chloride infusion   Intravenous Continuous Grace Bushy Minor, NP 20 mL/hr at 03/11/13 2212    . acetaminophen (TYLENOL) tablet 650 mg  650 mg Oral Q4H PRN Juanito Doom, MD      . ALPRAZolam Duanne Moron) tablet 1 mg  1 mg Oral TID PRN Juanito Doom, MD      . bisacodyl (DULCOLAX) suppository 10 mg  10 mg Rectal Daily PRN Juanito Doom, MD      . carvedilol (COREG) tablet 12.5 mg  12.5 mg Oral BID WC Juanito Doom, MD   12.5 mg at 03/12/13 0817  . enoxaparin (LOVENOX) injection 30 mg  30 mg Subcutaneous Q24H Wilhelmina Mcardle, MD   30 mg at 03/12/13 0951  . famotidine (PEPCID) tablet 20 mg  20 mg Oral BID Juanito Doom, MD   20 mg at 03/12/13 0951  . fentaNYL (SUBLIMAZE) injection 25-100 mcg  25-100 mcg Intravenous Q1H PRN Grace Bushy Minor, NP   100 mcg at 03/10/13 1405  . furosemide (LASIX) tablet 40 mg  40 mg Oral BID Marijean Heath, NP      . hydrocerin (EUCERIN) cream 1 application  1 application Topical Daily Juanito Doom, MD   1 application at 00/93/81 [phone]  . insulin aspart (novoLOG) injection 0-15 Units  0-15 Units Subcutaneous TID  WC Wilhelmina Mcardle, MD   8 Units at 03/12/13 0818  . insulin aspart (novoLOG) injection 0-5 Units  0-5 Units Subcutaneous QHS Wilhelmina Mcardle, MD      . insulin aspart (novoLOG) injection 6 Units  6 Units Subcutaneous TID WC Elsie Stain, MD   6 Units at 03/12/13 0818  . insulin glargine (LANTUS) injection 40 Units  40 Units Subcutaneous BID Elsie Stain, MD   40 Units at 03/12/13 [phone]  . ipratropium-albuterol (DUONEB) 0.5-2.5 (3) MG/3ML nebulizer solution 3 mL  3 mL Nebulization BID Juanito Doom, MD   3 mL at 03/12/13 0900  . levothyroxine (SYNTHROID, LEVOTHROID) tablet 137 mcg  137 mcg Oral QAC breakfast Juanito Doom, MD   137 mcg at 03/12/13 0818  . magnesium hydroxide (MILK OF MAGNESIA) suspension 30 mL  30 mL Oral Daily PRN Juanito Doom, MD      . metolazone (ZAROXOLYN) tablet 2.5 mg  2.5 mg Oral 2 times weekly Juanito Doom, MD   2.5 mg at 03/10/13 2108  . midazolam (VERSED) injection 5 mg  5 mg Intravenous Once Elmer Sow, MD      . nitroGLYCERIN (NITROSTAT) SL tablet 0.4 mg  0.4 mg Sublingual Q5 min PRN Juanito Doom, MD      .  oxyCODONE (Oxy IR/ROXICODONE) immediate release tablet 5 mg  5 mg Oral Q6H PRN Juanito Doom, MD   5 mg at 03/11/13 2031  . OxyCODONE (OXYCONTIN) 12 hr tablet 20 mg  20 mg Oral Q12H Juanito Doom, MD   20 mg at 03/12/13 3154  . pantoprazole (PROTONIX) injection 40 mg  40 mg Intravenous QHS Grace Bushy Minor, NP   40 mg at 03/11/13 2204  . potassium chloride SA (K-DUR,KLOR-CON) CR tablet 60 mEq  60 mEq Oral BID Juanito Doom, MD   60 mEq at 03/12/13 0086  . prochlorperazine (COMPAZINE) tablet 10 mg  10 mg Oral Q6H PRN Juanito Doom, MD      . torsemide [facility]) tablet 40 mg  40 mg Oral BID WC Juanito Doom, MD   40 mg at 03/12/13 0818    Physical Exam: General appearance: alert, cooperative and no distress Neck: JVD - 8 cm above sternal notch, no adenopathy, supple, symmetrical, trachea midline, thyroid not  enlarged, symmetric, no tenderness/mass/nodules and bilateral carotid bruits, weak carotid upstroke Lungs: clear to auscultation bilaterally Heart: regular rate and rhythm, S1: normal, S2: decreased intensity and systolic murmur: systolic ejection 3/6, late peaking throughout the precordium Abdomen: soft, non-tender; bowel sounds normal; no masses,  no organomegaly Extremities: edema brawny, R>L Pulses: 2+ and symmetric thready Skin: Skin color, texture, turgor normal. No rashes or lesions Neurologic: Grossly normal  Lab Results: Results for orders placed during the hospital encounter of 03/10/13 (from the past 48 hour(s))  CBC WITH DIFFERENTIAL     Status: Abnormal   Collection Time    03/10/13 11:32 AM      Result Value Range   WBC 10.3  4.0 - 10.5 K/uL   RBC 3.52 (*) 3.87 - 5.11 MIL/uL   Hemoglobin 8.2 (*) 12.0 - 15.0 g/dL   HCT 27.7 (*) 36.0 - 46.0 %   MCV 78.7  78.0 - 100.0 fL   MCH 23.3 (*) 26.0 - 34.0 pg   MCHC 29.6 (*) 30.0 - 36.0 g/dL   RDW 17.1 (*) 11.5 - 15.5 %   Platelets 70 (*) 150 - 400 K/uL   Comment: PLATELET COUNT CONFIRMED BY SMEAR     REPEATED TO VERIFY   Neutrophils Relative % 83 (*) 43 - 77 %   Neutro Abs 8.5 (*) 1.7 - 7.7 K/uL   Lymphocytes Relative 10 (*) 12 - 46 %   Lymphs Abs 1.0  0.7 - 4.0 K/uL   Monocytes Relative 7  3 - 12 %   Monocytes Absolute 0.7  0.1 - 1.0 K/uL   Eosinophils Relative 0  0 - 5 %   Eosinophils Absolute 0.0  0.0 - 0.7 K/uL   Basophils Relative 0  0 - 1 %   Basophils Absolute 0.0  0.0 - 0.1 K/uL  COMPREHENSIVE METABOLIC PANEL     Status: Abnormal   Collection Time    03/10/13 11:32 AM      Result Value Range   Sodium 135 (*) 137 - 147 mEq/L   Potassium 2.5 (*) 3.7 - 5.3 mEq/L   Comment: CRITICAL RESULT CALLED TO, READ BACK BY AND VERIFIED WITH:     NORMAN,B RN @ 1515 03/10/13 LEONARD,A   Chloride 92 (*) 96 - 112 mEq/L   CO2 29  19 - 32 mEq/L   Glucose, Bld 333 (*) 70 - 99 mg/dL   BUN 19  6 - 23 mg/dL   Creatinine, Ser 1.26  (*) 0.50 -  1.10 mg/dL   Calcium 7.8 (*) 8.4 - 10.5 mg/dL   Total Protein 6.5  6.0 - 8.3 g/dL   Albumin 2.4 (*) 3.5 - 5.2 g/dL   AST 34  0 - 37 U/L   ALT 18  0 - 35 U/L   Alkaline Phosphatase 147 (*) 39 - 117 U/L   Total Bilirubin 1.0  0.3 - 1.2 mg/dL   GFR calc non Af Amer 43 (*) >90 mL/min   GFR calc Af Amer 50 (*) >90 mL/min   Comment: (NOTE)     The eGFR has been calculated using the CKD EPI equation.     This calculation has not been validated in all clinical situations.     eGFR's persistently <90 mL/min signify possible Chronic Kidney     Disease.  PROTIME-INR     Status: Abnormal   Collection Time    03/10/13 11:32 AM      Result Value Range   Prothrombin Time 15.3 (*) 11.6 - 15.2 seconds   INR 1.24  0.00 - 1.49  APTT     Status: None   Collection Time    03/10/13 11:32 AM      Result Value Range   aPTT 32  24 - 37 seconds  TROPONIN I     Status: None   Collection Time    03/10/13 11:33 AM      Result Value Range   Troponin I <0.30  <0.30 ng/mL   Comment:            Due to the release kinetics of cTnI,     a negative result within the first hours     of the onset of symptoms does not rule out     myocardial infarction with certainty.     If myocardial infarction is still suspected,     repeat the test at appropriate intervals.  PRO B NATRIURETIC PEPTIDE     Status: Abnormal   Collection Time    03/10/13 11:33 AM      Result Value Range   Pro B Natriuretic peptide (BNP) 1014.0 (*) 0 - 125 pg/mL  MAGNESIUM     Status: None   Collection Time    03/10/13 12:53 PM      Result Value Range   Magnesium 1.9  1.5 - 2.5 mg/dL  PHOSPHORUS     Status: None   Collection Time    03/10/13 12:53 PM      Result Value Range   Phosphorus 3.1  2.3 - 4.6 mg/dL  CORTISOL     Status: None   Collection Time    03/10/13 12:53 PM      Result Value Range   Cortisol, Plasma 33.2     Comment: (NOTE)     AM:  4.3 - 22.4 ug/dL     PM:  3.1 - 16.7 ug/dL     Performed at [employer]  POCT I-STAT 3, BLOOD GAS (G3P V)     Status: Abnormal   Collection Time    03/10/13  1:18 PM      Result Value Range   pH, Ven 7.411 (*) 7.250 - 7.300   pCO2, Ven 51.0 (*) 45.0 - 50.0 mmHg   pO2, Ven 110.0 (*) 30.0 - 45.0 mmHg   Bicarbonate 32.4 (*) 20.0 - 24.0 mEq/L   TCO2 34  0 - 100 mmol/L   O2 Saturation 98.0     Acid-Base Excess 7.0 (*) 0.0 - 2.0  mmol/L   Sample type VENOUS    URINALYSIS, ROUTINE W REFLEX MICROSCOPIC     Status: Abnormal   Collection Time    03/10/13  1:19 PM      Result Value Range   Color, Urine YELLOW  YELLOW   APPearance CLEAR  CLEAR   Specific Gravity, Urine 1.010  1.005 - 1.030   pH 6.5  5.0 - 8.0   Glucose, UA 250 (*) NEGATIVE mg/dL   Hgb urine dipstick TRACE (*) NEGATIVE   Bilirubin Urine NEGATIVE  NEGATIVE   Ketones, ur NEGATIVE  NEGATIVE mg/dL   Protein, ur NEGATIVE  NEGATIVE mg/dL   Urobilinogen, UA 0.2  0.0 - 1.0 mg/dL   Nitrite NEGATIVE  NEGATIVE   Leukocytes, UA NEGATIVE  NEGATIVE  URINE MICROSCOPIC-ADD ON     Status: None   Collection Time    03/10/13  1:19 PM      Result Value Range   Squamous Epithelial / LPF RARE  RARE   WBC, UA 0-2  <3 WBC/hpf   RBC / HPF 3-6  <3 RBC/hpf  MRSA PCR SCREENING     Status: None   Collection Time    03/10/13  2:57 PM      Result Value Range   MRSA by PCR NEGATIVE  NEGATIVE   Comment:            The GeneXpert MRSA Assay (FDA     approved for NASAL specimens     only), is one component of a     comprehensive MRSA colonization     surveillance program. It is not     intended to diagnose MRSA     infection nor to guide or     monitor treatment for     MRSA infections.  GLUCOSE, CAPILLARY     Status: Abnormal   Collection Time    03/10/13  4:49 PM      Result Value Range   Glucose-Capillary 308 (*) 70 - 99 mg/dL  LACTIC ACID, PLASMA     Status: Abnormal   Collection Time    03/10/13  5:00 PM      Result Value Range   Lactic Acid, Venous 2.3 (*) 0.5 - 2.2 mmol/L  GLUCOSE, CAPILLARY      Status: Abnormal   Collection Time    03/10/13  9:07 PM      Result Value Range   Glucose-Capillary 332 (*) 70 - 99 mg/dL  BASIC METABOLIC PANEL     Status: Abnormal   Collection Time    03/10/13 11:05 PM      Result Value Range   Sodium 135 (*) 137 - 147 mEq/L   Potassium 2.5 (*) 3.7 - 5.3 mEq/L   Comment: CRITICAL RESULT CALLED TO, READ BACK BY AND VERIFIED WITH:     ROBERTS,J RN 03/11/2013 0013 JORDANS     REPEATED TO VERIFY   Chloride 89 (*) 96 - 112 mEq/L   CO2 30  19 - 32 mEq/L   Glucose, Bld 378 (*) 70 - 99 mg/dL   BUN 17  6 - 23 mg/dL   Creatinine, Ser 1.14 (*) 0.50 - 1.10 mg/dL   Calcium 7.9 (*) 8.4 - 10.5 mg/dL   GFR calc non Af Amer 49 (*) >90 mL/min   GFR calc Af Amer 57 (*) >90 mL/min   Comment: (NOTE)     The eGFR has been calculated using the CKD EPI equation.     This calculation has not  been validated in all clinical situations.     eGFR's persistently <90 mL/min signify possible Chronic Kidney     Disease.  CBC     Status: Abnormal   Collection Time    03/11/13  3:23 AM      Result Value Range   WBC 10.6 (*) 4.0 - 10.5 K/uL   RBC 3.53 (*) 3.87 - 5.11 MIL/uL   Hemoglobin 8.0 (*) 12.0 - 15.0 g/dL   HCT 27.3 (*) 36.0 - 46.0 %   MCV 77.3 (*) 78.0 - 100.0 fL   MCH 22.7 (*) 26.0 - 34.0 pg   MCHC 29.3 (*) 30.0 - 36.0 g/dL   RDW 17.3 (*) 11.5 - 15.5 %   Platelets    150 - 400 K/uL   Value: PLATELET CLUMPS NOTED ON SMEAR, COUNT APPEARS DECREASED  BASIC METABOLIC PANEL     Status: Abnormal   Collection Time    03/11/13  3:23 AM      Result Value Range   Sodium 134 (*) 137 - 147 mEq/L   Potassium 2.5 (*) 3.7 - 5.3 mEq/L   Comment: CRITICAL RESULT CALLED TO, READ BACK BY AND VERIFIED WITH:     ROBERTS,J RN 03/11/2013 0442 JORDANS     REPEATED TO VERIFY   Chloride 89 (*) 96 - 112 mEq/L   CO2 33 (*) 19 - 32 mEq/L   Glucose, Bld 318 (*) 70 - 99 mg/dL   BUN 17  6 - 23 mg/dL   Creatinine, Ser 1.12 (*) 0.50 - 1.10 mg/dL   Calcium 7.9 (*) 8.4 - 10.5 mg/dL    GFR calc non Af Amer 50 (*) >90 mL/min   GFR calc Af Amer 58 (*) >90 mL/min   Comment: (NOTE)     The eGFR has been calculated using the CKD EPI equation.     This calculation has not been validated in all clinical situations.     eGFR's persistently <90 mL/min signify possible Chronic Kidney     Disease.  GLUCOSE, CAPILLARY     Status: Abnormal   Collection Time    03/11/13  7:50 AM      Result Value Range   Glucose-Capillary 339 (*) 70 - 99 mg/dL  GLUCOSE, CAPILLARY     Status: Abnormal   Collection Time    03/11/13 11:05 AM      Result Value Range   Glucose-Capillary 454 (*) 70 - 99 mg/dL  POTASSIUM     Status: Abnormal   Collection Time    03/11/13 12:35 PM      Result Value Range   Potassium 3.3 (*) 3.7 - 5.3 mEq/L   Comment: SLIGHT HEMOLYSIS  GLUCOSE, CAPILLARY     Status: Abnormal   Collection Time    03/11/13  3:40 PM      Result Value Range   Glucose-Capillary 497 (*) 70 - 99 mg/dL  BASIC METABOLIC PANEL     Status: Abnormal   Collection Time    03/11/13  8:30 PM      Result Value Range   Sodium 132 (*) 137 - 147 mEq/L   Potassium 2.8 (*) 3.7 - 5.3 mEq/L   Comment: CRITICAL RESULT CALLED TO, READ BACK BY AND VERIFIED WITH:     HORTON,A RN 03/11/2013 2128 JORDANS     REPEATED TO VERIFY   Chloride 83 (*) 96 - 112 mEq/L   CO2 35 (*) 19 - 32 mEq/L   Glucose, Bld 451 (*) 70 - 99 mg/dL  BUN 17  6 - 23 mg/dL   Creatinine, Ser 4.15 (*) 0.50 - 1.10 mg/dL   Calcium 8.2 (*) 8.4 - 10.5 mg/dL   GFR calc non Af Amer 48 (*) >90 mL/min   GFR calc Af Amer 56 (*) >90 mL/min   Comment: (NOTE)     The eGFR has been calculated using the CKD EPI equation.     This calculation has not been validated in all clinical situations.     eGFR's persistently <90 mL/min signify possible Chronic Kidney     Disease.  GLUCOSE, CAPILLARY     Status: Abnormal   Collection Time    03/11/13  9:59 PM      Result Value Range   Glucose-Capillary 413 (*) 70 - 99 mg/dL  BASIC METABOLIC PANEL      Status: Abnormal   Collection Time    03/12/13  3:35 AM      Result Value Range   Sodium 137  137 - 147 mEq/L   Potassium 2.6 (*) 3.7 - 5.3 mEq/L   Comment: CRITICAL RESULT CALLED TO, READ BACK BY AND VERIFIED WITH:     HORTON,A RN 03/12/2013 0519 JORDANS     REPEATED TO VERIFY   Chloride 87 (*) 96 - 112 mEq/L   CO2 38 (*) 19 - 32 mEq/L   Glucose, Bld 351 (*) 70 - 99 mg/dL   BUN 18  6 - 23 mg/dL   Creatinine, Ser 9.01 (*) 0.50 - 1.10 mg/dL   Calcium 8.4  8.4 - 72.4 mg/dL   GFR calc non Af Amer 51 (*) >90 mL/min   GFR calc Af Amer 59 (*) >90 mL/min   Comment: (NOTE)     The eGFR has been calculated using the CKD EPI equation.     This calculation has not been validated in all clinical situations.     eGFR's persistently <90 mL/min signify possible Chronic Kidney     Disease.  GLUCOSE, CAPILLARY     Status: Abnormal   Collection Time    03/12/13  8:12 AM      Result Value Range   Glucose-Capillary 273 (*) 70 - 99 mg/dL    Imaging: Imaging results have been reviewed and Dg Chest Port 1 View  03/10/2013   CLINICAL DATA:  Intubated  EXAM: PORTABLE CHEST - 1 VIEW  COMPARISON:  None.  FINDINGS: Tip of the endotracheal tube is just within the right mainstem bronchus. Recommend withdrawing 4 cm. There are diffuse bilateral interstitial and airspace opacities in predominantly perihilar and dependent distribution. The cardiopericardial silhouette is enlarged. The pulmonary vasculature is diffusely ill-defined consistent with edema. No acute osseous abnormality.  IMPRESSION: 1. The tip of the endotracheal tube is in the right mainstem bronchus. Recommend withdrawing 3-4 cm. This finding was called to the ER nurse taking care of the patient had noted that the tube has already been pulled back and a new chest x-ray is pending. 2. Diffuse bilateral interstitial and airspace opacities in a predominantly perihilar and bibasilar distribution. The imaging findings are most suggestive of acute pulmonary  edema. Additional less likely differential considerations include multifocal pneumonia in diffuse alveolar hemorrhage. 3. Mild enlargement of the cardiopericardial silhouette.   Electronically Signed   By: Malachy Moan M.D.   On: 03/10/2013 12:12    Assessment:  1. Principal Problem: 2.   Pulmonary edema, acute, with severe AS 3. Active Problems: 4.   Respiratory failure 5.   Aortic stenosis, severe,  peak velocities  436cm/s,  AVA 0.61cm^2 6.   COPD (chronic obstructive pulmonary disease) 7.   Hypothyroidism 8.   Anemia 9.   Palliative care patient 10.   Plan:  1. Palliative care for terminal valvular heart disease not amenable to surgical AVR or TAVR.  2. DNR, hospice care 3. Would not restart inotropes 4. DO NOT INTUBATE 5. DC home on Monday, when we can ensure seamless transition to palliative care  Time Spent Directly with Patient:  30 minutes  Length of Stay:  LOS: 2 days    Murvin Gift 03/12/2013, 10:42 AM

## 2013-03-12 NOTE — Clinical Social Work Note (Signed)
[facility]  LCSWA routine visit: Pt up in recliner alert and oriented able to communicate needs.  Pt states she was not feeling well due to her blood pressure being very low.  Husband Lorella Nimrod(Harvey) present on visit and states blood pressure is 88/22 which is very low for her although she has never had a normal blood pressure it is still very low for her.    Discharge date is uncertain at this time.  Husband states that the nurse was talking about discharging pt on Monday if she improves.    Discharge plan:  To return back home with spouse and [facility]. Date UNKNOWN at this time.  Primary team will follow up on Monday  Please contact [facility] if you have any questions, comments or concerns. [phone]  Jolyn Lenthanda Hall, MSW LCSWA [facility]

## 2013-03-12 NOTE — Progress Notes (Signed)
CRITICAL VALUE ALERT  Critical value received:  K 2.6  Date of notification:  03-12-13  Time of notification:  0526  Critical value read back:yes  Nurse who received alert:  Neville RouteAllison Horton, RN  MD notified (1st page):  McQuaid  Time of first page:  0527  MD notified (2nd page):  Time of second page:  Responding MD:  Kendrick FriesMcQuaid  Time MD responded:  [phone]

## 2013-03-12 NOTE — Progress Notes (Signed)
CRITICAL VALUE ALERT  Critical value received:  Potassium 2.8  Date of notification:  03/12/2013  Time of notification:  2145  Critical value read back:yes  Nurse who received alert:  Milta DeitersHANEY, Paitynn Mikus  MD notified (1st page):  Dr. Octavio GravesWerth  Time of first page:  2146  MD notified (2nd page):n/a  Time of second page:n/a  Responding MD:  Dr. Octavio GravesWerth  Time MD responded:  2147

## 2013-03-12 NOTE — Progress Notes (Signed)
Notified on-call doctor, Dr. Sherene SiresWert of patient having a severe headache, and feeling of nausea. Patient stated she had never felt like that before. Orders given for Zofran and was administered per PRN order. Will continue to monitor to end of shift.

## 2013-03-13 LAB — BASIC METABOLIC PANEL
BUN: 25 mg/dL — ABNORMAL HIGH (ref 6–23)
CHLORIDE: 85 meq/L — AB (ref 96–112)
CO2: 34 mEq/L — ABNORMAL HIGH (ref 19–32)
Calcium: 8.5 mg/dL (ref 8.4–10.5)
Creatinine, Ser: 1.3 mg/dL — ABNORMAL HIGH (ref 0.50–1.10)
GFR, EST AFRICAN AMERICAN: 48 mL/min — AB (ref >90)
GFR, EST NON AFRICAN AMERICAN: 42 mL/min — AB (ref >90)
Glucose, Bld: 277 mg/dL — ABNORMAL HIGH (ref 70–99)
POTASSIUM: 3.3 meq/L — AB (ref 3.7–5.3)
SODIUM: 131 meq/L — AB (ref 137–147)

## 2013-03-13 LAB — GLUCOSE, CAPILLARY
GLUCOSE-CAPILLARY: 439 mg/dL — AB (ref 70–99)
Glucose-Capillary: 280 mg/dL — ABNORMAL HIGH (ref 70–99)
Glucose-Capillary: 367 mg/dL — ABNORMAL HIGH (ref 70–99)
Glucose-Capillary: 472 mg/dL — ABNORMAL HIGH (ref 70–99)

## 2013-03-13 MED ORDER — INSULIN ASPART 100 UNIT/ML ~~LOC~~ SOLN
5.0000 [IU] | Freq: Once | SUBCUTANEOUS | Status: AC
  Administered 2013-03-13: 5 [IU] via SUBCUTANEOUS

## 2013-03-13 MED ORDER — INSULIN GLARGINE 100 UNIT/ML ~~LOC~~ SOLN
45.0000 [IU] | Freq: Two times a day (BID) | SUBCUTANEOUS | Status: DC
  Administered 2013-03-13 – 2013-03-15 (×4): 45 [IU] via SUBCUTANEOUS
  Filled 2013-03-13 (×5): qty 0.45

## 2013-03-13 MED ORDER — ENOXAPARIN SODIUM 40 MG/0.4ML ~~LOC~~ SOLN
40.0000 mg | SUBCUTANEOUS | Status: DC
  Administered 2013-03-14 – 2013-03-15 (×2): 40 mg via SUBCUTANEOUS
  Filled 2013-03-13 (×2): qty 0.4

## 2013-03-13 NOTE — Progress Notes (Signed)
PT. With elevated blood glucose level. On call MD for [facility] critical care made aware. Instructions to give 5u Novolog received. RN will recheck blood glucose and continue to monitor pt. For changes in condition. [name], Cheryll DessertKaren Cherrell

## 2013-03-13 NOTE — Progress Notes (Signed)
SUBJECTIVE:  No acute complaints.    PHYSICAL EXAM Filed Vitals:   03/12/13 1957 03/12/13 2115 03/13/13 0513 03/13/13 0830  BP: 117/58 102/40 98/75   Pulse: 103  98   Temp: 98.4 F (36.9 C)  98.2 F (36.8 C)   TempSrc: Oral  Oral   Resp: 18  18   Height:      Weight:   242 lb 4.8 oz (109.907 kg)   SpO2: 100%  98% 97%   General:  No distress Lungs:  Clear Heart:  RRR Extremities:  Mod edema with chronic venous stasis.   LABS:  Results for orders placed during the hospital encounter of 03/10/13 (from the past 24 hour(s))  GLUCOSE, CAPILLARY     Status: Abnormal   Collection Time    03/12/13 11:38 AM      Result Value Range   Glucose-Capillary 377 (*) 70 - 99 mg/dL  BASIC METABOLIC PANEL     Status: Abnormal   Collection Time    03/12/13 11:49 AM      Result Value Range   Sodium 136 (*) 137 - 147 mEq/L   Potassium 3.1 (*) 3.7 - 5.3 mEq/L   Chloride 86 (*) 96 - 112 mEq/L   CO2 38 (*) 19 - 32 mEq/L   Glucose, Bld 379 (*) 70 - 99 mg/dL   BUN 18  6 - 23 mg/dL   Creatinine, Ser 1.611.32 (*) 0.50 - 1.10 mg/dL   Calcium 8.3 (*) 8.4 - 10.5 mg/dL   GFR calc non Af Amer 41 (*) >90 mL/min   GFR calc Af Amer 48 (*) >90 mL/min  HEMOGLOBIN A1C     Status: Abnormal   Collection Time    03/12/13 11:49 AM      Result Value Range   Hemoglobin A1C 12.8 (*) <5.7 %   Mean Plasma Glucose 321 (*) <117 mg/dL  GLUCOSE, CAPILLARY     Status: Abnormal   Collection Time    03/12/13  4:07 PM      Result Value Range   Glucose-Capillary 385 (*) 70 - 99 mg/dL  GLUCOSE, CAPILLARY     Status: Abnormal   Collection Time    03/12/13  5:31 PM      Result Value Range   Glucose-Capillary 381 (*) 70 - 99 mg/dL   Comment 1 Documented in Chart     Comment 2 Notify RN    BASIC METABOLIC PANEL     Status: Abnormal   Collection Time    03/12/13  8:29 PM      Result Value Range   Sodium 133 (*) 137 - 147 mEq/L   Potassium 2.8 (*) 3.7 - 5.3 mEq/L   Chloride 85 (*) 96 - 112 mEq/L   CO2 35 (*) 19 -  32 mEq/L   Glucose, Bld 367 (*) 70 - 99 mg/dL   BUN 23  6 - 23 mg/dL   Creatinine, Ser 0.961.43 (*) 0.50 - 1.10 mg/dL   Calcium 8.2 (*) 8.4 - 10.5 mg/dL   GFR calc non Af Amer 37 (*) >90 mL/min   GFR calc Af Amer 43 (*) >90 mL/min  GLUCOSE, CAPILLARY     Status: Abnormal   Collection Time    03/12/13  9:39 PM      Result Value Range   Glucose-Capillary 320 (*) 70 - 99 mg/dL  BASIC METABOLIC PANEL     Status: Abnormal   Collection Time    03/13/13  6:03 AM  Result Value Range   Sodium 131 (*) 137 - 147 mEq/L   Potassium 3.3 (*) 3.7 - 5.3 mEq/L   Chloride 85 (*) 96 - 112 mEq/L   CO2 34 (*) 19 - 32 mEq/L   Glucose, Bld 277 (*) 70 - 99 mg/dL   BUN 25 (*) 6 - 23 mg/dL   Creatinine, Ser 1.61 (*) 0.50 - 1.10 mg/dL   Calcium 8.5  8.4 - 09.6 mg/dL   GFR calc non Af Amer 42 (*) >90 mL/min   GFR calc Af Amer 48 (*) >90 mL/min  GLUCOSE, CAPILLARY     Status: Abnormal   Collection Time    03/13/13  6:07 AM      Result Value Range   Glucose-Capillary 280 (*) 70 - 99 mg/dL   Comment 1 Notify RN      Intake/Output Summary (Last 24 hours) at 03/13/13 0924 Last data filed at 03/13/13 0900  Gross per 24 hour  Intake   1080 ml  Output   1750 ml  Net   -670 ml    ASSESSMENT AND PLAN:  Pulmonary edema with AS:  Palliative care and medical management.  No plan for intervention.  Home tomorrow.   Breathing better currently.  No change in therapy.  Fayrene Fearing Ladaisha Portillo 03/13/2013 9:24 AM

## 2013-03-13 NOTE — Progress Notes (Signed)
[place] PCCM    Name: [name] GibbonsStella [name] MRN: [id number] DOB: 1946-01-16    ADMISSION DATE:  03/10/2013  REFERRING MD :  EDP PRIMARY SERVICE: PCCM  CHIEF COMPLAINT:  Resp failure  BRIEF PATIENT DESCRIPTION:  [age] yowf with end stage aortic valve disease, DNR who was intubated in field despite DNR. [facility] and PCCM called to admit.  SIGNIFICANT EVENTS / STUDIES:  1-15 intubated, then extubated per pt wishes. Admitted to CCU 1/16 Awake, alert, no distress, no complaint. Remains on low dose phenylephrine to maintain MAP > 65 mmHg. 1/17 off neo, sig hypokalemia, tx floor   LINES / TUBES: 1-15 ETT >> 1/15  CULTURES:   ANTIBIOTICS: Cipro 1/15 >> 1/16  SUBJECTIVE:  Awake, alert "when can I start my morphine drip".    VITAL SIGNS: Temp:  [98.2 F (36.8 C)-99 F (37.2 C)] 98.4 F (36.9 C) (01/18 0931) Pulse Rate:  [66-103] 66 (01/18 0931) Resp:  [15-20] 18 (01/18 0931) BP: (82-117)/(23-75) 108/42 mmHg (01/18 0931) SpO2:  [88 %-100 %] 100 % (01/18 0931) Weight:  [241 lb 11.2 oz (109.634 kg)-242 lb 4.8 oz (109.907 kg)] 242 lb 4.8 oz (109.907 kg) (01/18 0513) HEMODYNAMICS:   VENTILATOR SETTINGS:   INTAKE / OUTPUT: Intake/Output     01/17 0701 - 01/18 0700 01/18 0701 - 01/19 0700   P.O. 960 360   I.V. (mL/kg)     IV Piggyback     Total Intake(mL/kg) 960 (8.7) 360 (3.3)   Urine (mL/kg/hr) 1700 (0.6) 300 (0.4)   Total Output 1700 300   Net -740 +60          PHYSICAL EXAMINATION: General: NAD Neuro: No focal deficits HEENT:  WNL Cardiovascular: Regular, + syst M Lungs:  resps even non labored on [place], very min Bibasilar crackles Abdomen: Obese + bs Ext: symmetric LE edema   LABS:  Recent Labs Lab 03/10/13 1132 03/11/13 0323  HGB 8.2* 8.0*  HCT 27.7* 27.3*  WBC 10.3 10.6*  PLT 70* PLATELET CLUMPS NOTED ON SMEAR, COUNT APPEARS DECREASED    Recent Labs Lab 03/10/13 1132 03/10/13 1253  03/11/13 2030 03/12/13 0335 03/12/13 1149 03/12/13 2029  03/13/13 0603  NA 135*  --   < > 132* 137 136* 133* 131*  K 2.5*  --   < > 2.8* 2.6* 3.1* 2.8* 3.3*  CL 92*  --   < > 83* 87* 86* 85* 85*  CO2 29  --   < > 35* 38* 38* 35* 34*  GLUCOSE 333*  --   < > 451* 351* 379* 367* 277*  BUN 19  --   < > 17 18 18 23  25*  CREATININE 1.26*  --   < > 1.15* 1.11* 1.32* 1.43* 1.30*  CALCIUM 7.8*  --   < > 8.2* 8.4 8.3* 8.2* 8.5  MG  --  1.9  --   --   --   --   --   --   PHOS  --  3.1  --   --   --   --   --   --   < > = values in this interval not displayed.    Recent Labs Lab 03/10/13 1133  TROPONINI <0.30    Recent Labs Lab 03/10/13 1132  AST 34  ALT 18  ALKPHOS 147*  BILITOT 1.0  PROT 6.5  ALBUMIN 2.4*  INR 1.24    No results found.   ASSESSMENT Acute resp failure due to acute pulmonary  edema Severe aortic stenosis - not a surgical candidate Cardiogenic shock, resolving Hypokalemia DM II Hospice patient with well defined DNR/DNI status prior to admission   PLAN   Replete K+ -- on scheduled K to pair with zaroxolyn, demadex Close f/u bmet  Cont SSI, increased lantus 1/17 comfort if resp distress recurs - clearly no need for MS drip now Anticipate DC home 1/19 Hospice already arranged at home     Sandrea Hughs, MD Pulmonary and Critical Care Medicine [facility] Cell [phone] After 5:30 PM or weekends, call [phone]

## 2013-03-13 NOTE — Progress Notes (Signed)
GIP Visit-MC 3E21 [name] GibbonsStella Lack [facility] Erin Osborne RN  Patient sitting on the side of the bed finishing lunch. Husband at bedside. patient is still planned for discharge tomorrow. Called [facility] to order a new BSC per husband. Patient is planned to leave before lunch tomorrow and husband will be at home waiting on her.   Please call [facility] @ [phone]- ask for RN Liaison or after hours,ask for on-call RN with any hospice needs.  Thank you. Ulis RiasErin Osborne, RN  [phone])

## 2013-03-14 DIAGNOSIS — K5900 Constipation, unspecified: Secondary | ICD-10-CM

## 2013-03-14 DIAGNOSIS — R4182 Altered mental status, unspecified: Secondary | ICD-10-CM

## 2013-03-14 DIAGNOSIS — E039 Hypothyroidism, unspecified: Secondary | ICD-10-CM

## 2013-03-14 LAB — GLUCOSE, CAPILLARY
GLUCOSE-CAPILLARY: 417 mg/dL — AB (ref 70–99)
GLUCOSE-CAPILLARY: 221 mg/dL — AB (ref 70–99)
Glucose-Capillary: 264 mg/dL — ABNORMAL HIGH (ref 70–99)
Glucose-Capillary: 365 mg/dL — ABNORMAL HIGH (ref 70–99)
Glucose-Capillary: 280 mg/dL — ABNORMAL HIGH (ref 70–99)

## 2013-03-14 LAB — AMMONIA: Ammonia: 50 umol/L (ref 11–60)

## 2013-03-14 MED ORDER — INSULIN GLARGINE 100 UNIT/ML ~~LOC~~ SOLN: 45.0000 [IU] | Freq: Two times a day (BID) | SUBCUTANEOUS | Status: AC

## 2013-03-14 MED ORDER — DIPHENHYDRAMINE HCL 25 MG PO CAPS
25.0000 mg | ORAL_CAPSULE | Freq: Four times a day (QID) | ORAL | Status: DC | PRN
  Administered 2013-03-14: 25 mg via ORAL
  Filled 2013-03-14: qty 1

## 2013-03-14 MED ORDER — LACTULOSE 10 GM/15ML PO SOLN
30.0000 g | Freq: Every day | ORAL | Status: DC | PRN
  Administered 2013-03-14: 30 g via ORAL
  Filled 2013-03-14 (×2): qty 45

## 2013-03-14 MED ORDER — INSULIN ASPART 100 UNIT/ML ~~LOC~~ SOLN
5.0000 [IU] | Freq: Once | SUBCUTANEOUS | Status: AC
  Administered 2013-03-14: 5 [IU] via SUBCUTANEOUS

## 2013-03-14 MED ORDER — LACTULOSE 10 GM/15ML PO SOLN
30.0000 g | Freq: Once | ORAL | Status: DC
  Filled 2013-03-14: qty 45

## 2013-03-14 NOTE — Progress Notes (Signed)
[place] PCCM    Name: [name] GibbonsStella Hougland MRN: [id number] DOB: [birth year]    ADMISSION DATE:  03/10/2013  REFERRING MD :  EDP  CHIEF COMPLAINT:  Resp failure  BRIEF PATIENT DESCRIPTION:  [age] yowf with end stage aortic valve disease, DNR who was intubated in field despite DNR. [facility] and PCCM called to admit.  SIGNIFICANT EVENTS / STUDIES:  1/15 intubated, then extubated per pt wishes, admitted to CCU, cardiology consulted 1/16 remains on phenylephrine 1/17 off pressors, transfer to medical floor bed  LINES / TUBES: 1/15 ETT >> 1/15  CULTURES:   ANTIBIOTICS: Cipro 1/15 >> 1/16  SUBJECTIVE:  Awake, alert "when can I start my morphine drip".  Husband concerned about pt's mental status, and that she has not had bowel movement.  VITAL SIGNS: Temp:  [98 F (36.7 C)-98.6 F (37 C)] 98 F (36.7 C) (01/19 0458) Pulse Rate:  [90-98] 98 (01/19 0458) Resp:  [18-19] 19 (01/19 0458) BP: (87-112)/(27-37) 106/32 mmHg (01/19 0813) SpO2:  [90 %-100 %] 99 % (01/19 1002) Weight:  [248 lb 14.4 oz (112.9 kg)] 248 lb 14.4 oz (112.9 kg) (01/19 0458) 2 liters [place]  INTAKE / OUTPUT: Intake/Output     01/18 0701 - 01/19 0700 01/19 0701 - 01/20 0700   P.O. 600 220   Total Intake(mL/kg) 600 (5.3) 220 (1.9)   Urine (mL/kg/hr) 1800 (0.7)    Total Output 1800     Net -1200 +220          PHYSICAL EXAMINATION: General: ill appearing Neuro: confused HEENT: no sinus tenderness Cardiovascular: regular, 3/6 SM Lungs: no wheeze Abdomen: soft Ext: 1+ edema  LABS: CBC No results found for this basename: WBC, HGB, HCT, PLT,  in the last 72 hours  BMET Recent Labs     03/12/13  1149  03/12/13  2029  03/13/13  0603  NA  136*  133*  131*  K  3.1*  2.8*  3.3*  CL  86*  85*  85*  CO2  38*  35*  34*  BUN  18  23  25*  CREATININE  1.32*  1.43*  1.30*  GLUCOSE  379*  367*  277*    Electrolytes Recent Labs     03/12/13  1149  03/12/13  2029  03/13/13  0603  CALCIUM  8.3*   8.2*  8.5   Glucose Recent Labs     03/13/13  1127  03/13/13  1614  03/13/13  2125  03/14/13  0114  03/14/13  0606  03/14/13  1105  GLUCAP  367*  439*  472*  417*  264*  365*   Imaging No results found.    ASSESSMENT Acute respiratory failure due to acute pulmonary edema Severe aortic stenosis - not a surgical candidate Cardiogenic shock, resolved Hypokalemia DM II Hospice patient with well defined DNR/DNI status prior to admission Hx of COPD Hx of hypothyroidism Altered mental status Constipation  PLAN Continue coreg, zaroxolyn, demadex Check ammonia level, and give lactulose Monitor mental status Continue SSI Continue synthroid Continue scheduled duonebs  Defer hospital discharge for today until mental status better, and pt has bowel movement.  Coralyn HellingVineet Hashem Goynes, MD [facility] Pulmonary/Critical Care 03/14/2013, 1:26 PM Pager:  [phone] After 3pm call: [phone]

## 2013-03-14 NOTE — Progress Notes (Signed)
Room 3E 21- Marvetta GibbonsStella Schembri -  [facility] RN Visit-R.Nahom Carfagno RN  [facility].  Pt is DNR code.    Pt drowsy and disoriented, sitting up off side of bed and wavering with drowsiness.  Pt removing 02 tubing and talking disorientedly.   Husband present and unsure of planned discharge at this time.  Pt has not had BM since prior to admission and husband feels ammonia level is high and the cause of her current state.   Discussed above with staff RN Brayton CavesJessie who will contact MD re: BM meds and drawing ammonia level.  Staff CNA assisted to move pt to chair for safety.  Husband feels he cannot care for her at home in this shape and if discharged feels he would have to bring her back for care.  Staff RN will keep in touch with this Clinical research associatewriter.    Patient's home medication list is on shadow chart.   Please call [facility] @ [phone]- ask for RN Liaison or after hours,ask for on-call RN with any hospice needs.   Thank you.  Joneen Boersose B Ayva Veilleux, RN  [facility]  Hospice Liaison  [phone])

## 2013-03-14 NOTE — Discharge Summary (Signed)
Physician Discharge Summary  Patient ID: [name] MRN: [id number] DOB/AGE: 1945-09-12 68 y.o.  Admit date: 03/10/2013 Discharge date: 03/15/2013  Problem List Principal Problem:   Pulmonary edema, acute, with severe AS Active Problems:   Respiratory failure   Aortic stenosis, severe,  peak velocities 436cm/s,  AVA 0.61cm^2   COPD (chronic obstructive pulmonary disease)   Hypothyroidism   Anemia   Palliative care patient  HPI: 68 yo female with end stage aortic valve disease, DNR who was intubated in field despite DNR. Transported to [facility] ED and PCCM called to admit. She is followed by [facility] cards for her end stage aortic valve. Following discussions with family we will admit x 24 hours, use low dose dopamine and lasix and attempt to extubate with in 24 hours.     Hospital Course: SIGNIFICANT EVENTS / STUDIES:  1-15 intubated, then extubated per pt wishes. Admitted to CCU  1/16 Awake, alert, no distress, no complaint. Remains on low dose phenylephrine to maintain MAP > 65 mmHg.  1/17 off neo, sig hypokalemia, tx floor  1-20 dc home LINES / TUBES:  1-15 ETT >> 1/15  CULTURES:  ANTIBIOTICS:  Cipro 1/15 >> 1/16 Restart at home  ASSESSMENT  Acute resp failure due to acute pulmonary edema  Severe aortic stenosis - not a surgical candidate  Cardiogenic shock, resolving  Hypokalemia  DM II  Hospice patient with well defined DNR/DNI status prior to admission  Recent UTI resume cipro on discharge. PLAN  Replete K+ -- on scheduled K to pair with zaroxolyn, demadex  Close f/u bmet  Cont SSI, increased lantus 1/17  comfort if resp distress recurs - clearly no need for MS drip now  Anticipate DC home 1/20 Hospice already arranged at home       Labs at discharge Lab Results  Component Value Date   CREATININE 1.30* 03/13/2013   BUN 25* 03/13/2013   NA 131* 03/13/2013   K 3.3* 03/13/2013   CL 85* 03/13/2013   CO2 34* 03/13/2013   Lab Results  Component Value Date   WBC  10.6* 03/11/2013   HGB 8.0* 03/11/2013   HCT 27.3* 03/11/2013   MCV 77.3* 03/11/2013   PLT PLATELET CLUMPS NOTED ON SMEAR, COUNT APPEARS DECREASED 03/11/2013   Lab Results  Component Value Date   ALT 18 03/10/2013   AST 34 03/10/2013   ALKPHOS 147* 03/10/2013   BILITOT 1.0 03/10/2013   Lab Results  Component Value Date   INR 1.24 03/10/2013   CBG (last 3)   Recent Labs  03/14/13 1623 03/14/13 2116 03/15/13 0633  GLUCAP 280* 221* 251*     Current radiology studies No results found.  Disposition:  Final discharge disposition not confirmed      Discharge Orders   Future Appointments Provider Department Dept Phone   03/21/2013 9:15 AM Julio Sicks, NP [facility] [phone]   Future Orders Complete By Expires   Discharge patient  As directed    Comments:     May travel via ambulance       Medication List         acetaminophen 325 MG tablet  Commonly known as:  TYLENOL  Take 650 mg by mouth every 4 (four) hours as needed (for fever over 100.8).     ALPRAZolam 1 MG tablet  Commonly known as:  XANAX  Take 1 mg by mouth 3 (three) times daily as needed for anxiety.     bisacodyl 10 MG suppository  Commonly known as:  DULCOLAX  Place 10 mg rectally daily as needed (to help with constipation).     carvedilol 12.5 MG tablet  Commonly known as:  COREG  Take 12.5 mg by mouth 2 (two) times daily with a meal.     ciprofloxacin 500 MG tablet  Commonly known as:  CIPRO  Take 500 mg by mouth 2 (two) times daily. Starting on 03/09/13; for 10 days     famotidine 20 MG tablet  Commonly known as:  PEPCID  Take 20 mg by mouth 2 (two) times daily.     hydrocerin Crea  Apply 1 application topically daily.     insulin aspart 100 UNIT/ML injection  Commonly known as:  novoLOG  - Inject 6-14 Units into the skin 4 (four) times daily -  with meals and at bedtime. If blood sugar = 200-300 give 6 units  - 300-400 = give 10 units  - If greater than 400 = give 14  units     insulin glargine 100 UNIT/ML injection  Commonly known as:  LANTUS  Inject 0.45 mLs (45 Units total) into the skin 2 (two) times daily.     ipratropium-albuterol 0.5-2.5 (3) MG/3ML Soln  Commonly known as:  DUONEB  Take 3 mLs by nebulization 2 (two) times daily.     levothyroxine 137 MCG tablet  Commonly known as:  SYNTHROID, LEVOTHROID  Take 137 mcg by mouth daily.     magnesium hydroxide 400 MG/5ML suspension  Commonly known as:  MILK OF MAGNESIA  Take 30 mLs by mouth daily as needed (to promote bowel movement as needed).     metolazone 2.5 MG tablet  Commonly known as:  ZAROXOLYN  Take 2.5 mg by mouth 2 (two) times a week. Mondays and Thursdays     nitroGLYCERIN 0.4 MG SL tablet  Commonly known as:  NITROSTAT  Place 0.4 mg under the tongue every 5 (five) minutes as needed for chest pain.     OVER THE COUNTER MEDICATION  Take 1 tablet by mouth daily as needed (for constipation). Medication: "laxative of choice"     OXYCONTIN 20 mg T12a 12 hr tablet  Generic drug:  OxyCODONE  Take 20 mg by mouth every 12 (twelve) hours.     oxyCODONE 5 MG immediate release tablet  Commonly known as:  Oxy IR/ROXICODONE  Take 5 mg by mouth every 6 (six) hours as needed (for breakthrough pain).     potassium chloride SA 20 MEQ tablet  Commonly known as:  K-DUR,KLOR-CON  Take 60 mEq by mouth 2 (two) times daily.     prochlorperazine 10 MG tablet  Commonly known as:  COMPAZINE  Take 10 mg by mouth every 6 (six) hours as needed for nausea or vomiting.     torsemide 20 MG tablet  Commonly known as:  DEMADEX  Take 40 mg by mouth 2 (two) times daily.       Follow-up Information   Follow up with PARRETT,TAMMY, NP On 03/21/2013. (9:15 am)    Specialty:  Nurse Practitioner   Contact information:   [address] [place] [address] [phone]        Discharged Condition: poor  Time spent on discharge greater than 40 minutes.  Vital signs at Discharge. Temp:   [98.6 F (37 C)-99 F (37.2 C)] 98.6 F (37 C) (01/20 0829) Pulse Rate:  [98-106] 100 (01/20 0829) Resp:  [18-22] 22 (01/20 0829) BP: (100-137)/(33-119) 137/119 mmHg (01/20 0829) SpO2:  [90 %-  97 %] 96 % (01/20 0929) Weight:  [245 lb 9.5 oz (111.4 kg)] 245 lb 9.5 oz (111.4 kg) (01/20 0622)  Office follow up Special Information or instructions. She is a full DNR on home hospice care. She will resume cipro for recent UTI. Note on discharge she had mild confusion that has improved over last 24 hours.  Signed: Brett Canales Minor ACNP [facility] Pager [phone] till 3 pm If no answer page [phone] 03/15/2013, 10:02 AM   Coralyn Helling, MD [facility] Pulmonary/Critical Care 03/15/2013, 11:25 AM Pager:  [phone] After 3pm call: [phone]

## 2013-03-15 ENCOUNTER — Encounter (HOSPITAL_COMMUNITY): Payer: Self-pay | Admitting: Emergency Medicine

## 2013-03-15 LAB — GLUCOSE, CAPILLARY: GLUCOSE-CAPILLARY: 251 mg/dL — AB (ref 70–99)

## 2013-03-15 MED ORDER — LACTULOSE 10 GM/15ML PO SOLN: 30.0000 g | Freq: Every day | ORAL | Status: AC | PRN

## 2013-03-15 MED ORDER — DIPHENHYDRAMINE HCL 25 MG PO CAPS: 25.0000 mg | ORAL_CAPSULE | Freq: Four times a day (QID) | ORAL | Status: AC | PRN

## 2013-03-15 NOTE — Progress Notes (Signed)
Pt being dc to home with hospice, pt and husband given dc instructions, follow up appointments and medications reviewed, pt and husband verbalized understanding, pt leaving via ambulance, pt stable

## 2013-03-15 NOTE — Progress Notes (Signed)
    Subjective:  Sitting on side of bed, eating full breakfast. Not complaining of shortness of breath. No CP.   Objective:  Vital Signs in the last 24 hours: Temp:  [98.6 F (37 C)-99 F (37.2 C)] 99 F (37.2 C) (01/20 0622) Pulse Rate:  [98-106] 106 (01/20 0622) Resp:  [18-22] 22 (01/20 0622) BP: (100-116)/(33-43) 100/33 mmHg (01/20 0622) SpO2:  [90 %-99 %] 93 % (01/20 0622) Weight:  [245 lb 9.5 oz (111.4 kg)] 245 lb 9.5 oz (111.4 kg) (01/20 0622)  Intake/Output from previous day: 01/19 0701 - 01/20 0700 In: 340 [P.O.:340] Out: 1900 [Urine:1900]   Physical Exam: General: Well developed, well nourished, in no acute distress. Head:  Normocephalic and atraumatic. Lungs: Clear to auscultation and percussion. Heart: Normal S1 and S2.  3/6 SEM no rubs or gallops.  Abdomen: soft, non-tender, positive bowel sounds. Extremities: Chronic venous stasis changes with chronic edema. Neurologic: Alert and oriented x 3.    Lab Results:   Recent Labs  03/12/13 2029 03/13/13 0603  NA 133* 131*  K 2.8* 3.3*  CL 85* 85*  CO2 35* 34*  GLUCOSE 367* 277*  BUN 23 25*  CREATININE 1.43* 1.30*   Cardiac Studies:  Severe AS  Assessment/Plan:  Principal Problem:   Pulmonary edema, acute, with severe AS Active Problems:   Respiratory failure   Aortic stenosis, severe,  peak velocities 436cm/s,  AVA 0.61cm^2   COPD (chronic obstructive pulmonary disease)   Hypothyroidism   Anemia   Palliative care patient  1) Severe aortic stenosis - palliative care, Hospice.  Seems to be more oriented this am when compared to noon yesterday when husband was unsure about his ability to care for her at home. Non surgical candidate. Continue with torsemide and occasional metolazone.   2) Cardiogenic shock - resolved  -Consider hospice discharge today.   3)   Jaylon Boylen 03/15/2013, 8:18 AM

## 2013-03-15 NOTE — Progress Notes (Signed)
Room 3W21  [name] GibbonsStella Barz- [facility] RN Visit-R.Shaquille Murdy RN  [facility]   Pt is DNR code.    Pt alert & oriented today, much improved over yesterday.  Pt has had a large BM.  Pt sitting up off side of bed, keeping her balance nicely.  Pt has no complaints of pain or discomfort.   Husband and student RN present. Patient's home medication list is on shadow chart.   Pt is slated to be discharged this am. - [facility] has been called.  Husband advised he has prescription of lactulose in the home and will continue administering to pt every other day as prior to admission. Husband states he does NOT have any liquid medication in the home in case of crisis as which caused this admission.  Will request PCP to assess for liquid MS or other med for respiratory distress.   He requests a different BSC - as the one in the home has no cover/top-this RN will contact [facility] DME specialist for assistance.  Pt's PCP Marletta LorJulie Barr NP has been called and message left on her cell re: discharge today prior to noon, medication issues.  Husband had no other needs or concerns at this time.    Please call [facility] @ [phone]- ask for RN Liaison or after hours,ask for on-call RN with any hospice needs.   Thank you.  Joneen Boersose B Jayson Waterhouse, RN  [facility]  Hospice Liaison  [phone])

## 2013-03-21 ENCOUNTER — Inpatient Hospital Stay: Payer: Medicare Other | Admitting: Adult Health

## 2013-04-01 ENCOUNTER — Telehealth: Payer: Self-pay

## 2013-04-01 NOTE — Telephone Encounter (Signed)
Patient past away per Obituary in [place] [employer] °

## 2013-04-24 DEATH — deceased

## 2015-05-17 IMAGING — CR DG FOOT COMPLETE 3+V*R*
3 series · 3 of 3 positions shown · non-contrast
Comparison: None.

CLINICAL DATA: Lower leg pain, swelling and erythema extending into
the foot for 3 weeks.

RIGHT FOOT COMPLETE - 3+ VIEW

[x foot ap right (1 of 2)]
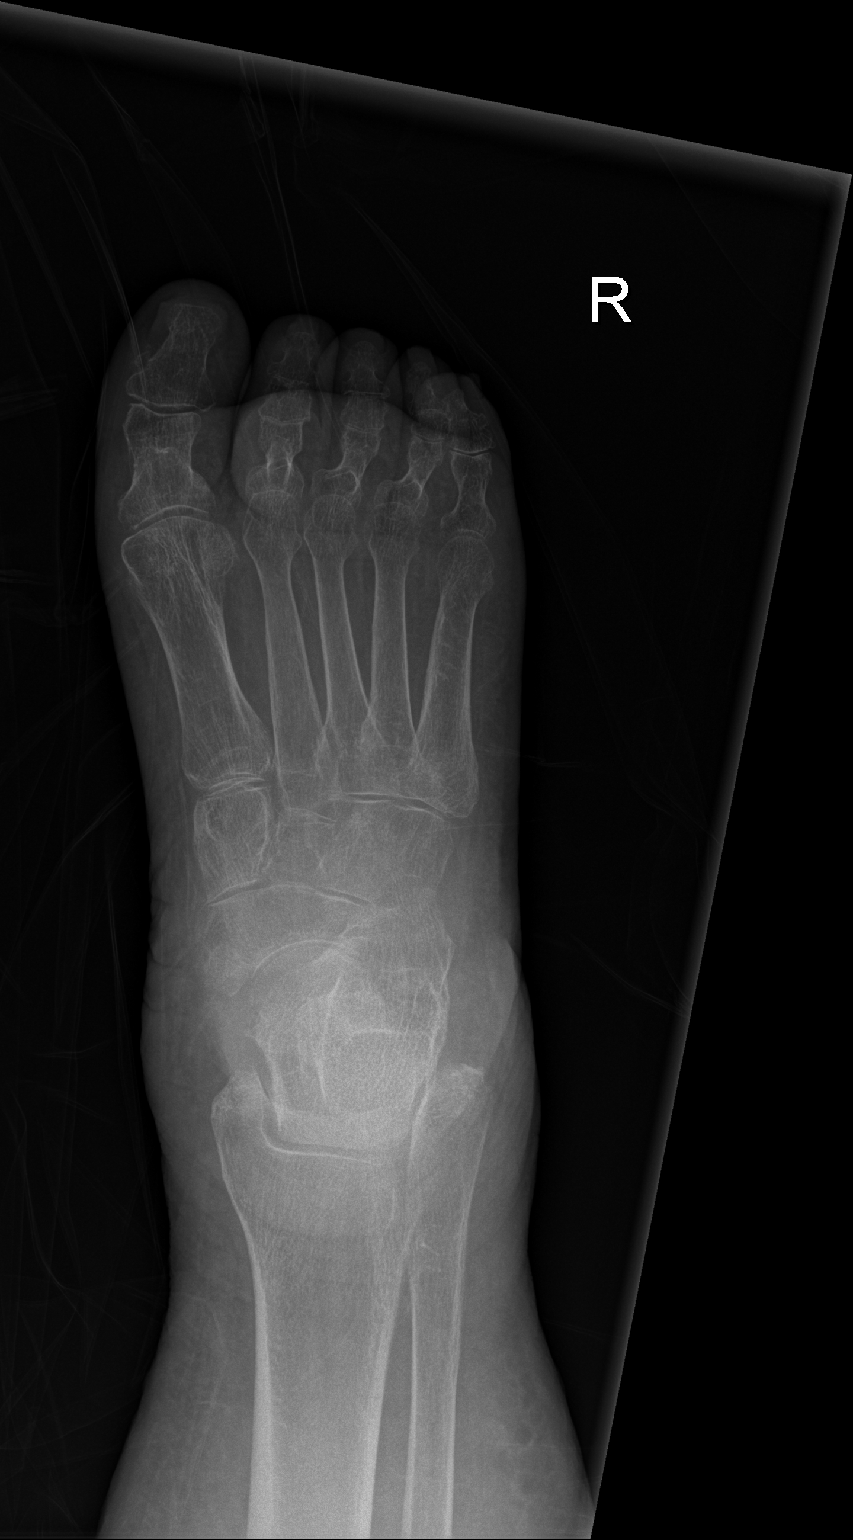

[x foot ap right (2 of 2)]
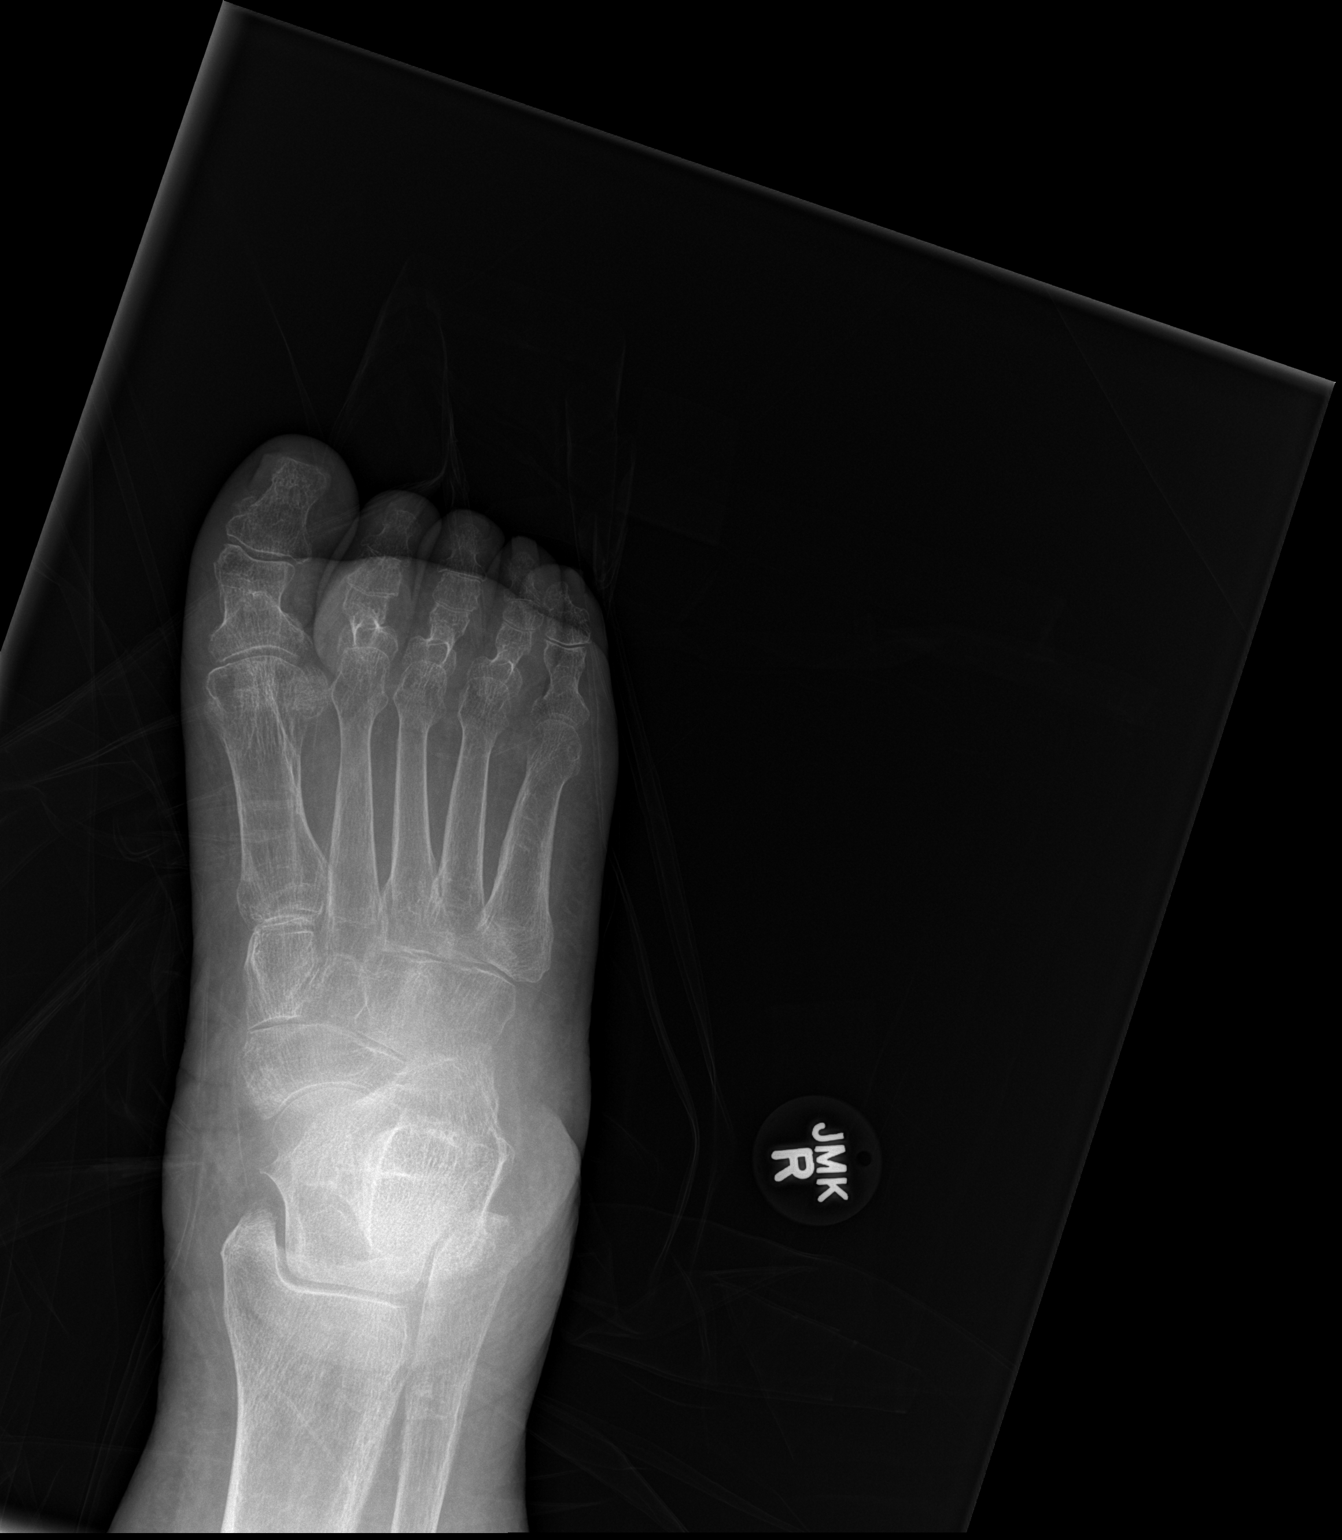

[x foot lat right]
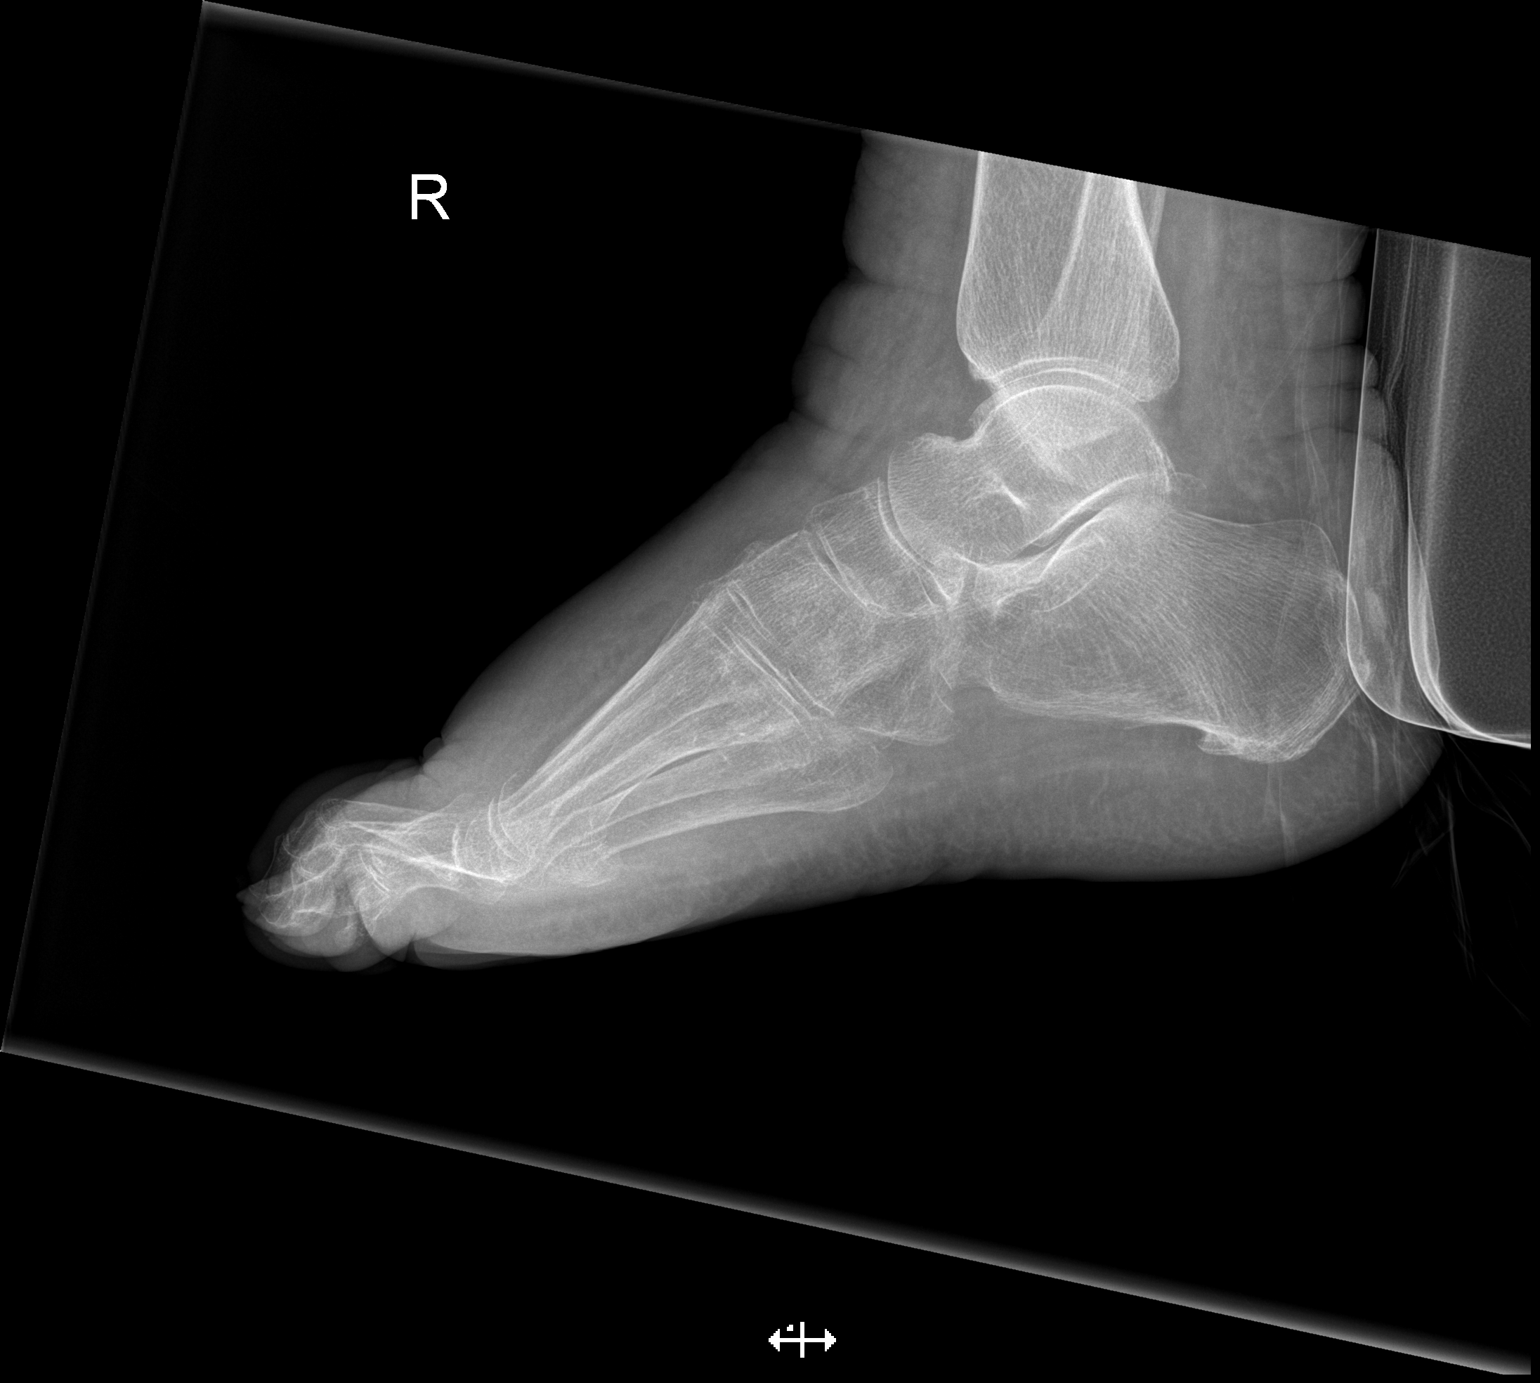

[3 of 3 positions shown; findings below may reference images not displayed]

FINDINGS: Within the foot, the bones appear mildly demineralized.
There is no evidence of acute fracture, dislocation or bone
destruction.  The soft tissues appear diffusely prominent,
especially in the dorsum of the forefoot.  There is no evidence of
foreign body or soft tissue emphysema.
IMPRESSION: Diffuse soft tissue prominence suspicious for cellulitis in this
clinical context.  No evidence of foreign body or osteomyelitis.

## 2015-05-17 IMAGING — CR DG TIBIA/FIBULA 2V*R*
4 series · 4 of 4 positions shown · non-contrast
Comparison: Right knee MRI 10/31/2007.

CLINICAL DATA: Lower leg pain, swelling and erythema extending into
the foot for 3 weeks.

RIGHT TIBIA AND FIBULA - 2 VIEW

[x tib-fib ap right (1 of 2)]
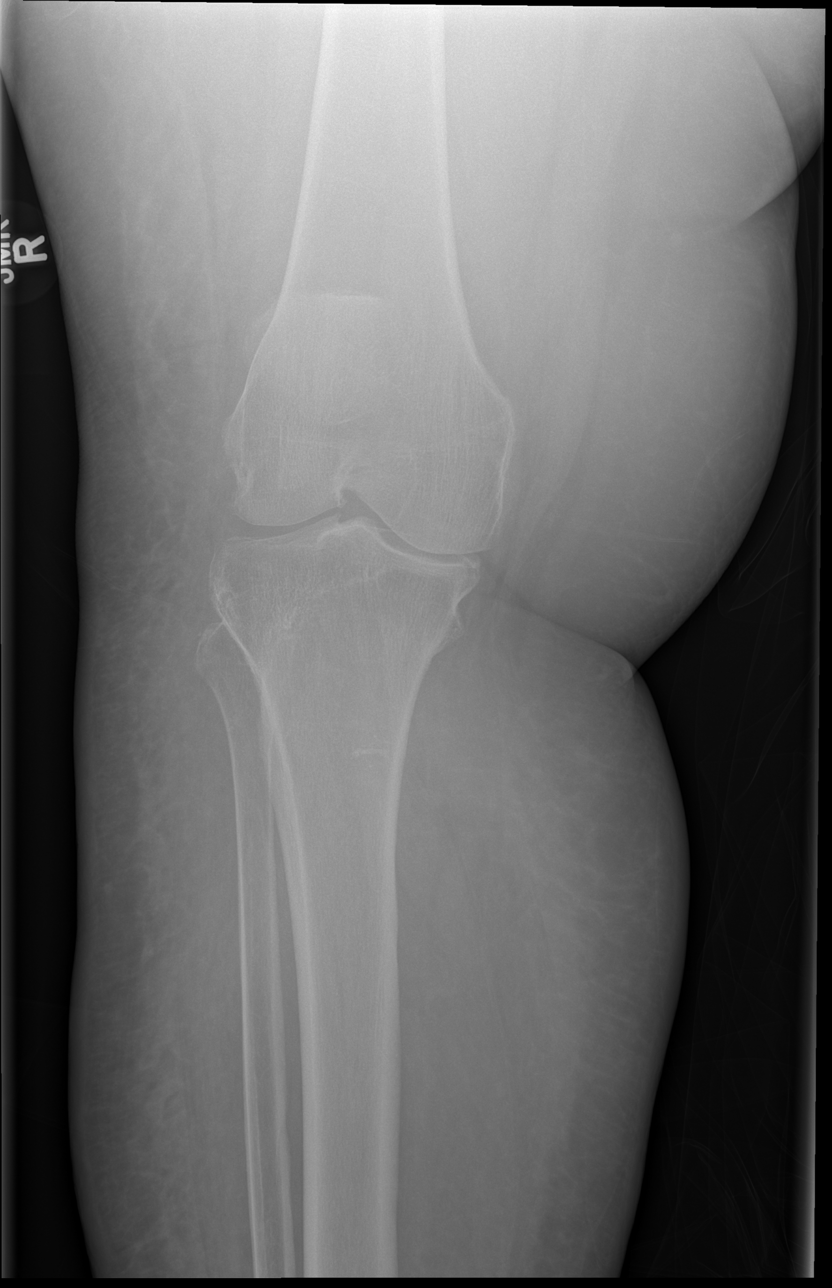

[x tib-fib ap right (2 of 2)]
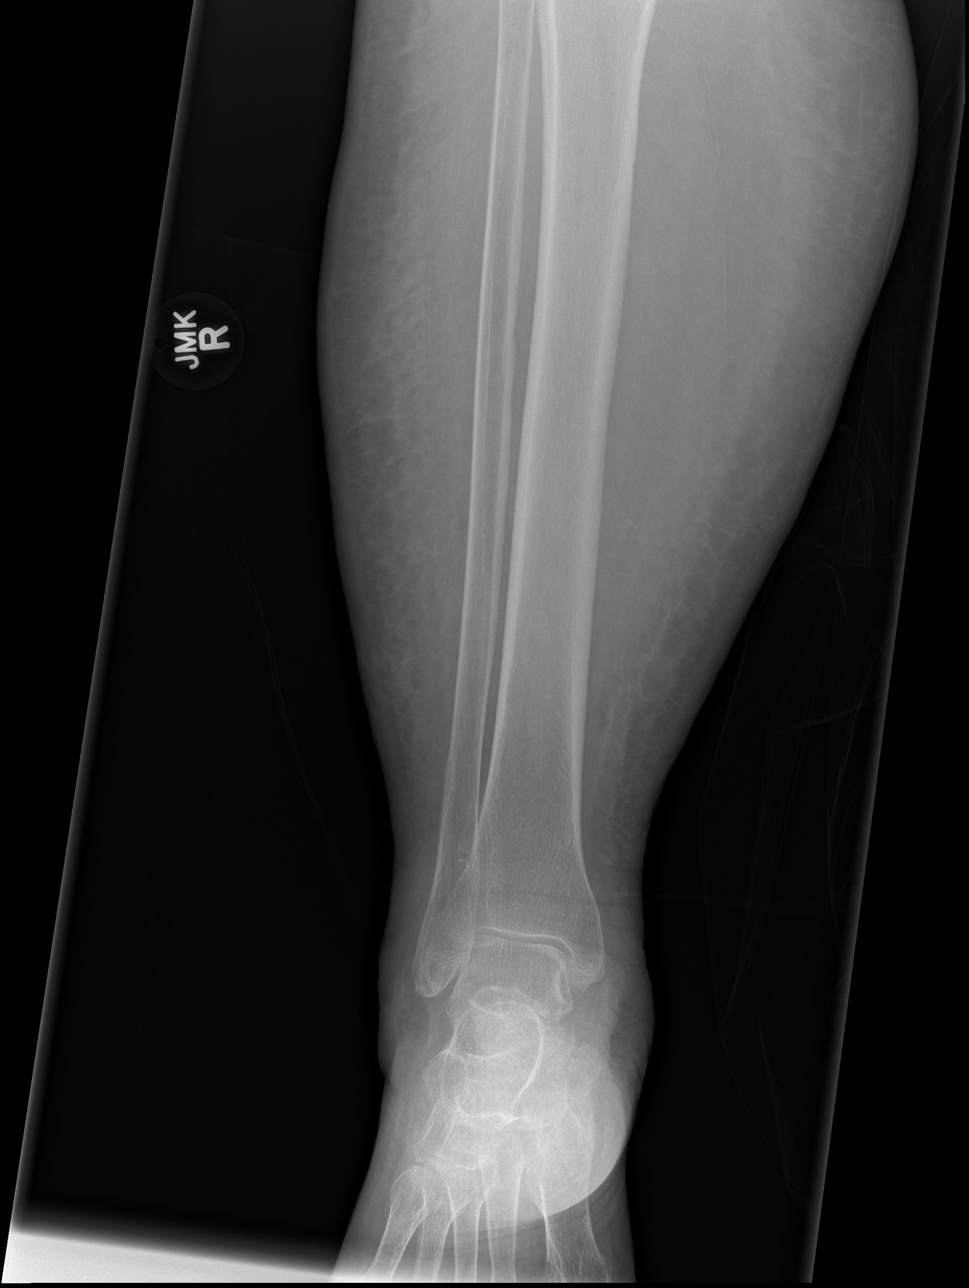

[x tib-fib lat right (1 of 2)]
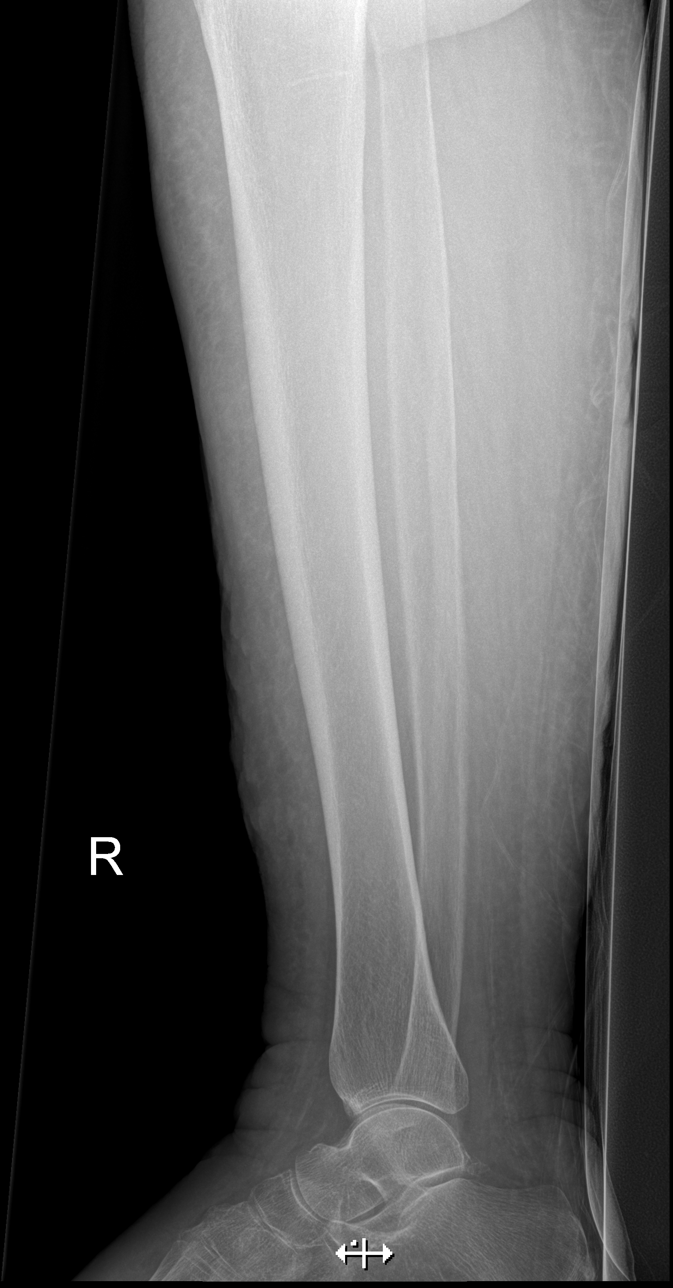

[x tib-fib lat right (2 of 2)]
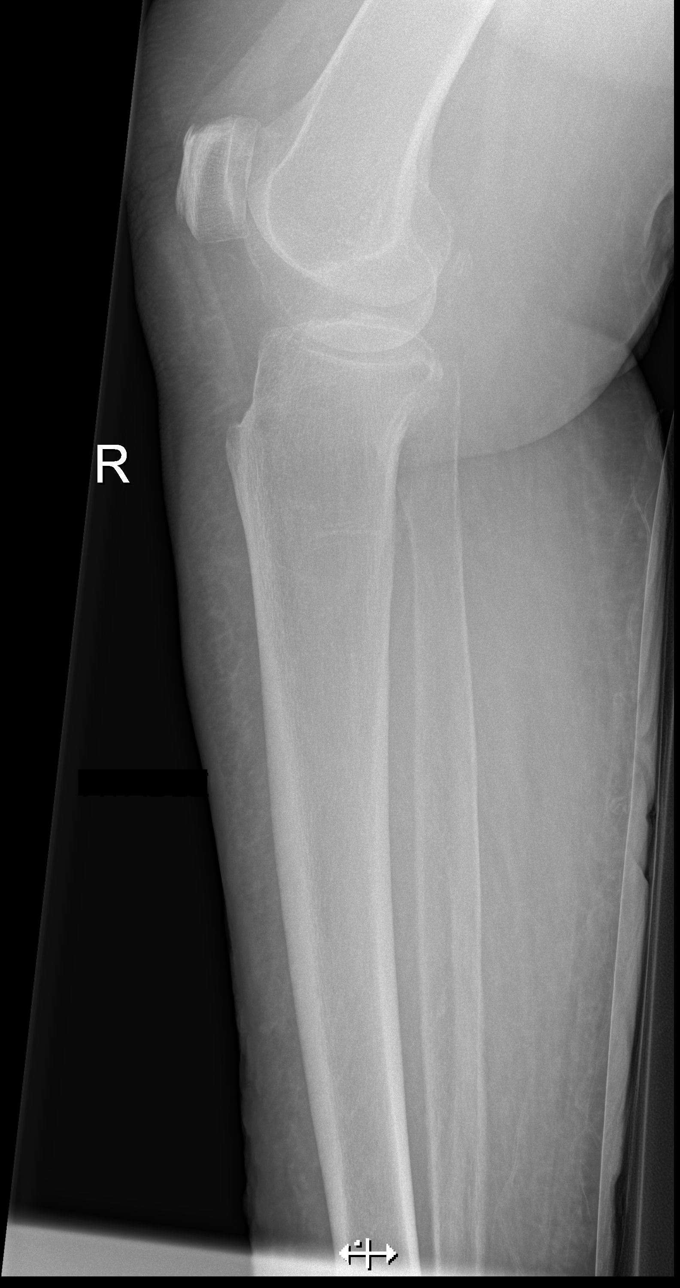

[4 of 4 positions shown; findings below may reference images not displayed]

FINDINGS: The mineralization and alignment are normal.  There is no
evidence of acute fracture, dislocation or bone destruction.  There
is mildly progressive medial joint space loss at the knee.  The
soft tissues of the lower leg are diffusely prominent with
suspected generalized subcutaneous edema.  There is no evidence of
foreign body or soft tissue emphysema.
IMPRESSION: Suspected diffuse lower leg soft tissue edema suggestive of
cellulitis in this clinical context.  No soft tissue emphysema,
foreign body or osseous abnormality identified.
